# Patient Record
Sex: Female | Born: 1985
Health system: Southern US, Community
[De-identification: ages and names within clinical notes are randomized; demographics above are authoritative.]

## PROBLEM LIST (undated history)

## (undated) ENCOUNTER — Inpatient Hospital Stay (HOSPITAL_COMMUNITY): Payer: Self-pay

## (undated) DIAGNOSIS — R87629 Unspecified abnormal cytological findings in specimens from vagina: Secondary | ICD-10-CM

## (undated) DIAGNOSIS — K5900 Constipation, unspecified: Secondary | ICD-10-CM

## (undated) DIAGNOSIS — E119 Type 2 diabetes mellitus without complications: Secondary | ICD-10-CM

## (undated) DIAGNOSIS — F419 Anxiety disorder, unspecified: Secondary | ICD-10-CM

## (undated) DIAGNOSIS — G8929 Other chronic pain: Secondary | ICD-10-CM

## (undated) DIAGNOSIS — F32A Depression, unspecified: Secondary | ICD-10-CM

## (undated) DIAGNOSIS — E282 Polycystic ovarian syndrome: Secondary | ICD-10-CM

## (undated) DIAGNOSIS — O24419 Gestational diabetes mellitus in pregnancy, unspecified control: Secondary | ICD-10-CM

## (undated) DIAGNOSIS — M2669 Other specified disorders of temporomandibular joint: Secondary | ICD-10-CM

## (undated) DIAGNOSIS — K8020 Calculus of gallbladder without cholecystitis without obstruction: Secondary | ICD-10-CM

## (undated) DIAGNOSIS — N812 Incomplete uterovaginal prolapse: Secondary | ICD-10-CM

## (undated) DIAGNOSIS — R519 Headache, unspecified: Secondary | ICD-10-CM

## (undated) DIAGNOSIS — F329 Major depressive disorder, single episode, unspecified: Secondary | ICD-10-CM

## (undated) DIAGNOSIS — N83209 Unspecified ovarian cyst, unspecified side: Secondary | ICD-10-CM

## (undated) DIAGNOSIS — R51 Headache: Secondary | ICD-10-CM

## (undated) DIAGNOSIS — L0591 Pilonidal cyst without abscess: Secondary | ICD-10-CM

## (undated) DIAGNOSIS — T8859XA Other complications of anesthesia, initial encounter: Secondary | ICD-10-CM

## (undated) DIAGNOSIS — B977 Papillomavirus as the cause of diseases classified elsewhere: Secondary | ICD-10-CM

## (undated) DIAGNOSIS — T4145XA Adverse effect of unspecified anesthetic, initial encounter: Secondary | ICD-10-CM

## (undated) DIAGNOSIS — K219 Gastro-esophageal reflux disease without esophagitis: Secondary | ICD-10-CM

## (undated) DIAGNOSIS — J302 Other seasonal allergic rhinitis: Secondary | ICD-10-CM

## (undated) HISTORY — DX: Depression, unspecified: F32.A

## (undated) HISTORY — DX: Headache: R51

## (undated) HISTORY — PX: WISDOM TOOTH EXTRACTION: SHX21

## (undated) HISTORY — DX: Papillomavirus as the cause of diseases classified elsewhere: B97.7

## (undated) HISTORY — DX: Major depressive disorder, single episode, unspecified: F32.9

## (undated) HISTORY — DX: Headache, unspecified: R51.9

## (undated) HISTORY — DX: Other chronic pain: G89.29

## (undated) HISTORY — DX: Gestational diabetes mellitus in pregnancy, unspecified control: O24.419

## (undated) HISTORY — DX: Other complications of anesthesia, initial encounter: T88.59XA

## (undated) HISTORY — DX: Unspecified abnormal cytological findings in specimens from vagina: R87.629

## (undated) HISTORY — DX: Adverse effect of unspecified anesthetic, initial encounter: T41.45XA

---
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
## 2007-04-19 ENCOUNTER — Emergency Department (HOSPITAL_COMMUNITY): Admission: EM | Admit: 2007-04-19 | Discharge: 2007-04-19 | Payer: Self-pay | Admitting: Emergency Medicine

## 2007-04-22 ENCOUNTER — Emergency Department (HOSPITAL_COMMUNITY): Admission: EM | Admit: 2007-04-22 | Discharge: 2007-04-22 | Payer: Self-pay | Admitting: Emergency Medicine

## 2007-09-20 ENCOUNTER — Emergency Department (HOSPITAL_COMMUNITY): Admission: EM | Admit: 2007-09-20 | Discharge: 2007-09-20 | Payer: Self-pay | Admitting: Emergency Medicine

## 2008-09-14 ENCOUNTER — Other Ambulatory Visit: Admission: RE | Admit: 2008-09-14 | Discharge: 2008-09-14 | Payer: Self-pay | Admitting: Family Medicine

## 2009-09-19 ENCOUNTER — Other Ambulatory Visit: Admission: RE | Admit: 2009-09-19 | Discharge: 2009-09-19 | Payer: Self-pay | Admitting: Family Medicine

## 2010-11-12 ENCOUNTER — Emergency Department (HOSPITAL_COMMUNITY): Payer: Worker's Compensation

## 2010-11-12 ENCOUNTER — Emergency Department (HOSPITAL_COMMUNITY)
Admission: EM | Admit: 2010-11-12 | Discharge: 2010-11-12 | Disposition: A | Discharge status: Home / Self Care | Payer: Worker's Compensation | Attending: Emergency Medicine | Admitting: Emergency Medicine

## 2010-11-12 DIAGNOSIS — M25519 Pain in unspecified shoulder: Secondary | ICD-10-CM | POA: Insufficient documentation

## 2010-11-12 DIAGNOSIS — Y9210 Unspecified residential institution as the place of occurrence of the external cause: Secondary | ICD-10-CM | POA: Insufficient documentation

## 2010-11-12 DIAGNOSIS — X500XXA Overexertion from strenuous movement or load, initial encounter: Secondary | ICD-10-CM | POA: Insufficient documentation

## 2010-11-12 DIAGNOSIS — Y990 Civilian activity done for income or pay: Secondary | ICD-10-CM | POA: Insufficient documentation

## 2010-11-12 DIAGNOSIS — IMO0002 Reserved for concepts with insufficient information to code with codable children: Secondary | ICD-10-CM | POA: Insufficient documentation

## 2011-11-18 ENCOUNTER — Encounter (HOSPITAL_COMMUNITY): Payer: Self-pay | Admitting: Emergency Medicine

## 2011-11-18 ENCOUNTER — Emergency Department (HOSPITAL_COMMUNITY)
Admission: EM | Admit: 2011-11-18 | Discharge: 2011-11-18 | Disposition: A | Discharge status: Home / Self Care | Payer: Managed Care, Other (non HMO) | Attending: Emergency Medicine | Admitting: Emergency Medicine

## 2011-11-18 DIAGNOSIS — H6980 Other specified disorders of Eustachian tube, unspecified ear: Secondary | ICD-10-CM

## 2011-11-18 DIAGNOSIS — H6990 Unspecified Eustachian tube disorder, unspecified ear: Secondary | ICD-10-CM | POA: Insufficient documentation

## 2011-11-18 DIAGNOSIS — H9190 Unspecified hearing loss, unspecified ear: Secondary | ICD-10-CM | POA: Insufficient documentation

## 2011-11-18 DIAGNOSIS — F17200 Nicotine dependence, unspecified, uncomplicated: Secondary | ICD-10-CM | POA: Insufficient documentation

## 2011-11-18 MED ORDER — PREDNISONE 50 MG PO TABS: 50.0000 mg | ORAL_TABLET | Freq: Every day | ORAL | Status: AC

## 2011-11-18 MED ORDER — BUDESONIDE 32 MCG/ACT NA SUSP: 2.0000 | Freq: Every day | NASAL | Status: DC

## 2011-11-18 MED ORDER — FEXOFENADINE-PSEUDOEPHED ER 60-120 MG PO TB12: 1.0000 | ORAL_TABLET | Freq: Two times a day (BID) | ORAL | Status: DC

## 2011-11-18 NOTE — Discharge Instructions (Signed)
Increase her fluid intake. followup with the ENT doctor as needed.

## 2011-11-18 NOTE — ED Notes (Signed)
Loss of hearing right ear x 2 wks, and left ear is now painful and able to only hear an echo, onset couple days ago.

## 2011-11-18 NOTE — ED Provider Notes (Signed)
History     CSN: [id number]  Arrival date & time 11/18/11  1111   First MD Initiated Contact with Patient 11/18/11 1144      Chief Complaint  Patient presents with  . Hearing Loss    (Consider location/radiation/quality/duration/timing/severity/associated sxs/prior treatment) HPI The patient has emergency department with decreased hearing in her right ear x2 weeks.  Patient, states that now her left ear starting to bother her somewhat she feels, like that the hearing is decreased in that ear as well.  Patient, states, that she has not had a cough, runny nose, sore throat, fever, nausea, vomiting, dizziness, or facial weakness.  Patient, states, that she has not tried any medications for this.  Patient does take Zyrtec for allergies.  History reviewed. No pertinent past medical history.  History reviewed. No pertinent past surgical history.  No family history on file.  History  Substance Use Topics  . Smoking status: Current Some Day Smoker  . Smokeless tobacco: Never Used  . Alcohol Use: No    OB History    Grav Para Term Preterm Abortions TAB SAB Ect Mult Living                  Review of Systems All other systems negative except as documented in the HPI. All pertinent positives and negatives as reviewed in the HPI.  Allergies  Review of patient's allergies indicates no known allergies.  Home Medications   Current Outpatient Rx  Name Route Sig Dispense Refill  . CETIRIZINE HCL 10 MG PO TABS Oral Take 10 mg by mouth daily as needed. For allergies.      BP 121/70  Pulse 98  Temp 98.5 F (36.9 C) (Oral)  Resp 18  SpO2 100%  Physical Exam  Nursing note and vitals reviewed. Constitutional: She is oriented to person, place, and time. She appears well-developed and well-nourished. No distress.  HENT:  Right Ear: External ear normal. Tympanic membrane is not injected, not scarred, not perforated, not erythematous, not retracted and not bulging. A middle ear  effusion is present.  Left Ear: External ear normal. Tympanic membrane is not injected, not scarred, not perforated, not erythematous, not retracted and not bulging. A middle ear effusion is present.  Mouth/Throat: Oropharynx is clear and moist.  Eyes: Pupils are equal, round, and reactive to light.  Neck: Normal range of motion. Neck supple.  Cardiovascular: Normal rate and regular rhythm.   Pulmonary/Chest: Effort normal and breath sounds normal. No respiratory distress.  Lymphadenopathy:    She has no cervical adenopathy.  Neurological: She is alert and oriented to person, place, and time.  Skin: Skin is warm and dry. No rash noted.    ED Course  Procedures (including critical care time)  Feel that the patient has eustachian tube dysfunction, based on her history of present illness and physical exam.  Gross hearing is intact.  Patient will be referred to ENT as needed.  She is told to return to the emergency department as needed.  We will give her treatment for this.  MDM          Carlyle Dolly, PA-C 11/18/11 1213

## 2011-11-18 NOTE — ED Provider Notes (Signed)
Medical screening examination/treatment/procedure(s) were performed by non-physician practitioner and as supervising physician I was immediately available for consultation/collaboration.   Dublin Cantero, MD 11/18/11 1455 

## 2011-12-25 ENCOUNTER — Emergency Department (HOSPITAL_COMMUNITY)
Admission: EM | Admit: 2011-12-25 | Discharge: 2011-12-25 | Disposition: A | Discharge status: Home / Self Care | Payer: Managed Care, Other (non HMO) | Attending: Emergency Medicine | Admitting: Emergency Medicine

## 2011-12-25 ENCOUNTER — Emergency Department (HOSPITAL_COMMUNITY): Payer: Managed Care, Other (non HMO)

## 2011-12-25 DIAGNOSIS — L0291 Cutaneous abscess, unspecified: Secondary | ICD-10-CM | POA: Insufficient documentation

## 2011-12-25 DIAGNOSIS — L0390 Cellulitis, unspecified: Secondary | ICD-10-CM

## 2011-12-25 DIAGNOSIS — R109 Unspecified abdominal pain: Secondary | ICD-10-CM | POA: Insufficient documentation

## 2011-12-25 LAB — POCT I-STAT, CHEM 8
BUN: 7 mg/dL (ref 6–23)
Calcium, Ion: 1.21 mmol/L (ref 1.12–1.23)
Chloride: 107 mEq/L (ref 96–112)
Glucose, Bld: 83 mg/dL (ref 70–99)

## 2011-12-25 MED ORDER — IOHEXOL 300 MG/ML  SOLN
100.0000 mL | Freq: Once | INTRAMUSCULAR | Status: AC | PRN
  Administered 2011-12-25: 100 mL via INTRAVENOUS

## 2011-12-25 MED ORDER — OXYCODONE-ACETAMINOPHEN 5-325 MG PO TABS
2.0000 | ORAL_TABLET | Freq: Once | ORAL | Status: AC
  Administered 2011-12-25: 2 via ORAL
  Filled 2011-12-25: qty 2

## 2011-12-25 MED ORDER — CEFAZOLIN SODIUM 1-5 GM-% IV SOLN
1.0000 g | Freq: Once | INTRAVENOUS | Status: AC
  Administered 2011-12-25: 1 g via INTRAVENOUS
  Filled 2011-12-25: qty 50

## 2011-12-25 MED ORDER — CEPHALEXIN 500 MG PO CAPS: 500.0000 mg | ORAL_CAPSULE | Freq: Four times a day (QID) | ORAL | Status: AC

## 2011-12-25 MED ORDER — OXYCODONE-ACETAMINOPHEN 5-325 MG PO TABS: ORAL_TABLET | ORAL | Status: DC

## 2011-12-25 NOTE — ED Provider Notes (Signed)
History     CSN: [id number]  Arrival date & time 12/25/11  0941   First MD Initiated Contact with Patient 12/25/11 1016      Chief Complaint  Patient presents with  . Abdominal Pain    (Consider location/radiation/quality/duration/timing/severity/associated sxs/prior treatment) HPI  26 year old female in no acute distress complaining of pain is to gluteal cleft over the course of the month pain receded first week of the month and worsened over the course of the last several days.  She states the skin is hardened over the gluteal cleft she pressed on the area earlier in the month and clear fluid was produced. Patient denies prior episodes or fever.  No past medical history on file.  No past surgical history on file.  No family history on file.  History  Substance Use Topics  . Smoking status: Current Some Day Smoker  . Smokeless tobacco: Never Used  . Alcohol Use: No    OB History    Grav Para Term Preterm Abortions TAB SAB Ect Mult Living                  Review of Systems  Constitutional: Negative for fever.  All other systems reviewed and are negative.    Allergies  Orange syrup  Home Medications   Current Outpatient Rx  Name Route Sig Dispense Refill  . CETIRIZINE HCL 10 MG PO TABS Oral Take 10 mg by mouth daily as needed. For allergies.    Marland Kitchen FEXOFENADINE-PSEUDOEPHED ER 60-120 MG PO TB12 Oral Take 1 tablet by mouth every 12 (twelve) hours. 30 tablet 0  . BUDESONIDE 32 MCG/ACT NA SUSP Nasal Place 2 sprays into the nose daily. 1 Bottle 0    BP 111/75  Pulse 102  Temp 98.7 F (37.1 C) (Oral)  Resp 18  SpO2 99%  Physical Exam  Nursing note and vitals reviewed. Constitutional: She is oriented to person, place, and time. She appears well-developed and well-nourished. No distress.  HENT:  Head: Normocephalic.  Eyes: Conjunctivae and EOM are normal.  Cardiovascular: Normal rate.   Pulmonary/Chest: Effort normal.  Abdominal: Soft. Bowel sounds are  normal. She exhibits no distension and no mass. There is no tenderness. There is no rebound and no guarding.  Musculoskeletal: Normal range of motion.  Neurological: She is alert and oriented to person, place, and time.  Skin:       Patient is very tender to palpation of the upper gluteal cleft no fluctuance appreciated, mild induration to the left side of cleft.  Psychiatric: She has a normal mood and affect.    ED Course  Procedures (including critical care time)   Labs Reviewed  POCT I-STAT, CHEM 8  POCT PREGNANCY, URINE   Ct Pelvis W Contrast  12/25/2011  *RADIOLOGY REPORT*  Clinical Data:  Gluteal cleft pain for 1 month.  CT PELVIS WITH CONTRAST  Technique:  Multidetector CT imaging of the pelvis was performed using the standard protocol following the bolus administration of intravenous contrast.  Contrast: OMNIPAQUE IOHEXOL 300 MG/ML  SOLN  Comparison:   None.  Findings:  Soft tissue stranding in the subcutaneous fat at the level of the superior aspect of the gluteal cleft.  This area measures 7.6 x 4.9 cm in maximum dimensions on image number 16 and approximately 9 cm in length on sagittal image number 55.  This is slightly more confluent just inferior to the top of the cleft without a well-defined fluid collection.  The uterus, ovaries, urinary  bladder and bowel loops are unremarkable.  No free peritoneal fluid.  Normal appearing bones.  IMPRESSION: Findings compatible with cellulitis in the subcutaneous fat at the superior aspect of the gluteal cleft without a well-defined abscess.  Original Report Authenticated By: Darrol Angel, M.D.     1. Cellulitis       MDM  Note the patient has not had any abdominal pain and contrast chief complaint. Patient presents for pain to the gluteal cleft. No abscess or induration appreciated on physical exam however patient is very tender to palpation of the area. I'll send for CT scan with IV contrast to evaluate for deeper abscess.  CT  scan shows findings consistent with cellulitis in the subcutaneous fat of the affected area. I will d/c her with keflex and percocet and strict return precautions   Shared visit with attending Dr. Tarri Abernethy.     Wynetta Emery, PA-C 12/25/11 1130  Hanz Winterhalter, PA-C 12/25/11 1553

## 2011-12-25 NOTE — ED Provider Notes (Signed)
Medical screening examination/treatment/procedure(s) were conducted as a shared visit with non-physician practitioner(s) and myself.  I personally evaluated the patient during the encounter  Doug Sou, MD 12/25/11 [phone]

## 2011-12-25 NOTE — ED Notes (Signed)
Patient transported to CT 

## 2011-12-25 NOTE — ED Notes (Signed)
The initial assesment is placed on the wrong person this pt is here for abd pain and cyst pain.

## 2011-12-25 NOTE — Progress Notes (Signed)
Pt confirms she had been with [facility] since 2010 but recently informed she was dismissed from practice for missed appointments She was referred to [facility] and has an appointment coming up soon but can not recall the name of the provider.  CM and pt reviewed how to make sure provider is in network with her insurance carrier Counselling psychologist)

## 2011-12-25 NOTE — ED Notes (Signed)
Pt returned from CT °

## 2011-12-25 NOTE — ED Notes (Signed)
Pt verifies she will not drive after percocet is given. Pt states father lives down the road and will come get her. Father's name, Donni Minor.

## 2011-12-25 NOTE — ED Provider Notes (Signed)
Complains of pain at gluteal cleft pilonidal area for approximately 4 months worse over the past 4 days pain is worse with bowel movements denies abdominal pain denies fever On exam alert nontoxic patient tender over pilonidal area no fluctuance no perirectal abscess noted abdomen obese nontender.  Doug Sou, MD 12/25/11 [phone]

## 2011-12-27 ENCOUNTER — Encounter (HOSPITAL_COMMUNITY): Payer: Self-pay | Admitting: Emergency Medicine

## 2011-12-27 ENCOUNTER — Emergency Department (HOSPITAL_COMMUNITY)
Admission: EM | Admit: 2011-12-27 | Discharge: 2011-12-27 | Disposition: A | Discharge status: Home / Self Care | Payer: Managed Care, Other (non HMO) | Attending: Emergency Medicine | Admitting: Emergency Medicine

## 2011-12-27 DIAGNOSIS — L0501 Pilonidal cyst with abscess: Secondary | ICD-10-CM

## 2011-12-27 MED ORDER — OXYCODONE-ACETAMINOPHEN 5-325 MG PO TABS: 1.0000 | ORAL_TABLET | ORAL | Status: AC | PRN

## 2011-12-27 MED ORDER — OXYCODONE-ACETAMINOPHEN 5-325 MG PO TABS
2.0000 | ORAL_TABLET | Freq: Once | ORAL | Status: AC
  Administered 2011-12-27: 2 via ORAL
  Filled 2011-12-27: qty 2

## 2011-12-27 MED ORDER — SULFAMETHOXAZOLE-TRIMETHOPRIM 800-160 MG PO TABS: 2.0000 | ORAL_TABLET | Freq: Two times a day (BID) | ORAL | Status: AC

## 2011-12-27 NOTE — ED Notes (Signed)
Pt reports a diagnosis of cellulitis in coccyx area. Increase swelling and pain today

## 2011-12-27 NOTE — ED Provider Notes (Signed)
History     CSN: [id number]  Arrival date & time 12/27/11  0940   First MD Initiated Contact with Patient 12/27/11 1007      Chief Complaint  Patient presents with  . Back Pain    Pain in coccyx, possible cellulitis    (Consider location/radiation/quality/duration/timing/severity/associated sxs/prior treatment) The history is provided by the patient.   26 year old healthy obese female presents to the emergency department with a chief complaint of worsening pain and swelling to the gluteal cleft. She notes that this pain is chronic her last several months but has worsened in the last 6 days. Associated increased swelling. She denies fever, chills, drainage from the area. Positive nausea, related to severe pain. No emesis or abdominal pain. Was seen in the emergency department for similar complaint 2 days ago and had a thorough evaluation to include a CT scan of the pelvis that demonstrated no discernible fluid collection. She was sent home on Keflex for cellulitis.  History reviewed. No pertinent past medical history.  Past Surgical History  Procedure Date  . Dental surgery     Family History  Problem Relation Age of Onset  . Hypertension Mother   . Cancer Mother   . Hyperlipidemia Mother   . Diabetes Mother   . Thyroid disease Mother   . Cancer Father   . Diabetes Father   . Hypertension Father     History  Substance Use Topics  . Smoking status: Current Some Day Smoker    Types: Cigarettes  . Smokeless tobacco: Never Used  . Alcohol Use: No     Review of Systems Pertinent positives and negatives are reviewed in the history of present illness. Allergies  Orange syrup  Home Medications   Current Outpatient Rx  Name Route Sig Dispense Refill  . CEPHALEXIN 500 MG PO CAPS Oral Take 1 capsule (500 mg total) by mouth 4 (four) times daily. 20 capsule 0  . FEXOFENADINE-PSEUDOEPHED ER 60-120 MG PO TB12 Oral Take 1 tablet by mouth every 12 (twelve) hours.    .  OXYCODONE-ACETAMINOPHEN 5-325 MG PO TABS Oral Take 1 tablet by mouth every 6 (six) hours as needed. pain      BP 118/71  Pulse 99  Temp 98.2 F (36.8 C) (Oral)  Resp 18  SpO2 100%  Physical Exam  Nursing note reviewed. Constitutional: She appears well-developed and well-nourished.       Signs reviewed and are normal. Specifically, the patient is afebrile. She is uncomfortable appearing.  HENT:  Head: Normocephalic and atraumatic.       Oromucosa moist  Eyes: Conjunctivae are normal.  Neck: Neck supple.  Cardiovascular: Normal rate and regular rhythm.   Pulmonary/Chest: Effort normal. No respiratory distress.  Abdominal: Soft. Bowel sounds are normal. She exhibits no distension. There is no tenderness. There is no rebound and no guarding.  Musculoskeletal: She exhibits no edema.       No pain to palpation of the entire midline spine.  Neurological: She is alert.  Skin: Skin is warm and dry.     Psychiatric: She has a normal mood and affect.    ED Course  INCISION AND DRAINAGE Performed by: Lorenz Coaster JUSTICE Authorized by: Shaaron Adler Consent: Verbal consent obtained. Risks and benefits: risks, benefits and alternatives were discussed Consent given by: patient Patient understanding: patient states understanding of the procedure being performed Patient identity confirmed: verbally with patient Time out: Immediately prior to procedure a "time out" was called to verify the correct  patient, procedure, equipment, support staff and site/side marked as required. Type: pilonidal cyst Location: gluteal cleft. Anesthesia: local infiltration Local anesthetic: lidocaine 2% with epinephrine Anesthetic total: 1 ml Patient sedated: no Incision type: single straight Complexity: complex Drainage: purulent Drainage amount: copious Wound treatment: wound left open Packing material: 1/4 in iodoform gauze Patient tolerance: Patient tolerated the procedure well with  no immediate complications.   (including critical care time)  Labs Reviewed - No data to display Ct Pelvis W Contrast  12/25/2011  *RADIOLOGY REPORT*  Clinical Data:  Gluteal cleft pain for 1 month.  CT PELVIS WITH CONTRAST  Technique:  Multidetector CT imaging of the pelvis was performed using the standard protocol following the bolus administration of intravenous contrast.  Contrast: OMNIPAQUE IOHEXOL 300 MG/ML  SOLN  Comparison:   None.  Findings:  Soft tissue stranding in the subcutaneous fat at the level of the superior aspect of the gluteal cleft.  This area measures 7.6 x 4.9 cm in maximum dimensions on image number 16 and approximately 9 cm in length on sagittal image number 55.  This is slightly more confluent just inferior to the top of the cleft without a well-defined fluid collection.  The uterus, ovaries, urinary bladder and bowel loops are unremarkable.  No free peritoneal fluid.  Normal appearing bones.  IMPRESSION: Findings compatible with cellulitis in the subcutaneous fat at the superior aspect of the gluteal cleft without a well-defined abscess.  Original Report Authenticated By: Darrol Angel, M.D.     1. Pilonidal cyst with abscess       MDM  Cellulitis dx 2 days ago, worsened pain and swelling with PE evidence of abscess today. Suspect pilonidal cyst for several months with recent infection. Drained, packing in place. Will add MRSA coverage. 2 day recheck, return precautions discussed.        Shaaron Adler, PA-C 12/27/11 1139
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
## 2011-12-29 ENCOUNTER — Encounter (HOSPITAL_COMMUNITY): Payer: Self-pay | Admitting: *Deleted

## 2011-12-29 ENCOUNTER — Emergency Department (HOSPITAL_COMMUNITY)
Admission: EM | Admit: 2011-12-29 | Discharge: 2011-12-29 | Disposition: A | Discharge status: Home / Self Care | Payer: Managed Care, Other (non HMO) | Attending: Emergency Medicine | Admitting: Emergency Medicine

## 2011-12-29 DIAGNOSIS — Z4800 Encounter for change or removal of nonsurgical wound dressing: Secondary | ICD-10-CM | POA: Insufficient documentation

## 2011-12-29 NOTE — ED Provider Notes (Signed)
History     CSN: [id number]  Arrival date & time 12/29/11  0914   First MD Initiated Contact with Patient 12/29/11 [phone]      Chief Complaint  Patient presents with  . Wound Check     HPI Pt presents to the ED for a wound check.  She had buttock abscess I&D 2 days ago.  Pt is feeling much better.  She is here to have the packing removed.  NO fevers. History reviewed. No pertinent past medical history.  Past Surgical History  Procedure Date  . Dental surgery     Family History  Problem Relation Age of Onset  . Hypertension Mother   . Cancer Mother   . Hyperlipidemia Mother   . Diabetes Mother   . Thyroid disease Mother   . Cancer Father   . Diabetes Father   . Hypertension Father     History  Substance Use Topics  . Smoking status: Current Some Day Smoker    Types: Cigarettes  . Smokeless tobacco: Never Used  . Alcohol Use: No    OB History    Grav Para Term Preterm Abortions TAB SAB Ect Mult Living                  Review of Systems  Constitutional: Negative for fever.    Allergies  Orange syrup  Home Medications   Current Outpatient Rx  Name Route Sig Dispense Refill  . CEPHALEXIN 500 MG PO CAPS Oral Take 1 capsule (500 mg total) by mouth 4 (four) times daily. 20 capsule 0  . FEXOFENADINE-PSEUDOEPHED ER 60-120 MG PO TB12 Oral Take 1 tablet by mouth 2 (two) times daily as needed. For allergies.    . OXYCODONE-ACETAMINOPHEN 5-325 MG PO TABS Oral Take 1 tablet by mouth every 4 (four) hours as needed. pain 12 tablet 0  . SULFAMETHOXAZOLE-TRIMETHOPRIM 800-160 MG PO TABS Oral Take 2 tablets by mouth every 12 (twelve) hours. 28 tablet 0    BP 130/71  Pulse 95  Temp 98.3 F (36.8 C) (Oral)  Resp 18  SpO2 100%  Physical Exam  Nursing note and vitals reviewed. Constitutional: She appears well-developed and well-nourished. No distress.  HENT:  Head: Normocephalic and atraumatic.  Right Ear: External ear normal.  Left Ear: External ear normal.  Eyes:  Conjunctivae are normal. Right eye exhibits no discharge. Left eye exhibits no discharge. No scleral icterus.  Neck: Neck supple. No tracheal deviation present.  Cardiovascular: Normal rate.   Pulmonary/Chest: Effort normal. No stridor. No respiratory distress.  Genitourinary:       Packing in place superior gluteal cleft, no erythema or drainage  Musculoskeletal: She exhibits no edema.  Neurological: She is alert. Cranial nerve deficit: no gross deficits.  Skin: Skin is warm and dry. No rash noted.  Psychiatric: She has a normal mood and affect.    ED Course  Procedures (including critical care time)  Labs Reviewed - No data to display No results found.   1. Abscess packing removal       MDM  Packing removed.  Discussed precautions.  Appears to be improving.        Celene Kras, MD 12/29/11 [phone]

## 2011-12-29 NOTE — ED Notes (Signed)
Pt states she has a abscess on her bottom and came for recheck. Drainage without odor or discoloration. No other c/o

## 2011-12-31 ENCOUNTER — Ambulatory Visit (INDEPENDENT_AMBULATORY_CARE_PROVIDER_SITE_OTHER): Payer: Managed Care, Other (non HMO) | Admitting: Internal Medicine

## 2011-12-31 ENCOUNTER — Other Ambulatory Visit (INDEPENDENT_AMBULATORY_CARE_PROVIDER_SITE_OTHER): Payer: Managed Care, Other (non HMO)

## 2011-12-31 ENCOUNTER — Encounter: Payer: Self-pay | Admitting: Internal Medicine

## 2011-12-31 VITALS — BP 108/60 | HR 87 | Temp 98.2°F | Resp 16 | Ht 62.0 in | Wt 236.0 lb

## 2011-12-31 DIAGNOSIS — Z0000 Encounter for general adult medical examination without abnormal findings: Secondary | ICD-10-CM

## 2011-12-31 DIAGNOSIS — Z23 Encounter for immunization: Secondary | ICD-10-CM

## 2011-12-31 LAB — LIPID PANEL
Cholesterol: 164 mg/dL (ref 0–200)
HDL: 40.6 mg/dL (ref >39.00)
LDL Cholesterol: 98 mg/dL (ref 0–99)
Total CHOL/HDL Ratio: 4
Triglycerides: 129 mg/dL (ref 0.0–149.0)

## 2011-12-31 NOTE — Assessment & Plan Note (Signed)
Exam done, vaccines were updated, labs ordered, pt ed material was given 

## 2011-12-31 NOTE — Progress Notes (Signed)
  Subjective:    Patient ID: Traci Peterson, female    DOB: Feb 06, 1986, 26 y.o.   MRN: [id number]  HPI New to me for a physical - she feels well and offers no complaints.   Review of Systems  Constitutional: Negative.   HENT: Negative.   Eyes: Negative.   Respiratory: Negative.   Cardiovascular: Negative.   Gastrointestinal: Negative.   Genitourinary: Negative.   Musculoskeletal: Negative.   Skin: Negative.   Neurological: Negative.   Hematological: Negative.   Psychiatric/Behavioral: Negative.        Objective:   Physical Exam  Vitals reviewed. Constitutional: She is oriented to person, place, and time. She appears well-developed and well-nourished. No distress.  HENT:  Head: Normocephalic and atraumatic.  Mouth/Throat: Oropharynx is clear and moist. No oropharyngeal exudate.  Eyes: Right eye exhibits no discharge. Left eye exhibits no discharge. No scleral icterus.  Neck: Normal range of motion. Neck supple. No JVD present. No tracheal deviation present. No thyromegaly present.  Cardiovascular: Normal rate, regular rhythm, normal heart sounds and intact distal pulses.  Exam reveals no gallop and no friction rub.   No murmur heard. Pulmonary/Chest: Effort normal and breath sounds normal. No stridor. No respiratory distress. She has no wheezes. She has no rales. She exhibits no tenderness.  Abdominal: Soft. Bowel sounds are normal. She exhibits no distension. There is no tenderness. There is no rebound and no guarding.  Musculoskeletal: Normal range of motion. She exhibits no edema and no tenderness.  Lymphadenopathy:    She has no cervical adenopathy.  Neurological: She is oriented to person, place, and time.  Skin: Skin is warm and dry. No rash noted. She is not diaphoretic. No erythema. No pallor.  Psychiatric: She has a normal mood and affect. Her behavior is normal. Judgment and thought content normal.      Lab Results  Component Value Date   HGB 13.9 12/25/2011     HCT 41.0 12/25/2011   GLUCOSE 83 12/25/2011   NA 141 12/25/2011   K 4.0 12/25/2011   CL 107 12/25/2011   CREATININE 0.80 12/25/2011   BUN 7 12/25/2011      Assessment & Plan:

## 2011-12-31 NOTE — Patient Instructions (Signed)
Preventive Care for Adults, Female A healthy lifestyle and preventive care can promote health and wellness. Preventive health guidelines for women include the following key practices.  A routine yearly physical is a good way to check with your caregiver about your health and preventive screening. It is a chance to share any concerns and updates on your health, and to receive a thorough exam.   Visit your dentist for a routine exam and preventive care every 6 months. Brush your teeth twice a day and floss once a day. Good oral hygiene prevents tooth decay and gum disease.   The frequency of eye exams is based on your age, health, family medical history, use of contact lenses, and other factors. Follow your caregiver's recommendations for frequency of eye exams.   Eat a healthy diet. Foods like vegetables, fruits, whole grains, low-fat dairy products, and lean protein foods contain the nutrients you need without too many calories. Decrease your intake of foods high in solid fats, added sugars, and salt. Eat the right amount of calories for you.Get information about a proper diet from your caregiver, if necessary.   Regular physical exercise is one of the most important things you can do for your health. Most adults should get at least 150 minutes of moderate-intensity exercise (any activity that increases your heart rate and causes you to sweat) each week. In addition, most adults need muscle-strengthening exercises on 2 or more days a week.   Maintain a healthy weight. The body mass index (BMI) is a screening tool to identify possible weight problems. It provides an estimate of body fat based on height and weight. Your caregiver can help determine your BMI, and can help you achieve or maintain a healthy weight.For adults 20 years and older:   A BMI below 18.5 is considered underweight.   A BMI of 18.5 to 24.9 is normal.   A BMI of 25 to 29.9 is considered overweight.   A BMI of 30 and above is  considered obese.   Maintain normal blood lipids and cholesterol levels by exercising and minimizing your intake of saturated fat. Eat a balanced diet with plenty of fruit and vegetables. Blood tests for lipids and cholesterol should begin at age 20 and be repeated every 5 years. If your lipid or cholesterol levels are high, you are over 50, or you are at high risk for heart disease, you may need your cholesterol levels checked more frequently.Ongoing high lipid and cholesterol levels should be treated with medicines if diet and exercise are not effective.   If you smoke, find out from your caregiver how to quit. If you do not use tobacco, do not start.   If you are pregnant, do not drink alcohol. If you are breastfeeding, be very cautious about drinking alcohol. If you are not pregnant and choose to drink alcohol, do not exceed 1 drink per day. One drink is considered to be 12 ounces (355 mL) of beer, 5 ounces (148 mL) of wine, or 1.5 ounces (44 mL) of liquor.   Avoid use of street drugs. Do not share needles with anyone. Ask for help if you need support or instructions about stopping the use of drugs.   High blood pressure causes heart disease and increases the risk of stroke. Your blood pressure should be checked at least every 1 to 2 years. Ongoing high blood pressure should be treated with medicines if weight loss and exercise are not effective.   If you are 55 to 24   years old, ask your caregiver if you should take aspirin to prevent strokes.   Diabetes screening involves taking a blood sample to check your fasting blood sugar level. This should be done once every 3 years, after age 45, if you are within normal weight and without risk factors for diabetes. Testing should be considered at a younger age or be carried out more frequently if you are overweight and have at least 1 risk factor for diabetes.   Breast cancer screening is essential preventive care for women. You should practice "breast  self-awareness." This means understanding the normal appearance and feel of your breasts and may include breast self-examination. Any changes detected, no matter how small, should be reported to a caregiver. Women in their 20s and 30s should have a clinical breast exam (CBE) by a caregiver as part of a regular health exam every 1 to 3 years. After age 40, women should have a CBE every year. Starting at age 40, women should consider having a mammography (breast X-ray test) every year. Women who have a family history of breast cancer should talk to their caregiver about genetic screening. Women at a high risk of breast cancer should talk to their caregivers about having magnetic resonance imaging (MRI) and a mammography every year.   The Pap test is a screening test for cervical cancer. A Pap test can show cell changes on the cervix that might become cervical cancer if left untreated. A Pap test is a procedure in which cells are obtained and examined from the lower end of the uterus (cervix).   Women should have a Pap test starting at age 21.   Between ages 21 and 29, Pap tests should be repeated every 2 years.   Beginning at age 30, you should have a Pap test every 3 years as long as the past 3 Pap tests have been normal.   Some women have medical problems that increase the chance of getting cervical cancer. Talk to your caregiver about these problems. It is especially important to talk to your caregiver if a new problem develops soon after your last Pap test. In these cases, your caregiver may recommend more frequent screening and Pap tests.   The above recommendations are the same for women who have or have not gotten the vaccine for human papillomavirus (HPV).   If you had a hysterectomy for a problem that was not cancer or a condition that could lead to cancer, then you no longer need Pap tests. Even if you no longer need a Pap test, a regular exam is a good idea to make sure no other problems are  starting.   If you are between ages 65 and 70, and you have had normal Pap tests going back 10 years, you no longer need Pap tests. Even if you no longer need a Pap test, a regular exam is a good idea to make sure no other problems are starting.   If you have had past treatment for cervical cancer or a condition that could lead to cancer, you need Pap tests and screening for cancer for at least 20 years after your treatment.   If Pap tests have been discontinued, risk factors (such as a new sexual partner) need to be reassessed to determine if screening should be resumed.   The HPV test is an additional test that may be used for cervical cancer screening. The HPV test looks for the virus that can cause the cell changes on the cervix.   The cells collected during the Pap test can be tested for HPV. The HPV test could be used to screen women aged 30 years and older, and should be used in women of any age who have unclear Pap test results. After the age of 30, women should have HPV testing at the same frequency as a Pap test.   Colorectal cancer can be detected and often prevented. Most routine colorectal cancer screening begins at the age of 50 and continues through age 75. However, your caregiver may recommend screening at an earlier age if you have risk factors for colon cancer. On a yearly basis, your caregiver may provide home test kits to check for hidden blood in the stool. Use of a small camera at the end of a tube, to directly examine the colon (sigmoidoscopy or colonoscopy), can detect the earliest forms of colorectal cancer. Talk to your caregiver about this at age 50, when routine screening begins. Direct examination of the colon should be repeated every 5 to 10 years through age 75, unless early forms of pre-cancerous polyps or small growths are found.   Hepatitis C blood testing is recommended for all people born from 1945 through 1965 and any individual with known risks for hepatitis C.    Practice safe sex. Use condoms and avoid high-risk sexual practices to reduce the spread of sexually transmitted infections (STIs). STIs include gonorrhea, chlamydia, syphilis, trichomonas, herpes, HPV, and human immunodeficiency virus (HIV). Herpes, HIV, and HPV are viral illnesses that have no cure. They can result in disability, cancer, and death. Sexually active women aged 25 and younger should be checked for chlamydia. Older women with new or multiple partners should also be tested for chlamydia. Testing for other STIs is recommended if you are sexually active and at increased risk.   Osteoporosis is a disease in which the bones lose minerals and strength with aging. This can result in serious bone fractures. The risk of osteoporosis can be identified using a bone density scan. Women ages 65 and over and women at risk for fractures or osteoporosis should discuss screening with their caregivers. Ask your caregiver whether you should take a calcium supplement or vitamin D to reduce the rate of osteoporosis.   Menopause can be associated with physical symptoms and risks. Hormone replacement therapy is available to decrease symptoms and risks. You should talk to your caregiver about whether hormone replacement therapy is right for you.   Use sunscreen with sun protection factor (SPF) of 30 or more. Apply sunscreen liberally and repeatedly throughout the day. You should seek shade when your shadow is shorter than you. Protect yourself by wearing long sleeves, pants, a wide-brimmed hat, and sunglasses year round, whenever you are outdoors.   Once a month, do a whole body skin exam, using a mirror to look at the skin on your back. Notify your caregiver of new moles, moles that have irregular borders, moles that are larger than a pencil eraser, or moles that have changed in shape or color.   Stay current with required immunizations.   Influenza. You need a dose every fall (or winter). The composition of  the flu vaccine changes each year, so being vaccinated once is not enough.   Pneumococcal polysaccharide. You need 1 to 2 doses if you smoke cigarettes or if you have certain chronic medical conditions. You need 1 dose at age 65 (or older) if you have never been vaccinated.   Tetanus, diphtheria, pertussis (Tdap, Td). Get 1 dose of   Tdap vaccine if you are younger than age 65, are over 65 and have contact with an infant, are a healthcare worker, are pregnant, or simply want to be protected from whooping cough. After that, you need a Td booster dose every 10 years. Consult your caregiver if you have not had at least 3 tetanus and diphtheria-containing shots sometime in your life or have a deep or dirty wound.   HPV. You need this vaccine if you are a woman age 26 or younger. The vaccine is given in 3 doses over 6 months.   Measles, mumps, rubella (MMR). You need at least 1 dose of MMR if you were born in 1957 or later. You may also need a second dose.   Meningococcal. If you are age 19 to 21 and a first-year college student living in a residence hall, or have one of several medical conditions, you need to get vaccinated against meningococcal disease. You may also need additional booster doses.   Zoster (shingles). If you are age 60 or older, you should get this vaccine.   Varicella (chickenpox). If you have never had chickenpox or you were vaccinated but received only 1 dose, talk to your caregiver to find out if you need this vaccine.   Hepatitis A. You need this vaccine if you have a specific risk factor for hepatitis A virus infection or you simply wish to be protected from this disease. The vaccine is usually given as 2 doses, 6 to 18 months apart.   Hepatitis B. You need this vaccine if you have a specific risk factor for hepatitis B virus infection or you simply wish to be protected from this disease. The vaccine is given in 3 doses, usually over 6 months.  Preventive Services /  Frequency Ages 19 to 39  Blood pressure check.** / Every 1 to 2 years.   Lipid and cholesterol check.** / Every 5 years beginning at age 20.   Clinical breast exam.** / Every 3 years for women in their 20s and 30s.   Pap test.** / Every 2 years from ages 21 through 29. Every 3 years starting at age 30 through age 65 or 70 with a history of 3 consecutive normal Pap tests.   HPV screening.** / Every 3 years from ages 30 through ages 65 to 70 with a history of 3 consecutive normal Pap tests.   Hepatitis C blood test.** / For any individual with known risks for hepatitis C.   Skin self-exam. / Monthly.   Influenza immunization.** / Every year.   Pneumococcal polysaccharide immunization.** / 1 to 2 doses if you smoke cigarettes or if you have certain chronic medical conditions.   Tetanus, diphtheria, pertussis (Tdap, Td) immunization. / A one-time dose of Tdap vaccine. After that, you need a Td booster dose every 10 years.   HPV immunization. / 3 doses over 6 months, if you are 26 and younger.   Measles, mumps, rubella (MMR) immunization. / You need at least 1 dose of MMR if you were born in 1957 or later. You may also need a second dose.   Meningococcal immunization. / 1 dose if you are age 19 to 21 and a first-year college student living in a residence hall, or have one of several medical conditions, you need to get vaccinated against meningococcal disease. You may also need additional booster doses.   Varicella immunization.** / Consult your caregiver.   Hepatitis A immunization.** / Consult your caregiver. 2 doses, 6 to 18 months   apart.   Hepatitis B immunization.** / Consult your caregiver. 3 doses usually over 6 months.  Ages 40 to 64  Blood pressure check.** / Every 1 to 2 years.   Lipid and cholesterol check.** / Every 5 years beginning at age 20.   Clinical breast exam.** / Every year after age 40.   Mammogram.** / Every year beginning at age 40 and continuing for as  long as you are in good health. Consult with your caregiver.   Pap test.** / Every 3 years starting at age 30 through age 65 or 70 with a history of 3 consecutive normal Pap tests.   HPV screening.** / Every 3 years from ages 30 through ages 65 to 70 with a history of 3 consecutive normal Pap tests.   Fecal occult blood test (FOBT) of stool. / Every year beginning at age 50 and continuing until age 75. You may not need to do this test if you get a colonoscopy every 10 years.   Flexible sigmoidoscopy or colonoscopy.** / Every 5 years for a flexible sigmoidoscopy or every 10 years for a colonoscopy beginning at age 50 and continuing until age 75.   Hepatitis C blood test.** / For all people born from 1945 through 1965 and any individual with known risks for hepatitis C.   Skin self-exam. / Monthly.   Influenza immunization.** / Every year.   Pneumococcal polysaccharide immunization.** / 1 to 2 doses if you smoke cigarettes or if you have certain chronic medical conditions.   Tetanus, diphtheria, pertussis (Tdap, Td) immunization.** / A one-time dose of Tdap vaccine. After that, you need a Td booster dose every 10 years.   Measles, mumps, rubella (MMR) immunization. / You need at least 1 dose of MMR if you were born in 1957 or later. You may also need a second dose.   Varicella immunization.** / Consult your caregiver.   Meningococcal immunization.** / Consult your caregiver.   Hepatitis A immunization.** / Consult your caregiver. 2 doses, 6 to 18 months apart.   Hepatitis B immunization.** / Consult your caregiver. 3 doses, usually over 6 months.  Ages 65 and over  Blood pressure check.** / Every 1 to 2 years.   Lipid and cholesterol check.** / Every 5 years beginning at age 20.   Clinical breast exam.** / Every year after age 40.   Mammogram.** / Every year beginning at age 40 and continuing for as long as you are in good health. Consult with your caregiver.   Pap test.** /  Every 3 years starting at age 30 through age 65 or 70 with a 3 consecutive normal Pap tests. Testing can be stopped between 65 and 70 with 3 consecutive normal Pap tests and no abnormal Pap or HPV tests in the past 10 years.   HPV screening.** / Every 3 years from ages 30 through ages 65 or 70 with a history of 3 consecutive normal Pap tests. Testing can be stopped between 65 and 70 with 3 consecutive normal Pap tests and no abnormal Pap or HPV tests in the past 10 years.   Fecal occult blood test (FOBT) of stool. / Every year beginning at age 50 and continuing until age 75. You may not need to do this test if you get a colonoscopy every 10 years.   Flexible sigmoidoscopy or colonoscopy.** / Every 5 years for a flexible sigmoidoscopy or every 10 years for a colonoscopy beginning at age 50 and continuing until age 75.   Hepatitis   C blood test.** / For all people born from 1945 through 1965 and any individual with known risks for hepatitis C.   Osteoporosis screening.** / A one-time screening for women ages 65 and over and women at risk for fractures or osteoporosis.   Skin self-exam. / Monthly.   Influenza immunization.** / Every year.   Pneumococcal polysaccharide immunization.** / 1 dose at age 65 (or older) if you have never been vaccinated.   Tetanus, diphtheria, pertussis (Tdap, Td) immunization. / A one-time dose of Tdap vaccine if you are over 65 and have contact with an infant, are a healthcare worker, or simply want to be protected from whooping cough. After that, you need a Td booster dose every 10 years.   Varicella immunization.** / Consult your caregiver.   Meningococcal immunization.** / Consult your caregiver.   Hepatitis A immunization.** / Consult your caregiver. 2 doses, 6 to 18 months apart.   Hepatitis B immunization.** / Check with your caregiver. 3 doses, usually over 6 months.  ** Family history and personal history of risk and conditions may change your caregiver's  recommendations. Document Released: 07/10/2001 Document Revised: 05/03/2011 Document Reviewed: 10/09/2010 [employer] Patient Information 2012 [employer]. 

## 2012-02-23 ENCOUNTER — Encounter (HOSPITAL_COMMUNITY): Payer: Self-pay | Admitting: Emergency Medicine

## 2012-02-23 ENCOUNTER — Emergency Department (HOSPITAL_COMMUNITY)
Admission: EM | Admit: 2012-02-23 | Discharge: 2012-02-23 | Disposition: A | Discharge status: Home / Self Care | Payer: Managed Care, Other (non HMO) | Attending: Emergency Medicine | Admitting: Emergency Medicine

## 2012-02-23 DIAGNOSIS — F3289 Other specified depressive episodes: Secondary | ICD-10-CM | POA: Insufficient documentation

## 2012-02-23 DIAGNOSIS — J111 Influenza due to unidentified influenza virus with other respiratory manifestations: Secondary | ICD-10-CM | POA: Insufficient documentation

## 2012-02-23 DIAGNOSIS — F17200 Nicotine dependence, unspecified, uncomplicated: Secondary | ICD-10-CM | POA: Insufficient documentation

## 2012-02-23 DIAGNOSIS — F329 Major depressive disorder, single episode, unspecified: Secondary | ICD-10-CM | POA: Insufficient documentation

## 2012-02-23 LAB — POCT I-STAT, CHEM 8
Calcium, Ion: 1.13 mmol/L (ref 1.12–1.23)
Chloride: 106 mEq/L (ref 96–112)
Creatinine, Ser: 0.8 mg/dL (ref 0.50–1.10)
Glucose, Bld: 79 mg/dL (ref 70–99)
HCT: 44 % (ref 36.0–46.0)

## 2012-02-23 LAB — URINALYSIS, ROUTINE W REFLEX MICROSCOPIC
Bilirubin Urine: NEGATIVE
Hgb urine dipstick: NEGATIVE
Ketones, ur: NEGATIVE mg/dL
Nitrite: NEGATIVE
pH: 5.5 (ref 5.0–8.0)

## 2012-02-23 MED ORDER — ONDANSETRON HCL 4 MG PO TABS: 4.0000 mg | ORAL_TABLET | Freq: Four times a day (QID) | ORAL | Status: DC

## 2012-02-23 MED ORDER — SODIUM CHLORIDE 0.9 % IV BOLUS (SEPSIS)
1000.0000 mL | Freq: Once | INTRAVENOUS | Status: AC
  Administered 2012-02-23: 1000 mL via INTRAVENOUS

## 2012-02-23 MED ORDER — ONDANSETRON HCL 4 MG/2ML IJ SOLN
4.0000 mg | Freq: Once | INTRAMUSCULAR | Status: AC
Start: 2012-02-23 — End: 2012-02-23
  Administered 2012-02-23: 4 mg via INTRAVENOUS
  Filled 2012-02-23: qty 2

## 2012-02-23 MED ORDER — OSELTAMIVIR PHOSPHATE 75 MG PO CAPS: 75.0000 mg | ORAL_CAPSULE | Freq: Two times a day (BID) | ORAL | Status: DC

## 2012-02-23 MED ORDER — IBUPROFEN 200 MG PO TABS
600.0000 mg | ORAL_TABLET | Freq: Once | ORAL | Status: AC
  Administered 2012-02-23: 600 mg via ORAL
  Filled 2012-02-23: qty 3

## 2012-02-23 NOTE — ED Notes (Signed)
Patient is alert and oriented x3.  She was given DC instructions and follow up visit instructions.  Patient gave verbal understanding. She was DC ambulatory under his own power to home.  V/S stable.  He was not showing any signs of distress on DC 

## 2012-02-23 NOTE — ED Notes (Signed)
Onset of body aches, headache, nasal congestion is yellow, cough is non-productive. Diarrhea last p.m. Emesis today

## 2012-02-24 NOTE — ED Provider Notes (Signed)
History     CSN: [id number]  Arrival date & time 02/23/12  1456   First MD Initiated Contact with Patient 02/23/12 1742      Chief Complaint  Patient presents with  . Influenza    (Consider location/radiation/quality/duration/timing/severity/associated sxs/prior treatment) Patient is a 26 y.o. female presenting with flu symptoms. The history is provided by the patient.  Influenza This is a new problem. The current episode started yesterday. The problem has been gradually worsening. Associated symptoms include shortness of breath. Pertinent negatives include no chest pain and no abdominal pain. Nothing aggravates the symptoms. The symptoms are relieved by acetaminophen. She has tried acetaminophen and rest for the symptoms. The treatment provided no relief.    Past Medical History  Diagnosis Date  . Depression     Past Surgical History  Procedure Date  . Dental surgery     Family History  Problem Relation Age of Onset  . Hypertension Mother   . Hyperlipidemia Mother   . Diabetes Mother   . Thyroid disease Mother   . Diabetes Father   . Hypertension Father   . Alcohol abuse Father   . Cancer Neg Hx   . Early death Neg Hx   . Heart disease Neg Hx   . Kidney disease Neg Hx   . Stroke Neg Hx     History  Substance Use Topics  . Smoking status: Current Some Day Smoker -- 0.2 packs/day    Types: Cigarettes  . Smokeless tobacco: Never Used  . Alcohol Use: 0.6 oz/week    1 Glasses of wine per week    OB History    Grav Para Term Preterm Abortions TAB SAB Ect Mult Living                  Review of Systems  Respiratory: Positive for shortness of breath.   Cardiovascular: Negative for chest pain.  Gastrointestinal: Negative for abdominal pain.  All other systems reviewed and are negative.    Allergies  Orange syrup  Home Medications   Current Outpatient Rx  Name Route Sig Dispense Refill  . ETONOGESTREL 68 MG [place] IMPL Subcutaneous Inject 1 each into the  skin once.    . ETONOGESTREL 68 MG [place] IMPL Subcutaneous Inject 1 each (68 mg total) into the skin once. 1 each 0  . ONDANSETRON HCL 4 MG PO TABS Oral Take 1 tablet (4 mg total) by mouth every 6 (six) hours. 12 tablet 0  . OSELTAMIVIR PHOSPHATE 75 MG PO CAPS Oral Take 1 capsule (75 mg total) by mouth every 12 (twelve) hours. 10 capsule 0    BP 117/81  Pulse 118  Temp 99.4 F (37.4 C) (Oral)  Resp 20  SpO2 98%  LMP 01/09/2012  Physical Exam  Nursing note and vitals reviewed. Constitutional: She is oriented to person, place, and time. She appears well-developed. No distress.  HENT:  Head: Normocephalic and atraumatic.  Eyes: Pupils are equal, round, and reactive to light.  Neck: Normal range of motion.  Cardiovascular: Normal rate and intact distal pulses.   Pulmonary/Chest: No respiratory distress.  Abdominal: Normal appearance. She exhibits no distension.  Musculoskeletal: Normal range of motion.  Neurological: She is alert and oriented to person, place, and time. No cranial nerve deficit.  Skin: Skin is warm and dry. No rash noted.  Psychiatric: She has a normal mood and affect. Her behavior is normal.    ED Course  Procedures (including critical care time)  Medications  etonogestrel (  IMPLANON) 68 MG IMPL implant (not administered)  oseltamivir (TAMIFLU) 75 MG capsule (not administered)  ondansetron (ZOFRAN) 4 MG tablet (not administered)  sodium chloride 0.9 % bolus 1,000 mL (1000 mL Intravenous Given 02/23/12 1820)  ondansetron (ZOFRAN) injection 4 mg (4 mg Intravenous Given 02/23/12 1820)  ibuprofen (ADVIL,MOTRIN) tablet 600 mg (600 mg Oral Given 02/23/12 2120)    Labs Reviewed  URINALYSIS, ROUTINE W REFLEX MICROSCOPIC - Abnormal; Notable for the following:    APPearance CLOUDY (*)     Specific Gravity, Urine 1.036 (*)     All other components within normal limits  POCT I-STAT, CHEM 8 - Abnormal; Notable for the following:    BUN 5 (*)     All other components within  normal limits  LAB REPORT - SCANNED   No results found.   1. Influenza       MDM  After treatment in the ED the patient feels back to baseline and wants to go home.         Nelia Shi, MD 02/24/12 [phone]

## 2012-08-08 ENCOUNTER — Encounter: Payer: Self-pay | Admitting: Internal Medicine

## 2012-08-08 ENCOUNTER — Other Ambulatory Visit (INDEPENDENT_AMBULATORY_CARE_PROVIDER_SITE_OTHER): Payer: Managed Care, Other (non HMO)

## 2012-08-08 ENCOUNTER — Ambulatory Visit (INDEPENDENT_AMBULATORY_CARE_PROVIDER_SITE_OTHER): Payer: Managed Care, Other (non HMO) | Admitting: Internal Medicine

## 2012-08-08 ENCOUNTER — Telehealth: Payer: Self-pay | Admitting: Internal Medicine

## 2012-08-08 ENCOUNTER — Ambulatory Visit (INDEPENDENT_AMBULATORY_CARE_PROVIDER_SITE_OTHER)
Admission: RE | Admit: 2012-08-08 | Discharge: 2012-08-08 | Disposition: A | Discharge status: Home / Self Care | Payer: Managed Care, Other (non HMO) | Source: Ambulatory Visit | Attending: Internal Medicine | Admitting: Internal Medicine

## 2012-08-08 VITALS — BP 92/64 | HR 84 | Temp 98.1°F | Ht 62.0 in | Wt 239.0 lb

## 2012-08-08 DIAGNOSIS — R059 Cough, unspecified: Secondary | ICD-10-CM

## 2012-08-08 DIAGNOSIS — R05 Cough: Secondary | ICD-10-CM

## 2012-08-08 DIAGNOSIS — R112 Nausea with vomiting, unspecified: Secondary | ICD-10-CM

## 2012-08-08 LAB — CBC WITH DIFFERENTIAL/PLATELET
Basophils Relative: 0.3 % (ref 0.0–3.0)
Eosinophils Absolute: 0.1 10*3/uL (ref 0.0–0.7)
Eosinophils Relative: 2.2 % (ref 0.0–5.0)
Hemoglobin: 13.9 g/dL (ref 12.0–15.0)
Lymphocytes Relative: 37 % (ref 12.0–46.0)
Monocytes Relative: 12.2 % — ABNORMAL HIGH (ref 3.0–12.0)
Neutro Abs: 2.7 10*3/uL (ref 1.4–7.7)
Neutrophils Relative %: 48.3 % (ref 43.0–77.0)
RBC: 4.63 Mil/uL (ref 3.87–5.11)
WBC: 5.6 10*3/uL (ref 4.5–10.5)

## 2012-08-08 LAB — HEPATIC FUNCTION PANEL
ALT: 30 U/L (ref 0–35)
Albumin: 3.7 g/dL (ref 3.5–5.2)
Bilirubin, Direct: 0.1 mg/dL (ref 0.0–0.3)
Total Protein: 7.2 g/dL (ref 6.0–8.3)

## 2012-08-08 LAB — BASIC METABOLIC PANEL
BUN: 6 mg/dL (ref 6–23)
CO2: 24 mEq/L (ref 19–32)
Calcium: 8.4 mg/dL (ref 8.4–10.5)
Chloride: 107 mEq/L (ref 96–112)
Creatinine, Ser: 0.8 mg/dL (ref 0.4–1.2)
Glucose, Bld: 90 mg/dL (ref 70–99)

## 2012-08-08 MED ORDER — PROMETHAZINE HCL 25 MG PO TABS: 25.0000 mg | ORAL_TABLET | Freq: Four times a day (QID) | ORAL | Status: DC | PRN

## 2012-08-08 MED ORDER — AZITHROMYCIN 250 MG PO TABS: ORAL_TABLET | ORAL | Status: DC

## 2012-08-08 NOTE — Telephone Encounter (Signed)
[facility] Triage Call Report Triage Record Num: [id number] Operator: Cornell Barman Patient Name: Traci Peterson Call Date & Time: 08/08/2012 8:36:21AM Patient Phone: [phone] PCP: Sanda Linger Patient Gender: Female PCP Fax : Patient DOB: 1986/05/15 Practice Name: [facility] Reason for Call: Caller: Paitlyn/Patient; PCP: Sanda Linger (Adults only); CB#: [phone]; Call regarding Having Vomiting and Loose Stools. States She Has Black Stools Started This Morning.; She states she works in [employer] that is currently having GI virus. she had onset of symptoms Tuesday, 08/05/12. She reports the vomiting has stopped but has continued with loose stool. In the last 24 hours she has had 3-4X . Described loose-liquid. Color-Black this morning. She has been treating herself with Pepto Bismol. Last UOP-7:00 a.m.Marland Kitchen She is staying hydrated and drinking. Decreased appetite. Diet -gingerale, soup , greens ,water. Fluids- 1/2 glass. Abdominal discomfort- upper epigastric under bra line. Feels discomfort with deep breath. Described as constant, heavy /sharp pain. Afebrile. Emergent s/sx ruled out per diarrhea or other changes in bowel habits with the exception to "Diarrhea associated with new abdominal bloating , distention and swelling. see provider in 4 hours. Appt scheduled today 08/08/12 at 11;15 with Dr. Jonny Ruiz. Care advise and call back reviewed with patient. Understanding expressed. Protocol(s) Used: Diarrhea or Other Change in Bowel Habits Recommended Outcome per Protocol: See Provider within 4 hours Reason for Outcome: Diarrhea associated with new abdominal bloating, distension or swelling Change in character of bowel movements Care Advice: Do not take aspirin, ibuprofen, ketoprofen, naproxen, etc., or other pain relieving medications until consulting with provider. ~ IMMEDIATE ACTION Diarrheal Care: - Drink 2-3 quarts (2-3 liters) per day of low sugar content fluids, including  over the counter oral hydration solution, unless directed otherwise by provider. - If accompanied by vomiting, take the fluids in frequent small sips or suck on ice chips. - Eat easily digested foods (such as bananas, rice, applesauce, toast, cooked cereals, soup, crackers, baked or boiled potato, or baked chicken or [place] without skin). - Do not eat high fiber, high fat, high sugar content foods, or highly seasoned foods. - Do not drink caffeinated or alcoholic beverages. - Avoid milk and milk products while having symptoms. As symptoms improve, gradually add back to diet. - Application of A&D ointment or witch hazel medicated pads may help anal irritation. - Antidiarrheal medications are usually unnecessary. If symptoms are severe, consider nonprescription antidiarrheal and anti-motility drugs as directed by label or a provider. Do not take if have high fever or bloody diarrhea. If pregnant, do not take any medications not approved by your provider. - Consult your provider for advice regarding continuing prescription medication.

## 2012-08-08 NOTE — Assessment & Plan Note (Signed)
C/w uri/sinusitis, cant r/o pna,  Mild to mod, for antibx course,  to f/u any worsening symptoms or concerns, for cxr

## 2012-08-08 NOTE — Patient Instructions (Addendum)
Please take all new medication as prescribed - the antibiotic Please continue all other medications as before You can also use immodium OTC for loose stools if needed Please go to the XRAY Department in the Basement (go straight as you get off the elevator) for the x-ray testing Please go to the LAB in the Basement (turn left off the elevator) for the tests to be done today Thank you for enrolling in MyChart. Please follow the instructions below to securely access your online medical record. MyChart allows you to send messages to your doctor, view your test results, renew your prescriptions, schedule appointments, and more. To Log into My Chart online, please go by [employer] or [employer] to [email or web address], or download the MyChart App from the [employer] .  Your Username is: Alyce Pagan (pass [id number]) You are given the work note for today

## 2012-08-08 NOTE — Assessment & Plan Note (Signed)
Mild to mod, for cbc/bmet, phenergan prn,  to f/u any worsening symptoms or concerns, does not appear dry at this time

## 2012-08-08 NOTE — Progress Notes (Signed)
  Subjective:    Patient ID: Traci Peterson, female    DOB: 1985/07/09, 27 y.o.   MRN: [id number]  HPI  Here with 2-3 days acute onset fever, severe ST pain, pressure, headache, general weakness and malaise, and greenish d/c, with mild nonprod cough, also mild loose stool yesterday with episode of n/v. but pt denies chest pain, wheezing, increased sob or doe, orthopnea, PND, increased LE swelling, palpitations, dizziness or syncope.   Pt denies polydipsia, polyuria. Past Medical History  Diagnosis Date  . Depression    Past Surgical History  Procedure Laterality Date  . Dental surgery      reports that she has been smoking Cigarettes.  She has been smoking about 0.25 packs per day. She has never used smokeless tobacco. She reports that she drinks about 0.6 ounces of alcohol per week. She reports that she does not use illicit drugs. family history includes Alcohol abuse in her father; Diabetes in her father and mother; Hyperlipidemia in her mother; Hypertension in her father and mother; and Thyroid disease in her mother.  There is no history of Cancer, and Early death, and Heart disease, and Kidney disease, and Stroke, . Allergies  Allergen Reactions  . Orange Syrup Hives and Shortness Of Breath   Current Outpatient Prescriptions on File Prior to Visit  Medication Sig Dispense Refill  . etonogestrel (IMPLANON) 68 MG IMPL implant Inject 1 each into the skin once.      . etonogestrel (IMPLANON) 68 MG IMPL implant Inject 1 each (68 mg total) into the skin once.  1 each  0  . ondansetron (ZOFRAN) 4 MG tablet Take 1 tablet (4 mg total) by mouth every 6 (six) hours.  12 tablet  0  . oseltamivir (TAMIFLU) 75 MG capsule Take 1 capsule (75 mg total) by mouth every 12 (twelve) hours.  10 capsule  0   No current facility-administered medications on file prior to visit.   Review of Systems  Constitutional: Negative for unexpected weight change, or unusual diaphoresis  HENT: Negative for tinnitus.    Eyes: Negative for photophobia and visual disturbance.  Respiratory: Negative for choking and stridor.      Objective:   Physical Exam BP 92/64  Pulse 84  Temp(Src) 98.1 F (36.7 C) (Oral)  Ht 5\' 2"  (1.575 m)  Wt 239 lb (108.41 kg)  BMI 43.7 kg/m2  SpO2 97%  LMP 07/07/2012 VS noted, ,mild ill Constitutional: Pt appears well-developed and well-nourished.  HENT: Head: NCAT.  Right Ear: External ear normal.  Left Ear: External ear normal.  Eyes: Conjunctivae and EOM are normal. Pupils are equal, round, and reactive to light. Bilat tm's with mild erythema.  Max sinus areas mild tender.  Pharynx with mild erythema, no exudate but small pus area to right tonsil noted  Neck: Normal range of motion. Neck supple.  Cardiovascular: Normal rate and regular rhythm.   Pulmonary/Chest: Effort normal and breath sounds normal.  except few basilar crackles o/w indeterminate Neurological: Pt is alert. Not confused  Skin: Skin is warm. No erythema.  Psychiatric: Pt behavior is normal. Thought content normal.     Assessment & Plan:

## 2012-08-15 ENCOUNTER — Telehealth: Payer: Self-pay | Admitting: Internal Medicine

## 2012-08-15 NOTE — Telephone Encounter (Signed)
Ok for note to return to work, without restriction

## 2012-08-15 NOTE — Telephone Encounter (Signed)
Needs letter to return to work, please fax note to [facility] [phone]

## 2012-08-15 NOTE — Telephone Encounter (Signed)
I did not see her for this. Dr. Jonny Ruiz will have to write the note.

## 2012-08-18 NOTE — Telephone Encounter (Signed)
Completed letter and faxed 

## 2012-09-29 ENCOUNTER — Encounter: Payer: Self-pay | Admitting: Family Medicine

## 2012-09-29 ENCOUNTER — Ambulatory Visit (INDEPENDENT_AMBULATORY_CARE_PROVIDER_SITE_OTHER): Payer: Managed Care, Other (non HMO) | Admitting: Family Medicine

## 2012-09-29 VITALS — BP 120/80 | HR 102 | Temp 97.5°F | Wt 237.8 lb

## 2012-09-29 DIAGNOSIS — R05 Cough: Secondary | ICD-10-CM

## 2012-09-29 DIAGNOSIS — R059 Cough, unspecified: Secondary | ICD-10-CM

## 2012-09-29 DIAGNOSIS — J019 Acute sinusitis, unspecified: Secondary | ICD-10-CM

## 2012-09-29 MED ORDER — HYDROCODONE-HOMATROPINE 5-1.5 MG PO TABS: ORAL_TABLET | ORAL | Status: DC

## 2012-09-29 MED ORDER — GUAIFENESIN-CODEINE 100-10 MG/5ML PO SYRP: ORAL_SOLUTION | ORAL | Status: DC

## 2012-09-29 MED ORDER — AMOXICILLIN-POT CLAVULANATE 875-125 MG PO TABS: 1.0000 | ORAL_TABLET | Freq: Two times a day (BID) | ORAL | Status: DC

## 2012-09-29 NOTE — Patient Instructions (Signed)

## 2012-09-29 NOTE — Progress Notes (Signed)
  Subjective:     Traci Peterson is a 27 y.o. female who presents for evaluation of sinus pain. Symptoms include: congestion, facial pain, headaches, nasal congestion, purulent rhinorrhea and sinus pressure. Onset of symptoms was 4 days ago. Symptoms have been gradually worsening since that time. Past history is significant for no history of pneumonia or bronchitis. Patient is a non-smoker.  The following portions of the patient's history were reviewed and updated as appropriate: allergies, current medications, past family history, past medical history, past social history, past surgical history and problem list.  Review of Systems Pertinent items are noted in HPI.   Objective:    BP 120/80  Pulse 102  Temp(Src) 97.5 F (36.4 C) (Oral)  Wt 237 lb 12.8 oz (107.865 kg)  BMI 43.48 kg/m2  SpO2 98% General appearance: alert, cooperative, appears stated age and no distress Ears: normal TM's and external ear canals both ears Nose: green discharge, moderate congestion, turbinates red, swollen, sinus tenderness bilateral Throat: abnormal findings: mild oropharyngeal erythema Neck: mild anterior cervical adenopathy, supple, symmetrical, trachea midline and thyroid not enlarged, symmetric, no tenderness/mass/nodules Lungs: clear to auscultation bilaterally Heart: S1, S2 normal    Assessment:    Acute bacterial sinusitis.    Plan:    Nasal steroids per medication orders. Antihistamines per medication orders. Augmentin per medication orders. cough syrup per orders and f/u prn

## 2012-10-07 ENCOUNTER — Encounter: Payer: Self-pay | Admitting: Internal Medicine

## 2012-10-09 ENCOUNTER — Encounter: Payer: Self-pay | Admitting: Internal Medicine

## 2012-10-09 ENCOUNTER — Ambulatory Visit (INDEPENDENT_AMBULATORY_CARE_PROVIDER_SITE_OTHER): Payer: Managed Care, Other (non HMO) | Admitting: Internal Medicine

## 2012-10-09 VITALS — BP 102/58 | HR 87 | Temp 98.6°F | Resp 16 | Wt 235.2 lb

## 2012-10-09 DIAGNOSIS — B353 Tinea pedis: Secondary | ICD-10-CM

## 2012-10-09 DIAGNOSIS — L602 Onychogryphosis: Secondary | ICD-10-CM

## 2012-10-09 DIAGNOSIS — L608 Other nail disorders: Secondary | ICD-10-CM

## 2012-10-09 MED ORDER — KETOCONAZOLE 2 % EX CREA: TOPICAL_CREAM | Freq: Two times a day (BID) | CUTANEOUS | Status: DC

## 2012-10-09 MED ORDER — TERBINAFINE HCL 250 MG PO TABS: 250.0000 mg | ORAL_TABLET | Freq: Every day | ORAL | Status: DC

## 2012-10-09 NOTE — Assessment & Plan Note (Signed)
will treat with terbinafine tabs

## 2012-10-09 NOTE — Patient Instructions (Signed)
Ringworm, Nail  A fungal infection of the nail (tinea unguium/onychomycosis) is common. It is common as the visible part of the nail is composed of dead cells which have no blood supply to help prevent infection. It occurs because fungi are everywhere and will pick any opportunity to grow on any dead material.  Because nails are very slow growing they require up to 2 years of treatment with anti-fungal medications. The entire nail back to the base is infected. This includes approximately  of the nail which you cannot see.  If your caregiver has prescribed a medication by mouth, take it every day and as directed. No progress will be seen for at least 6 to 9 months. Do not be disappointed! Because fungi live on dead cells with little or no exposure to blood supply, medication delivery to the infection is slow; thus the cure is slow. It is also why you can observe no progress in the first 6 months. The nail becoming cured is the base of the nail, as it has the blood supply. Topical medication such as creams and ointments are usually not effective. Important in successful treatment of nail fungus is closely following the medication regimen that your doctor prescribes.  Sometimes you and your caregiver may elect to speed up this process by surgical removal of all the nails. Even this may still require 6 to 9 months of additional oral medications.  See your caregiver as directed. Remember there will be no visible improvement for at least 6 months. See your caregiver sooner if other signs of infection (redness and swelling) develop.  Document Released: 05/11/2000 Document Revised: 08/06/2011 Document Reviewed: 07/20/2008  ExitCare Patient Information 2013 [employer].

## 2012-10-09 NOTE — Assessment & Plan Note (Signed)
Will treat with terbinafine tabs and ketoconazole cream

## 2012-10-09 NOTE — Progress Notes (Signed)
Subjective:    Patient ID: Traci Peterson, female    DOB: 10/02/1985, 27 y.o.   MRN: [id number]  Rash This is a recurrent problem. The current episode started more than 1 year ago. The problem has been gradually worsening since onset. The affected locations include the left hand, left foot, right hand and right foot. The rash is characterized by itchiness, redness, scaling and blistering. She was exposed to nothing. Associated symptoms include nail changes. Pertinent negatives include no anorexia, congestion, cough, diarrhea, eye pain, facial edema, fatigue, fever, joint pain, rhinorrhea, shortness of breath, sore throat or vomiting.      Review of Systems  Constitutional: Negative.  Negative for fever and fatigue.  HENT: Negative.  Negative for congestion, sore throat and rhinorrhea.   Eyes: Negative.  Negative for pain.  Respiratory: Negative.  Negative for cough and shortness of breath.   Cardiovascular: Negative.   Gastrointestinal: Negative.  Negative for nausea, vomiting, abdominal pain, diarrhea and anorexia.  Endocrine: Negative.   Genitourinary: Negative.   Musculoskeletal: Negative.  Negative for joint pain.  Skin: Positive for nail changes and rash. Negative for color change, pallor and wound.  Neurological: Negative.   Hematological: Negative.   Psychiatric/Behavioral: Negative.        Objective:   Physical Exam  Vitals reviewed. Constitutional: She is oriented to person, place, and time. She appears well-developed and well-nourished. No distress.  HENT:  Head: Normocephalic and atraumatic.  Mouth/Throat: Oropharynx is clear and moist. No oropharyngeal exudate.  Eyes: Conjunctivae are normal. Right eye exhibits no discharge. Left eye exhibits no discharge. No scleral icterus.  Neck: Normal range of motion. Neck supple. No JVD present. No tracheal deviation present. No thyromegaly present.  Cardiovascular: Normal rate, regular rhythm, normal heart sounds and intact  distal pulses.  Exam reveals no gallop and no friction rub.   No murmur heard. Pulmonary/Chest: Effort normal and breath sounds normal. No stridor. No respiratory distress. She has no wheezes. She has no rales. She exhibits no tenderness.  Abdominal: Soft. Bowel sounds are normal. She exhibits no distension and no mass. There is no tenderness. There is no rebound and no guarding.  Musculoskeletal: Normal range of motion. She exhibits no edema and no tenderness.  Lymphadenopathy:    She has no cervical adenopathy.  Neurological: She is oriented to person, place, and time.  Skin: Skin is warm and dry. Rash noted. No purpura noted. Rash is papular. Rash is not macular, not maculopapular, not nodular, not pustular, not vesicular and not urticarial. She is not diaphoretic. No erythema. No pallor.  On her hands there are patches of papules with scale, there are scattered and no in the web spaces. On the feet there are hyperpigmented papules with scale in the web spaces and on the soles, there is 50% toenail involvement with lysis, thickening, subungual debris.  Psychiatric: She has a normal mood and affect. Her behavior is normal. Judgment and thought content normal.     Lab Results  Component Value Date   WBC 5.6 08/08/2012   HGB 13.9 08/08/2012   HCT 41.2 08/08/2012   PLT 238.0 08/08/2012   GLUCOSE 90 08/08/2012   CHOL 164 12/31/2011   TRIG 129.0 12/31/2011   HDL 40.60 12/31/2011   LDLCALC 98 12/31/2011   ALT 30 08/08/2012   AST 34 08/08/2012   NA 138 08/08/2012   K 4.1 08/08/2012   CL 107 08/08/2012   CREATININE 0.8 08/08/2012   BUN 6 08/08/2012   CO2 24  08/08/2012       Assessment & Plan:

## 2012-11-22 ENCOUNTER — Emergency Department (HOSPITAL_COMMUNITY)
Admission: EM | Admit: 2012-11-22 | Discharge: 2012-11-22 | Disposition: A | Discharge status: Home / Self Care | Payer: Worker's Compensation | Attending: Emergency Medicine | Admitting: Emergency Medicine

## 2012-11-22 ENCOUNTER — Emergency Department (HOSPITAL_COMMUNITY): Payer: Worker's Compensation

## 2012-11-22 ENCOUNTER — Encounter (HOSPITAL_COMMUNITY): Payer: Self-pay

## 2012-11-22 DIAGNOSIS — F3289 Other specified depressive episodes: Secondary | ICD-10-CM | POA: Insufficient documentation

## 2012-11-22 DIAGNOSIS — M549 Dorsalgia, unspecified: Secondary | ICD-10-CM

## 2012-11-22 DIAGNOSIS — M545 Low back pain, unspecified: Secondary | ICD-10-CM | POA: Insufficient documentation

## 2012-11-22 DIAGNOSIS — R51 Headache: Secondary | ICD-10-CM | POA: Insufficient documentation

## 2012-11-22 DIAGNOSIS — F17200 Nicotine dependence, unspecified, uncomplicated: Secondary | ICD-10-CM | POA: Insufficient documentation

## 2012-11-22 DIAGNOSIS — F329 Major depressive disorder, single episode, unspecified: Secondary | ICD-10-CM | POA: Insufficient documentation

## 2012-11-22 DIAGNOSIS — Z79899 Other long term (current) drug therapy: Secondary | ICD-10-CM | POA: Insufficient documentation

## 2012-11-22 MED ORDER — HYDROCODONE-ACETAMINOPHEN 5-325 MG PO TABS: 2.0000 | ORAL_TABLET | ORAL | Status: DC | PRN

## 2012-11-22 MED ORDER — HYDROMORPHONE HCL PF 1 MG/ML IJ SOLN
1.0000 mg | Freq: Once | INTRAMUSCULAR | Status: AC
  Administered 2012-11-22: 1 mg via INTRAMUSCULAR
  Filled 2012-11-22: qty 1

## 2012-11-22 NOTE — ED Notes (Signed)
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 She states she is having some anxiety; as she attended a funeral today.  She is tearful and in no distress.

## 2012-11-22 NOTE — ED Provider Notes (Signed)
Medical screening examination/treatment/procedure(s) were performed by non-physician practitioner and as supervising physician I was immediately available for consultation/collaboration.   Kyanna Mahrt Y. Saylor Sheckler, MD 11/22/12 1957 

## 2012-11-22 NOTE — ED Provider Notes (Signed)
History  This chart was scribed for non-physician practitioner working with Traci Peterson. Traci Lamas, MD by Greggory Stallion, ED scribe. This patient was seen in room WTR8/WTR8 and the patient's care was started at 5:39 PM.  CSN: [id number] Arrival date & time 11/22/12  1711   Chief Complaint  Patient presents with  . Back Pain   Patient is a 27 y.o. female presenting with back pain. The history is provided by the patient. No language interpreter was used.  Back Pain Location:  Lumbar spine Pain severity:  Moderate Onset quality:  Gradual Duration:  2 days Timing:  Constant Chronicity:  New Associated symptoms: headaches     HPI Comments: Traci Peterson is a 27 y.o. female who presents to the Emergency Department complaining of gradual onset, constant sharp mid to lower back pain that started two days ago while she was helping a client at work. She states the pain was shooting to her left side. Pt states it was hard for her to breathe or sit down. She states sitting still relieves the pain some. She states when she ambulates she feels pain from her head down to her legs. 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 Pt states she is having some anxiety because she attended a funeral today. Pt states she was hyperventilating earlier today and LOC. She was found on the bathroom floor.  Unsure why she syncopized.  Denies confusion afterward. She states her whole back was burning earlier. Pt states when she just got up she felt the burning in her arms bilaterally. She states she has HA and trouble breathing. Pt's mother states she is allergic to anything with orange dye in it. She states was given mediation for a back strain Thursday that was orange. Denies taking the medication this morning.  Pt denies CP, fever, and chills as associated symptoms. LMP last week  Past Medical History  Diagnosis Date  . Depression    Past Surgical History  Procedure Laterality Date  . Dental surgery      Family History  Problem Relation Age of Onset  . Hypertension Mother   . Hyperlipidemia Mother   . Diabetes Mother   . Thyroid disease Mother   . Diabetes Father   . Hypertension Father   . Alcohol abuse Father   . Cancer Neg Hx   . Early death Neg Hx   . Heart disease Neg Hx   . Kidney disease Neg Hx   . Stroke Neg Hx    History  Substance Use Topics  . Smoking status: Current Some Day Smoker -- 0.25 packs/day    Types: Cigarettes  . Smokeless tobacco: Never Used  . Alcohol Use: 0.6 oz/week    1 Glasses of wine per week   OB History   Grav Para Term Preterm Abortions TAB SAB Ect Mult Living                 Review of Systems  Musculoskeletal: Positive for back pain.  Neurological: Positive for headaches.  All other systems reviewed and are negative.    Allergies  Orange syrup  Home Medications   Current Outpatient Rx  Name  Route  Sig  Dispense  Refill  . cyclobenzaprine (AMRIX) 15 MG 24 hr capsule   Oral   Take 15 mg by mouth daily as needed for muscle spasms.         Marland Kitchen ketoconazole (NIZORAL) 2 % cream   Topical   Apply topically 2 (  two) times daily.   60 g   2   . naproxen (NAPROSYN) 500 MG tablet   Oral   Take 500 mg by mouth 2 (two) times daily with a meal.         . terbinafine (LAMISIL) 250 MG tablet   Oral   Take 1 tablet (250 mg total) by mouth daily.   30 tablet   3   . etonogestrel (IMPLANON) 68 MG IMPL implant   Subcutaneous   Inject 1 each into the skin once.          BP 145/87  Pulse 77  Temp(Src) 98.5 F (36.9 C) (Oral)  Resp 18  SpO2 100%  LMP 11/20/2012  Physical Exam  Nursing note and vitals reviewed. Constitutional: She is oriented to person, place, and time. She appears well-developed and well-nourished. No distress.  Morbidly obese. Non toxic in appearance.   HENT:  Head: Normocephalic and atraumatic.  Eyes: EOM are normal.  Neck: Neck supple. No tracheal deviation present.  Cardiovascular: Normal rate.    Pulmonary/Chest: Effort normal. No respiratory distress.  Musculoskeletal: Normal range of motion.  Diffuse tenderness throughout full body upon touch. No focal point tenderness. Normal gait.  5/5 strength to all 4 extremities  Neurological: She is alert and oriented to person, place, and time.  Skin: Skin is warm and dry. No rash noted.  Psychiatric: She has a normal mood and affect. Her behavior is normal.    ED Course  Procedures (including critical care time)  DIAGNOSTIC STUDIES: Oxygen Saturation is 100% on RA, normal by my interpretation.    COORDINATION OF CARE: 6:26 PM-Discussed treatment plan which includes xray and pain medication with pt at bedside and pt agreed to plan.   6:31 PM Pt is tearful, just return from funeral home.  C/o pain all over body and pain to low back since Thursday.  Pt has tenderness throughout body even with slight touch.  Able to ambulate without difficulty.  Normal skin appearance, afebrile, VSS, no resp distress.  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  Xray ordered due to difficulty examining pt due to large body habitus.  Xray is necessary considering pt was seen for the same complaint on Thursday, was given muscle relaxant and naprosyn, which has not helped.     7:22 PM Pt felt much better after receiving pain medication.  Able to ambulate without difficulty.  No focal neuro deficits.  Orthostatic VS normal. Sts she is back to her baseline.  Xray of back unremarkable.  Is A&O x3.  No cardiac hx.  i do not suspect aortic dissection, stroke, infection, or seizure.  I believe pt will benefit from further evaluation by her PCP.    Labs Reviewed - No data to display Dg Lumbar Spine Complete  11/22/2012   *RADIOLOGY REPORT*  Clinical Data: Low back pain extending to the left.  LUMBAR SPINE - COMPLETE 4+ VIEW  Comparison: None.  Findings: Five lumbar-type vertebral bodies show normal alignment. Disc  heights are normal.  No osseous or articular pathology. Sacroiliac joints appear normal.  No other focal lesion.  IMPRESSION: Normal radiographs   Original Report Authenticated By: Paulina Fusi, M.D.   1. Back pain     MDM  BP 114/75  Pulse 80  Temp(Src) 98.5 F (36.9 C) (Oral)  Resp 18  SpO2 100%  LMP 11/20/2012  I have reviewed nursing notes and vital signs. I personally  reviewed the imaging tests through PACS system  I reviewed available ER/hospitalization records thought the EMR    I personally performed the services described in this documentation, which was scribed in my presence. The recorded information has been reviewed and is accurate.    Fayrene Helper, PA-C 11/22/12 1956

## 2012-12-21 ENCOUNTER — Encounter: Payer: Self-pay | Admitting: Internal Medicine

## 2013-01-06 ENCOUNTER — Telehealth: Payer: Self-pay | Admitting: Gynecology

## 2013-01-06 NOTE — Telephone Encounter (Signed)
01/05/13-Unable to reach pt as telephone number has been changed. Her [employer] ins covers the removal of existing Implanon and insertion of new  Nexplanon at 100%, no copay. Will advise pt at NG appt on 01/07/13.WL

## 2013-01-07 ENCOUNTER — Other Ambulatory Visit (HOSPITAL_COMMUNITY)
Admission: RE | Admit: 2013-01-07 | Discharge: 2013-01-07 | Disposition: A | Discharge status: Home / Self Care | Payer: Managed Care, Other (non HMO) | Source: Ambulatory Visit | Attending: Gynecology | Admitting: Gynecology

## 2013-01-07 ENCOUNTER — Encounter: Payer: Self-pay | Admitting: Gynecology

## 2013-01-07 ENCOUNTER — Ambulatory Visit (INDEPENDENT_AMBULATORY_CARE_PROVIDER_SITE_OTHER): Payer: Managed Care, Other (non HMO) | Admitting: Gynecology

## 2013-01-07 VITALS — BP 118/70 | Ht 62.5 in | Wt 234.0 lb

## 2013-01-07 DIAGNOSIS — Z113 Encounter for screening for infections with a predominantly sexual mode of transmission: Secondary | ICD-10-CM

## 2013-01-07 DIAGNOSIS — Z01419 Encounter for gynecological examination (general) (routine) without abnormal findings: Secondary | ICD-10-CM | POA: Insufficient documentation

## 2013-01-07 DIAGNOSIS — Z309 Encounter for contraceptive management, unspecified: Secondary | ICD-10-CM

## 2013-01-07 DIAGNOSIS — R87810 Cervical high risk human papillomavirus (HPV) DNA test positive: Secondary | ICD-10-CM | POA: Insufficient documentation

## 2013-01-07 DIAGNOSIS — N643 Galactorrhea not associated with childbirth: Secondary | ICD-10-CM

## 2013-01-07 DIAGNOSIS — Z1151 Encounter for screening for human papillomavirus (HPV): Secondary | ICD-10-CM | POA: Insufficient documentation

## 2013-01-07 LAB — WET PREP FOR TRICH, YEAST, CLUE
Trich, Wet Prep: NONE SEEN
Yeast Wet Prep HPF POC: NONE SEEN

## 2013-01-07 MED ORDER — METRONIDAZOLE 500 MG PO TABS: 500.0000 mg | ORAL_TABLET | Freq: Two times a day (BID) | ORAL | Status: DC

## 2013-01-07 NOTE — Progress Notes (Signed)
Traci Peterson 1985/07/21 [id number]   History:    27 y.o.  for annual gyn exam new patient to the practice who has informed me that 3 years ago she had a Nexplanon  Put in her medial upper left arm by another provider and is due to come out. She wanted to discuss alternative forms of contraception and is interested in the IUD. Her cycles are irregular. She does state that she has completed her Gardasil series in the past. She denies any past history of abnormal Pap smears. Her Tdap vaccine is up to date. She states that time in the past she did have some slight clear fluid come out from her nipples but not recently. She denies any unusual headaches or double vision. Patient denies any prior history of STDs.  Past medical history,surgical history, family history and social history were all reviewed and documented in the EPIC chart.  Gynecologic History Patient's last menstrual period was 12/22/2012. Contraception: Nexplanon Last Pap: greater than 3 years ago. Results were: normal Last mammogram: none indicated. Results were: none indicated  Obstetric History OB History  Gravida Para Term Preterm AB SAB TAB Ectopic Multiple Living  0                  ROS: A ROS was performed and pertinent positives and negatives are included in the history.  GENERAL: No fevers or chills. HEENT: No change in vision, no earache, sore throat or sinus congestion. NECK: No pain or stiffness. CARDIOVASCULAR: No chest pain or pressure. No palpitations. PULMONARY: No shortness of breath, cough or wheeze. GASTROINTESTINAL: No abdominal pain, nausea, vomiting or diarrhea, melena or bright red blood per rectum. GENITOURINARY: No urinary frequency, urgency, hesitancy or dysuria. MUSCULOSKELETAL: No joint or muscle pain, no back pain, no recent trauma. DERMATOLOGIC: No rash, no itching, no lesions. ENDOCRINE: No polyuria, polydipsia, no heat or cold intolerance. No recent change in weight. HEMATOLOGICAL: No anemia or easy  bruising or bleeding. NEUROLOGIC: No headache, seizures, numbness, tingling or weakness. PSYCHIATRIC: No depression, no loss of interest in normal activity or change in sleep pattern.     Exam: chaperone present  BP 118/70  Ht 5' 2.5" (1.588 m)  Wt 234 lb (106.142 kg)  BMI 42.09 kg/m2  LMP 12/22/2012  Body mass index is 42.09 kg/(m^2).  General appearance : Well developed well nourished female. No acute distress HEENT: Neck supple, trachea midline, no carotid bruits, no thyroidmegaly Lungs: Clear to auscultation, no rhonchi or wheezes, or rib retractions  Heart: Regular rate and rhythm, no murmurs or gallops Breast:Examined in sitting and supine position were symmetrical in appearance, no palpable masses or tenderness,  no skin retraction, no nipple inversion, no nipple discharge, no skin discoloration, no axillary or supraclavicular lymphadenopathy Abdomen: no palpable masses or tenderness, no rebound or guarding Extremities: no edema or skin discoloration or tenderness,left medial arm subdermal implant palpated  Pelvic:  Bartholin, Urethra, Skene Glands: Within normal limits             Vagina: No gross lesions or discharge, fish odor discharge and noted  Cervix: No gross lesions or discharge  Uterus anteverted, normal size, shape and consistency, non-tender and mobile  Adnexa  Without masses or tenderness  Anus and perineum  normal   Rectovaginal  normal sphincter tone without palpated masses or tenderness             Hemoccult none indicated   Wet prep: Too numerous to count bacteria, few clue cells,pos amine  GC and chlamydia culture pending at time of this dictation  Assessment/Plan:  27 y.o. female for annual exam who stated that several years ago she had galactorrhea. She is asymptomatic today. We'll check her prolactin level today. GC and chlamydia culture pending at time of this dictation. Her primary physician did her lab work in March of this year. Pap smear was done  today. The new guidelines discussed. Patient was counseled on the IUD and literature and information was provided. Her bacterial vaginosis she'll be placed on Flagyl 500 mg one by mouth twice a day for 5 days. Patient will return back in a few weeks to remove the subdermal implant in place the Mirena IUD or the Burns IUD.    Ok Edwards MD, 4:10 PM 01/07/2013

## 2013-01-07 NOTE — Patient Instructions (Addendum)
Intrauterine Device Information An intrauterine device (IUD) is inserted into your uterus and prevents pregnancy. There are 2 types of IUDs available:  Copper IUD. This type of IUD is wrapped in copper wire and is placed inside the uterus. Copper makes the uterus and fallopian tubes produce a fluid that kills sperm. The copper IUD can stay in place for 10 years.  Hormone IUD. This type of IUD contains the hormone progestin (synthetic progesterone). The hormone thickens the cervical mucus and prevents sperm from entering the uterus, and it also thins the uterine lining to prevent implantation of a fertilized egg. The hormone can weaken or kill the sperm that get into the uterus. The hormone IUD can stay in place for 5 years. Your caregiver will make sure you are a good candidate for a contraceptive IUD. Discuss with your caregiver the possible side effects. ADVANTAGES  It is highly effective, reversible, long-acting, and low maintenance.  There are no estrogen-related side effects.  An IUD can be used when breastfeeding.  It is not associated with weight gain.  It works immediately after insertion.  The copper IUD does not interfere with your female hormones.  The progesterone IUD can make heavy menstrual periods lighter.  The progesterone IUD can be used for 5 years.  The copper IUD can be used for 10 years. DISADVANTAGES  The progesterone IUD can be associated with irregular bleeding patterns.  The copper IUD can make your menstrual flow heavier and more painful.  You may experience cramping and vaginal bleeding after insertion. Document Released: 04/17/2004 Document Revised: 08/06/2011 Document Reviewed: 09/16/2010 [facility] Patient Information 2014 [place], [place]. Bacterial Vaginosis Bacterial vaginosis (BV) is a vaginal infection where the normal balance of bacteria in the vagina is disrupted. The normal balance is then replaced by an overgrowth of certain bacteria. There are  several different kinds of bacteria that can cause BV. BV is the most common vaginal infection in women of childbearing age. CAUSES   The cause of BV is not fully understood. BV develops when there is an increase or imbalance of harmful bacteria.  Some activities or behaviors can upset the normal balance of bacteria in the vagina and put women at increased risk including:  Having a new sex partner or multiple sex partners.  Douching.  Using an intrauterine device (IUD) for contraception.  It is not clear what role sexual activity plays in the development of BV. However, women that have never had sexual intercourse are rarely infected with BV. Women do not get BV from toilet seats, bedding, swimming pools or from touching objects around them.  SYMPTOMS   Grey vaginal discharge.  A fish-like odor with discharge, especially after sexual intercourse.  Itching or burning of the vagina and vulva.  Burning or pain with urination.  Some women have no signs or symptoms at all. DIAGNOSIS  Your caregiver must examine the vagina for signs of BV. Your caregiver will perform lab tests and look at the sample of vaginal fluid through a microscope. They will look for bacteria and abnormal cells (clue cells), a pH test higher than 4.5, and a positive amine test all associated with BV.  RISKS AND COMPLICATIONS   Pelvic inflammatory disease (PID).  Infections following gynecology surgery.  Developing HIV.  Developing herpes virus. TREATMENT  Sometimes BV will clear up without treatment. However, all women with symptoms of BV should be treated to avoid complications, especially if gynecology surgery is planned. Female partners generally do not need to be treated.  However, BV may spread between female sex partners so treatment is helpful in preventing a recurrence of BV.   BV may be treated with antibiotics. The antibiotics come in either pill or vaginal cream forms. Either can be used with  nonpregnant or pregnant women, but the recommended dosages differ. These antibiotics are not harmful to the baby.  BV can recur after treatment. If this happens, a second round of antibiotics will often be prescribed.  Treatment is important for pregnant women. If not treated, BV can cause a premature delivery, especially for a pregnant woman who had a premature birth in the past. All pregnant women who have symptoms of BV should be checked and treated.  For chronic reoccurrence of BV, treatment with a type of prescribed gel vaginally twice a week is helpful. HOME CARE INSTRUCTIONS   Finish all medication as directed by your caregiver.  Do not have sex until treatment is completed.  Tell your sexual partner that you have a vaginal infection. They should see their caregiver and be treated if they have problems, such as a mild rash or itching.  Practice safe sex. Use condoms. Only have 1 sex partner. PREVENTION  Basic prevention steps can help reduce the risk of upsetting the natural balance of bacteria in the vagina and developing BV:  Do not have sexual intercourse (be abstinent).  Do not douche.  Use all of the medicine prescribed for treatment of BV, even if the signs and symptoms go away.  Tell your sex partner if you have BV. That way, they can be treated, if needed, to prevent reoccurrence. SEEK MEDICAL CARE IF:   Your symptoms are not improving after 3 days of treatment.  You have increased discharge, pain, or fever. MAKE SURE YOU:   Understand these instructions.  Will watch your condition.  Will get help right away if you are not doing well or get worse. FOR MORE INFORMATION  Division of STD Prevention (DSTDP), Centers for Disease Control and Prevention: [employer] American Social Health Association (ASHA): www.ashastd.org  Document Released: 05/14/2005 Document Revised: 08/06/2011 Document Reviewed: 11/04/2008 [facility] Patient Information 2014 [place],  [place].

## 2013-01-08 ENCOUNTER — Telehealth: Payer: Self-pay | Admitting: Gynecology

## 2013-01-08 ENCOUNTER — Other Ambulatory Visit: Payer: Self-pay | Admitting: Gynecology

## 2013-01-08 DIAGNOSIS — Z3049 Encounter for surveillance of other contraceptives: Secondary | ICD-10-CM

## 2013-01-08 LAB — PROLACTIN: Prolactin: 12.6 ng/mL

## 2013-01-08 LAB — STD PANEL: Hepatitis B Surface Ag: NEGATIVE

## 2013-01-08 MED ORDER — LEVONORGESTREL 20 MCG/24HR IU IUD: INTRAUTERINE_SYSTEM | Freq: Once | INTRAUTERINE | Status: DC

## 2013-01-08 NOTE — Telephone Encounter (Signed)
01/08/13-LM VM that the benefits for Mirena & insertion are the same as the Nexplanon, no copay, 100% covered. She will call start of cycle/wl

## 2013-01-12 ENCOUNTER — Other Ambulatory Visit: Payer: Self-pay | Admitting: Gynecology

## 2013-01-15 ENCOUNTER — Ambulatory Visit (INDEPENDENT_AMBULATORY_CARE_PROVIDER_SITE_OTHER): Payer: Managed Care, Other (non HMO) | Admitting: Gynecology

## 2013-01-15 ENCOUNTER — Encounter: Payer: Self-pay | Admitting: Gynecology

## 2013-01-15 VITALS — BP 120/78

## 2013-01-15 DIAGNOSIS — Z3046 Encounter for surveillance of implantable subdermal contraceptive: Secondary | ICD-10-CM

## 2013-01-15 DIAGNOSIS — Z30017 Encounter for initial prescription of implantable subdermal contraceptive: Secondary | ICD-10-CM

## 2013-01-15 NOTE — Progress Notes (Addendum)
27 year old who was seen in the office recently for annual exam was due to have her Nexplanon removed since it had been 3 years when it was placed at the [facility]. Her recent Pap smear in the office demonstrated ascus with high-risk HPV for which she will return back to the office in a couple of weeks for colposcopy. Patient understands the risks benefits and pros and cons of this form of contraception. She doesn't is good for 3 years and is 99% effective.                               Nexplanon procedure note (removal)  The patient presented to the office today requesting for removal of her Nexplanon that was placed in the year 2011 on her left arm.   On examination the nexplanon implant was palpated and the distal end  (end  closest to the elbow) was marked. The area was sterilized with Betadine solution. 1% lidocaine was used for local anesthesia and approximately 1 cc  was injected into the site that was marked where  the incision was to be made. The local anesthetic was injected under the implant in an effort to keep it  close to the skin surface. Slight pressure pushing downward was made at the proximal end  of the implant in an effort to stabilize it. A bulge appeared indicating the distal end of the implant. Starting at the distal tip of the implant, a small longitudinal incision of 2 mm was made towards the elbow. By gently pushing the implant toward the incision the tip became visible. Grasping the implant with a curved forcep facilitated in gently removing the implant. Full confirmation of the entire implant which is 4 cm long was inspected and was intact and was shown to the patient and discarded. After removing the implant, the incision was closed with 2 interrupted sutures of 4-0 Vicryl .                                                                     Nexplanon Procedure Note (insertion)     The patient was laying on her back with her nondominant arm flexed at the elbow and  externally rotated. The insertion site was identified as the underside of the nondominant upper arm approximately 8 cm from the medial epicondyle of the humerus. 2 marks were made with a sterile marker: The first marked the spot where the Nexplanon  implant was to be inserted, and a second, marked a spot a few centimeters proximal to the first marke to guide the direction of the insertion. The area was cleansed with Betadine solution. The area was anesthetized with 1% lidocaine  (1 cc)  at the area the injection site and underneath the skin along the planned insertion tunnel. The preloaded disposable Nexplanon was removed from its sterile casing.  The applicator was held above the needle at the textured surface area. The transparent protector was removed. With a freehand, the skin was stretched around the insertion site with a thumb and index finger. The skin was then punctured with the tip of the needle angled at 30. The Nexplanon applicator was lowered to a horizontal position. While lifting  the skin with the tip of the needle the needle was then slid to its full length. The applicator was kept in sitting position with a needle inserted to its full length. The purple slider was unlocked by pushing it slightly downward. The slider was fully moved back until it stopped. This allowed the implant to be in the final subdermal position and the needle was locked inside the body of the applicator. The applicator was then removed. A Steri-Strip was made over the incision and a Kerlix wrap was placed which patient is to remove tomorrow. No complications patient tolerated procedure well and was released home with instructions.  Lot #[id number]  Patient will return back in 2 weeks for colposcopy for followup of her abnormal Pap smear.   [facility] HMD4:43 PMTD@

## 2013-01-15 NOTE — Patient Instructions (Addendum)
HPV Test The HPV (human papillomavirus) test is used to screen for high-risk types with HPV infection. HPV is a group of about 100 related viruses, of which 40 types are genital viruses. Most HPV viruses cause infections that usually resolve without treatment within 2 years. Some HPV infections can cause skin and genital warts (condylomata). HPV types 16, 18, 31 and 45 are considered high-risk types of HPV. High-risk types of HPV do not usually cause visible warts, but if untreated, may lead to cancers of the outlet of the womb (cervix) or anus. An HPV test identifies the DNA (genetic) strands of the HPV infection. Because the test identifies the DNA strands, the test is also referred to as the HPV DNA test. Although HPV is found in both males and females, the HPV test is only used to screen for cervical cancer in females. This test is recommended for females:  With an abnormal Pap test.  After treatment of an abnormal Pap test.  Aged 23 and older.  After treatment of a high-risk HPV infection. The HPV test may be done at the same time as a Pap test in females over the age of 77. Both the HPV and Pap test require a sample of cells from the cervix. PREPARATION FOR TEST  You may be asked to avoid douching, tampons, or vaginal medicines for 48 hours before the HPV test. You will be asked to urinate before the test. For the HPV test, you will need to lie on an exam table with your feet in stirrups. A spatula will be inserted into the vagina. The spatula will be used to swab the cervix for a cell and mucus sample. The sample will be further evaluated in a lab under a microscope. NORMAL FINDINGS  Normal: High-risk HPV is not found.  Ranges for normal findings may vary among different laboratories and hospitals. You should always check with your doctor after having lab work or other tests done to discuss the meaning of your test results and whether your values are considered within normal limits. MEANING  OF TEST An abnormal HPV test means that high-risk HPV is found. Your caregiver may recommend further testing. Your caregiver will go over the test results with you. He or she will and discuss the importance and meaning of your results, as well as treatment options and the need for additional tests, if necessary. OBTAINING THE RESULTS  It is your responsibility to obtain your test results. Ask the lab or department performing the test when and how you will get your results. Document Released: 06/08/2004 Document Revised: 08/06/2011 Document Reviewed: 02/21/2005 [facility] Patient Information 2014 [place], [place].  Colposcopy Colposcopy is a procedure that uses a special lighted microscope (colposcope). It examines your cervix and vagina, or the area around the outside of the vagina, for signs of disease or abnormalities in the cells. You may be sent to a specialist (gynecologist) to do the colposcopy. A biopsy (tissue sample) may be collected during a colposcopy, if the caregiver finds any unusual cells. The biopsy is sent to the lab for further testing, and the results are reported back to your caregiver. A WOMAN MAY NEED THIS PROCEDURE IF:  She has had an abnormal pap smear (taking cells from the cervix for testing).  She has a sore on her cervix, and a Pap test was normal.  The Pap test suggests human papilloma virus (HPV). This virus can cause genital warts and is linked to the development of cervical cancer.  She has  genital warts on the cervix, or in or around the outside of the vagina.  Her mother took the drug DES while pregnant.  She has painful intercourse.  She has vaginal bleeding, especially after sexual intercourse.  There is a need to evaluate the results of previous treatment. BEFORE THE PROCEDURE   Colposcopy is done when you are not having a menstrual period.  For 24 hours before the colposcopy, do not:  Douche.  Use tampons.  Use medicines, creams, or  suppositories in the vagina.  Have sexual intercourse. PROCEDURE   A colposcopy is done while a woman is lying on her back with her feet in foot rests (stirrups).  A speculum is placed inside the vagina to keep it open and to allow the caregiver to see the cervix. This is the same instrument used to do a pap smear.  The colposcope is placed outside the vagina. It is used to magnify and examine the cervix, vagina, and the area around the outside of the vagina.  A small amount of liquid solution is placed on the area that is to be viewed. This solution is placed on with a cotton applicator. This solution makes it easier to see the abnormal cells.  Your caregiver will suck out mucus and cells from the canal of the cervix.  Small pieces of tissue for biopsy may be taken at the same time. You may feel mild pain or discomfort when this is done.  Your caregiver will record the location of the abnormal areas and send the tissue samples to a lab for analysis.  If your caregiver biopsies the vagina or outside of the vagina, a local anesthetic (novocaine) is usually given. AFTER THE PROCEDURE   You may have some cramping that often goes away in a few minutes. You may have some soreness for a couple of days.  You may take over-the-counter pain medicine as advised by your caregiver. Do not take aspirin because it can cause bleeding.  Lie down for a few minutes if you feel lightheaded.  You may have some bleeding or dark discharge that should stop in a few days.  You may need to wear a sanitary pad for a few days. HOME CARE INSTRUCTIONS   Avoid sex, douching, and using tampons for a week or as directed.  Only take medicine as directed by your caregiver.  Continue to take birth control pills, if you are on them.  Not all test results are available during your visit. If your test results are not back during the visit, make an appointment with your caregiver to find out the results. Do not  assume everything is normal if you have not heard from your caregiver or the medical facility. It is important for you to follow up on all of your test results.  Follow your caregiver's advice regarding medicines, activity, follow-up visits, and follow-up Pap tests. SEEK MEDICAL CARE IF:   You develop a rash.  You have problems with your medicine. SEEK IMMEDIATE MEDICAL CARE IF:  You are bleeding heavily or are passing blood clots.  You develop a fever over 102 F (38.9 C), with or without chills.  You have abnormal vaginal discharge.  You are having cramps that do not go away after taking your pain medicine.  You feel lightheaded, dizzy, or faint.  You develop stomach pain. Document Released: 08/04/2002 Document Revised: 08/06/2011 Document Reviewed: 03/17/2009 [facility] Patient Information 2014 [place], [place].

## 2013-01-18 ENCOUNTER — Encounter: Payer: Self-pay | Admitting: Gynecology

## 2013-01-19 ENCOUNTER — Encounter: Payer: Self-pay | Admitting: Gynecology

## 2013-02-04 ENCOUNTER — Ambulatory Visit (INDEPENDENT_AMBULATORY_CARE_PROVIDER_SITE_OTHER): Payer: Managed Care, Other (non HMO) | Admitting: Gynecology

## 2013-02-04 ENCOUNTER — Encounter: Payer: Self-pay | Admitting: Gynecology

## 2013-02-04 ENCOUNTER — Ambulatory Visit: Payer: Managed Care, Other (non HMO) | Admitting: Gynecology

## 2013-02-04 VITALS — BP 120/78

## 2013-02-04 DIAGNOSIS — Z4802 Encounter for removal of sutures: Secondary | ICD-10-CM

## 2013-02-04 NOTE — Progress Notes (Signed)
The patient presented to the office today to remove suture from her medial upper left arm. The patient is status post removal of Nexplanon which was outdated and replaced with a new one.  Suture was removed medial upper left arm subdermal implant site intact. Patient currently menstruating   The patient was here for her annual exam on 01/07/2013 her Pap smear had demonstrated the following:  ATYPICAL SQUAMOUS CELLS OF UNDETERMINED SIGNIFICANCE (ASC-US).  HPV 16,18/45 Genotyping "POSITIVE" HPV 18/45 Completed by [id number] on 2013-01-15 HPV High Risk **DETECTED**  Patient scheduled to return back in 2 weeks when her menses is over so that we can proceed with colposcopy and possible biopsy of the cervix. Patient stated that in the past in another medical facility she did complete the Gardasil vaccine series

## 2013-02-04 NOTE — Patient Instructions (Signed)
Abnormal Pap Test Information During a Pap test, the cells on the surface of your cervix are checked to see if they look normal, abnormal, or if they show signs of having been altered by a certain type of virus called human papillomavirus, or HPV. Cervical cells that have been affected by HPV are called dysplasia. Dysplasia is not cancer, but describes abnormal cells found on the surface of the cervix. Depending on the degree of dysplasia, some of the cells may be considered pre-cancerous and may turn into cancer over time if follow up with a caregiver is delayed.  WHAT DOES AN ABNORMAL PAP TEST MEAN? Having an abnormal pap test does not mean that you have cancer. However, certain types of abnormal pap tests can be a sign that a person is at a higher risk of developing cancer. Your caregiver will want to do other tests to find out more about the abnormal cells. Your abnormal Pap test results could show:   Small and uncertain changes that should be carefully watched.   Cervical dysplasia that has caused mild changes and can be followed over time.  Cervical dysplasia that is more severe and needs to be followed and treated to ensure the problem goes away.  Cancer.  When severe cervical dysplasia is found and treated early, it rarely will grow into cancer.  WHAT WILL BE DONE ABOUT MY ABNORMAL PAP TEST?  A colposcopy may be needed. This is a procedure where your cervix is examined using light and magnification.  A small tissue sample of your cervix (biopsy) may need to be removed and then examined. This is often performed if there are areas that appear infected.  A sample of cells from the cervical canal may be removed with either a small brush or scraping instrument (curette). Based on the results of the procedures above, some caregivers may recommend either cryotherapy of the cervix or a surgical LEEP where a portion of the cervix is removed. LEEP is short for "loop electrical excisional  procedure." Rarely, a caregiver may recommend a cone biopsy.This is a procedure where a small, cone-shaped sample of your cervix is taken out. The part that is taken out is the area where the abnormal cells are.  WHAT IF I HAVE A DYSPLASIA OR A CANCER? You may be referred to a specialist. Radiation may also be a treatment for more advanced cancer. Having a hysterectomy is the last treatment option for dysplasia, but it is a more common treatment for someone with cancer. All treatment options will be discussed with you by your caregiver. WHAT SHOULD YOU DO AFTER BEING TREATED? If you have had an abnormal pap test, you should continue to have regular pap tests and check-ups as directed by your caregiver. Your cervical problem will be carefully watched so it does not get worse. Also, your caregiver can watch for, and treat, any new problems that may come up. Document Released: 08/29/2010 Document Revised: 08/06/2011 Document Reviewed: 05/10/2011 [facility] Patient Information 2014 [place], [place]. Colposcopy Colposcopy is a procedure that uses a special lighted microscope (colposcope). It examines your cervix and vagina, or the area around the outside of the vagina, for signs of disease or abnormalities in the cells. You may be sent to a specialist (gynecologist) to do the colposcopy. A biopsy (tissue sample) may be collected during a colposcopy, if the caregiver finds any unusual cells. The biopsy is sent to the lab for further testing, and the results are reported back to your caregiver. A WOMAN MAY  NEED THIS PROCEDURE IF:  She has had an abnormal pap smear (taking cells from the cervix for testing).  She has a sore on her cervix, and a Pap test was normal.  The Pap test suggests human papilloma virus (HPV). This virus can cause genital warts and is linked to the development of cervical cancer.  She has genital warts on the cervix, or in or around the outside of the vagina.  Her mother took the  drug DES while pregnant.  She has painful intercourse.  She has vaginal bleeding, especially after sexual intercourse.  There is a need to evaluate the results of previous treatment. BEFORE THE PROCEDURE   Colposcopy is done when you are not having a menstrual period.  For 24 hours before the colposcopy, do not:  Douche.  Use tampons.  Use medicines, creams, or suppositories in the vagina.  Have sexual intercourse. PROCEDURE   A colposcopy is done while a woman is lying on her back with her feet in foot rests (stirrups).  A speculum is placed inside the vagina to keep it open and to allow the caregiver to see the cervix. This is the same instrument used to do a pap smear.  The colposcope is placed outside the vagina. It is used to magnify and examine the cervix, vagina, and the area around the outside of the vagina.  A small amount of liquid solution is placed on the area that is to be viewed. This solution is placed on with a cotton applicator. This solution makes it easier to see the abnormal cells.  Your caregiver will suck out mucus and cells from the canal of the cervix.  Small pieces of tissue for biopsy may be taken at the same time. You may feel mild pain or discomfort when this is done.  Your caregiver will record the location of the abnormal areas and send the tissue samples to a lab for analysis.  If your caregiver biopsies the vagina or outside of the vagina, a local anesthetic (novocaine) is usually given. AFTER THE PROCEDURE   You may have some cramping that often goes away in a few minutes. You may have some soreness for a couple of days.  You may take over-the-counter pain medicine as advised by your caregiver. Do not take aspirin because it can cause bleeding.  Lie down for a few minutes if you feel lightheaded.  You may have some bleeding or dark discharge that should stop in a few days.  You may need to wear a sanitary pad for a few days. HOME CARE  INSTRUCTIONS   Avoid sex, douching, and using tampons for a week or as directed.  Only take medicine as directed by your caregiver.  Continue to take birth control pills, if you are on them.  Not all test results are available during your visit. If your test results are not back during the visit, make an appointment with your caregiver to find out the results. Do not assume everything is normal if you have not heard from your caregiver or the medical facility. It is important for you to follow up on all of your test results.  Follow your caregiver's advice regarding medicines, activity, follow-up visits, and follow-up Pap tests. SEEK MEDICAL CARE IF:   You develop a rash.  You have problems with your medicine. SEEK IMMEDIATE MEDICAL CARE IF:  You are bleeding heavily or are passing blood clots.  You develop a fever over 102 F (38.9 C), with or without  chills.  You have abnormal vaginal discharge.  You are having cramps that do not go away after taking your pain medicine.  You feel lightheaded, dizzy, or faint.  You develop stomach pain. Document Released: 08/04/2002 Document Revised: 08/06/2011 Document Reviewed: 03/17/2009 [facility] Patient Information 2014 [place], [place].

## 2013-02-16 ENCOUNTER — Ambulatory Visit: Payer: Managed Care, Other (non HMO) | Admitting: Gynecology

## 2013-02-25 ENCOUNTER — Encounter: Payer: Self-pay | Admitting: Internal Medicine

## 2013-02-25 DIAGNOSIS — L0591 Pilonidal cyst without abscess: Secondary | ICD-10-CM

## 2013-02-25 HISTORY — DX: Pilonidal cyst without abscess: L05.91

## 2013-03-02 ENCOUNTER — Encounter: Payer: Self-pay | Admitting: Internal Medicine

## 2013-03-02 ENCOUNTER — Ambulatory Visit (INDEPENDENT_AMBULATORY_CARE_PROVIDER_SITE_OTHER): Payer: Managed Care, Other (non HMO) | Admitting: Internal Medicine

## 2013-03-02 VITALS — BP 110/78 | HR 95 | Temp 97.0°F | Resp 16 | Ht 62.0 in | Wt 233.0 lb

## 2013-03-02 DIAGNOSIS — L0591 Pilonidal cyst without abscess: Secondary | ICD-10-CM | POA: Insufficient documentation

## 2013-03-02 DIAGNOSIS — Z23 Encounter for immunization: Secondary | ICD-10-CM

## 2013-03-02 MED ORDER — AMOXICILLIN 875 MG PO TABS: 875.0000 mg | ORAL_TABLET | Freq: Two times a day (BID) | ORAL | Status: DC

## 2013-03-02 NOTE — Patient Instructions (Signed)
Pilonidal Cyst A pilonidal cyst occurs when hairs get trapped (ingrown) beneath the skin in the crease between the buttocks over your sacrum (the bone under that crease). Pilonidal cysts are most common in young men with a lot of body hair. When the cyst is ruptured (breaks) or leaking, fluid from the cyst may cause burning and itching. If the cyst becomes infected, it causes a painful swelling filled with pus (abscess). The pus and trapped hairs need to be removed (often by lancing) so that the infection can heal. However, recurrence is common and an operation may be needed to remove the cyst. HOME CARE INSTRUCTIONS   If the cyst was NOT INFECTED:  Keep the area clean and dry. Bathe or shower daily. Wash the area well with a germ-killing soap. Warm tub baths may help prevent infection and help with drainage. Dry the area well with a towel.  Avoid tight clothing to keep area as moisture free as possible.  Keep area between buttocks as free of hair as possible. A depilatory may be used.  If the cyst WAS INFECTED and needed to be drained:  Your caregiver packed the wound with gauze to keep the wound open. This allows the wound to heal from the inside outwards and continue draining.  Return for a wound check in 1 day or as suggested.  If you take tub baths or showers, repack the wound with gauze following them. Sponge baths (at the sink) are a good alternative.  If an antibiotic was ordered to fight the infection, take as directed.  Only take over-the-counter or prescription medicines for pain, discomfort, or fever as directed by your caregiver.  After the drain is removed, use sitz baths for 20 minutes 4 times per day. Clean the wound gently with mild unscented soap, pat dry, and then apply a dry dressing. SEEK MEDICAL CARE IF:   You have increased pain, swelling, redness, drainage, or bleeding from the area.  You have a fever.  You have muscles aches, dizziness, or a general ill  feeling. Document Released: 05/11/2000 Document Revised: 08/06/2011 Document Reviewed: 07/09/2008 [employer] Patient Information 2014 [employer].  

## 2013-03-02 NOTE — Progress Notes (Signed)
  Subjective:    Patient ID: Traci Peterson, female    DOB: 1985-10-15, 27 y.o.   MRN: [id number]  HPI  She returns with the complaint that a pilonidal cyst may be returning. She tells me that one year ago she was seen in an [facility] and that pus was drained out of the cyst. She was doing well until a few weeks ago when she felt like the area was becoming sore again so she comes in today to have the area checked.  Review of Systems  Constitutional: Negative.  Negative for fever, chills, diaphoresis, appetite change and fatigue.  HENT: Negative.   Eyes: Negative.   Respiratory: Negative.   Cardiovascular: Negative.  Negative for leg swelling.  Gastrointestinal: Negative.  Negative for abdominal pain.  Endocrine: Negative.   Genitourinary: Negative.   Musculoskeletal: Negative.   Skin: Negative.   Allergic/Immunologic: Negative.   Neurological: Negative.   Hematological: Negative.  Negative for adenopathy. Does not bruise/bleed easily.  Psychiatric/Behavioral: Negative.        Objective:   Physical Exam  Vitals reviewed. Constitutional: She is oriented to person, place, and time. She appears well-developed and well-nourished. No distress.  HENT:  Head: Normocephalic and atraumatic.  Mouth/Throat: Oropharynx is clear and moist. No oropharyngeal exudate.  Eyes: Conjunctivae are normal. Right eye exhibits no discharge. Left eye exhibits no discharge. No scleral icterus.  Neck: Normal range of motion. Neck supple. No JVD present. No tracheal deviation present. No thyromegaly present.  Cardiovascular: Normal rate, regular rhythm, normal heart sounds and intact distal pulses.  Exam reveals no gallop and no friction rub.   No murmur heard. Pulmonary/Chest: Effort normal and breath sounds normal. No stridor. No respiratory distress. She has no wheezes. She has no rales. She exhibits no tenderness.  Abdominal: Soft. Bowel sounds are normal. She exhibits no distension and no mass. There is no  tenderness. There is no rebound and no guarding.  Musculoskeletal: Normal range of motion. She exhibits no edema and no tenderness.       Lumbar back: She exhibits normal range of motion, no tenderness, no bony tenderness, no swelling, no edema, no deformity, no laceration, no pain, no spasm and normal pulse.       Back:  Lymphadenopathy:    She has no cervical adenopathy.  Neurological: She is oriented to person, place, and time.  Skin: Skin is warm and dry. No rash noted. She is not diaphoretic. No erythema. No pallor.  Psychiatric: She has a normal mood and affect. Her behavior is normal. Judgment and thought content normal.     Lab Results  Component Value Date   WBC 5.6 08/08/2012   HGB 13.9 08/08/2012   HCT 41.2 08/08/2012   PLT 238.0 08/08/2012   GLUCOSE 90 08/08/2012   CHOL 164 12/31/2011   TRIG 129.0 12/31/2011   HDL 40.60 12/31/2011   LDLCALC 98 12/31/2011   ALT 30 08/08/2012   AST 34 08/08/2012   NA 138 08/08/2012   K 4.1 08/08/2012   CL 107 08/08/2012   CREATININE 0.8 08/08/2012   BUN 6 08/08/2012   CO2 24 08/08/2012       Assessment & Plan:

## 2013-03-02 NOTE — Assessment & Plan Note (Signed)
She may have a mild smoldering infection but I do not see anything to drain today She will start amoxil for infection She wants to see a surgeon to see if the pilonidal cyst can be removed

## 2013-03-04 ENCOUNTER — Telehealth: Payer: Self-pay

## 2013-03-04 NOTE — Telephone Encounter (Signed)
Patient called. C/o spotting and bleeding since 12/22/12 with the exception of a few days. She said she recently had her Nexplanon removed and is questioning should she have another Nexplanon inserted.  I called her back and received her voice mail and I left a message that she should schedule appointment to come and see Dr. Glenetta Hew to have the bleeding assessed and make a treatment plan.

## 2013-03-05 ENCOUNTER — Ambulatory Visit (INDEPENDENT_AMBULATORY_CARE_PROVIDER_SITE_OTHER): Payer: Managed Care, Other (non HMO) | Admitting: Surgery

## 2013-03-05 ENCOUNTER — Encounter (INDEPENDENT_AMBULATORY_CARE_PROVIDER_SITE_OTHER): Payer: Self-pay | Admitting: Surgery

## 2013-03-05 VITALS — BP 144/82 | HR 88 | Resp 16 | Ht 62.5 in | Wt 233.8 lb

## 2013-03-05 DIAGNOSIS — L0591 Pilonidal cyst without abscess: Secondary | ICD-10-CM

## 2013-03-05 NOTE — Progress Notes (Signed)
Patient ID: Traci Peterson, female   DOB: Oct 09, 1985, 27 y.o.   MRN: [id number]  Chief Complaint  Patient presents with  . New Evaluation    eval pilo cyst without abscess    HPI Traci Peterson is a 27 y.o. female.   HPI This is a very pleasant female referred by Dr. Sanda Linger for evaluation of her recurrent pilonidal abscess. It recurred the first time approximately a year ago and she had to have an incision and drainage. She started to have discomfort last week and saw Dr. Yetta Barre on 6. She was having no drainage but just pain. He prescribed antibiotics. She actually did not even start antibiotics. She is now pain-free today. She is otherwise without complaints Past Medical History  Diagnosis Date  . Depression     Past Surgical History  Procedure Laterality Date  . Dental surgery    . Implanon insertion  12/2009    Family History  Problem Relation Age of Onset  . Hypertension Mother   . Hyperlipidemia Mother   . Diabetes Mother   . Thyroid disease Mother   . Diabetes Father   . Hypertension Father   . Alcohol abuse Father   . Early death Neg Hx   . Heart disease Neg Hx   . Kidney disease Neg Hx   . Stroke Neg Hx   . Breast cancer Maternal Aunt 48  . Cancer Maternal Aunt     breast  . Breast cancer Maternal Aunt 50  . Cancer Maternal Aunt     breast  . Cancer Paternal Grandfather     lung    Social History History  Substance Use Topics  . Smoking status: Current Some Day Smoker -- 0.25 packs/day    Types: Cigarettes  . Smokeless tobacco: Never Used  . Alcohol Use: 0.6 oz/week    1 Glasses of wine per week     Comment: socially    Allergies  Allergen Reactions  . Orange Syrup Hives and Shortness Of Breath    Current Outpatient Prescriptions  Medication Sig Dispense Refill  . etonogestrel (IMPLANON) 68 MG IMPL implant Inject 1 each into the skin once.      Marland Kitchen ketoconazole (NIZORAL) 2 % cream Apply topically 2 (two) times daily.  60 g  2  .  amoxicillin (AMOXIL) 875 MG tablet Take 1 tablet (875 mg total) by mouth 2 (two) times daily.  20 tablet  0   Current Facility-Administered Medications  Medication Dose Route Frequency Provider Last Rate Last Dose  . levonorgestrel (MIRENA) 20 MCG/24HR IUD   Intrauterine Once Ok Edwards, MD        Review of Systems Review of Systems  Constitutional: Negative for fever, chills and unexpected weight change.  HENT: Negative for congestion, hearing loss, sore throat, trouble swallowing and voice change.   Eyes: Negative for visual disturbance.  Respiratory: Negative for cough and wheezing.   Cardiovascular: Negative for chest pain, palpitations and leg swelling.  Gastrointestinal: Negative for nausea, vomiting, abdominal pain, diarrhea, constipation, blood in stool, abdominal distention and anal bleeding.  Genitourinary: Negative for hematuria, vaginal bleeding and difficulty urinating.  Musculoskeletal: Negative for arthralgias.  Skin: Negative for rash and wound.  Neurological: Negative for seizures, syncope and headaches.  Hematological: Negative for adenopathy. Does not bruise/bleed easily.  Psychiatric/Behavioral: Negative for confusion.    Blood pressure 144/82, pulse 88, resp. rate 16, height 5' 2.5" (1.588 m), weight 233 lb 12.8 oz (106.051 kg).  Physical Exam Physical Exam  Constitutional: She is oriented to person, place, and time. She appears well-developed and well-nourished. No distress.  HENT:  Head: Normocephalic and atraumatic.  Left Ear: External ear normal.  Nose: Nose normal.  Mouth/Throat: Oropharynx is clear and moist. No oropharyngeal exudate.  Eyes: Conjunctivae are normal. Pupils are equal, round, and reactive to light. Right eye exhibits no discharge. Left eye exhibits no discharge. No scleral icterus.  Neck: Normal range of motion. Neck supple. No tracheal deviation present. No thyromegaly present.  Cardiovascular: Normal rate, regular rhythm, normal  heart sounds and intact distal pulses.   No murmur heard. Pulmonary/Chest: Effort normal and breath sounds normal. No respiratory distress. She has no wheezes.  Abdominal: Soft. Bowel sounds are normal. She exhibits no distension. There is no tenderness.  Musculoskeletal: Normal range of motion. She exhibits no edema and no tenderness.  Lymphadenopathy:    She has no cervical adenopathy.  Neurological: She is alert and oriented to person, place, and time.  Skin: Skin is warm and dry. She is not diaphoretic. No erythema.  Psychiatric: Her behavior is normal. Judgment normal.  There is a small opening at her gluteal cleft consistent with an ingrown hair. There is no erythema, tenderness, or drainage  Data Reviewed   Assessment    Chronic pilonidal cyst     Plan    I discussed the diagnosis with her. I discussed continued expectant management versus excision of this area in the operating room. I discussed the surgical procedure in detail. She wished to go ahead and proceed with surgery. I discussed the risk which includes but is not limited to bleeding, infection, having a chronic open wound, recurrence, etc. She understands and wishes to proceed        [name] A 03/05/2013, 3:35 PM

## 2013-03-06 ENCOUNTER — Encounter: Payer: Self-pay | Admitting: Gynecology

## 2013-03-06 ENCOUNTER — Encounter (HOSPITAL_BASED_OUTPATIENT_CLINIC_OR_DEPARTMENT_OTHER): Payer: Self-pay | Admitting: *Deleted

## 2013-03-06 ENCOUNTER — Ambulatory Visit (INDEPENDENT_AMBULATORY_CARE_PROVIDER_SITE_OTHER): Payer: Managed Care, Other (non HMO) | Admitting: Gynecology

## 2013-03-06 DIAGNOSIS — Z3046 Encounter for surveillance of implantable subdermal contraceptive: Secondary | ICD-10-CM

## 2013-03-06 DIAGNOSIS — N938 Other specified abnormal uterine and vaginal bleeding: Secondary | ICD-10-CM

## 2013-03-06 DIAGNOSIS — N949 Unspecified condition associated with female genital organs and menstrual cycle: Secondary | ICD-10-CM

## 2013-03-06 MED ORDER — MEGESTROL ACETATE 40 MG PO TABS: ORAL_TABLET | ORAL | Status: DC

## 2013-03-06 MED ORDER — CEPHALEXIN 500 MG PO CAPS: ORAL_CAPSULE | ORAL | Status: DC

## 2013-03-06 MED ORDER — FLUCONAZOLE 150 MG PO TABS: 150.0000 mg | ORAL_TABLET | Freq: Once | ORAL | Status: DC

## 2013-03-06 NOTE — Patient Instructions (Addendum)
Intrauterine Device Information  An intrauterine device (IUD) is inserted into your uterus and prevents pregnancy. There are 2 types of IUDs available:  · Copper IUD. This type of IUD is wrapped in copper wire and is placed inside the uterus. Copper makes the uterus and fallopian tubes produce a fluid that kills sperm. The copper IUD can stay in place for 10 years.  · Hormone IUD. This type of IUD contains the hormone progestin (synthetic progesterone). The hormone thickens the cervical mucus and prevents sperm from entering the uterus, and it also thins the uterine lining to prevent implantation of a fertilized egg. The hormone can weaken or kill the sperm that get into the uterus. The hormone IUD can stay in place for 5 years.  Your caregiver will make sure you are a good candidate for a contraceptive IUD. Discuss with your caregiver the possible side effects.  ADVANTAGES  · It is highly effective, reversible, long-acting, and low maintenance.  · There are no estrogen-related side effects.  · An IUD can be used when breastfeeding.  · It is not associated with weight gain.  · It works immediately after insertion.  · The copper IUD does not interfere with your female hormones.  · The progesterone IUD can make heavy menstrual periods lighter.  · The progesterone IUD can be used for 5 years.  · The copper IUD can be used for 10 years.  DISADVANTAGES  · The progesterone IUD can be associated with irregular bleeding patterns.  · The copper IUD can make your menstrual flow heavier and more painful.  · You may experience cramping and vaginal bleeding after insertion.  Document Released: 04/17/2004 Document Revised: 08/06/2011 Document Reviewed: 09/16/2010  [employer]® Patient Information ©2014 [employer].

## 2013-03-06 NOTE — Progress Notes (Signed)
Patient presented to the office today requesting to have her Nexplanon removed from her left arm which was placed in September of this year. She states that she has been bleeding since then. She would like to go on Friendly IUD instead at a later date.  Review of her records indicated on the note on September 10 the following: At time of her annual exam 01/07/2013 her Pap smear had demonstrated atypical squamous cells of undetermined significance With the following: HPV 16,18/45 Genotyping  "POSITIVE" HPV 18/45  Completed by [id number] on 2013-01-15  HPV High Risk  **DETECTED  Patient was supposed to return in 2 weeks after that for colposcopy and has not done so is requesting now to have the subdermal implant removed.                         Nexplanon procedure note (removal)  The patient presented to the office today requesting for removal of her Nexplanon that was placed in the year 2014 on her left arm.   On examination the nexplanon implant was palpated and the distal end  (end  closest to the elbow) was marked. The area was sterilized with Betadine solution. 1% lidocaine was used for local anesthesia and approximately 1 cc  was injected into the site that was marked where  the incision was to be made. The local anesthetic was injected under the implant in an effort to keep it  close to the skin surface. Slight pressure pushing downward was made at the proximal end  of the implant in an effort to stabilize it. A bulge appeared indicating the distal end of the implant. Starting at the distal tip of the implant, a small longitudinal incision of 2 mm was made towards the elbow. By gently pushing the implant toward the incision the tip became visible. Grasping the implant with a curved forcep facilitated in gently removing the implant. Full confirmation of the entire implant which is 4 cm long was inspected and was intact and was shown to the patient and discarded. After removing the implant, the incision  was was reapproximated with 2 interrupted sutures of 3-0 Vicryl suture. Dressing and Kerlix wrap was placed. Patient will remove the Kerlix wrap in 24 hours. She'll return back to the office next week to have her suture removed.  Literature and information was provided nonreactive D. She was scheduled at a later date. And she will be reminded once again that she needs for colposcopic evaluation of her cervix. Note: This dictation was prepared with  Dragon/digital dictation along withSmart phrase technology. Any transcriptional errors that result from this process are unintentional.

## 2013-03-12 ENCOUNTER — Encounter (HOSPITAL_BASED_OUTPATIENT_CLINIC_OR_DEPARTMENT_OTHER)
Admission: RE | Disposition: A | Discharge status: Home / Self Care | Payer: Self-pay | Source: Ambulatory Visit | Attending: Surgery

## 2013-03-12 ENCOUNTER — Encounter (HOSPITAL_BASED_OUTPATIENT_CLINIC_OR_DEPARTMENT_OTHER): Payer: Managed Care, Other (non HMO) | Admitting: Anesthesiology

## 2013-03-12 ENCOUNTER — Ambulatory Visit (HOSPITAL_BASED_OUTPATIENT_CLINIC_OR_DEPARTMENT_OTHER)
Admission: RE | Admit: 2013-03-12 | Discharge: 2013-03-12 | Disposition: A | Discharge status: Home / Self Care | Payer: Managed Care, Other (non HMO) | Source: Ambulatory Visit | Attending: Surgery | Admitting: Surgery

## 2013-03-12 ENCOUNTER — Ambulatory Visit (HOSPITAL_BASED_OUTPATIENT_CLINIC_OR_DEPARTMENT_OTHER): Payer: Managed Care, Other (non HMO) | Admitting: Anesthesiology

## 2013-03-12 ENCOUNTER — Encounter (HOSPITAL_BASED_OUTPATIENT_CLINIC_OR_DEPARTMENT_OTHER): Payer: Self-pay | Admitting: Anesthesiology

## 2013-03-12 DIAGNOSIS — L0501 Pilonidal cyst with abscess: Secondary | ICD-10-CM | POA: Insufficient documentation

## 2013-03-12 DIAGNOSIS — F17200 Nicotine dependence, unspecified, uncomplicated: Secondary | ICD-10-CM | POA: Insufficient documentation

## 2013-03-12 DIAGNOSIS — Z79899 Other long term (current) drug therapy: Secondary | ICD-10-CM | POA: Insufficient documentation

## 2013-03-12 HISTORY — DX: Other specified disorders of temporomandibular joint: M26.69

## 2013-03-12 HISTORY — DX: Pilonidal cyst without abscess: L05.91

## 2013-03-12 HISTORY — DX: Anxiety disorder, unspecified: F41.9

## 2013-03-12 HISTORY — PX: PILONIDAL CYST EXCISION: SHX744

## 2013-03-12 LAB — POCT HEMOGLOBIN-HEMACUE: Hemoglobin: 13.8 g/dL (ref 12.0–15.0)

## 2013-03-12 SURGERY — EXCISION, PILONIDAL CYST, EXTENSIVE
Anesthesia: General | Site: Coccyx | Wound class: Clean

## 2013-03-12 MED ORDER — MIDAZOLAM HCL 5 MG/5ML IJ SOLN
INTRAMUSCULAR | Status: DC | PRN
  Administered 2013-03-12: 2 mg via INTRAVENOUS

## 2013-03-12 MED ORDER — CEFAZOLIN SODIUM-DEXTROSE 2-3 GM-% IV SOLR
INTRAVENOUS | Status: AC
  Filled 2013-03-12: qty 50

## 2013-03-12 MED ORDER — FLUCONAZOLE 150 MG PO TABS: 150.0000 mg | ORAL_TABLET | Freq: Once | ORAL | Status: DC

## 2013-03-12 MED ORDER — CEPHALEXIN 500 MG PO CAPS: 500.0000 mg | ORAL_CAPSULE | Freq: Three times a day (TID) | ORAL | Status: DC

## 2013-03-12 MED ORDER — BUPIVACAINE-EPINEPHRINE 0.5% -1:200000 IJ SOLN
INTRAMUSCULAR | Status: DC | PRN
  Administered 2013-03-12: 20 mL

## 2013-03-12 MED ORDER — LIDOCAINE HCL (CARDIAC) 20 MG/ML IV SOLN
INTRAVENOUS | Status: DC | PRN
  Administered 2013-03-12: 100 mg via INTRAVENOUS

## 2013-03-12 MED ORDER — SUCCINYLCHOLINE CHLORIDE 20 MG/ML IJ SOLN
INTRAMUSCULAR | Status: DC | PRN
  Administered 2013-03-12: 100 mg via INTRAVENOUS

## 2013-03-12 MED ORDER — LACTATED RINGERS IV SOLN
INTRAVENOUS | Status: DC
  Administered 2013-03-12 (×2): via INTRAVENOUS

## 2013-03-12 MED ORDER — HYDROMORPHONE HCL PF 1 MG/ML IJ SOLN
0.2500 mg | INTRAMUSCULAR | Status: DC | PRN
  Administered 2013-03-12: 0.5 mg via INTRAVENOUS

## 2013-03-12 MED ORDER — MIDAZOLAM HCL 2 MG/2ML IJ SOLN
INTRAMUSCULAR | Status: AC
  Filled 2013-03-12: qty 2

## 2013-03-12 MED ORDER — BACITRACIN-NEOMYCIN-POLYMYXIN 400-5-5000 EX OINT
TOPICAL_OINTMENT | CUTANEOUS | Status: DC | PRN
  Administered 2013-03-12: 1 via TOPICAL

## 2013-03-12 MED ORDER — KETOROLAC TROMETHAMINE 30 MG/ML IJ SOLN
INTRAMUSCULAR | Status: DC | PRN
  Administered 2013-03-12: 30 mg via INTRAVENOUS

## 2013-03-12 MED ORDER — FENTANYL CITRATE 0.05 MG/ML IJ SOLN
INTRAMUSCULAR | Status: AC
  Filled 2013-03-12: qty 4

## 2013-03-12 MED ORDER — ETOMIDATE 2 MG/ML IV SOLN
INTRAVENOUS | Status: AC
  Filled 2013-03-12: qty 10

## 2013-03-12 MED ORDER — FENTANYL CITRATE 0.05 MG/ML IJ SOLN: 50.0000 ug | INTRAMUSCULAR | Status: DC | PRN

## 2013-03-12 MED ORDER — DEXAMETHASONE SODIUM PHOSPHATE 4 MG/ML IJ SOLN
INTRAMUSCULAR | Status: DC | PRN
  Administered 2013-03-12: 10 mg via INTRAVENOUS

## 2013-03-12 MED ORDER — METOCLOPRAMIDE HCL 5 MG/ML IJ SOLN: 10.0000 mg | Freq: Once | INTRAMUSCULAR | Status: DC | PRN

## 2013-03-12 MED ORDER — MIDAZOLAM HCL 2 MG/ML PO SYRP: 12.0000 mg | ORAL_SOLUTION | Freq: Once | ORAL | Status: DC | PRN

## 2013-03-12 MED ORDER — ONDANSETRON HCL 4 MG/2ML IJ SOLN
INTRAMUSCULAR | Status: DC | PRN
  Administered 2013-03-12: 4 mg via INTRAMUSCULAR

## 2013-03-12 MED ORDER — SUCCINYLCHOLINE CHLORIDE 20 MG/ML IJ SOLN
INTRAMUSCULAR | Status: AC
  Filled 2013-03-12: qty 1

## 2013-03-12 MED ORDER — ETOMIDATE 2 MG/ML IV SOLN
INTRAVENOUS | Status: DC | PRN
  Administered 2013-03-12: 16 mg via INTRAVENOUS
  Administered 2013-03-12: 4 mg via INTRAVENOUS

## 2013-03-12 MED ORDER — CEFAZOLIN SODIUM-DEXTROSE 2-3 GM-% IV SOLR
2.0000 g | INTRAVENOUS | Status: AC
  Administered 2013-03-12: 2 g via INTRAVENOUS

## 2013-03-12 MED ORDER — HYDROMORPHONE HCL PF 1 MG/ML IJ SOLN
INTRAMUSCULAR | Status: AC
  Filled 2013-03-12: qty 1

## 2013-03-12 MED ORDER — OXYCODONE-ACETAMINOPHEN 5-325 MG PO TABS: 2.0000 | ORAL_TABLET | ORAL | Status: DC | PRN

## 2013-03-12 MED ORDER — FENTANYL CITRATE 0.05 MG/ML IJ SOLN
INTRAMUSCULAR | Status: DC | PRN
  Administered 2013-03-12 (×2): 100 ug via INTRAVENOUS

## 2013-03-12 MED ORDER — BUPIVACAINE-EPINEPHRINE PF 0.5-1:200000 % IJ SOLN
INTRAMUSCULAR | Status: AC
  Filled 2013-03-12: qty 30

## 2013-03-12 MED ORDER — MIDAZOLAM HCL 2 MG/2ML IJ SOLN: 1.0000 mg | INTRAMUSCULAR | Status: DC | PRN

## 2013-03-12 MED ORDER — BACITRACIN-NEOMYCIN-POLYMYXIN 400-5-5000 EX OINT
TOPICAL_OINTMENT | CUTANEOUS | Status: AC
  Filled 2013-03-12: qty 1

## 2013-03-12 SURGICAL SUPPLY — 53 items
BENZOIN TINCTURE PRP APPL 2/3 (GAUZE/BANDAGES/DRESSINGS) IMPLANT
BLADE SURG 15 STRL LF DISP TIS (BLADE) ×1 IMPLANT
BLADE SURG 15 STRL SS (BLADE) ×1
BLADE SURG ROTATE 9660 (MISCELLANEOUS) ×2 IMPLANT
CANISTER SUCT 1200ML W/VALVE (MISCELLANEOUS) IMPLANT
CHLORAPREP W/TINT 26ML (MISCELLANEOUS) ×2 IMPLANT
CLEANER CAUTERY TIP 5X5 PAD (MISCELLANEOUS) IMPLANT
CLOTH BEACON ORANGE TIMEOUT ST (SAFETY) IMPLANT
COVER MAYO STAND STRL (DRAPES) ×2 IMPLANT
COVER TABLE BACK 60X90 (DRAPES) ×2 IMPLANT
DECANTER SPIKE VIAL GLASS SM (MISCELLANEOUS) IMPLANT
DRAIN CHANNEL 10F 3/8 F FF (DRAIN) IMPLANT
DRAIN PENROSE 1/4X12 LTX STRL (WOUND CARE) IMPLANT
DRAPE LAPAROTOMY T 102X78X121 (DRAPES) ×2 IMPLANT
DRAPE UTILITY XL STRL (DRAPES) ×2 IMPLANT
DRSG PAD ABDOMINAL 8X10 ST (GAUZE/BANDAGES/DRESSINGS) IMPLANT
ELECT REM PT RETURN 9FT ADLT (ELECTROSURGICAL) ×2
ELECTRODE REM PT RTRN 9FT ADLT (ELECTROSURGICAL) ×1 IMPLANT
EVACUATOR SILICONE 100CC (DRAIN) IMPLANT
GAUZE SPONGE 4X4 12PLY STRL LF (GAUZE/BANDAGES/DRESSINGS) ×4 IMPLANT
GAUZE SPONGE 4X4 16PLY XRAY LF (GAUZE/BANDAGES/DRESSINGS) IMPLANT
GLOVE BIO SURGEON STRL SZ 6.5 (GLOVE) ×2 IMPLANT
GLOVE BIOGEL PI IND STRL 7.0 (GLOVE) ×1 IMPLANT
GLOVE BIOGEL PI INDICATOR 7.0 (GLOVE) ×1
GLOVE SURG SIGNA 7.5 PF LTX (GLOVE) ×2 IMPLANT
GOWN PREVENTION PLUS XLARGE (GOWN DISPOSABLE) ×2 IMPLANT
NEEDLE HYPO 22GX1.5 SAFETY (NEEDLE) ×2 IMPLANT
NS IRRIG 1000ML POUR BTL (IV SOLUTION) ×2 IMPLANT
PACK BASIN DAY SURGERY FS (CUSTOM PROCEDURE TRAY) ×2 IMPLANT
PAD CLEANER CAUTERY TIP 5X5 (MISCELLANEOUS)
PENCIL BUTTON HOLSTER BLD 10FT (ELECTRODE) ×2 IMPLANT
SUCTION FRAZIER TIP 10 FR DISP (SUCTIONS) IMPLANT
SUT CHROMIC 3 0 SH 27 (SUTURE) ×2 IMPLANT
SUT ETHILON 2 0 FS 18 (SUTURE) ×4 IMPLANT
SUT ETHILON 3 0 FSL (SUTURE) IMPLANT
SUT ETHILON 4 0 PS 2 18 (SUTURE) IMPLANT
SUT MNCRL AB 3-0 PS2 18 (SUTURE) IMPLANT
SUT MON AB 2-0 CT1 36 (SUTURE) IMPLANT
SUT VIC AB 2-0 CT1 27 (SUTURE)
SUT VIC AB 2-0 CT1 TAPERPNT 27 (SUTURE) IMPLANT
SUT VIC AB 2-0 SH 27 (SUTURE) ×1
SUT VIC AB 2-0 SH 27XBRD (SUTURE) ×1 IMPLANT
SUT VIC AB 3-0 CT1 27 (SUTURE)
SUT VIC AB 3-0 CT1 27XBRD (SUTURE) IMPLANT
SUT VIC AB 4-0 SH 18 (SUTURE) IMPLANT
SUT VICRYL 4-0 PS2 18IN ABS (SUTURE) ×2 IMPLANT
SWAB COLLECTION DEVICE MRSA (MISCELLANEOUS) IMPLANT
SYR CONTROL 10ML LL (SYRINGE) ×2 IMPLANT
TAPE CLOTH 3X10 TAN LF (GAUZE/BANDAGES/DRESSINGS) ×2 IMPLANT
TOWEL OR 17X24 6PK STRL BLUE (TOWEL DISPOSABLE) ×2 IMPLANT
TOWEL OR NON WOVEN STRL DISP B (DISPOSABLE) IMPLANT
TUBE CONNECTING 20X1/4 (TUBING) ×2 IMPLANT
YANKAUER SUCT BULB TIP NO VENT (SUCTIONS) ×2 IMPLANT

## 2013-03-12 NOTE — H&P (Signed)
Patient ID: Traci Peterson, female DOB: Oct 21, 1985, 27 y.o. MRN: [id number]  Chief Complaint   Patient presents with   .  New Evaluation     eval pilo cyst without abscess   HPI  Traci Peterson is a 27 y.o. female.  HPI  This is a very pleasant female referred by Dr. Sanda Linger for evaluation of her recurrent pilonidal abscess. It recurred the first time approximately a year ago and she had to have an incision and drainage. She started to have discomfort last week and saw Dr. Yetta Barre on 6. She was having no drainage but just pain. He prescribed antibiotics. She actually did not even start antibiotics. She is now pain-free today. She is otherwise without complaints  Past Medical History   Diagnosis  Date   .  Depression     Past Surgical History   Procedure  Laterality  Date   .  Dental surgery     .  Implanon insertion   12/2009    Family History   Problem  Relation  Age of Onset   .  Hypertension  Mother    .  Hyperlipidemia  Mother    .  Diabetes  Mother    .  Thyroid disease  Mother    .  Diabetes  Father    .  Hypertension  Father    .  Alcohol abuse  Father    .  Early death  Neg Hx    .  Heart disease  Neg Hx    .  Kidney disease  Neg Hx    .  Stroke  Neg Hx    .  Breast cancer  Maternal Aunt  48   .  Cancer  Maternal Aunt      breast   .  Breast cancer  Maternal Aunt  50   .  Cancer  Maternal Aunt      breast   .  Cancer  Paternal Grandfather      lung   Social History  History   Substance Use Topics   .  Smoking status:  Current Some Day Smoker -- 0.25 packs/day     Types:  Cigarettes   .  Smokeless tobacco:  Never Used   .  Alcohol Use:  0.6 oz/week     1 Glasses of wine per week      Comment: socially    Allergies   Allergen  Reactions   .  Orange Syrup  Hives and Shortness Of Breath    Current Outpatient Prescriptions   Medication  Sig  Dispense  Refill   .  etonogestrel (IMPLANON) 68 MG IMPL implant  Inject 1 each into the skin once.     Marland Kitchen   ketoconazole (NIZORAL) 2 % cream  Apply topically 2 (two) times daily.  60 g  2   .  amoxicillin (AMOXIL) 875 MG tablet  Take 1 tablet (875 mg total) by mouth 2 (two) times daily.  20 tablet  0    Current Facility-Administered Medications   Medication  Dose  Route  Frequency  Provider  Last Rate  Last Dose   .  levonorgestrel (MIRENA) 20 MCG/24HR IUD   Intrauterine  Once  Ok Edwards, MD     Review of Systems  Review of Systems  Constitutional: Negative for fever, chills and unexpected weight change.  HENT: Negative for congestion, hearing loss, sore throat, trouble swallowing and voice change.  Eyes: Negative for visual disturbance.  Respiratory: Negative for cough and wheezing.  Cardiovascular: Negative for chest pain, palpitations and leg swelling.  Gastrointestinal: Negative for nausea, vomiting, abdominal pain, diarrhea, constipation, blood in stool, abdominal distention and anal bleeding.  Genitourinary: Negative for hematuria, vaginal bleeding and difficulty urinating.  Musculoskeletal: Negative for arthralgias.  Skin: Negative for rash and wound.  Neurological: Negative for seizures, syncope and headaches.  Hematological: Negative for adenopathy. Does not bruise/bleed easily.  Psychiatric/Behavioral: Negative for confusion.  Blood pressure 144/82, pulse 88, resp. rate 16, height 5' 2.5" (1.588 m), weight 233 lb 12.8 oz (106.051 kg).  Physical Exam  Physical Exam  Constitutional: She is oriented to person, place, and time. She appears well-developed and well-nourished. No distress.  HENT:  Head: Normocephalic and atraumatic.  Left Ear: External ear normal.  Nose: Nose normal.  Mouth/Throat: Oropharynx is clear and moist. No oropharyngeal exudate.  Eyes: Conjunctivae are normal. Pupils are equal, round, and reactive to light. Right eye exhibits no discharge. Left eye exhibits no discharge. No scleral icterus.  Neck: Normal range of motion. Neck supple. No tracheal deviation  present. No thyromegaly present.  Cardiovascular: Normal rate, regular rhythm, normal heart sounds and intact distal pulses.  No murmur heard.  Pulmonary/Chest: Effort normal and breath sounds normal. No respiratory distress. She has no wheezes.  Abdominal: Soft. Bowel sounds are normal. She exhibits no distension. There is no tenderness.  Musculoskeletal: Normal range of motion. She exhibits no edema and no tenderness.  Lymphadenopathy:  She has no cervical adenopathy.  Neurological: She is alert and oriented to person, place, and time.  Skin: Skin is warm and dry. She is not diaphoretic. No erythema.  Psychiatric: Her behavior is normal. Judgment normal.  There is a small opening at her gluteal cleft consistent with an ingrown hair. There is no erythema, tenderness, or drainage  Data Reviewed  Assessment  Chronic pilonidal cyst  Plan  I discussed the diagnosis with her. I discussed continued expectant management versus excision of this area in the operating room. I discussed the surgical procedure in detail. She wished to go ahead and proceed with surgery. I discussed the risk which includes but is not limited to bleeding, infection, having a chronic open wound, recurrence, etc. She understands and wishes to proceed

## 2013-03-12 NOTE — Transfer of Care (Signed)
Immediate Anesthesia Transfer of Care Note  Patient: Traci Peterson  Procedure(s) Performed: Procedure(s): CYST EXCISION PILONIDAL EXTENSIVE (N/A)  Patient Location: PACU  Anesthesia Type:General  Level of Consciousness: awake, alert  and oriented  Airway & Oxygen Therapy: Patient Spontanous Breathing and Patient connected to face mask oxygen  Post-op Assessment: Report given to PACU RN and Post -op Vital signs reviewed and stable  Post vital signs: Reviewed and stable  Complications: No apparent anesthesia complications

## 2013-03-12 NOTE — Anesthesia Postprocedure Evaluation (Signed)
Anesthesia Post Note  Patient: Traci Peterson  Procedure(s) Performed: Procedure(s) (LRB): CYST EXCISION PILONIDAL EXTENSIVE (N/A)  Anesthesia type: General  Patient location: PACU  Post pain: Pain level controlled  Post assessment: Patient's Cardiovascular Status Stable  Last Vitals:  Filed Vitals:   03/12/13 1630  BP: 115/62  Pulse: 85  Temp:   Resp: 20    Post vital signs: Reviewed and stable  Level of consciousness: alert  Complications: No apparent anesthesia complications

## 2013-03-12 NOTE — Op Note (Signed)
CYST EXCISION PILONIDAL EXTENSIVE  Procedure Note  Traci Peterson 03/12/2013   Pre-op Diagnosis: pilonidal cyst     Post-op Diagnosis: same  Procedure(s): CYST EXCISION PILONIDAL EXTENSIVE  Surgeon(s): Shelly Rubenstein, MD  Anesthesia: General  Staff:  Circulator: Aleen Sells, RN Relief Circulator: Amy Adela Lank, RN Scrub Person: Herminio Commons, RN  Estimated Blood Loss: Minimal               Specimens: sent to path          [facility]   Date: 03/12/2013  Time: 3:34 PM

## 2013-03-12 NOTE — Anesthesia Preprocedure Evaluation (Signed)
Anesthesia Evaluation  Patient identified by MRN, date of birth, ID band Patient awake    Reviewed: Allergy & Precautions, H&P , NPO status , Patient's Chart, lab work & pertinent test results, reviewed documented beta blocker date and time   Airway Mallampati: II TM Distance: >3 FB Neck ROM: full    Dental   Pulmonary Current Smoker,  breath sounds clear to auscultation        Cardiovascular negative cardio ROS  Rhythm:regular     Neuro/Psych negative neurological ROS  negative psych ROS   GI/Hepatic negative GI ROS, Neg liver ROS,   Endo/Other  Morbid obesity  Renal/GU negative Renal ROS  negative genitourinary   Musculoskeletal   Abdominal   Peds  Hematology negative hematology ROS (+)   Anesthesia Other Findings See surgeon's H&P   Reproductive/Obstetrics negative OB ROS                           Anesthesia Physical Anesthesia Plan  ASA: III  Anesthesia Plan: General   Post-op Pain Management:    Induction: Intravenous  Airway Management Planned: Oral ETT  Additional Equipment:   Intra-op Plan:   Post-operative Plan: Extubation in OR  Informed Consent: I have reviewed the patients History and Physical, chart, labs and discussed the procedure including the risks, benefits and alternatives for the proposed anesthesia with the patient or authorized representative who has indicated his/her understanding and acceptance.   Dental Advisory Given  Plan Discussed with: CRNA and Surgeon  Anesthesia Plan Comments: (Amidate for induction. CF)        Anesthesia Quick Evaluation

## 2013-03-13 ENCOUNTER — Other Ambulatory Visit: Payer: Self-pay | Admitting: Gynecology

## 2013-03-13 ENCOUNTER — Telehealth: Payer: Self-pay | Admitting: Gynecology

## 2013-03-13 DIAGNOSIS — Z3049 Encounter for surveillance of other contraceptives: Secondary | ICD-10-CM

## 2013-03-13 MED ORDER — LEVONORGESTREL 20 MCG/24HR IU IUD: INTRAUTERINE_SYSTEM | Freq: Once | INTRAUTERINE | Status: DC

## 2013-03-13 NOTE — Op Note (Signed)
NAME:  Traci Peterson, VANDERSLICE NO.:  [id number]  MEDICAL RECORD NO.:  [id number]  LOCATION:                               FACILITY:  [facility]  PHYSICIAN:  Abigail Miyamoto, M.D. DATE OF BIRTH:  1986/02/07  DATE OF PROCEDURE:  03/12/2013 DATE OF DISCHARGE:  03/12/2013                              OPERATIVE REPORT   PREOPERATIVE DIAGNOSIS:  Pilonidal cyst.  POSTOPERATIVE DIAGNOSIS:  Pilonidal cyst.  PROCEDURE:  Excision of pilonidal cyst.  SURGEON:  Abigail Miyamoto, M.D.  ANESTHESIA:  General and 0.5% Marcaine with epinephrine.  ESTIMATED BLOOD LOSS:  Minimal.  PROCEDURE IN DETAIL:  The patient was brought to the operating room, identified as [employer].  She was placed supine on the operating room table and general anesthesia was induced.  The patient was then placed in the prone position.  Her gluteal cleft and buttocks were then prepped and draped in usual sterile fashion.  I performed an elliptical incision in the skin at the gluteal cleft incorporating 2 ingrown hair and sinus tracts.  I took this down in to the subcutaneous tissue with electrocautery.  I then removed all the tissue at the gluteal cleft down to the underlying bone.  The specimen was then sent to pathology for evaluation.  I irrigated the wound with saline extensively.  I then anesthetized the wound circumferentially with Marcaine.  Hemostasis was achieved with the cautery.  I then closed subcutaneous tissue with interrupted 2-0 Vicryl sutures and closed the skin with interrupted 2-0 nylon sutures.  Neosporin gauze and tape were then applied.  The patient tolerated the procedure well.  All counts were correct at the end of procedure.  The patient was then extubated in operating room and taken in stable condition to recovery room.     Abigail Miyamoto, M.D.   ______________________________ Abigail Miyamoto, M.D.    DB/MEDQ  D:  03/12/2013  T:  03/13/2013  Job:  [id number]

## 2013-03-13 NOTE — Telephone Encounter (Signed)
03/13/13-Called pt after checking benefits for Mirena that it is covered at 100%. Did explain since she just had Nexplanon put in Sept this year that the ins company did say it should be covered but same disclaimer as usual. She will sign release.Will call start next cycle/WL

## 2013-03-16 ENCOUNTER — Encounter (HOSPITAL_BASED_OUTPATIENT_CLINIC_OR_DEPARTMENT_OTHER): Payer: Self-pay | Admitting: Surgery

## 2013-03-20 ENCOUNTER — Ambulatory Visit (INDEPENDENT_AMBULATORY_CARE_PROVIDER_SITE_OTHER): Payer: Managed Care, Other (non HMO) | Admitting: Gynecology

## 2013-03-20 ENCOUNTER — Encounter: Payer: Self-pay | Admitting: Gynecology

## 2013-03-20 VITALS — BP 122/80

## 2013-03-20 DIAGNOSIS — Z30430 Encounter for insertion of intrauterine contraceptive device: Secondary | ICD-10-CM

## 2013-03-20 DIAGNOSIS — Z975 Presence of (intrauterine) contraceptive device: Secondary | ICD-10-CM

## 2013-03-20 NOTE — Patient Instructions (Signed)

## 2013-03-20 NOTE — Progress Notes (Signed)
IUD procedure note       Patient presented to the office today for placement of Mirena IUD. The patient had previously been provided with literature information on this method of contraception. The risks benefits and pros and cons were discussed and all her questions were answered. She is fully aware that this form of contraception is 99% effective and is good for 5 years.  Pelvic exam: Bartholin urethra Skene glands: Within normal limits Vagina: No lesions or discharge,menstrual blood Cervix: No lesions or discharge Uterus: anteverted position Adnexa: No masses or tenderness Rectal exam: Not done  The cervix was cleansed with Betadine solution. A single-tooth tenaculum was placed on the anterior cervical lip. The uterus sounded to 7-1/2 centimeter. The IUD was shown to the patient and inserted in a sterile fashion. The IUD string was trimmed. The single-tooth tenaculum was removed. Patient was instructed to return back to the office in one month for follow up.     Patient scheduled for followup in the next few weeks for colposcopy as a result of recent Pap smear demonstrated atypical squamous cells of undetermined significance with high risk HPV having been detected.  Lot number: [id number]

## 2013-03-23 ENCOUNTER — Encounter (INDEPENDENT_AMBULATORY_CARE_PROVIDER_SITE_OTHER): Payer: Self-pay | Admitting: Surgery

## 2013-03-23 ENCOUNTER — Other Ambulatory Visit (INDEPENDENT_AMBULATORY_CARE_PROVIDER_SITE_OTHER): Payer: Self-pay | Admitting: Surgery

## 2013-03-23 ENCOUNTER — Encounter: Payer: Self-pay | Admitting: Gynecology

## 2013-03-23 ENCOUNTER — Ambulatory Visit (INDEPENDENT_AMBULATORY_CARE_PROVIDER_SITE_OTHER): Payer: Managed Care, Other (non HMO) | Admitting: Surgery

## 2013-03-23 ENCOUNTER — Telehealth (INDEPENDENT_AMBULATORY_CARE_PROVIDER_SITE_OTHER): Payer: Self-pay | Admitting: General Surgery

## 2013-03-23 VITALS — BP 136/68 | HR 80 | Resp 16 | Ht 62.5 in | Wt 233.8 lb

## 2013-03-23 DIAGNOSIS — Z09 Encounter for follow-up examination after completed treatment for conditions other than malignant neoplasm: Secondary | ICD-10-CM

## 2013-03-23 NOTE — Telephone Encounter (Signed)
Called patient and spoke to her mother who is on [facility] to let her know that her daughter has a written Rx for Percocet 5/325 1-2 po q 6 hours prn for pain #30. I told the mother that the Rx is at the check in desk and who ever comes to get the Rx they will need to show a picture ID. Rx is in the med list in Epic

## 2013-03-23 NOTE — Progress Notes (Signed)
Subjective:     Patient ID: Traci Peterson, female   DOB: [birth year], 27 y.o.   MRN: [id number]  HPI She is here for her first postop visit status post excision of a chronic Pilonidal cyst on October 16. She reports moderate drainage  Review of Systems     Objective:   Physical Exam On exam, the sutures are intact. There is clear drainage coming from the wound without evidence of infection    Assessment:     Patient stable postop     Plan:     I will leave the sutures in one more week. I suspect this will make totally fall apart and the wet-to-dry dressings but we will give it a chance. I renewed her pain medication.

## 2013-03-24 MED ORDER — OXYCODONE-ACETAMINOPHEN 5-325 MG PO TABS: 2.0000 | ORAL_TABLET | ORAL | Status: DC | PRN

## 2013-03-26 ENCOUNTER — Encounter (INDEPENDENT_AMBULATORY_CARE_PROVIDER_SITE_OTHER): Payer: Self-pay | Admitting: General Surgery

## 2013-03-30 ENCOUNTER — Ambulatory Visit (INDEPENDENT_AMBULATORY_CARE_PROVIDER_SITE_OTHER): Payer: Managed Care, Other (non HMO) | Admitting: General Surgery

## 2013-03-30 DIAGNOSIS — Z4802 Encounter for removal of sutures: Secondary | ICD-10-CM

## 2013-03-30 NOTE — Patient Instructions (Addendum)
Keep your follow up with Dr Magnus Ivan on 04/07/2013 at 11:20 am. Per Dr Dwain Sarna - He will evaluate for removal of these sutures at that time. In the meantime- keep this area as clean and dry as possible. Call us prior to appt if you notice redness, increase in drainage, odor in drainage, increased pain at incision or any other symptoms that you feel are concerning.

## 2013-03-30 NOTE — Progress Notes (Signed)
Patient comes in status post pilonidal cystectomy by Dr Magnus Ivan on 03/12/2013 for suture removal. Patient has been feeling well. Her only complain is stabbing from sutures and drainage from incision. I looked at wound and it has a very large amount of drainage - all serosanguinous. She has no redness on skin. Due to drainage - I had Dr Dwain Sarna evaluate wound. He felt the drainage was too much and incision not closed enough to remove sutures at this time. Sutures were left in place. Patient instructed to shower twice a day if she could and make sure and dry this area really well. Patient to keep area covered and change dressing frequently, as needed. Patient instructed to keep follow up appt with Dr Magnus Ivan, for him to evaluate for suture removal next week. Patient is to call sooner if she notices redness around incision, increased drainage or odor to drainage, fevers or increased pain. She will keep appt and call if needed.

## 2013-04-01 ENCOUNTER — Ambulatory Visit: Payer: Managed Care, Other (non HMO) | Admitting: Gynecology

## 2013-04-06 ENCOUNTER — Encounter (INDEPENDENT_AMBULATORY_CARE_PROVIDER_SITE_OTHER): Payer: Managed Care, Other (non HMO) | Admitting: Surgery

## 2013-04-07 ENCOUNTER — Ambulatory Visit (INDEPENDENT_AMBULATORY_CARE_PROVIDER_SITE_OTHER): Payer: Managed Care, Other (non HMO) | Admitting: Surgery

## 2013-04-07 ENCOUNTER — Encounter (INDEPENDENT_AMBULATORY_CARE_PROVIDER_SITE_OTHER): Payer: Self-pay | Admitting: Surgery

## 2013-04-07 VITALS — BP 124/92 | HR 100 | Temp 98.8°F | Resp 16 | Ht 62.5 in | Wt 232.8 lb

## 2013-04-07 DIAGNOSIS — Z09 Encounter for follow-up examination after completed treatment for conditions other than malignant neoplasm: Secondary | ICD-10-CM

## 2013-04-07 NOTE — Progress Notes (Signed)
Subjective:     Patient ID: Traci Peterson, female   DOB: 1985/07/26, 27 y.o.   MRN: [id number]  HPI She is here for another postop check and suture removal. She reports minimal drainage.  Review of Systems     Objective:   Physical Exam On exam, the incision is healing better. I removed the sutures and mostly stayed closed    Assessment:     Patient stable postop     Plan:     She may resume normal activity. Should her drainage Increase, she will come back and see me

## 2013-04-20 ENCOUNTER — Ambulatory Visit (INDEPENDENT_AMBULATORY_CARE_PROVIDER_SITE_OTHER): Payer: Managed Care, Other (non HMO)

## 2013-04-20 ENCOUNTER — Other Ambulatory Visit: Payer: Self-pay | Admitting: Gynecology

## 2013-04-20 ENCOUNTER — Encounter: Payer: Self-pay | Admitting: Gynecology

## 2013-04-20 ENCOUNTER — Ambulatory Visit (INDEPENDENT_AMBULATORY_CARE_PROVIDER_SITE_OTHER): Payer: Managed Care, Other (non HMO) | Admitting: Gynecology

## 2013-04-20 VITALS — BP 130/88

## 2013-04-20 DIAGNOSIS — N831 Corpus luteum cyst of ovary, unspecified side: Secondary | ICD-10-CM

## 2013-04-20 DIAGNOSIS — E282 Polycystic ovarian syndrome: Secondary | ICD-10-CM

## 2013-04-20 DIAGNOSIS — T8332XA Displacement of intrauterine contraceptive device, initial encounter: Secondary | ICD-10-CM

## 2013-04-20 DIAGNOSIS — IMO0002 Reserved for concepts with insufficient information to code with codable children: Secondary | ICD-10-CM

## 2013-04-20 DIAGNOSIS — N83209 Unspecified ovarian cyst, unspecified side: Secondary | ICD-10-CM

## 2013-04-20 DIAGNOSIS — N83201 Unspecified ovarian cyst, right side: Secondary | ICD-10-CM

## 2013-04-20 NOTE — Patient Instructions (Signed)
Ovarian Cyst The ovaries are small organs that are on each side of the uterus. The ovaries are the organs that produce the female hormones, estrogen and progesterone. An ovarian cyst is a sac filled with fluid that can vary in its size. It is normal for a small cyst to form in women who are in the childbearing age and who have menstrual periods. This type of cyst is called a follicle cyst that becomes an ovulation cyst (corpus luteum cyst) after it produces the women's egg. It later goes away on its own if the woman does not become pregnant. There are other kinds of ovarian cysts that may cause problems and may need to be treated. The most serious problem is a cyst with cancer. It should be noted that menopausal women who have an ovarian cyst are at a higher risk of it being a cancer cyst. They should be evaluated very quickly, thoroughly and followed closely. This is especially true in menopausal women because of the high rate of ovarian cancer in women in menopause. CAUSES AND TYPES OF OVARIAN CYSTS:  FUNCTIONAL CYST: The follicle/corpus luteum cyst is a functional cyst that occurs every month during ovulation with the menstrual cycle. They go away with the next menstrual cycle if the woman does not get pregnant. Usually, there are no symptoms with a functional cyst.  ENDOMETRIOMA CYST: This cyst develops from the lining of the uterus tissue. This cyst gets in or on the ovary. It grows every month from the bleeding during the menstrual period. It is also called a "chocolate cyst" because it becomes filled with blood that turns brown. This cyst can cause pain in the lower abdomen during intercourse and with your menstrual period.  CYSTADENOMA CYST: This cyst develops from the cells on the outside of the ovary. They usually are not cancerous. They can get very big and cause lower abdomen pain and pain with intercourse. This type of cyst can twist on itself, cut off its blood supply and cause severe pain. It  also can easily rupture and cause a lot of pain.  DERMOID CYST: This type of cyst is sometimes found in both ovaries. They are found to have different kinds of body tissue in the cyst. The tissue includes skin, teeth, hair, and/or cartilage. They usually do not have symptoms unless they get very big. Dermoid cysts are rarely cancerous.  POLYCYSTIC OVARY: This is a rare condition with hormone problems that produces many small cysts on both ovaries. The cysts are follicle-like cysts that never produce an egg and become a corpus luteum. It can cause an increase in body weight, infertility, acne, increase in body and facial hair and lack of menstrual periods or rare menstrual periods. Many women with this problem develop type 2 diabetes. The exact cause of this problem is unknown. A polycystic ovary is rarely cancerous.  THECA LUTEIN CYST: Occurs when too much hormone (human chorionic gonadotropin) is produced and over-stimulates the ovaries to produce an egg. They are frequently seen when doctors stimulate the ovaries for invitro-fertilization (test tube babies).  LUTEOMA CYST: This cyst is seen during pregnancy. Rarely it can cause an obstruction to the birth canal during labor and delivery. They usually go away after delivery. SYMPTOMS   Pelvic pain or pressure.  Pain during sexual intercourse.  Increasing girth (swelling) of the abdomen.  Abnormal menstrual periods.  Increasing pain with menstrual periods.  You stop having menstrual periods and you are not pregnant. DIAGNOSIS  The diagnosis can   be made during:  Routine or annual pelvic examination (common).  Ultrasound.  X-ray of the pelvis.  CT Scan.  MRI.  Blood tests. TREATMENT   Treatment may only be to follow the cyst monthly for 2 to 3 months with your caregiver. Many go away on their own, especially functional cysts.  May be aspirated (drained) with a long needle with ultrasound, or by laparoscopy (inserting a tube into  the pelvis through a small incision).  The whole cyst can be removed by laparoscopy.  Sometimes the cyst may need to be removed through an incision in the lower abdomen.  Hormone treatment is sometimes used to help dissolve certain cysts.  Birth control pills are sometimes used to help dissolve certain cysts. HOME CARE INSTRUCTIONS  Follow your caregiver's advice regarding:  Medicine.  Follow up visits to evaluate and treat the cyst.  You may need to come back or make an appointment with another caregiver, to find the exact cause of your cyst, if your caregiver is not a gynecologist.  Get your yearly and recommended pelvic examinations and Pap tests.  Let your caregiver know if you have had an ovarian cyst in the past. SEEK MEDICAL CARE IF:   Your periods are late, irregular, they stop, or are painful.  Your stomach (abdomen) or pelvic pain does not go away.  Your stomach becomes larger or swollen.  You have pressure on your bladder or trouble emptying your bladder completely.  You have painful sexual intercourse.  You have feelings of fullness, pressure, or discomfort in your stomach.  You lose weight for no apparent reason.  You feel generally ill.  You become constipated.  You lose your appetite.  You develop acne.  You have an increase in body and facial hair.  You are gaining weight, without changing your exercise and eating habits.  You think you are pregnant. SEEK IMMEDIATE MEDICAL CARE IF:   You have increasing abdominal pain.  You feel sick to your stomach (nausea) and/or vomit.  You develop a fever that comes on suddenly.  You develop abdominal pain during a bowel movement.  Your menstrual periods become heavier than usual. Document Released: 05/14/2005 Document Revised: 08/06/2011 Document Reviewed: 03/17/2009 [facility] Patient Information 2014 [place], [place]. Polycystic Ovarian Syndrome Polycystic ovarian syndrome is a condition with a  number of problems. One problem is with the ovaries. The ovaries are organs located in the female pelvis, on each side of the uterus. Usually, during the menstrual cycle, an egg is released from 1 ovary every month. This is called ovulation. When the egg is fertilized, it goes into the womb (uterus), which allows for the growth of a baby. The egg travels from the ovary through the fallopian tube to the uterus. The ovaries also make the hormones estrogen and progesterone. These hormones help the development of a woman's breasts, body shape, and body hair. They also regulate the menstrual cycle and pregnancy. Sometimes, cysts form in the ovaries. A cyst is a fluid-filled sac. On the ovary, different types of cysts can form. The most common type of ovarian cyst is called a functional or ovulation cyst. It is normal, and often forms during the normal menstrual cycle. Each month, a woman's ovaries grow tiny cysts that hold the eggs. When an egg is fully grown, the sac breaks open. This releases the egg. Then, the sac which released the egg from the ovary dissolves. In one type of functional cyst, called a follicle cyst, the sac does not break  open to release the egg. It may actually continue to grow. This type of cyst usually disappears within 1 to 3 months.  One type of cyst problem with the ovaries is called Polycystic Ovarian Syndrome (PCOS). In this condition, many follicle cysts form, but do not rupture and produce an egg. This health problem can affect the following:  Menstrual cycle.  Heart.  Obesity.  Cancer of the uterus.  Fertility.  Blood vessels.  Hair growth (face and body) or baldness.  Hormones.  Appearance.  High blood pressure.  Stroke.  Insulin production.  Inflammation of the liver.  Elevated blood cholesterol and triglycerides. CAUSES   No one knows the exact cause of PCOS.  Women with PCOS often have a mother or sister with PCOS. There is not yet enough proof to say  this is inherited.  Many women with PCOS have a weight problem.  Researchers are looking at the relationship between PCOS and the body's ability to make insulin. Insulin is a hormone that regulates the change of sugar, starches, and other food into energy for the body's use, or for storage. Some women with PCOS make too much insulin. It is possible that the ovaries react by making too many female hormones, called androgens. This can lead to acne, excessive hair growth, weight gain, and ovulation problems.  Too much production of luteinizing hormone (LH) from the pituitary gland in the brain stimulates the ovary to produce too much female hormone (androgen). SYMPTOMS   Infrequent or no menstrual periods, and/or irregular bleeding.  Inability to get pregnant (infertility), because of not ovulating.  Increased growth of hair on the face, chest, stomach, back, thumbs, thighs, or toes.  Acne, oily skin, or dandruff.  Pelvic pain.  Weight gain or obesity, usually carrying extra weight around the waist.  Type 2 diabetes (this is the diabetes that usually does not need insulin).  High cholesterol.  High blood pressure.  Female-pattern baldness or thinning hair.  Patches of thickened and dark brown or black skin on the neck, arms, breasts, or thighs.  Skin tags, or tiny excess flaps of skin, in the armpits or neck area.  Sleep apnea (excessive snoring and breathing stops at times while asleep).  Deepening of the voice.  Gestational diabetes when pregnant.  Increased risk of miscarriage with pregnancy. DIAGNOSIS  There is no single test to diagnose PCOS.   Your caregiver will:  Take a medical history.  Perform a pelvic exam.  Perform an ultrasound.  Check your female and female hormone levels.  Measure glucose or sugar levels in the blood.  Do other blood tests.  If you are producing too many female hormones, your caregiver will make sure it is from PCOS. At the physical exam,  your caregiver will want to evaluate the areas of increased hair growth. Try to allow natural hair growth for a few days before the visit.  During a pelvic exam, the ovaries may be enlarged or swollen by the increased number of small cysts. This can be seen more easily by vaginal ultrasound or screening, to examine the ovaries and lining of the uterus (endometrium) for cysts. The uterine lining may become thicker, if there has not been a regular period. TREATMENT  Because there is no cure for PCOS, it needs to be managed to prevent problems. Treatments are based on your symptoms. Treatment is also based on whether you want to have a baby or whether you need contraception.  Treatment may include:  Progesterone hormone, to  start a menstrual period.  Birth control pills, to make you have regular menstrual periods.  Medicines to make you ovulate, if you want to get pregnant.  Medicines to control your insulin.  Medicine to control your blood pressure.  Medicine and diet, to control your high cholesterol and triglycerides in your blood.  Surgery, making small holes in the ovary, to decrease the amount of female hormone production. This is done through a long, lighted tube (laparoscope), placed into the pelvis through a tiny incision in the lower abdomen. Your caregiver will go over some of the choices with you. WOMEN WITH PCOS HAVE THESE CHARACTERISTICS:  High levels of female hormones called androgens.  An irregular or no menstrual cycle.  May have many small cysts in their ovaries. PCOS is the most common hormonal reproductive problem in women of childbearing age. WHY DO WOMEN WITH PCOS HAVE TROUBLE WITH THEIR MENSTRUAL CYCLE? Each month, about 20 eggs start to mature in the ovaries. As one egg grows and matures, the follicle breaks open to release the egg, so it can travel through the fallopian tube for fertilization. When the single egg leaves the follicle, ovulation takes place. In women  with PCOS, the ovary does not make all of the hormones it needs for any of the eggs to fully mature. They may start to grow and accumulate fluid, but no one egg becomes large enough. Instead, some may remain as cysts. Since no egg matures or is released, ovulation does not occur and the hormone progesterone is not made. Without progesterone, a woman's menstrual cycle is irregular or absent. Also, the cysts produce female hormones, which continue to prevent ovulation.  Document Released: 09/07/2004 Document Revised: 08/06/2011 Document Reviewed: 10/30/2012 [facility] Patient Information 2014 [place], [place].

## 2013-04-20 NOTE — Progress Notes (Signed)
Patient presented to the office today for one month follow up and having placed a Mirena IUD in October 2014. At that same office visit patient had her Nexplanon removed. Patient was doing well had some cramping but no problems with intercourse or unusual bleeding. 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 Patient scheduled for colposcopy.  Exam: Bartholin's urethra Skene's within normal limits Vagina: No lesions or discharge Cervix: IUD string seen. Endocervical speculum was used IUD string not visualized.  Ultrasound today for confirmation and position of IUD as follows:  Uterus measured 8.7 x 7.8 x 5.7 cm with endometrial stripe of 6 mm. IUD was seen in the proper position. Right ovary thickwalled cyst with reticular echo pattern measured 4.3 x 3.9 x 2.6 cm average size 3.6 cm with positive color flow and the perimeter. Left ovary normal. Increase in volume on both ovaries with several follicles consistent with polycystic ovarian syndrome.  Assessment/plan: #1 patient to follow up next month for colposcopy due to atypical squamous cells of undetermined significance with high-risk HPV 18/45  #2 incidental finding of a right ovarian cyst probable functional hemorrhagic cyst will follow up with ultrasound in March.

## 2013-05-08 ENCOUNTER — Ambulatory Visit (INDEPENDENT_AMBULATORY_CARE_PROVIDER_SITE_OTHER): Payer: Managed Care, Other (non HMO) | Admitting: Gynecology

## 2013-05-08 ENCOUNTER — Encounter: Payer: Self-pay | Admitting: Gynecology

## 2013-05-08 VITALS — BP 130/86

## 2013-05-08 DIAGNOSIS — R6889 Other general symptoms and signs: Secondary | ICD-10-CM

## 2013-05-08 DIAGNOSIS — B977 Papillomavirus as the cause of diseases classified elsewhere: Secondary | ICD-10-CM | POA: Insufficient documentation

## 2013-05-08 DIAGNOSIS — IMO0002 Reserved for concepts with insufficient information to code with codable children: Secondary | ICD-10-CM

## 2013-05-08 NOTE — Patient Instructions (Signed)
Colposcopy Colposcopy is a procedure to examine your cervix and vagina, or the area around the outside of your vagina, for abnormalities or signs of disease. The procedure is done using a lighted microscope called a colposcope. Tissue samples may be collected during the colposcopy if your health care provider finds any unusual cells. A colposcopy may be done if a woman has:  An abnormal Pap test. A Pap test is a medical test done to evaluate cells that are on the surface of the cervix.  A Pap test result that is suggestive of human papillomavirus (HPV). This virus can cause genital warts and is linked to the development of cervical cancer.  A sore on her cervix and the results of a Pap test were normal.  Genital warts on the cervix or in or around the outside of the vagina.  A mother who took the drug diethylstilbestrol (DES) while pregnant. Painful intercourse.   HPV Test The HPV (human papillomavirus) test is used to screen for high-risk types with HPV infection. HPV is a group of about 100 related viruses, of which 40 types are genital viruses. Most HPV viruses cause infections that usually resolve without treatment within 2 years. Some HPV infections can cause skin and genital warts (condylomata). HPV types 16, 18, 31 and 45 are considered high-risk types of HPV. High-risk types of HPV do not usually cause visible warts, but if untreated, may lead to cancers of the outlet of the womb (cervix) or anus. An HPV test identifies the DNA (genetic) strands of the HPV infection. Because the test identifies the DNA strands, the test is also referred to as the HPV DNA test. Although HPV is found in both males and females, the HPV test is only used to screen for cervical cancer in females. This test is recommended for females:  With an abnormal Pap test.  After treatment of an abnormal Pap test.  Aged 58 and older.  After treatment of a high-risk HPV infection. The HPV test may be done at the same  time as a Pap test in females over the age of 33. Both the HPV and Pap test require a sample of cells from the cervix. PREPARATION FOR TEST  You may be asked to avoid douching, tampons, or vaginal medicines for 48 hours before the HPV test. You will be asked to urinate before the test. For the HPV test, you will need to lie on an exam table with your feet in stirrups. A spatula will be inserted into the vagina. The spatula will be used to swab the cervix for a cell and mucus sample. The sample will be further evaluated in a lab under a microscope. NORMAL FINDINGS  Normal: High-risk HPV is not found.  Ranges for normal findings may vary among different laboratories and hospitals. You should always check with your doctor after having lab work or other tests done to discuss the meaning of your test results and whether your values are considered within normal limits. MEANING OF TEST An abnormal HPV test means that high-risk HPV is found. Your caregiver may recommend further testing. Your caregiver will go over the test results with you. He or she will and discuss the importance and meaning of your results, as well as treatment options and the need for additional tests, if necessary. OBTAINING THE RESULTS  It is your responsibility to obtain your test results. Ask the lab or department performing the test when and how you will get your results. Document Released: 06/08/2004 Document  Revised: 08/06/2011 Document Reviewed: 02/21/2005 [facility] Patient Information 2014 [place], [place].    Vaginal bleeding, especially after sexual intercourse. LET [facility] CARE PROVIDER KNOW ABOUT:  Any allergies you have.  All medicines you are taking, including vitamins, herbs, eye drops, creams, and over-the-counter medicines.  Previous problems you or members of your family have had with the use of anesthetics.  Any blood disorders you have.  Previous surgeries you have had.  Medical conditions you  have. RISKS AND COMPLICATIONS Generally, a colposcopy is a safe procedure. However, as with any procedure, complications can occur. Possible complications include:  Bleeding.  Infection.  Missed lesions. BEFORE THE PROCEDURE   Tell your health care provider if you have your menstrual period. A colposcopy typically is not done during menstruation.  For 24 hours before the colposcopy, do not:  Douche.  Use tampons.  Use medicines, creams, or suppositories in the vagina.  Have sexual intercourse. PROCEDURE  During the procedure, you will be lying on your back with your feet in foot rests (stirrups). A warm metal or plastic instrument (speculum) will be placed in your vagina to keep it open and to allow the health care provider to see the cervix. The colposcope will be placed outside the vagina. It will be used to magnify and examine the cervix, vagina, and the area around the outside of the vagina. A small amount of liquid solution will be placed on the area that is to be viewed. This solution will make it easier to see the abnormal cells. Your health care provider will use tools to suck out mucus and cells from the canal of the cervix. Then he or she will record the location of the abnormal areas. If a biopsy is done during the procedure, a medicine will usually be given to numb the area (local anesthetic). You may feel mild pain or cramping while the biopsy is done. After the procedure, tissue samples collected during the biopsy will be sent to a lab for analysis. AFTER THE PROCEDURE  You will be given instructions on when to follow up with your health care provider for your test results. It is important to keep your appointment. Document Released: 08/04/2002 Document Revised: 01/14/2013 Document Reviewed: 12/11/2012 [facility] Patient Information 2014 [place], [place].

## 2013-05-08 NOTE — Progress Notes (Signed)
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 On last office visit November 24 she had an ultrasound to confirm position and the IUD at which time it was noted that she had a thick right ovarian cyst with a reticular echo pattern which measured 4.3 x 3.9 x 2.6 cm average size 3.6 cm positive color flow the perimeter. Left ovary normal increase in volume in both ovaries with several follicles consistent with polycystic ovarian syndrome.  Colposcopic evaluation: Physical Exam  Genitourinary:     Cervical biopsy was obtained from the 12:00 position along with an ECC and submitted for histological evaluation. The rest of her external genitalia, perineum, perirectal, and vaginal fornices with no evidence of any lesions. Monsel solution was used for hemostasis.  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 Cervical biopsy from the 12:00 position as well as ECC obtained and was submitted for histological evaluation result pending at time of this dictation. Patient scheduled to return back in February for followup on her ovarian cyst. Literature and information was provided as well as detail explanation on abnormal Pap smear as well as on the HPV. Of note patient did complete HPV vaccine series in the past.

## 2013-05-22 ENCOUNTER — Ambulatory Visit: Payer: Managed Care, Other (non HMO) | Admitting: Internal Medicine

## 2013-05-25 ENCOUNTER — Encounter: Payer: Self-pay | Admitting: Internal Medicine

## 2013-05-25 ENCOUNTER — Ambulatory Visit (INDEPENDENT_AMBULATORY_CARE_PROVIDER_SITE_OTHER): Payer: Managed Care, Other (non HMO) | Admitting: Internal Medicine

## 2013-05-25 VITALS — BP 115/60 | HR 80 | Temp 98.0°F | Resp 16 | Wt 243.0 lb

## 2013-05-25 DIAGNOSIS — F17200 Nicotine dependence, unspecified, uncomplicated: Secondary | ICD-10-CM

## 2013-05-25 DIAGNOSIS — H02729 Madarosis of unspecified eye, unspecified eyelid and periocular area: Secondary | ICD-10-CM

## 2013-05-25 DIAGNOSIS — Z23 Encounter for immunization: Secondary | ICD-10-CM

## 2013-05-25 DIAGNOSIS — Z720 Tobacco use: Secondary | ICD-10-CM | POA: Insufficient documentation

## 2013-05-25 MED ORDER — BIMATOPROST 0.03 % EX SOLN: 1.0000 [drp] | Freq: Every day | CUTANEOUS | Status: DC

## 2013-05-25 MED ORDER — VARENICLINE TARTRATE 0.5 MG X 11 & 1 MG X 42 PO MISC: ORAL | Status: DC

## 2013-05-25 MED ORDER — VARENICLINE TARTRATE 1 MG PO TABS: 1.0000 mg | ORAL_TABLET | Freq: Two times a day (BID) | ORAL | Status: DC

## 2013-05-25 NOTE — Patient Instructions (Signed)
Smoking Cessation Quitting smoking is important to your health and has many advantages. However, it is not always easy to quit since nicotine is a very addictive drug. Often times, people try 3 times or more before being able to quit. This document explains the best ways for you to prepare to quit smoking. Quitting takes hard work and a lot of effort, but you can do it. ADVANTAGES OF QUITTING SMOKING  You will live longer, feel better, and live better.  Your body will feel the impact of quitting smoking almost immediately.  Within 20 minutes, blood pressure decreases. Your pulse returns to its normal level.  After 8 hours, carbon monoxide levels in the blood return to normal. Your oxygen level increases.  After 24 hours, the chance of having a heart attack starts to decrease. Your breath, hair, and body stop smelling like smoke.  After 48 hours, damaged nerve endings begin to recover. Your sense of taste and smell improve.  After 72 hours, the body is virtually free of nicotine. Your bronchial tubes relax and breathing becomes easier.  After 2 to 12 weeks, lungs can hold more air. Exercise becomes easier and circulation improves.  The risk of having a heart attack, stroke, cancer, or lung disease is greatly reduced.  After 1 year, the risk of coronary heart disease is cut in half.  After 5 years, the risk of stroke falls to the same as a nonsmoker.  After 10 years, the risk of lung cancer is cut in half and the risk of other cancers decreases significantly.  After 15 years, the risk of coronary heart disease drops, usually to the level of a nonsmoker.  If you are pregnant, quitting smoking will improve your chances of having a healthy baby.  The people you live with, especially any children, will be healthier.  You will have extra money to spend on things other than cigarettes. QUESTIONS TO THINK ABOUT BEFORE ATTEMPTING TO QUIT You may want to talk about your answers with your  caregiver.  Why do you want to quit?  If you tried to quit in the past, what helped and what did not?  What will be the most difficult situations for you after you quit? How will you plan to handle them?  Who can help you through the tough times? Your family? Friends? A caregiver?  What pleasures do you get from smoking? What ways can you still get pleasure if you quit? Here are some questions to ask your caregiver:  How can you help me to be successful at quitting?  What medicine do you think would be best for me and how should I take it?  What should I do if I need more help?  What is smoking withdrawal like? How can I get information on withdrawal? GET READY  Set a quit date.  Change your environment by getting rid of all cigarettes, ashtrays, matches, and lighters in your home, car, or work. Do not let people smoke in your home.  Review your past attempts to quit. Think about what worked and what did not. GET SUPPORT AND ENCOURAGEMENT You have a better chance of being successful if you have help. You can get support in many ways.  Tell your family, friends, and co-workers that you are going to quit and need their support. Ask them not to smoke around you.  Get individual, group, or telephone counseling and support. Programs are available at local hospitals and health centers. Call your local health department for   information about programs in your area.  Spiritual beliefs and practices may help some smokers quit.  Download a "quit meter" on your computer to keep track of quit statistics, such as how long you have gone without smoking, cigarettes not smoked, and money saved.  Get a self-help book about quitting smoking and staying off of tobacco. LEARN NEW SKILLS AND BEHAVIORS  Distract yourself from urges to smoke. Talk to someone, go for a walk, or occupy your time with a task.  Change your normal routine. Take a different route to work. Drink tea instead of coffee.  Eat breakfast in a different place.  Reduce your stress. Take a hot bath, exercise, or read a book.  Plan something enjoyable to do every day. Reward yourself for not smoking.  Explore interactive web-based programs that specialize in helping you quit. GET MEDICINE AND USE IT CORRECTLY Medicines can help you stop smoking and decrease the urge to smoke. Combining medicine with the above behavioral methods and support can greatly increase your chances of successfully quitting smoking.  Nicotine replacement therapy helps deliver nicotine to your body without the negative effects and risks of smoking. Nicotine replacement therapy includes nicotine gum, lozenges, inhalers, nasal sprays, and skin patches. Some may be available over-the-counter and others require a prescription.  Antidepressant medicine helps people abstain from smoking, but how this works is unknown. This medicine is available by prescription.  Nicotinic receptor partial agonist medicine simulates the effect of nicotine in your brain. This medicine is available by prescription. Ask your caregiver for advice about which medicines to use and how to use them based on your health history. Your caregiver will tell you what side effects to look out for if you choose to be on a medicine or therapy. Carefully read the information on the package. Do not use any other product containing nicotine while using a nicotine replacement product.  RELAPSE OR DIFFICULT SITUATIONS Most relapses occur within the first 3 months after quitting. Do not be discouraged if you start smoking again. Remember, most people try several times before finally quitting. You may have symptoms of withdrawal because your body is used to nicotine. You may crave cigarettes, be irritable, feel very hungry, cough often, get headaches, or have difficulty concentrating. The withdrawal symptoms are only temporary. They are strongest when you first quit, but they will go away within  10 14 days. To reduce the chances of relapse, try to:  Avoid drinking alcohol. Drinking lowers your chances of successfully quitting.  Reduce the amount of caffeine you consume. Once you quit smoking, the amount of caffeine in your body increases and can give you symptoms, such as a rapid heartbeat, sweating, and anxiety.  Avoid smokers because they can make you want to smoke.  Do not let weight gain distract you. Many smokers will gain weight when they quit, usually less than 10 pounds. Eat a healthy diet and stay active. You can always lose the weight gained after you quit.  Find ways to improve your mood other than smoking. FOR MORE INFORMATION  www.smokefree.gov  Document Released: 05/08/2001 Document Revised: 11/13/2011 Document Reviewed: 08/23/2011 [employer] Patient Information 2014 [employer].  

## 2013-05-25 NOTE — Progress Notes (Signed)
   Subjective:    Patient ID: Traci Peterson, female    DOB: [birth year], 27 y.o.   MRN: [id number]  HPI  She comes in today to discuss smoking cessation options, she is very motivated.  Review of Systems  Constitutional: Negative.  Negative for fever, chills, diaphoresis, appetite change and fatigue.  HENT: Negative.   Eyes: Negative.  Negative for photophobia, pain, discharge, redness, itching and visual disturbance.       She has lost her upper eyelashes on both sides over the years and she wants to treat this.  Respiratory: Negative.  Negative for cough, choking, chest tightness, shortness of breath, wheezing and stridor.   Cardiovascular: Negative.  Negative for chest pain, palpitations and leg swelling.  Gastrointestinal: Negative.  Negative for nausea, vomiting, abdominal pain, diarrhea and constipation.  Endocrine: Negative.   Genitourinary: Negative.   Musculoskeletal: Negative.   Skin: Negative.   Allergic/Immunologic: Negative.   Neurological: Negative.   Hematological: Negative.  Negative for adenopathy. Does not bruise/bleed easily.  Psychiatric/Behavioral: Negative.   All other systems reviewed and are negative.       Objective:   Physical Exam  Vitals reviewed. Constitutional: She is oriented to person, place, and time. She appears well-developed and well-nourished. No distress.  HENT:  Head: Normocephalic and atraumatic.  Mouth/Throat: Oropharynx is clear and moist. No oropharyngeal exudate.  Eyes: Conjunctivae are normal. Right eye exhibits no discharge. Left eye exhibits no discharge. Right conjunctiva is not injected. Right conjunctiva has no hemorrhage. Left conjunctiva is not injected. Left conjunctiva has no hemorrhage. No scleral icterus.    Neck: Normal range of motion. Neck supple. No JVD present. No tracheal deviation present. No thyromegaly present.  Cardiovascular: Normal rate, regular rhythm, normal heart sounds and intact distal pulses.  Exam  reveals no gallop and no friction rub.   No murmur heard. Pulmonary/Chest: Effort normal and breath sounds normal. No stridor. No respiratory distress. She has no wheezes. She has no rales. She exhibits no tenderness.  Abdominal: Soft. Bowel sounds are normal. She exhibits no distension and no mass. There is no tenderness. There is no rebound and no guarding.  Musculoskeletal: Normal range of motion. She exhibits no edema and no tenderness.  Lymphadenopathy:    She has no cervical adenopathy.  Neurological: She is oriented to person, place, and time.  Skin: Skin is warm and dry. No rash noted. She is not diaphoretic. No erythema. No pallor.  Psychiatric: She has a normal mood and affect. Her behavior is normal. Judgment and thought content normal.     Lab Results  Component Value Date   WBC 5.6 08/08/2012   HGB 13.8 03/12/2013   HCT 41.2 08/08/2012   PLT 238.0 08/08/2012   GLUCOSE 90 08/08/2012   CHOL 164 12/31/2011   TRIG 129.0 12/31/2011   HDL 40.60 12/31/2011   LDLCALC 98 12/31/2011   ALT 30 08/08/2012   AST 34 08/08/2012   NA 138 08/08/2012   K 4.1 08/08/2012   CL 107 08/08/2012   CREATININE 0.8 08/08/2012   BUN 6 08/08/2012   CO2 24 08/08/2012       Assessment & Plan:

## 2013-05-25 NOTE — Progress Notes (Signed)
Pre visit review using our clinic review tool, if applicable. No additional management support is needed unless otherwise documented below in the visit note. 

## 2013-05-26 ENCOUNTER — Encounter: Payer: Self-pay | Admitting: Internal Medicine

## 2013-05-27 NOTE — Assessment & Plan Note (Signed)
I d/w her the treatment options - wellbutrin, chantix, nicotine replacements, acupuncture and she has decided to try chantix

## 2013-05-27 NOTE — Assessment & Plan Note (Signed)
She will start treating this with latisse

## 2013-06-15 ENCOUNTER — Encounter: Payer: Self-pay | Admitting: Gynecology

## 2013-06-22 ENCOUNTER — Telehealth: Payer: Self-pay | Admitting: Internal Medicine

## 2013-06-22 NOTE — Telephone Encounter (Signed)
Relevant patient education assigned to patient using Emmi. ° °

## 2013-06-29 ENCOUNTER — Ambulatory Visit (INDEPENDENT_AMBULATORY_CARE_PROVIDER_SITE_OTHER): Payer: BC Managed Care – PPO

## 2013-06-29 ENCOUNTER — Ambulatory Visit (INDEPENDENT_AMBULATORY_CARE_PROVIDER_SITE_OTHER): Payer: BC Managed Care – PPO | Admitting: Gynecology

## 2013-06-29 ENCOUNTER — Other Ambulatory Visit: Payer: Self-pay | Admitting: Gynecology

## 2013-06-29 DIAGNOSIS — T8389XA Other specified complication of genitourinary prosthetic devices, implants and grafts, initial encounter: Secondary | ICD-10-CM

## 2013-06-29 DIAGNOSIS — T8332XA Displacement of intrauterine contraceptive device, initial encounter: Secondary | ICD-10-CM

## 2013-06-29 DIAGNOSIS — N83209 Unspecified ovarian cyst, unspecified side: Secondary | ICD-10-CM

## 2013-06-29 DIAGNOSIS — N83201 Unspecified ovarian cyst, right side: Secondary | ICD-10-CM

## 2013-06-29 DIAGNOSIS — Z30431 Encounter for routine checking of intrauterine contraceptive device: Secondary | ICD-10-CM

## 2013-06-29 NOTE — Progress Notes (Signed)
   Patient presented to the office for an ultrasound. She was seen in the office on December 12 for followup on her abnormal Pap smear see previous note for details. Patient with prior history of a large left ovarian cyst and was here for followup ultrasound. She was asymptomatic today. She's contemplated getting pregnant next year. She does have a Mirena IUD.  Ultrasound today: Uterus measured 8.0 x 5.9 x 5.2 cm with endometrial stripe of 4.2 mm. The IUD was noted to be in the proper place in the cavity right ovary was normal previous ovarian cyst not seen left ovary thinwall echo-free follicle measuring 20 x 32 x 20 mm and was avascular. There was no fluid in the cul-de-sac.  Patient scheduled to return back next year for her annual exam and followup Pap smear. 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

## 2013-07-08 ENCOUNTER — Other Ambulatory Visit: Payer: Managed Care, Other (non HMO)

## 2013-07-08 ENCOUNTER — Ambulatory Visit: Payer: Managed Care, Other (non HMO) | Admitting: Gynecology

## 2013-07-31 ENCOUNTER — Encounter: Payer: Self-pay | Admitting: Internal Medicine

## 2013-07-31 ENCOUNTER — Ambulatory Visit (INDEPENDENT_AMBULATORY_CARE_PROVIDER_SITE_OTHER): Payer: BC Managed Care – PPO | Admitting: Internal Medicine

## 2013-07-31 VITALS — BP 90/60 | HR 84 | Temp 98.3°F | Wt 249.6 lb

## 2013-07-31 DIAGNOSIS — F411 Generalized anxiety disorder: Secondary | ICD-10-CM

## 2013-07-31 DIAGNOSIS — G43909 Migraine, unspecified, not intractable, without status migrainosus: Secondary | ICD-10-CM

## 2013-07-31 MED ORDER — CITALOPRAM HYDROBROMIDE 20 MG PO TABS: 20.0000 mg | ORAL_TABLET | Freq: Every day | ORAL | Status: DC

## 2013-07-31 MED ORDER — SUMATRIPTAN SUCCINATE 100 MG PO TABS: ORAL_TABLET | ORAL | Status: DC

## 2013-07-31 NOTE — Progress Notes (Signed)
   Subjective:    Patient ID: Traci Peterson, female    DOB: [birth year], 28 y.o.   MRN: [id number]  HPI Patient is here for follow-up after migraine HA on 3/4, when she was [facility].  Reports headache began while working 3rd shift and by 7:00 am she was unable to drive and walked across the street for care.  At the ER, she was treated with IV headache cocktail and was prescribed Fioricet.  [facility]. States results were normal. Reports she was written out of work for 1-2 days, although she did go to work on Wednesday night.  Reports she took 1/2 Fioricet twice throughout shift; HA persisted. Last night she had occipital HA, which she feels was worse than the migraine.  Associated symptoms include: light sensitivity, blurred R vision; pain and tingling in face, sweating, nausea. She vomited on 3/4 with migraine.  PMH significant for anxiety, treated in the past; reports current stressors of work and recent postponed marriage and believes this to be the trigger for headaches.  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  FH is significant for mother having benign frontal brain tumor, surgically removed. Patient desires restarting her anxiety medication (Celexa) and feels this will help reduce the migraines.     Review of Systems  She states that she craves chocolate especially after her menses. She cannot quantitate the intake. She will have 10 ounces of cola a day and on average one cup of coffee a day. She's also been taking up to 6 Aleve a day in addition to the Fioricet. Denies fever, chills, nausea, palpitations, chest heaviness, numbness or weakness in extremities.      Objective:   Physical Exam General appearance:Adequately  nourished; but weight excess. In no acute distress or increased work of breathing is present. No lymphadenopathy about the head, neck, or axilla noted.  Patient has hair in tight head wrap.  Eyes:  No conjunctival inflammation or lid edema is present. There is no scleral icterus. EOMI Ears: External ear exam shows no significant lesions or deformities. Otoscopic examination reveals clear canals, tympanic membranes are intact bilaterally without bulging, retraction, inflammation or discharge.  Nose: External nasal examination shows no deformity or inflammation. Nasal mucosa are pink and moist without lesions or exudates. No septal dislocation or deviation.No obstruction to airflow.  Oral exam: Dental hygiene is good; lips and gums are healthy appearing.There is no oropharyngeal erythema or exudate noted.  Neck: No deformities, thyromegaly, masses, or tenderness noted. Supple with full range of motion without pain.  Heart: Normal rate and regular rhythm. S1 and S2 normal without gallop, murmur, click, rub or other extra sounds.  Lungs:Chest clear to auscultation; no wheezes, rhonchi,rales ,or rubs present.No increased work of breathing.  Extremities: No cyanosis, edema, or clubbing noted  Neuro: DTRs equal . Strength and tone normal Skin: Warm & dry w/o suspicious lesions or tenting.        Assessment & Plan:  #1 migraines, worse last night by history. This is in the context of increased caffeine and nonsteroidal intake.  #2 anxiety Plan: tryptan trial Headache diary Pathophysiology of migraines and chronic daily headache discussed. Restart citalopram. She is to decrease the dose by one half daily any day she does take a generic Imitrex

## 2013-07-31 NOTE — Patient Instructions (Addendum)
Please keep a diary of your headaches . Document  each occurrence on the calendar with notation of : #1 any prodrome ( any non headache symptom such as marked fatigue,visual changes, ,etc ) which precedes actual headache ; #2) severity on 1-10 scale; #3) any triggers ( food/ drink,enviromenntal or weather changes ,physical or emotional stress) in 8-12 hour period prior to the headache; & #4) response to any medications or other intervention. Please review "Headache" @ [facility] for additional information.   The serotonin reuptake inhibitor (citalopram) should be decreased by half dose if the tryptan (Imitrex) is taken for migraines . The tryptan  could increase the relative dose of the serotonin reuptake inhibitor as they would both be metabolized through the liver.

## 2013-07-31 NOTE — Progress Notes (Signed)
   Subjective:    Patient ID: Traci Peterson, female    DOB: [birth year], 28 y.o.   MRN: [id number]  HPI Patient is here for follow-up after migraine HA on 3/4, when she was seen at [facility].  Reports headache began while working 3rd shift and by 7:00 am she was unable to drive and walked across the street for care.  At the ER, she was treated with IV headache cocktail and was prescribed Fioricet. Reports she was written out of work for 1-2 days, although she did go to work on Wednesday night.  Reports she took 1/2 Fioricet twice throughout shift; HA persisted. Last night she had occipital HA, which she feels was worse than the migraine.  Prodromal symptoms include: light sensitivity, blurred R vision; pain and tingling in face, sweating, nausea. She vomited on 3/4 with migraine.  PMH significant for anxiety, treated in the past; reports current stressors of work and recent postponed marriage and believes this to be the trigger.  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    FH is significant for mother having benign frontal brain tumor, surgically removed. CT [facility]. States results were normal.   Patient desires restarting her anxiety medication and feels this will help reduce the migraines.    Review of Systems Denies fever, chills, nausea, palpitations, chest heaviness, numbness or weakness in extremities.    Objective:   Physical Exam General appearance:good health ;well nourished; no acute distress or increased work of breathing is present.  No  lymphadenopathy about the head, neck, or axilla noted.  Patient has hair in tight head wrap.  Eyes: No conjunctival inflammation or lid edema is present. There is no scleral icterus. Ears:  External ear exam shows no significant lesions or deformities.  Otoscopic examination reveals clear canals, tympanic membranes are intact bilaterally without bulging, retraction, inflammation or  discharge. Nose:  External nasal examination shows no deformity or inflammation. Nasal mucosa are pink and moist without lesions or exudates. No septal dislocation or deviation.No obstruction to airflow.  Oral exam: Dental hygiene is good; lips and gums are healthy appearing.There is no oropharyngeal erythema or exudate noted.  Neck:  No deformities, thyromegaly, masses, or tenderness noted.   Supple with full range of motion without pain.  Heart:  Normal rate and regular rhythm. S1 and S2 normal without gallop, murmur, click, rub or other extra sounds.  Lungs:Chest clear to auscultation; no wheezes, rhonchi,rales ,or rubs present.No increased work of breathing.   Extremities:  No cyanosis, edema, or clubbing  noted  Neuro: DTRs equal Skin: Warm & dry w/o jaundice or tenting.        Assessment & Plan:

## 2013-07-31 NOTE — Progress Notes (Signed)
Pre visit review using our clinic review tool, if applicable. No additional management support is needed unless otherwise documented below in the visit note. 

## 2013-08-11 ENCOUNTER — Telehealth: Payer: Self-pay | Admitting: Internal Medicine

## 2013-08-11 DIAGNOSIS — R0683 Snoring: Secondary | ICD-10-CM

## 2013-08-11 NOTE — Telephone Encounter (Signed)
Pt request referral to pulmonary due to her snoring is getting worse.

## 2013-08-14 ENCOUNTER — Ambulatory Visit (INDEPENDENT_AMBULATORY_CARE_PROVIDER_SITE_OTHER): Payer: BC Managed Care – PPO | Admitting: Pulmonary Disease

## 2013-08-14 ENCOUNTER — Encounter: Payer: Self-pay | Admitting: Pulmonary Disease

## 2013-08-14 VITALS — BP 110/72 | HR 100 | Temp 98.5°F | Ht 62.0 in | Wt 248.0 lb

## 2013-08-14 DIAGNOSIS — R0609 Other forms of dyspnea: Secondary | ICD-10-CM

## 2013-08-14 DIAGNOSIS — R0989 Other specified symptoms and signs involving the circulatory and respiratory systems: Secondary | ICD-10-CM

## 2013-08-14 DIAGNOSIS — R0683 Snoring: Secondary | ICD-10-CM

## 2013-08-14 NOTE — Progress Notes (Signed)
Subjective:    Patient ID: Traci Peterson, female    DOB: 08/02/1985, 28 y.o.   MRN: [id number]  HPI  28 year old smoker presents for evaluation of obstructive sleep apnea. She reports extremely loud snoring, and sore throat and loss of voice on waking up in the morning. No apneas have been noted by family members. 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 She is engaged to be married soon and is worried about how her sleep issues may impact her marriage.  Epworth sleepiness score is 15 and she reports sleepiness while sitting and reading, watching TV or as a passenger in a car. Bedtime is around 9 AM, the TV stays on daytime, sleep latency about one hour, she sleeps in the prone position with 3-4 pillows, she likes to build a nest with pillows, reports 2-3 awakenings and is out of bed by 9:30 PM on work days feeling tired. She's gained an unquantified amount of weight over the last 5 years. She smokes about half pack per day and denies excessive use of caffeinated beverages. There is no history suggestive of cataplexy, sleep paralysis or parasomnias She has been diagnosed with polycystic ovarian syndrome   Past Medical History  Diagnosis Date  . Anxiety     no current med.  . Pilonidal cyst 02/2013  . Jaw snapping     states jaw pops if opens mouth too wide  . Menstrual bleeding problem     menses from 12/22/2012 - current (03/06/2013)  . Chronic headaches     Past Surgical History  Procedure Laterality Date  . Wisdom tooth extraction    . Pilonidal cyst excision N/A 03/12/2013    Procedure: CYST EXCISION PILONIDAL EXTENSIVE;  Surgeon: Shelly Rubensteinouglas A Blackman, MD;  Location: [facility];  Service: General;  Laterality: N/A;    Allergies  Allergen Reactions  . Orange Syrup Hives and Shortness Of Breath    ORANGE DYE    History   Social History  . Marital Status: Single    Spouse Name: N/A     Number of Children: N/A  . Years of Education: N/A   Occupational History  .  Other    CNA   Social History Main Topics  . Smoking status: Former Smoker -- 0.25 packs/day for 6 years    Types: Cigarettes    Quit date: 06/27/2013  . Smokeless tobacco: Never Used     Comment: 3-5 cig./day  . Alcohol Use: 0.0 oz/week     Comment: socially  . Drug Use: No  . Sexual Activity: Yes    Birth Control/ Protection: Implant   Other Topics Concern  . Not on file   Social History Narrative  . No narrative on file    Family History  Problem Relation Age of Onset  . Hypertension Mother   . Hyperlipidemia Mother   . Diabetes Mother   . Thyroid disease Mother   . Diabetes Father   . Hypertension Father   . Alcohol abuse Father   . Breast cancer Maternal Aunt   . Lung cancer Paternal Grandfather      Review of Systems Constitutional: negative for anorexia, fevers and sweats  Eyes: negative for irritation, redness and visual disturbance  Ears, nose, mouth, throat, and face: negative for earaches, epistaxis, nasal congestion and sore throat  Respiratory: negative for cough, dyspnea on exertion, sputum and wheezing  Cardiovascular: negative for  chest pain, dyspnea, lower extremity edema, orthopnea, palpitations and syncope  Gastrointestinal: negative for abdominal pain, constipation, diarrhea, melena, nausea and vomiting  Genitourinary:negative for dysuria, frequency and hematuria  Hematologic/lymphatic: negative for bleeding, easy bruising and lymphadenopathy  Musculoskeletal:negative for arthralgias, muscle weakness and stiff joints  Neurological: negative for coordination problems, gait problems, headaches and weakness  Endocrine: negative for diabetic symptoms including polydipsia, polyuria and weight loss     Objective:   Physical Exam Gen. Pleasant, obese, in no distress, normal affect ENT - no lesions, no post nasal drip, class 2 airway Neck: No JVD, no thyromegaly, no  carotid bruits, acanthosis Lungs: no use of accessory muscles, no dullness to percussion, decreased without rales or rhonchi  Cardiovascular: Rhythm regular, heart sounds  normal, no murmurs or gallops, no peripheral edema Abdomen: soft and non-tender, no hepatosplenomegaly, BS normal. Musculoskeletal: No deformities, no cyanosis or clubbing Neuro:  alert, non focal, no tremors       Assessment & Plan:

## 2013-08-14 NOTE — Patient Instructions (Signed)
Schedule sleep study

## 2013-08-14 NOTE — Assessment & Plan Note (Signed)
Given excessive daytime somnolence, narrow pharyngeal exam, witnessed apneas & loud snoring, obstructive sleep apnea is very likely & an overnight polysomnogram will be scheduled as a home study. The pathophysiology of obstructive sleep apnea , it's cardiovascular consequences & modes of treatment including CPAP were discused with the patient in detail & they evidenced understanding.  

## 2013-09-09 ENCOUNTER — Telehealth: Payer: Self-pay | Admitting: Pulmonary Disease

## 2013-09-09 NOTE — Telephone Encounter (Signed)
Order was placed for home sleep study 08/14/13. Please advise PCC;s and we can inform pt. thanks

## 2013-09-11 NOTE — Telephone Encounter (Signed)
SPOKE TO PT SHE IS AWARE SHE WILL BE CALLED AS SOON AS AN ALICE IS AVAILABLE FOR HER TO USE Tobe SosSally E Ottinger

## 2013-09-28 ENCOUNTER — Telehealth: Payer: Self-pay | Admitting: Pulmonary Disease

## 2013-09-28 NOTE — Telephone Encounter (Signed)
Pt returned my call and r/s HST to 10/02/13. Nothing else needed at this time. Rhonda J Cobb

## 2013-09-28 NOTE — Telephone Encounter (Signed)
Please advise PCC;s thanks 

## 2013-09-28 NOTE — Telephone Encounter (Signed)
LMOAM for pt to return my call to [facility]. Rhonda J Cobb

## 2013-10-02 DIAGNOSIS — R0609 Other forms of dyspnea: Secondary | ICD-10-CM

## 2013-10-02 DIAGNOSIS — R0989 Other specified symptoms and signs involving the circulatory and respiratory systems: Secondary | ICD-10-CM

## 2013-10-07 ENCOUNTER — Telehealth: Payer: Self-pay | Admitting: Pulmonary Disease

## 2013-10-07 NOTE — Telephone Encounter (Signed)
HST did not show sig OSA Since she is very symptomatic, would suggest PSG at sleep center, more extensive test - OK to order if pt willing

## 2013-10-08 DIAGNOSIS — R0989 Other specified symptoms and signs involving the circulatory and respiratory systems: Secondary | ICD-10-CM

## 2013-10-08 DIAGNOSIS — R0609 Other forms of dyspnea: Secondary | ICD-10-CM

## 2013-10-08 NOTE — Telephone Encounter (Signed)
lmomtcb x1 for pt 

## 2013-10-09 ENCOUNTER — Encounter: Payer: Self-pay | Admitting: Pulmonary Disease

## 2013-10-13 NOTE — Telephone Encounter (Signed)
lmomtcb x 2  

## 2013-10-15 NOTE — Telephone Encounter (Signed)
lmtcb x3 

## 2013-10-20 ENCOUNTER — Telehealth: Payer: Self-pay | Admitting: Pulmonary Disease

## 2013-10-20 NOTE — Telephone Encounter (Signed)
LMOM x 1  Oretha Milch, MD at 10/07/2013 5:11 PM     HST did not show sig OSA  Since she is very symptomatic, would suggest PSG at sleep center, more extensive test - OK to order if pt willing

## 2013-10-21 NOTE — Telephone Encounter (Signed)
LMTCB

## 2013-10-22 NOTE — Telephone Encounter (Signed)
lmomtcb x1 

## 2013-10-23 NOTE — Telephone Encounter (Signed)
LMOMx 3  Letter mailed to patient to contact office.

## 2013-10-26 ENCOUNTER — Telehealth: Payer: Self-pay | Admitting: Pulmonary Disease

## 2013-10-26 DIAGNOSIS — R0683 Snoring: Secondary | ICD-10-CM

## 2013-10-26 NOTE — Telephone Encounter (Signed)
Pt advised and is ok to have PSG. Order placed. Carron Curie, CMA

## 2013-10-26 NOTE — Telephone Encounter (Signed)
Oretha Milch, MD at 10/07/2013 5:11 PM      HST did not show sig OSA  Since she is very symptomatic, would suggest PSG at sleep center, more extensive test - OK to order if pt willing     lmtcb x1

## 2013-11-10 ENCOUNTER — Encounter (HOSPITAL_BASED_OUTPATIENT_CLINIC_OR_DEPARTMENT_OTHER): Payer: BC Managed Care – PPO

## 2013-11-16 ENCOUNTER — Encounter (HOSPITAL_COMMUNITY): Payer: Self-pay | Admitting: Emergency Medicine

## 2013-11-16 ENCOUNTER — Emergency Department (HOSPITAL_COMMUNITY)
Admission: EM | Admit: 2013-11-16 | Discharge: 2013-11-17 | Disposition: A | Discharge status: Home / Self Care | Payer: BC Managed Care – PPO | Attending: Emergency Medicine | Admitting: Emergency Medicine

## 2013-11-16 DIAGNOSIS — R110 Nausea: Secondary | ICD-10-CM | POA: Insufficient documentation

## 2013-11-16 DIAGNOSIS — Z79899 Other long term (current) drug therapy: Secondary | ICD-10-CM | POA: Insufficient documentation

## 2013-11-16 DIAGNOSIS — R05 Cough: Secondary | ICD-10-CM

## 2013-11-16 DIAGNOSIS — F411 Generalized anxiety disorder: Secondary | ICD-10-CM | POA: Insufficient documentation

## 2013-11-16 DIAGNOSIS — Z872 Personal history of diseases of the skin and subcutaneous tissue: Secondary | ICD-10-CM | POA: Insufficient documentation

## 2013-11-16 DIAGNOSIS — E669 Obesity, unspecified: Secondary | ICD-10-CM | POA: Insufficient documentation

## 2013-11-16 DIAGNOSIS — Z8719 Personal history of other diseases of the digestive system: Secondary | ICD-10-CM | POA: Insufficient documentation

## 2013-11-16 DIAGNOSIS — J039 Acute tonsillitis, unspecified: Secondary | ICD-10-CM

## 2013-11-16 DIAGNOSIS — Z8742 Personal history of other diseases of the female genital tract: Secondary | ICD-10-CM | POA: Insufficient documentation

## 2013-11-16 DIAGNOSIS — R059 Cough, unspecified: Secondary | ICD-10-CM

## 2013-11-16 DIAGNOSIS — Z87891 Personal history of nicotine dependence: Secondary | ICD-10-CM | POA: Insufficient documentation

## 2013-11-16 DIAGNOSIS — G8929 Other chronic pain: Secondary | ICD-10-CM | POA: Insufficient documentation

## 2013-11-16 DIAGNOSIS — Z7982 Long term (current) use of aspirin: Secondary | ICD-10-CM | POA: Insufficient documentation

## 2013-11-16 DIAGNOSIS — Z3202 Encounter for pregnancy test, result negative: Secondary | ICD-10-CM | POA: Insufficient documentation

## 2013-11-16 LAB — RAPID STREP SCREEN (MED CTR MEBANE ONLY): Streptococcus, Group A Screen (Direct): NEGATIVE

## 2013-11-16 NOTE — ED Provider Notes (Signed)
CSN: [id number]     Arrival date & time 11/16/13  2021 History   First MD Initiated Contact with Patient 11/16/13 2304     Chief Complaint  Patient presents with  . Sore Throat  . Nausea  . Headache     (Consider location/radiation/quality/duration/timing/severity/associated sxs/prior Treatment) HPI Pt is a 28yo female with hx of anxiety, chronic headaches, pilonidal cysts, jaw snapping, and menstrual problem presenting to ED c/o sore throat, associated with headache that started 3 days ago as well as nausea that started 1 week ago. Pain is worse with swallowing, 9/10, burning sensation.  Pt also reports cough with "mucous" production.  Pt is a CNA, works at a nursing home. Denies recent travel.  Pt has tried OTC ibuprofen w/o relief.  Denies difficulty breathing or swallowing liquids. Denies fever.  LMP 6/19.  Pt states she does have mirana in place, however, was scheduled to have pregnancy test with OBGYN on Wednesday, 6/24.    Past Medical History  Diagnosis Date  . Anxiety     no current med.  . Pilonidal cyst 02/2013  . Jaw snapping     states jaw pops if opens mouth too wide  . Menstrual bleeding problem     menses from 12/22/2012 - current (03/06/2013)  . Chronic headaches    Past Surgical History  Procedure Laterality Date  . Wisdom tooth extraction    . Pilonidal cyst excision N/A 03/12/2013    Procedure: CYST EXCISION PILONIDAL EXTENSIVE;  Surgeon: Shelly Rubensteinouglas A Blackman, MD;  Location: [facility];  Service: General;  Laterality: N/A;   Family History  Problem Relation Age of Onset  . Hypertension Mother   . Hyperlipidemia Mother   . Diabetes Mother   . Thyroid disease Mother   . Diabetes Father   . Hypertension Father   . Alcohol abuse Father   . Breast cancer Maternal Aunt   . Lung cancer Paternal Grandfather    History  Substance Use Topics  . Smoking status: Former Smoker -- 0.25 packs/day for 6 years    Types: Cigarettes    Quit date:  06/27/2013  . Smokeless tobacco: Never Used     Comment: 3-5 cig./day  . Alcohol Use: 0.0 oz/week     Comment: socially   OB History   Grav Para Term Preterm Abortions TAB SAB Ect Mult Living   0              Review of Systems  Constitutional: Negative for fever and chills.  HENT: Positive for congestion and sore throat. Negative for ear pain, rhinorrhea, trouble swallowing and voice change.   Respiratory: Positive for cough. Negative for shortness of breath, wheezing and stridor.   Cardiovascular: Negative for chest pain and palpitations.  Gastrointestinal: Positive for nausea. Negative for vomiting and abdominal pain.  Neurological: Positive for headaches. Negative for dizziness and light-headedness.  All other systems reviewed and are negative.     Allergies  Orange syrup  Home Medications   Prior to Admission medications   Medication Sig Start Date End Date Taking? Authorizing Provider  aspirin 325 MG tablet Take 1,300 mg by mouth daily.   Yes Historical Provider, MD  citalopram (CELEXA) 20 MG tablet Take 1 tablet (20 mg total) by mouth daily. 07/31/13  Yes Pecola LawlessWilliam F Hopper, MD  ibuprofen (ADVIL,MOTRIN) 200 MG tablet Take 200 mg by mouth every 6 (six) hours as needed (pain).   Yes Historical Provider, MD   BP 121/49  Pulse 93  Temp(Src) 99.7 F (37.6 C) (Oral)  Resp 22  SpO2 98%  LMP 11/13/2013 Physical Exam  Nursing note and vitals reviewed. Constitutional: She appears well-developed and well-nourished. No distress.  Obese female lying in exam bed, NAD.  HENT:  Head: Normocephalic and atraumatic.  Right Ear: Hearing, tympanic membrane, external ear and ear canal normal.  Left Ear: Hearing, tympanic membrane, external ear and ear canal normal.  Nose: Mucosal edema present.  Mouth/Throat: Uvula is midline and mucous membranes are normal. No trismus in the jaw. No uvula swelling. Oropharyngeal exudate, posterior oropharyngeal edema and posterior oropharyngeal  erythema present. No tonsillar abscesses.  Eyes: Conjunctivae are normal. No scleral icterus.  Neck: Normal range of motion.  Cardiovascular: Normal rate, regular rhythm and normal heart sounds.   Pulmonary/Chest: Effort normal and breath sounds normal. No respiratory distress. She has no wheezes. She has no rales. She exhibits no tenderness.  Abdominal: Soft. Bowel sounds are normal. She exhibits no distension and no mass. There is no tenderness. There is no rebound and no guarding.  Musculoskeletal: Normal range of motion.  Neurological: She is alert.  Skin: Skin is warm and dry. She is not diaphoretic.    ED Course  Procedures (including critical care time) Labs Review Labs Reviewed  RAPID STREP SCREEN  CULTURE, GROUP A STREP  POC URINE PREG, ED    Imaging Review No results found.   EKG Interpretation None      MDM   Final diagnoses:  Tonsillitis with exudate  Cough    Pt is a 27yo female c/o sore throat with cough, headache, and nausea that started 3 days ago. Vitals: unremarkable.  Pt appears well, non-toxic. No respiratory distress. Oropharyngeal exam-bilateral tonsillar erythema, edema, and exudates.  No uvula swelling. No tonsillar abscess.  Lungs: CTAB.   Rapid strep: negative.  Culture sent.   Urine Preg performed as pt c/o nausea x1 week and was planning to f/u with GYN for pregnancy test. Urine preg: negative.  Will tx pt symptomatically for tonsillitis.  Symptoms likely viral in nature. Advised pt to use acetaminophen and ibuprofen as needed for fever and pain. Encouraged rest and fluids. Return precautions provided. Advised to f/u with PCP for recheck of symptoms as needed. Pt verbalized understanding and agreement with tx plan.      Junius FinnerErin O'Malley, PA-C 11/17/13 0025

## 2013-11-16 NOTE — ED Notes (Signed)
Pt presents with c/o nausea, sore throat, and headache. Pt says that the nausea has been going on for about one week and she has an appointment this Wednesday with OBGYN to see if she is pregnant. Pt also c/o sore throat and headache that started around Friday of last week.

## 2013-11-17 LAB — POC URINE PREG, ED: Preg Test, Ur: NEGATIVE

## 2013-11-17 NOTE — ED Provider Notes (Signed)
Medical screening examination/treatment/procedure(s) were performed by non-physician practitioner and as supervising physician I was immediately available for consultation/collaboration.   EKG Interpretation None       April K Palumbo-Rasch, MD 11/17/13 [id number]

## 2013-11-17 NOTE — Discharge Instructions (Signed)
Cough, Adult ° A cough is a reflex. It helps you clear your throat and airways. A cough can help heal your body. A cough can last 2 or 3 weeks (acute) or may last more than 8 weeks (chronic). Some common causes of a cough can include an infection, allergy, or a cold. °HOME CARE °· Only take medicine as told by your doctor. °· If given, take your medicines (antibiotics) as told. Finish them even if you start to feel better. °· Use a cold steam vaporizer or humidier in your home. This can help loosen thick spit (secretions). °· Sleep so you are almost sitting up (semi-upright). Use pillows to do this. This helps reduce coughing. °· Rest as needed. °· Stop smoking if you smoke. °GET HELP RIGHT AWAY IF: °· You have yellowish-white fluid (pus) in your thick spit. °· Your cough gets worse. °· Your medicine does not reduce coughing, and you are losing sleep. °· You cough up blood. °· You have trouble breathing. °· Your pain gets worse and medicine does not help. °· You have a fever. °MAKE SURE YOU:  °· Understand these instructions. °· Will watch your condition. °· Will get help right away if you are not doing well or get worse. °Document Released: 01/25/2011 Document Revised: 08/06/2011 Document Reviewed: 01/25/2011 °[employer]® Patient Information ©2015 [employer]. This information is not intended to replace advice given to you by your health care provider. Make sure you discuss any questions you have with your health care provider. ° °

## 2013-11-18 ENCOUNTER — Ambulatory Visit: Payer: BC Managed Care – PPO | Admitting: Gynecology

## 2013-11-18 LAB — CULTURE, GROUP A STREP

## 2013-11-23 ENCOUNTER — Ambulatory Visit (INDEPENDENT_AMBULATORY_CARE_PROVIDER_SITE_OTHER): Payer: BC Managed Care – PPO | Admitting: Internal Medicine

## 2013-11-23 ENCOUNTER — Encounter: Payer: Self-pay | Admitting: Internal Medicine

## 2013-11-23 VITALS — BP 122/64 | HR 90 | Temp 98.6°F | Resp 16 | Ht 62.5 in | Wt 252.0 lb

## 2013-11-23 DIAGNOSIS — J029 Acute pharyngitis, unspecified: Secondary | ICD-10-CM

## 2013-11-23 NOTE — Patient Instructions (Signed)

## 2013-11-23 NOTE — Progress Notes (Signed)
   Subjective:    Patient ID: Traci Peterson, female    DOB: [birth year], 28 y.o.   MRN: [id number]  HPI Comments: She developed a ST, fever, and fatigue about one week ago. She was seen in the ER and RSS was negative. She is feeling much better today and returns for f/up.     Review of Systems  Constitutional: Positive for fatigue. Negative for fever, chills, diaphoresis, appetite change and unexpected weight change.  HENT: Negative.  Negative for congestion, facial swelling, rhinorrhea, sinus pressure, sore throat, tinnitus, trouble swallowing and voice change.   Eyes: Negative.   Respiratory: Negative.  Negative for apnea, cough, choking, chest tightness, shortness of breath, wheezing and stridor.   Cardiovascular: Negative.  Negative for chest pain, palpitations and leg swelling.  Gastrointestinal: Negative.  Negative for nausea, vomiting, abdominal pain, diarrhea, constipation and blood in stool.  Endocrine: Negative.   Genitourinary: Negative.   Musculoskeletal: Negative.   Skin: Negative.  Negative for rash.  Allergic/Immunologic: Negative.   Neurological: Negative.   Hematological: Negative.  Negative for adenopathy. Does not bruise/bleed easily.  Psychiatric/Behavioral: Negative.        Objective:   Physical Exam  Vitals reviewed. Constitutional: She is oriented to person, place, and time. She appears well-developed and well-nourished.  Non-toxic appearance. She does not have a sickly appearance. She does not appear ill. No distress.  HENT:  Head: Normocephalic and atraumatic.  Right Ear: Hearing, tympanic membrane, external ear and ear canal normal.  Left Ear: Hearing, tympanic membrane, external ear and ear canal normal.  Mouth/Throat: Oropharynx is clear and moist and mucous membranes are normal. Mucous membranes are not pale, not dry and not cyanotic. No oral lesions. No trismus in the jaw. No uvula swelling. No oropharyngeal exudate, posterior oropharyngeal edema,  posterior oropharyngeal erythema or tonsillar abscesses.  Eyes: Conjunctivae are normal. Right eye exhibits no discharge. Left eye exhibits no discharge. No scleral icterus.  Neck: Normal range of motion. Neck supple. No JVD present. No tracheal deviation present. No thyromegaly present.  Cardiovascular: Normal rate, regular rhythm, normal heart sounds and intact distal pulses.  Exam reveals no gallop and no friction rub.   No murmur heard. Pulmonary/Chest: Effort normal and breath sounds normal. No stridor. No respiratory distress. She has no wheezes. She has no rales. She exhibits no tenderness.  Abdominal: Soft. Bowel sounds are normal. She exhibits no distension and no mass. There is no tenderness. There is no rebound and no guarding.  Musculoskeletal: Normal range of motion. She exhibits no edema and no tenderness.  Lymphadenopathy:    She has no cervical adenopathy.  Neurological: She is oriented to person, place, and time.  Skin: Skin is warm and dry. No rash noted. She is not diaphoretic. No erythema. No pallor.     Lab Results  Component Value Date   WBC 5.6 08/08/2012   HGB 13.8 03/12/2013   HCT 41.2 08/08/2012   PLT 238.0 08/08/2012   GLUCOSE 90 08/08/2012   CHOL 164 12/31/2011   TRIG 129.0 12/31/2011   HDL 40.60 12/31/2011   LDLCALC 98 12/31/2011   ALT 30 08/08/2012   AST 34 08/08/2012   NA 138 08/08/2012   K 4.1 08/08/2012   CL 107 08/08/2012   CREATININE 0.8 08/08/2012   BUN 6 08/08/2012   CO2 24 08/08/2012       Assessment & Plan:

## 2013-11-24 ENCOUNTER — Encounter: Payer: Self-pay | Admitting: Internal Medicine

## 2013-11-24 NOTE — Assessment & Plan Note (Signed)
This appears to have been viral and is resolving She did not want to do any lab test to screen for mono She will let me know if she develops any new symptoms

## 2013-11-30 ENCOUNTER — Ambulatory Visit (INDEPENDENT_AMBULATORY_CARE_PROVIDER_SITE_OTHER): Payer: BC Managed Care – PPO | Admitting: Internal Medicine

## 2013-11-30 ENCOUNTER — Encounter: Payer: Self-pay | Admitting: Internal Medicine

## 2013-11-30 VITALS — BP 120/80 | HR 94 | Temp 98.5°F | Resp 16 | Ht 62.5 in | Wt 248.0 lb

## 2013-11-30 DIAGNOSIS — F32A Depression, unspecified: Secondary | ICD-10-CM

## 2013-11-30 DIAGNOSIS — F419 Anxiety disorder, unspecified: Secondary | ICD-10-CM

## 2013-11-30 DIAGNOSIS — Z23 Encounter for immunization: Secondary | ICD-10-CM

## 2013-11-30 DIAGNOSIS — F329 Major depressive disorder, single episode, unspecified: Secondary | ICD-10-CM

## 2013-11-30 DIAGNOSIS — Z0000 Encounter for general adult medical examination without abnormal findings: Secondary | ICD-10-CM

## 2013-11-30 MED ORDER — CITALOPRAM HYDROBROMIDE 20 MG PO TABS: 20.0000 mg | ORAL_TABLET | Freq: Every day | ORAL | Status: DC

## 2013-11-30 NOTE — Patient Instructions (Signed)
Preventive Care for Adults A healthy lifestyle and preventive care can promote health and wellness. Preventive health guidelines for women include the following key practices.  A routine yearly physical is a good way to check with your health care provider about your health and preventive screening. It is a chance to share any concerns and updates on your health and to receive a thorough exam.  Visit your dentist for a routine exam and preventive care every 6 months. Brush your teeth twice a day and floss once a day. Good oral hygiene prevents tooth decay and gum disease.  The frequency of eye exams is based on your age, health, family medical history, use of contact lenses, and other factors. Follow your health care provider's recommendations for frequency of eye exams.  Eat a healthy diet. Foods like vegetables, fruits, whole grains, low-fat dairy products, and lean protein foods contain the nutrients you need without too many calories. Decrease your intake of foods high in solid fats, added sugars, and salt. Eat the right amount of calories for you.Get information about a proper diet from your health care provider, if necessary.  Regular physical exercise is one of the most important things you can do for your health. Most adults should get at least 150 minutes of moderate-intensity exercise (any activity that increases your heart rate and causes you to sweat) each week. In addition, most adults need muscle-strengthening exercises on 2 or more days a week.  Maintain a healthy weight. The body mass index (BMI) is a screening tool to identify possible weight problems. It provides an estimate of body fat based on height and weight. Your health care provider can find your BMI, and can help you achieve or maintain a healthy weight.For adults 20 years and older:  A BMI below 18.5 is considered underweight.  A BMI of 18.5 to 24.9 is normal.  A BMI of 25 to 29.9 is considered overweight.  A BMI of  30 and above is considered obese.  Maintain normal blood lipids and cholesterol levels by exercising and minimizing your intake of saturated fat. Eat a balanced diet with plenty of fruit and vegetables. Blood tests for lipids and cholesterol should begin at age 52 and be repeated every 5 years. If your lipid or cholesterol levels are high, you are over 50, or you are at high risk for heart disease, you may need your cholesterol levels checked more frequently.Ongoing high lipid and cholesterol levels should be treated with medicines if diet and exercise are not working.  If you smoke, find out from your health care provider how to quit. If you do not use tobacco, do not start.  Lung cancer screening is recommended for adults aged 37-80 years who are at high risk for developing lung cancer because of a history of smoking. A yearly low-dose CT scan of the lungs is recommended for people who have at least a 30-pack-year history of smoking and are a current smoker or have quit within the past 15 years. A pack year of smoking is smoking an average of 1 pack of cigarettes a day for 1 year (for example: 1 pack a day for 30 years or 2 packs a day for 15 years). Yearly screening should continue until the smoker has stopped smoking for at least 15 years. Yearly screening should be stopped for people who develop a health problem that would prevent them from having lung cancer treatment.  If you are pregnant, do not drink alcohol. If you are breastfeeding,  be very cautious about drinking alcohol. If you are not pregnant and choose to drink alcohol, do not have more than 1 drink per day. One drink is considered to be 12 ounces (355 mL) of beer, 5 ounces (148 mL) of wine, or 1.5 ounces (44 mL) of liquor.  Avoid use of street drugs. Do not share needles with anyone. Ask for help if you need support or instructions about stopping the use of drugs.  High blood pressure causes heart disease and increases the risk of  stroke. Your blood pressure should be checked at least every 1 to 2 years. Ongoing high blood pressure should be treated with medicines if weight loss and exercise do not work.  If you are 3-86 years old, ask your health care provider if you should take aspirin to prevent strokes.  Diabetes screening involves taking a blood sample to check your fasting blood sugar level. This should be done once every 3 years, after age 67, if you are within normal weight and without risk factors for diabetes. Testing should be considered at a younger age or be carried out more frequently if you are overweight and have at least 1 risk factor for diabetes.  Breast cancer screening is essential preventive care for women. You should practice "breast self-awareness." This means understanding the normal appearance and feel of your breasts and may include breast self-examination. Any changes detected, no matter how small, should be reported to a health care provider. Women in their 8s and 30s should have a clinical breast exam (CBE) by a health care provider as part of a regular health exam every 1 to 3 years. After age 70, women should have a CBE every year. Starting at age 25, women should consider having a mammogram (breast X-ray test) every year. Women who have a family history of breast cancer should talk to their health care provider about genetic screening. Women at a high risk of breast cancer should talk to their health care providers about having an MRI and a mammogram every year.  Breast cancer gene (BRCA)-related cancer risk assessment is recommended for women who have family members with BRCA-related cancers. BRCA-related cancers include breast, ovarian, tubal, and peritoneal cancers. Having family members with these cancers may be associated with an increased risk for harmful changes (mutations) in the breast cancer genes BRCA1 and BRCA2. Results of the assessment will determine the need for genetic counseling and  BRCA1 and BRCA2 testing.  Routine pelvic exams to screen for cancer are no longer recommended for nonpregnant women who are considered low risk for cancer of the pelvic organs (ovaries, uterus, and vagina) and who do not have symptoms. Ask your health care provider if a screening pelvic exam is right for you.  If you have had past treatment for cervical cancer or a condition that could lead to cancer, you need Pap tests and screening for cancer for at least 20 years after your treatment. If Pap tests have been discontinued, your risk factors (such as having a new sexual partner) need to be reassessed to determine if screening should be resumed. Some women have medical problems that increase the chance of getting cervical cancer. In these cases, your health care provider may recommend more frequent screening and Pap tests.  The HPV test is an additional test that may be used for cervical cancer screening. The HPV test looks for the virus that can cause the cell changes on the cervix. The cells collected during the Pap test can be  tested for HPV. The HPV test could be used to screen women aged 47 years and older, and should be used in women of any age who have unclear Pap test results. After the age of 36, women should have HPV testing at the same frequency as a Pap test.  Colorectal cancer can be detected and often prevented. Most routine colorectal cancer screening begins at the age of 38 years and continues through age 58 years. However, your health care provider may recommend screening at an earlier age if you have risk factors for colon cancer. On a yearly basis, your health care provider may provide home test kits to check for hidden blood in the stool. Use of a small camera at the end of a tube, to directly examine the colon (sigmoidoscopy or colonoscopy), can detect the earliest forms of colorectal cancer. Talk to your health care provider about this at age 64, when routine screening begins. Direct  exam of the colon should be repeated every 5-10 years through age 21 years, unless early forms of pre-cancerous polyps or small growths are found.  People who are at an increased risk for hepatitis B should be screened for this virus. You are considered at high risk for hepatitis B if:  You were born in a country where hepatitis B occurs often. Talk with your health care provider about which countries are considered high risk.  Your parents were born in a high-risk country and you have not received a shot to protect against hepatitis B (hepatitis B vaccine).  You have HIV or AIDS.  You use needles to inject street drugs.  You live with, or have sex with, someone who has Hepatitis B.  You get hemodialysis treatment.  You take certain medicines for conditions like cancer, organ transplantation, and autoimmune conditions.  Hepatitis C blood testing is recommended for all people born from [birth year] through 1965 and any individual with known risks for hepatitis C.  Practice safe sex. Use condoms and avoid high-risk sexual practices to reduce the spread of sexually transmitted infections (STIs). STIs include gonorrhea, chlamydia, syphilis, trichomonas, herpes, HPV, and human immunodeficiency virus (HIV). Herpes, HIV, and HPV are viral illnesses that have no cure. They can result in disability, cancer, and death.  You should be screened for sexually transmitted illnesses (STIs) including gonorrhea and chlamydia if:  You are sexually active and are younger than 24 years.  You are older than 24 years and your health care provider tells you that you are at risk for this type of infection.  Your sexual activity has changed since you were last screened and you are at an increased risk for chlamydia or gonorrhea. Ask your health care provider if you are at risk.  If you are at risk of being infected with HIV, it is recommended that you take a prescription medicine daily to prevent HIV infection. This is  called preexposure prophylaxis (PrEP). You are considered at risk if:  You are a heterosexual woman, are sexually active, and are at increased risk for HIV infection.  You take drugs by injection.  You are sexually active with a partner who has HIV.  Talk with your health care provider about whether you are at high risk of being infected with HIV. If you choose to begin PrEP, you should first be tested for HIV. You should then be tested every 3 months for as long as you are taking PrEP.  Osteoporosis is a disease in which the bones lose minerals and strength  with aging. This can result in serious bone fractures or breaks. The risk of osteoporosis can be identified using a bone density scan. Women ages 65 years and over and women at risk for fractures or osteoporosis should discuss screening with their health care providers. Ask your health care provider whether you should take a calcium supplement or vitamin D to reduce the rate of osteoporosis.  Menopause can be associated with physical symptoms and risks. Hormone replacement therapy is available to decrease symptoms and risks. You should talk to your health care provider about whether hormone replacement therapy is right for you.  Use sunscreen. Apply sunscreen liberally and repeatedly throughout the day. You should seek shade when your shadow is shorter than you. Protect yourself by wearing long sleeves, pants, a wide-brimmed hat, and sunglasses year round, whenever you are outdoors.  Once a month, do a whole body skin exam, using a mirror to look at the skin on your back. Tell your health care provider of new moles, moles that have irregular borders, moles that are larger than a pencil eraser, or moles that have changed in shape or color.  Stay current with required vaccines (immunizations).  Influenza vaccine. All adults should be immunized every year.  Tetanus, diphtheria, and acellular pertussis (Td, Tdap) vaccine. Pregnant women should  receive 1 dose of Tdap vaccine during each pregnancy. The dose should be obtained regardless of the length of time since the last dose. Immunization is preferred during the 27th-36th week of gestation. An adult who has not previously received Tdap or who does not know her vaccine status should receive 1 dose of Tdap. This initial dose should be followed by tetanus and diphtheria toxoids (Td) booster doses every 10 years. Adults with an unknown or incomplete history of completing a 3-dose immunization series with Td-containing vaccines should begin or complete a primary immunization series including a Tdap dose. Adults should receive a Td booster every 10 years.  Varicella vaccine. An adult without evidence of immunity to varicella should receive 2 doses or a second dose if she has previously received 1 dose. Pregnant females who do not have evidence of immunity should receive the first dose after pregnancy. This first dose should be obtained before leaving the health care facility. The second dose should be obtained 4-8 weeks after the first dose.  Human papillomavirus (HPV) vaccine. Females aged 13-26 years who have not received the vaccine previously should obtain the 3-dose series. The vaccine is not recommended for use in pregnant females. However, pregnancy testing is not needed before receiving a dose. If a female is found to be pregnant after receiving a dose, no treatment is needed. In that case, the remaining doses should be delayed until after the pregnancy. Immunization is recommended for any person with an immunocompromised condition through the age of 26 years if she did not get any or all doses earlier. During the 3-dose series, the second dose should be obtained 4-8 weeks after the first dose. The third dose should be obtained 24 weeks after the first dose and 16 weeks after the second dose.  Zoster vaccine. One dose is recommended for adults aged 60 years or older unless certain conditions are  present.  Measles, mumps, and rubella (MMR) vaccine. Adults born before 1957 generally are considered immune to measles and mumps. Adults born in 1957 or later should have 1 or more doses of MMR vaccine unless there is a contraindication to the vaccine or there is laboratory evidence of immunity to   each of the three diseases. A routine second dose of MMR vaccine should be obtained at least 28 days after the first dose for students attending postsecondary schools, health care workers, or international travelers. People who received inactivated measles vaccine or an unknown type of measles vaccine during 1963-1967 should receive 2 doses of MMR vaccine. People who received inactivated mumps vaccine or an unknown type of mumps vaccine before 1979 and are at high risk for mumps infection should consider immunization with 2 doses of MMR vaccine. For females of childbearing age, rubella immunity should be determined. If there is no evidence of immunity, females who are not pregnant should be vaccinated. If there is no evidence of immunity, females who are pregnant should delay immunization until after pregnancy. Unvaccinated health care workers born before 1957 who lack laboratory evidence of measles, mumps, or rubella immunity or laboratory confirmation of disease should consider measles and mumps immunization with 2 doses of MMR vaccine or rubella immunization with 1 dose of MMR vaccine.  Pneumococcal 13-valent conjugate (PCV13) vaccine. When indicated, a person who is uncertain of her immunization history and has no record of immunization should receive the PCV13 vaccine. An adult aged 19 years or older who has certain medical conditions and has not been previously immunized should receive 1 dose of PCV13 vaccine. This PCV13 should be followed with a dose of pneumococcal polysaccharide (PPSV23) vaccine. The PPSV23 vaccine dose should be obtained at least 8 weeks after the dose of PCV13 vaccine. An adult aged 19  years or older who has certain medical conditions and previously received 1 or more doses of PPSV23 vaccine should receive 1 dose of PCV13. The PCV13 vaccine dose should be obtained 1 or more years after the last PPSV23 vaccine dose.  Pneumococcal polysaccharide (PPSV23) vaccine. When PCV13 is also indicated, PCV13 should be obtained first. All adults aged 65 years and older should be immunized. An adult younger than age 65 years who has certain medical conditions should be immunized. Any person who resides in a nursing home or long-term care facility should be immunized. An adult smoker should be immunized. People with an immunocompromised condition and certain other conditions should receive both PCV13 and PPSV23 vaccines. People with human immunodeficiency virus (HIV) infection should be immunized as soon as possible after diagnosis. Immunization during chemotherapy or radiation therapy should be avoided. Routine use of PPSV23 vaccine is not recommended for American Indians, [place] Natives, or people younger than 65 years unless there are medical conditions that require PPSV23 vaccine. When indicated, people who have unknown immunization and have no record of immunization should receive PPSV23 vaccine. One-time revaccination 5 years after the first dose of PPSV23 is recommended for people aged 19-64 years who have chronic kidney failure, nephrotic syndrome, asplenia, or immunocompromised conditions. People who received 1-2 doses of PPSV23 before age 65 years should receive another dose of PPSV23 vaccine at age 65 years or later if at least 5 years have passed since the previous dose. Doses of PPSV23 are not needed for people immunized with PPSV23 at or after age 65 years.  Meningococcal vaccine. Adults with asplenia or persistent complement component deficiencies should receive 2 doses of quadrivalent meningococcal conjugate (MenACWY-D) vaccine. The doses should be obtained at least 2 months apart.  Microbiologists working with certain meningococcal bacteria, military recruits, people at risk during an outbreak, and people who travel to or live in countries with a high rate of meningitis should be immunized. A first-year college student up through age   21 years who is living in a residence hall should receive a dose if she did not receive a dose on or after her 16th birthday. Adults who have certain high-risk conditions should receive one or more doses of vaccine.  Hepatitis A vaccine. Adults who wish to be protected from this disease, have certain high-risk conditions, work with hepatitis A-infected animals, work in hepatitis A research labs, or travel to or work in countries with a high rate of hepatitis A should be immunized. Adults who were previously unvaccinated and who anticipate close contact with an international adoptee during the first 60 days after arrival in the [place] from a country with a high rate of hepatitis A should be immunized.  Hepatitis B vaccine. Adults who wish to be protected from this disease, have certain high-risk conditions, may be exposed to blood or other infectious body fluids, are household contacts or sex partners of hepatitis B positive people, are clients or workers in certain care facilities, or travel to or work in countries with a high rate of hepatitis B should be immunized.  Haemophilus influenzae type b (Hib) vaccine. A previously unvaccinated person with asplenia or sickle cell disease or having a scheduled splenectomy should receive 1 dose of Hib vaccine. Regardless of previous immunization, a recipient of a hematopoietic stem cell transplant should receive a 3-dose series 6-12 months after her successful transplant. Hib vaccine is not recommended for adults with HIV infection. Preventive Services / Frequency Ages 43 to 39years  Blood pressure check.** / Every 1 to 2 years.  Lipid and cholesterol check.** / Every 5 years beginning at age  75.  Clinical breast exam.** / Every 3 years for women in their 32s and 74s.  BRCA-related cancer risk assessment.** / For women who have family members with a BRCA-related cancer (breast, ovarian, tubal, or peritoneal cancers).  Pap test.** / Every 2 years from ages 65 through 91. Every 3 years starting at age 34 through age 93 or 72 with a history of 3 consecutive normal Pap tests.  HPV screening.** / Every 3 years from ages 46 through ages 53 to 26 with a history of 3 consecutive normal Pap tests.  Hepatitis C blood test.** / For any individual with known risks for hepatitis C.  Skin self-exam. / Monthly.  Influenza vaccine. / Every year.  Tetanus, diphtheria, and acellular pertussis (Tdap, Td) vaccine.** / Consult your health care provider. Pregnant women should receive 1 dose of Tdap vaccine during each pregnancy. 1 dose of Td every 10 years.  Varicella vaccine.** / Consult your health care provider. Pregnant females who do not have evidence of immunity should receive the first dose after pregnancy.  HPV vaccine. / 3 doses over 6 months, if 70 and younger. The vaccine is not recommended for use in pregnant females. However, pregnancy testing is not needed before receiving a dose.  Measles, mumps, rubella (MMR) vaccine.** / You need at least 1 dose of MMR if you were born in 1957 or later. You may also need a 2nd dose. For females of childbearing age, rubella immunity should be determined. If there is no evidence of immunity, females who are not pregnant should be vaccinated. If there is no evidence of immunity, females who are pregnant should delay immunization until after pregnancy.  Pneumococcal 13-valent conjugate (PCV13) vaccine.** / Consult your health care provider.  Pneumococcal polysaccharide (PPSV23) vaccine.** / 1 to 2 doses if you smoke cigarettes or if you have certain conditions.  Meningococcal vaccine.** /  1 dose if you are age 70 to 51 years and a [place] living in a residence hall, or have one of several medical conditions, you need to get vaccinated against meningococcal disease. You may also need additional booster doses.  Hepatitis A vaccine.** / Consult your health care provider.  Hepatitis B vaccine.** / Consult your health care provider.  Haemophilus influenzae type b (Hib) vaccine.** / Consult your health care provider. Ages 40 to 64years  Blood pressure check.** / Every 1 to 2 years.  Lipid and cholesterol check.** / Every 5 years beginning at age 58 years.  Lung cancer screening. / Every year if you are aged 56-80 years and have a 30-pack-year history of smoking and currently smoke or have quit within the past 15 years. Yearly screening is stopped once you have quit smoking for at least 15 years or develop a health problem that would prevent you from having lung cancer treatment.  Clinical breast exam.** / Every year after age 35 years.  BRCA-related cancer risk assessment.** / For women who have family members with a BRCA-related cancer (breast, ovarian, tubal, or peritoneal cancers).  Mammogram.** / Every year beginning at age 109 years and continuing for as long as you are in good health. Consult with your health care provider.  Pap test.** / Every 3 years starting at age 44 years through age 94 or 70 years with a history of 3 consecutive normal Pap tests.  HPV screening.** / Every 3 years from ages 109 years through ages 50 to 30 years with a history of 3 consecutive normal Pap tests.  Fecal occult blood test (FOBT) of stool. / Every year beginning at age 73 years and continuing until age 59 years. You may not need to do this test if you get a colonoscopy every 10 years.  Flexible sigmoidoscopy or colonoscopy.** / Every 5 years for a flexible sigmoidoscopy or every 10 years for a colonoscopy beginning at age 68 years and continuing until age 12 years.  Hepatitis C blood test.** / For all people born from [birth year] through  1965 and any individual with known risks for hepatitis C.  Skin self-exam. / Monthly.  Influenza vaccine. / Every year.  Tetanus, diphtheria, and acellular pertussis (Tdap/Td) vaccine.** / Consult your health care provider. Pregnant women should receive 1 dose of Tdap vaccine during each pregnancy. 1 dose of Td every 10 years.  Varicella vaccine.** / Consult your health care provider. Pregnant females who do not have evidence of immunity should receive the first dose after pregnancy.  Zoster vaccine.** / 1 dose for adults aged 2 years or older.  Measles, mumps, rubella (MMR) vaccine.** / You need at least 1 dose of MMR if you were born in 1957 or later. You may also need a 2nd dose. For females of childbearing age, rubella immunity should be determined. If there is no evidence of immunity, females who are not pregnant should be vaccinated. If there is no evidence of immunity, females who are pregnant should delay immunization until after pregnancy.  Pneumococcal 13-valent conjugate (PCV13) vaccine.** / Consult your health care provider.  Pneumococcal polysaccharide (PPSV23) vaccine.** / 1 to 2 doses if you smoke cigarettes or if you have certain conditions.  Meningococcal vaccine.** / Consult your health care provider.  Hepatitis A vaccine.** / Consult your health care provider.  Hepatitis B vaccine.** / Consult your health care provider.  Haemophilus influenzae type b (Hib) vaccine.** / Consult your health care provider. Ages 48 years  and over  Blood pressure check.** / Every 1 to 2 years.  Lipid and cholesterol check.** / Every 5 years beginning at age 67 years.  Lung cancer screening. / Every year if you are aged 50-80 years and have a 30-pack-year history of smoking and currently smoke or have quit within the past 15 years. Yearly screening is stopped once you have quit smoking for at least 15 years or develop a health problem that would prevent you from having lung cancer  treatment.  Clinical breast exam.** / Every year after age 22 years.  BRCA-related cancer risk assessment.** / For women who have family members with a BRCA-related cancer (breast, ovarian, tubal, or peritoneal cancers).  Mammogram.** / Every year beginning at age 35 years and continuing for as long as you are in good health. Consult with your health care provider.  Pap test.** / Every 3 years starting at age 28 years through age 23 or 69 years with 3 consecutive normal Pap tests. Testing can be stopped between 65 and 70 years with 3 consecutive normal Pap tests and no abnormal Pap or HPV tests in the past 10 years.  HPV screening.** / Every 3 years from ages 10 years through ages 13 or 57 years with a history of 3 consecutive normal Pap tests. Testing can be stopped between 65 and 70 years with 3 consecutive normal Pap tests and no abnormal Pap or HPV tests in the past 10 years.  Fecal occult blood test (FOBT) of stool. / Every year beginning at age 61 years and continuing until age 56 years. You may not need to do this test if you get a colonoscopy every 10 years.  Flexible sigmoidoscopy or colonoscopy.** / Every 5 years for a flexible sigmoidoscopy or every 10 years for a colonoscopy beginning at age 41 years and continuing until age 69 years.  Hepatitis C blood test.** / For all people born from [birth year] through 1963 and any individual with known risks for hepatitis C.  Osteoporosis screening.** / A one-time screening for women ages 5 years and over and women at risk for fractures or osteoporosis.  Skin self-exam. / Monthly.  Influenza vaccine. / Every year.  Tetanus, diphtheria, and acellular pertussis (Tdap/Td) vaccine.** / 1 dose of Td every 10 years.  Varicella vaccine.** / Consult your health care provider.  Zoster vaccine.** / 1 dose for adults aged 8 years or older.  Pneumococcal 13-valent conjugate (PCV13) vaccine.** / Consult your health care provider.  Pneumococcal  polysaccharide (PPSV23) vaccine.** / 1 dose for all adults aged 73 years and older.  Meningococcal vaccine.** / Consult your health care provider.  Hepatitis A vaccine.** / Consult your health care provider.  Hepatitis B vaccine.** / Consult your health care provider.  Haemophilus influenzae type b (Hib) vaccine.** / Consult your health care provider. ** Family history and personal history of risk and conditions may change your health care provider's recommendations. Document Released: 07/10/2001 Document Revised: 05/19/2013 Document Reviewed: 10/09/2010 [facility] Patient Information 2015 [place], [place]. This information is not intended to replace advice given to you by your health care provider. Make sure you discuss any questions you have with your health care provider.

## 2013-11-30 NOTE — Assessment & Plan Note (Addendum)
PPD placed Forms were completed Exam done Labs ordered Pt ed material was given

## 2013-11-30 NOTE — Progress Notes (Signed)
   Subjective:    Patient ID: Traci Peterson, female    DOB: June 01, 1985, 28 y.o.   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 She feels well and offers no complaints.     Review of Systems  All other systems reviewed and are negative.      Objective:   Physical Exam  Vitals reviewed. Constitutional: She is oriented to person, place, and time. She appears well-developed and well-nourished. No distress.  HENT:  Head: Normocephalic and atraumatic.  Mouth/Throat: Oropharynx is clear and moist. No oropharyngeal exudate.  Eyes: Conjunctivae are normal. Right eye exhibits no discharge. Left eye exhibits no discharge. No scleral icterus.  Neck: Normal range of motion. Neck supple. No JVD present. No tracheal deviation present. No thyromegaly present.  Cardiovascular: Normal rate, regular rhythm, normal heart sounds and intact distal pulses.  Exam reveals no gallop and no friction rub.   No murmur heard. Pulmonary/Chest: Effort normal and breath sounds normal. No stridor. No respiratory distress. She has no wheezes. She has no rales. She exhibits no tenderness.  Abdominal: Soft. Bowel sounds are normal. She exhibits no distension and no mass. There is no tenderness. There is no rebound and no guarding.  Musculoskeletal: Normal range of motion. She exhibits no edema and no tenderness.  Lymphadenopathy:    She has no cervical adenopathy.  Neurological: She is oriented to person, place, and time.  Skin: Skin is warm and dry. No rash noted. She is not diaphoretic. No erythema. No pallor.  Psychiatric: She has a normal mood and affect. Her behavior is normal. Judgment and thought content normal.          Assessment & Plan:

## 2013-12-02 LAB — TB SKIN TEST
Induration: 0 mm
TB Skin Test: NEGATIVE

## 2013-12-15 ENCOUNTER — Ambulatory Visit (HOSPITAL_BASED_OUTPATIENT_CLINIC_OR_DEPARTMENT_OTHER): Payer: BC Managed Care – PPO | Attending: Pulmonary Disease | Admitting: Radiology

## 2013-12-15 VITALS — Ht 62.0 in | Wt 243.0 lb

## 2013-12-15 DIAGNOSIS — R0683 Snoring: Secondary | ICD-10-CM

## 2013-12-15 DIAGNOSIS — R0609 Other forms of dyspnea: Secondary | ICD-10-CM | POA: Insufficient documentation

## 2013-12-15 DIAGNOSIS — R0989 Other specified symptoms and signs involving the circulatory and respiratory systems: Secondary | ICD-10-CM | POA: Insufficient documentation

## 2013-12-23 ENCOUNTER — Telehealth: Payer: Self-pay | Admitting: Pulmonary Disease

## 2013-12-23 DIAGNOSIS — R0989 Other specified symptoms and signs involving the circulatory and respiratory systems: Secondary | ICD-10-CM

## 2013-12-23 DIAGNOSIS — R0609 Other forms of dyspnea: Secondary | ICD-10-CM

## 2013-12-23 NOTE — Sleep Study (Signed)
[facility]   NAME: Traci Peterson  DATE OF BIRTH: Jul 23, 1985  MEDICAL RECORD [id number]  LOCATION: [facility]   PHYSICIAN: Anushree Dorsi V.   DATE OF STUDY: 12/15/13   SLEEP STUDY TYPE: Nocturnal Polysomnogram   REFERRING PHYSICIAN: Oretha MilchAlva, Carry Ortez V, MD   INDICATION FOR STUDY:  28 year old with loud snoring and excessive daytime somnolence At the time of this study ,they weighed 243 pounds with a height of 5 ft 2 inches and the BMI of 44, neck size of 16 inches. Epworth sleepiness score was 15   This nocturnal polysomnogram was performed with a sleep technologist in attendance. EEG, EOG,EMG and respiratory parameters recorded. Sleep stages, arousals, limb movements and respiratory data was scored according to criteria laid out by the American Academy of sleep medicine.   SLEEP ARCHITECTURE: Lights out was at 2156 PM and lights on was at 519 AM. Total sleep time was 289 minutes with a sleep period time of 373 minutes and a sleep efficiency of 65 %. Sleep latency was 69 minutes with latency to REM sleep of 64 minutes and wake after sleep onset of 85 minutes. . Sleep stages as a percentage of total sleep time was N1 -12.6 %,N2- 60.6 % and REM sleep 26 % ( 77 minutes) . The longest period of REM sleep was around 4:30 AM.   AROUSAL DATA : There were 48  arousals with an arousal index of 10 events per hour. Most of these were spontaneous & 1 were associated with respiratory events  RESPIRATORY DATA: There were 0 obstructive apneas, 0 central apneas, 0 mixed apneas and 6 hypopneas with apnea -hypopnea index of 1.2 events per hour. There were 4 RERAs with an RDI of 2.1 events per hour. There was no relation to sleep stage or body position. Supine sleep was  noted  MOVEMENT/PARASOMNIA: There were 0 PLMS with a PLM index of 0 events per hour. The PLM arousal index was 0 per hour.  OXYGEN DATA: The lowest desaturation was 91 % during nREM sleep and the  desaturation index was 2.5 per hour.   CARDIAC DATA: The low heart rate was 37 beats per minute. The high heart rate recorded was an artifact. No arrhythmias were noted   DISCUSSION -Loud snoring was noted . She did not meet criteria for CPAP intervention.   IMPRESSION :  1. no evidence of obstructive sleep apnea  2. No evidence of cardiac arrhythmias,periodic limb movements or behavioral disturbance during sleep.  3. Sleep efficiency was decreased. Snoring was loud  RECOMMENDATION:  1. Treatment options for snoring include weight loss, nasal steroids ,CPAP therapy and/ or oral appliance.  2. Patient should be cautioned against driving when sleepy  3. They should be asked to avoid medications with sedative side effects    Oretha MilchALVA,Jonet Mathies V. MD Diplomate, American Board of Sleep Medicine    ELECTRONICALLY SIGNED ON: 12/23/2013  [facility]  PH: [phone] FX: [phone]  ACCREDITED BY THE AMERICAN ACADEMY OF SLEEP MEDICINE

## 2013-12-23 NOTE — Telephone Encounter (Signed)
lmomtcb x1 

## 2013-12-23 NOTE — Telephone Encounter (Signed)
She does not have significant sleep apnea. Snoring was loud - we can discuss options with an office visit

## 2013-12-24 NOTE — Telephone Encounter (Signed)
lmomtcb x2 for pt 

## 2013-12-24 NOTE — Telephone Encounter (Signed)
Pt returned call & can be reached at [phone].  Traci Peterson

## 2013-12-24 NOTE — Telephone Encounter (Signed)
Returning pt returned call [phone]

## 2013-12-24 NOTE — Telephone Encounter (Signed)
I spoke with patient about results and he verbalized understanding and had no questions appt scheduled to see RA tomorrow

## 2013-12-25 ENCOUNTER — Ambulatory Visit: Payer: BC Managed Care – PPO | Admitting: Pulmonary Disease

## 2014-01-08 ENCOUNTER — Encounter: Payer: BC Managed Care – PPO | Admitting: Gynecology

## 2014-03-05 ENCOUNTER — Encounter: Payer: BC Managed Care – PPO | Admitting: Gynecology

## 2014-05-16 ENCOUNTER — Emergency Department (HOSPITAL_COMMUNITY)
Admission: EM | Admit: 2014-05-16 | Discharge: 2014-05-16 | Disposition: A | Discharge status: Home / Self Care | Payer: BC Managed Care – PPO | Attending: Emergency Medicine | Admitting: Emergency Medicine

## 2014-05-16 ENCOUNTER — Encounter (HOSPITAL_COMMUNITY): Payer: Self-pay | Admitting: *Deleted

## 2014-05-16 DIAGNOSIS — Z8742 Personal history of other diseases of the female genital tract: Secondary | ICD-10-CM | POA: Insufficient documentation

## 2014-05-16 DIAGNOSIS — R000 Tachycardia, unspecified: Secondary | ICD-10-CM | POA: Insufficient documentation

## 2014-05-16 DIAGNOSIS — M545 Low back pain, unspecified: Secondary | ICD-10-CM

## 2014-05-16 DIAGNOSIS — Z87891 Personal history of nicotine dependence: Secondary | ICD-10-CM | POA: Insufficient documentation

## 2014-05-16 DIAGNOSIS — Z79899 Other long term (current) drug therapy: Secondary | ICD-10-CM | POA: Insufficient documentation

## 2014-05-16 DIAGNOSIS — G43809 Other migraine, not intractable, without status migrainosus: Secondary | ICD-10-CM | POA: Insufficient documentation

## 2014-05-16 DIAGNOSIS — Z7982 Long term (current) use of aspirin: Secondary | ICD-10-CM | POA: Insufficient documentation

## 2014-05-16 DIAGNOSIS — Y998 Other external cause status: Secondary | ICD-10-CM | POA: Insufficient documentation

## 2014-05-16 DIAGNOSIS — Z8739 Personal history of other diseases of the musculoskeletal system and connective tissue: Secondary | ICD-10-CM | POA: Insufficient documentation

## 2014-05-16 DIAGNOSIS — Y9389 Activity, other specified: Secondary | ICD-10-CM | POA: Insufficient documentation

## 2014-05-16 DIAGNOSIS — Z872 Personal history of diseases of the skin and subcutaneous tissue: Secondary | ICD-10-CM | POA: Insufficient documentation

## 2014-05-16 DIAGNOSIS — Y9289 Other specified places as the place of occurrence of the external cause: Secondary | ICD-10-CM | POA: Insufficient documentation

## 2014-05-16 DIAGNOSIS — X58XXXA Exposure to other specified factors, initial encounter: Secondary | ICD-10-CM | POA: Insufficient documentation

## 2014-05-16 DIAGNOSIS — G8929 Other chronic pain: Secondary | ICD-10-CM | POA: Insufficient documentation

## 2014-05-16 DIAGNOSIS — F419 Anxiety disorder, unspecified: Secondary | ICD-10-CM | POA: Insufficient documentation

## 2014-05-16 DIAGNOSIS — S39012A Strain of muscle, fascia and tendon of lower back, initial encounter: Secondary | ICD-10-CM | POA: Insufficient documentation

## 2014-05-16 LAB — I-STAT BETA HCG BLOOD, ED (MC, WL, AP ONLY): I-stat hCG, quantitative: <5 m[IU]/mL (ref <5)

## 2014-05-16 LAB — I-STAT CREATININE, ED: Creatinine, Ser: 0.8 mg/dL (ref 0.50–1.10)

## 2014-05-16 MED ORDER — METHOCARBAMOL 500 MG PO TABS: 500.0000 mg | ORAL_TABLET | Freq: Two times a day (BID) | ORAL | Status: DC

## 2014-05-16 MED ORDER — KETOROLAC TROMETHAMINE 30 MG/ML IJ SOLN
30.0000 mg | Freq: Once | INTRAMUSCULAR | Status: AC
  Administered 2014-05-16: 30 mg via INTRAVENOUS
  Filled 2014-05-16: qty 1

## 2014-05-16 MED ORDER — DIAZEPAM 5 MG/ML IJ SOLN
5.0000 mg | Freq: Once | INTRAMUSCULAR | Status: AC
  Administered 2014-05-16: 5 mg via INTRAVENOUS
  Filled 2014-05-16: qty 2

## 2014-05-16 MED ORDER — SODIUM CHLORIDE 0.9 % IV BOLUS (SEPSIS)
1000.0000 mL | INTRAVENOUS | Status: AC
  Administered 2014-05-16: 1000 mL via INTRAVENOUS

## 2014-05-16 NOTE — ED Provider Notes (Signed)
CSN: [id number]     Arrival date & time 05/16/14  2000 History   First MD Initiated Contact with Patient 05/16/14 2102     Chief Complaint  Patient presents with  . Headache     (Consider location/radiation/quality/duration/timing/severity/associated sxs/prior Treatment) Patient is a 28 y.o. female presenting with headaches. The history is provided by the patient and medical records. No language interpreter was used.  Headache Associated symptoms: back pain   Associated symptoms: no abdominal pain, no cough, no diarrhea, no fatigue, no fever, no nausea, no neck stiffness and no vomiting     Rubyann Lingle is a 28 y.o. female  with a hx of anxiety, irregular menses, chronic migraine headaches presents to the Emergency Department complaining of low back pain onset approximately 2 hours prior to arrival. Patient reports that she has a history of night terrors and she was having a nightmare tonight when she sat up in bed very quickly straining the muscles of her low back. 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 She's never had any back surgery. No numbness, tingling, weakness, loss of bowel or bladder control or saddle anesthesia. Patient reports that the pain in her back triggered a migraine headache and she now has a headache located at the bilateral temples. She reports this headache is severe however it is very similar to her previous migraine headaches. She denies sudden onset headache or thunderclap headache. She denies vision changes, nausea, abdominal pain, dizziness, syncope. She does endorse phonophobia and photophobia.  Patient has had no treatments prior to arrival.  She denies fevers, chills, neck pain, neck stiffness, chest pain, shortness of breath, abdominal pain, nausea, vomiting, diarrhea, weakness, dizziness, syncope, dysuria. Patient reports that her menstrual  cycle is 2 weeks late. She reports home pregnancy test was negative today.  Past Medical History  Diagnosis Date  . Anxiety     no current med.  . Pilonidal cyst 02/2013  . Jaw snapping     states jaw pops if opens mouth too wide  . Menstrual bleeding problem     menses from 12/22/2012 - current (03/06/2013)  . Chronic headaches    Past Surgical History  Procedure Laterality Date  . Wisdom tooth extraction    . Pilonidal cyst excision N/A 03/12/2013    Procedure: CYST EXCISION PILONIDAL EXTENSIVE;  Surgeon: Shelly Rubenstein, MD;  Location:  SURGERY CENTER;  Service: General;  Laterality: N/A;   Family History  Problem Relation Age of Onset  . Hypertension Mother   . Hyperlipidemia Mother   . Diabetes Mother   . Thyroid disease Mother   . Diabetes Father   . Hypertension Father   . Alcohol abuse Father   . Breast cancer Maternal Aunt   . Lung cancer Paternal Grandfather    History  Substance Use Topics  . Smoking status: Former Smoker -- 0.25 packs/day for 6 years    Types: Cigarettes    Quit date: 06/27/2013  . Smokeless tobacco: Never Used     Comment: 3-5 cig./day  . Alcohol Use: No     Comment: socially   OB History    Gravida Para Term Preterm AB TAB SAB Ectopic Multiple Living   0              Review of Systems  Constitutional: Negative for fever, diaphoresis, appetite change, fatigue and unexpected weight change.  HENT: Negative for mouth sores.   Eyes: Negative for visual disturbance.  Respiratory: Negative for cough, chest tightness, shortness of breath and wheezing.   Cardiovascular: Negative for chest pain.  Gastrointestinal: Negative for nausea, vomiting, abdominal pain, diarrhea and constipation.  Endocrine: Negative for polydipsia, polyphagia and polyuria.  Genitourinary: Negative for dysuria, urgency, frequency and hematuria.  Musculoskeletal: Positive for back pain. Negative for neck stiffness.  Skin: Negative for rash.   Allergic/Immunologic: Negative for immunocompromised state.  Neurological: Positive for headaches. Negative for syncope and light-headedness.  Hematological: Does not bruise/bleed easily.  Psychiatric/Behavioral: Negative for sleep disturbance. The patient is not nervous/anxious.       Allergies  Orange syrup  Home Medications   Prior to Admission medications   Medication Sig Start Date End Date Taking? Authorizing Provider  aspirin 325 MG tablet Take 1,300 mg by mouth daily.    Historical Provider, MD  citalopram (CELEXA) 20 MG tablet Take 1 tablet (20 mg total) by mouth daily. Patient not taking: Reported on 05/16/2014 11/30/13   Etta Grandchildhomas L Jones, MD  ibuprofen (ADVIL,MOTRIN) 200 MG tablet Take 200 mg by mouth every 6 (six) hours as needed (pain).    Historical Provider, MD  methocarbamol (ROBAXIN) 500 MG tablet Take 1 tablet (500 mg total) by mouth 2 (two) times daily. 05/16/14   Jacarius Handel, PA-C   BP 113/55 mmHg  Pulse 110  Temp(Src) 98 F (36.7 C)  Resp 20  Ht 2" (0.051 m)  Wt 263 lb (119.296 kg)  BMI 45865.44 kg/m2  SpO2 97%  LMP 04/25/2014 Physical Exam  Constitutional: She is oriented to person, place, and time. She appears well-developed and well-nourished. No distress.  HENT:  Head: Normocephalic and atraumatic.  Mouth/Throat: Oropharynx is clear and moist. No oropharyngeal exudate.  Eyes: Conjunctivae and EOM are normal. Pupils are equal, round, and reactive to light. No scleral icterus.  No horizontal, vertical or rotational nystagmus  Neck: Normal range of motion. Neck supple.  Full active and passive ROM without pain No midline or paraspinal tenderness No nuchal rigidity or meningeal signs  Cardiovascular: Regular rhythm, normal heart sounds and intact distal pulses.   No murmur heard. Tachycardia  Pulmonary/Chest: Effort normal and breath sounds normal. No respiratory distress. She has no wheezes. She has no rales.  Abdominal: Soft. Bowel sounds are  normal. She exhibits no distension. There is no tenderness. There is no rebound and no guarding.  Musculoskeletal: Normal range of motion.  Full range of motion of the T-spine and L-spine No tenderness to palpation of the spinous processes of the T-spine or L-spine Tenderness to palpation of the bilateral paraspinous muscles of the L-spine  Lymphadenopathy:    She has no cervical adenopathy.  Neurological: She is alert and oriented to person, place, and time. She has normal reflexes. No cranial nerve deficit. She exhibits normal muscle tone. Coordination normal.  Reflex Scores:      Bicep reflexes are 2+ on the right side and 2+ on the left side.      Brachioradialis reflexes are 2+ on the right side and 2+ on the left side.      Patellar reflexes are 2+ on the right side and 2+ on the left side.      Achilles reflexes are 2+ on the right side and 2+ on the left side. Mental Status:  Alert, oriented, thought content appropriate. Speech fluent without evidence of aphasia. Able to follow 2 step commands without difficulty.  Cranial Nerves:  II:  Peripheral visual fields grossly normal, pupils equal, round, reactive to light III,IV, VI: ptosis not present, extra-ocular motions intact bilaterally  V,VII: smile symmetric, facial light touch sensation equal VIII: hearing grossly normal bilaterally  IX,X: gag reflex present  XI: bilateral shoulder shrug equal and strong XII: midline tongue extension  Motor:  5/5 in upper and lower extremities bilaterally including strong and equal grip strength and dorsiflexion/plantar flexion Sensory: Pinprick and light touch normal in all extremities.  Deep Tendon Reflexes: 2+ and symmetric  Cerebellar: normal finger-to-nose with bilateral upper extremities Gait: normal gait and balance CV: distal pulses palpable throughout   Skin: Skin is warm and dry. No rash noted. She is not diaphoretic. No erythema.  Psychiatric: She has a normal mood and affect. Her  behavior is normal. Judgment and thought content normal.  Nursing note and vitals reviewed.   ED Course  Procedures (including critical care time) Labs Review Labs Reviewed  I-STAT BETA HCG BLOOD, ED ([facility], [facility], [facility] ONLY)  I-STAT CREATININE, ED    Imaging Review No results found.   EKG Interpretation None      MDM   Final diagnoses:  Bilateral low back pain without sciatica  Other migraine without status migrainosus, not intractable  Lumbar strain, initial encounter   Isla Pencentiqua Maberson presents with lumbar strain and migraine headache.  No red flags for headache or lumbar strain. Will give fluids, Toradol and Robaxin if pregnancy test is negative.  HCG - < 5 (lab not crossing over)  11:49 PM  Pt HA treated and improved while in ED.  Presentation is like pts typical HA and non concerning for [facility], ICH, Meningitis, or temporal arteritis. Pt is afebrile with no focal neuro deficits, nuchal rigidity, or change in vision.   Patient also with low back pain.  No neurological deficits and normal neuro exam.  Patient can walk but states is painful.  No loss of bowel or bladder control.  No concern for cauda equina.  No fever, night sweats, weight loss, h/o cancer, IVDU.  Patient back pain completely resolved after medication. RICE protocol and pain medicine indicated and discussed with patient.    I have personally reviewed patient's vitals, nursing note and any pertinent labs or imaging.  I performed an undressed physical exam.    It has been determined that no acute conditions requiring further emergency intervention are present at this time. The patient/guardian have been advised of the diagnosis and plan. I reviewed all labs and imaging including any potential incidental findings. We have discussed signs and symptoms that warrant return to the ED and they are listed in the discharge instructions.    Vital signs are stable at discharge.   BP 113/55 mmHg  Pulse 110  Temp(Src) 98 F  (36.7 C)  Resp 20  Ht 2" (0.051 m)  Wt 263 lb (119.296 kg)  BMI 45865.44 kg/m2  SpO2 97%  LMP 04/25/2014     Leroy Trim, PA-C 05/17/14 [id number]  Arby BarretteMarcy Pfeiffer, MD 05/17/14 2308

## 2014-05-16 NOTE — ED Notes (Signed)
The pt ius c/o a headache for 3 days and she is also c/o back pain where she has been lying in bed.  lmp nov 29

## 2014-05-16 NOTE — Discharge Instructions (Signed)
1. Medications: robaxin, Ibuprofen as needed, usual home medications 2. Treatment: rest, drink plenty of fluids, gentle stretching as discussed, alternate ice and heat 3. Follow Up: Please followup with your primary doctor in 3 days for discussion of your diagnoses and further evaluation after today's visit; if you do not have a primary care doctor use the resource guide provided to find one;  Return to the ER for worsening back pain, difficulty walking, loss of bowel or bladder control or other concerning symptoms    Back Exercises Back exercises help treat and prevent back injuries. The goal is to increase your strength in your belly (abdominal) and back muscles. These exercises can also help with flexibility. Start these exercises when told by your doctor. HOME CARE Back exercises include: Pelvic Tilt.  Lie on your back with your knees bent. Tilt your pelvis until the lower part of your back is against the floor. Hold this position 5 to 10 sec. Repeat this exercise 5 to 10 times. Knee to Chest.  Pull 1 knee up against your chest and hold for 20 to 30 seconds. Repeat this with the other knee. This may be done with the other leg straight or bent, whichever feels better. Then, pull both knees up against your chest. Sit-Ups or Curl-Ups.  Bend your knees 90 degrees. Start with tilting your pelvis, and do a partial, slow sit-up. Only lift your upper half 30 to 45 degrees off the floor. Take at least 2 to 3 seonds for each sit-up. Do not do sit-ups with your knees out straight. If partial sit-ups are difficult, simply do the above but with only tightening your belly (abdominal) muscles and holding it as told. Hip-Lift.  Lie on your back with your knees flexed 90 degrees. Push down with your feet and shoulders as you raise your hips 2 inches off the floor. Hold for 10 seconds, repeat 5 to 10 times. Back Arches.  Lie on your stomach. Prop yourself up on bent elbows. Slowly press on your hands,  causing an arch in your low back. Repeat 3 to 5 times. Shoulder-Lifts.  Lie face down with arms beside your body. Keep hips and belly pressed to floor as you slowly lift your head and shoulders off the floor. Do not overdo your exercises. Be careful in the beginning. Exercises may cause you some mild back discomfort. If the pain lasts for more than 15 minutes, stop the exercises until you see your doctor. Improvement with exercise for back problems is slow.  Document Released: 06/16/2010 Document Revised: 08/06/2011 Document Reviewed: 03/15/2011 [facility] Patient Information 2015 [place]. This information is not intended to replace advice given to you by your health care provider. Make sure you discuss any questions you have with your health care provider.

## 2014-05-28 NOTE — L&D Delivery Note (Signed)
Delivery Note At 3:40 AM a non-viable female was delivered via Vaginal, Spontaneous Delivery at home. EMS contacted and brought pt to [facility] for further management.  APGAR: 0, 0; weight 13.2 oz (374 g).   Placenta status: Intact, Spontaneous at 07:24 AM s/p 600 mcg Cytotec pr.  Cord: 3 vessels with with the following complications: None.  Cord pH: NA  Anesthesia: None  Episiotomy: None Lacerations: None Suture Repair: NA Est. Blood Loss (mL):    Mom to [facility].  Baby to [place].  Sherre Scarlet 11/12/2014, 07:45 AM

## 2014-07-19 ENCOUNTER — Emergency Department (HOSPITAL_COMMUNITY)
Admission: EM | Admit: 2014-07-19 | Discharge: 2014-07-19 | Disposition: A | Discharge status: Home / Self Care | Payer: Medicaid - Out of State | Attending: Emergency Medicine | Admitting: Emergency Medicine

## 2014-07-19 ENCOUNTER — Emergency Department (HOSPITAL_COMMUNITY): Payer: Medicaid - Out of State

## 2014-07-19 ENCOUNTER — Encounter (HOSPITAL_COMMUNITY): Payer: Self-pay | Admitting: Emergency Medicine

## 2014-07-19 DIAGNOSIS — Z7982 Long term (current) use of aspirin: Secondary | ICD-10-CM | POA: Insufficient documentation

## 2014-07-19 DIAGNOSIS — Z87891 Personal history of nicotine dependence: Secondary | ICD-10-CM | POA: Insufficient documentation

## 2014-07-19 DIAGNOSIS — Z79899 Other long term (current) drug therapy: Secondary | ICD-10-CM | POA: Diagnosis not present

## 2014-07-19 DIAGNOSIS — F419 Anxiety disorder, unspecified: Secondary | ICD-10-CM | POA: Diagnosis not present

## 2014-07-19 DIAGNOSIS — O209 Hemorrhage in early pregnancy, unspecified: Secondary | ICD-10-CM | POA: Diagnosis not present

## 2014-07-19 DIAGNOSIS — O99341 Other mental disorders complicating pregnancy, first trimester: Secondary | ICD-10-CM | POA: Insufficient documentation

## 2014-07-19 DIAGNOSIS — Z3A00 Weeks of gestation of pregnancy not specified: Secondary | ICD-10-CM | POA: Insufficient documentation

## 2014-07-19 DIAGNOSIS — O9989 Other specified diseases and conditions complicating pregnancy, childbirth and the puerperium: Secondary | ICD-10-CM | POA: Insufficient documentation

## 2014-07-19 DIAGNOSIS — R102 Pelvic and perineal pain: Secondary | ICD-10-CM | POA: Diagnosis not present

## 2014-07-19 DIAGNOSIS — Z872 Personal history of diseases of the skin and subcutaneous tissue: Secondary | ICD-10-CM | POA: Diagnosis not present

## 2014-07-19 DIAGNOSIS — Z8742 Personal history of other diseases of the female genital tract: Secondary | ICD-10-CM | POA: Insufficient documentation

## 2014-07-19 HISTORY — DX: Unspecified ovarian cyst, unspecified side: N83.209

## 2014-07-19 LAB — URINALYSIS, ROUTINE W REFLEX MICROSCOPIC
Bilirubin Urine: NEGATIVE
Glucose, UA: NEGATIVE mg/dL
Ketones, ur: NEGATIVE mg/dL
Leukocytes, UA: NEGATIVE
NITRITE: NEGATIVE
Protein, ur: NEGATIVE mg/dL
Specific Gravity, Urine: 1.025 (ref 1.005–1.030)
Urobilinogen, UA: 0.2 mg/dL (ref 0.0–1.0)
pH: 6 (ref 5.0–8.0)

## 2014-07-19 LAB — COMPREHENSIVE METABOLIC PANEL
ALT: 55 U/L — ABNORMAL HIGH (ref 0–35)
ANION GAP: 6 (ref 5–15)
AST: 36 U/L (ref 0–37)
Albumin: 3.6 g/dL (ref 3.5–5.2)
Alkaline Phosphatase: 61 U/L (ref 39–117)
BUN: 8 mg/dL (ref 6–23)
CO2: 21 mmol/L (ref 19–32)
Calcium: 8.8 mg/dL (ref 8.4–10.5)
Chloride: 107 mmol/L (ref 96–112)
Creatinine, Ser: 0.74 mg/dL (ref 0.50–1.10)
GFR calc Af Amer: >90 mL/min (ref >90)
Glucose, Bld: 136 mg/dL — ABNORMAL HIGH (ref 70–99)
Potassium: 3.9 mmol/L (ref 3.5–5.1)
SODIUM: 134 mmol/L — AB (ref 135–145)
Total Bilirubin: 0.6 mg/dL (ref 0.3–1.2)
Total Protein: 6.8 g/dL (ref 6.0–8.3)

## 2014-07-19 LAB — CBC WITH DIFFERENTIAL/PLATELET
Basophils Absolute: 0 10*3/uL (ref 0.0–0.1)
Basophils Relative: 0 % (ref 0–1)
EOS ABS: 0.2 10*3/uL (ref 0.0–0.7)
Eosinophils Relative: 2 % (ref 0–5)
HEMATOCRIT: 38.9 % (ref 36.0–46.0)
Hemoglobin: 13.2 g/dL (ref 12.0–15.0)
Lymphocytes Relative: 33 % (ref 12–46)
Lymphs Abs: 3.1 10*3/uL (ref 0.7–4.0)
MCH: 29.5 pg (ref 26.0–34.0)
MCHC: 33.9 g/dL (ref 30.0–36.0)
MCV: 87 fL (ref 78.0–100.0)
MONOS PCT: 9 % (ref 3–12)
Monocytes Absolute: 0.9 10*3/uL (ref 0.1–1.0)
Neutro Abs: 5.2 10*3/uL (ref 1.7–7.7)
Neutrophils Relative %: 56 % (ref 43–77)
PLATELETS: 234 10*3/uL (ref 150–400)
RBC: 4.47 MIL/uL (ref 3.87–5.11)
RDW: 14.2 % (ref 11.5–15.5)
WBC: 9.4 10*3/uL (ref 4.0–10.5)

## 2014-07-19 LAB — URINE MICROSCOPIC-ADD ON

## 2014-07-19 LAB — HCG, QUANTITATIVE, PREGNANCY: HCG, BETA CHAIN, QUANT, S: 379 m[IU]/mL — AB (ref <5)

## 2014-07-19 LAB — WET PREP, GENITAL
Clue Cells Wet Prep HPF POC: NONE SEEN
Trich, Wet Prep: NONE SEEN
WBC WET PREP: NONE SEEN
Yeast Wet Prep HPF POC: NONE SEEN

## 2014-07-19 LAB — POC URINE PREG, ED: Preg Test, Ur: POSITIVE — AB

## 2014-07-19 LAB — LIPASE, BLOOD: Lipase: 32 U/L (ref 11–59)

## 2014-07-19 NOTE — ED Notes (Addendum)
Pelvic cart set-up at bedside.  Pt unable to urinate at this time.  States she urinated in US.

## 2014-07-19 NOTE — ED Provider Notes (Signed)
5:07 AM Pelvic exam completed by me. On exam, patient has moderate amount of thick, brown discharge in vagina. No CMT. Cervical os closed. Right adnexal tenderness to palpation on bimanual exam. No other tenderness or masses palpated on bimanual exam.   Emilia BeckKaitlyn Kielyn Kardell, PA-C 07/19/14 [id number]  Loren Raceravid Yelverton, MD 07/19/14 [id number]

## 2014-07-19 NOTE — ED Notes (Signed)
Pt transported to US

## 2014-07-19 NOTE — ED Notes (Signed)
EDP at bedside  

## 2014-07-19 NOTE — ED Notes (Signed)
C/o constant sharp RLQ pain since 07/05/14.  Denies nausea, vomiting, and diarrhea.  Reports increased pain after urinating.  [place] and diagnosed with ovarian cyst.  LMP on 1/21 lasted 8 days.  Reports bright red "spotting" on 2/19 that only lasted 1 day.

## 2014-07-19 NOTE — ED Notes (Signed)
This RN at bedside with female PA during pelvic exam.  Patient provided complete privacy during exam.

## 2014-07-19 NOTE — Discharge Instructions (Signed)
Follow-up with an OB/GYN in 2 days to have your hCG level rechecked. You need to go to the emergency department immediately for worsening pain, vaginal bleeding, fever or for any concerns. You may take Tylenol for the pain.  Abdominal Pain, Women Abdominal (stomach, pelvic, or belly) pain can be caused by many things. It is important to tell your doctor:  The location of the pain.  Does it come and go or is it present all the time?  Are there things that start the pain (eating certain foods, exercise)?  Are there other symptoms associated with the pain (fever, nausea, vomiting, diarrhea)? All of this is helpful to know when trying to find the cause of the pain. CAUSES   Stomach: virus or bacteria infection, or ulcer.  Intestine: appendicitis (inflamed appendix), regional ileitis (Crohn's disease), ulcerative colitis (inflamed colon), irritable bowel syndrome, diverticulitis (inflamed diverticulum of the colon), or cancer of the stomach or intestine.  Gallbladder disease or stones in the gallbladder.  Kidney disease, kidney stones, or infection.  Pancreas infection or cancer.  Fibromyalgia (pain disorder).  Diseases of the female organs:  Uterus: fibroid (non-cancerous) tumors or infection.  Fallopian tubes: infection or tubal pregnancy.  Ovary: cysts or tumors.  Pelvic adhesions (scar tissue).  Endometriosis (uterus lining tissue growing in the pelvis and on the pelvic organs).  Pelvic congestion syndrome (female organs filling up with blood just before the menstrual period).  Pain with the menstrual period.  Pain with ovulation (producing an egg).  Pain with an IUD (intrauterine device, birth control) in the uterus.  Cancer of the female organs.  Functional pain (pain not caused by a disease, may improve without treatment).  Psychological pain.  Depression. DIAGNOSIS  Your doctor will decide the seriousness of your pain by doing an examination.  Blood  tests.  X-rays.  Ultrasound.  CT scan (computed tomography, special type of X-ray).  MRI (magnetic resonance imaging).  Cultures, for infection.  Barium enema (dye inserted in the large intestine, to better view it with X-rays).  Colonoscopy (looking in intestine with a lighted tube).  Laparoscopy (minor surgery, looking in abdomen with a lighted tube).  Major abdominal exploratory surgery (looking in abdomen with a large incision). TREATMENT  The treatment will depend on the cause of the pain.   Many cases can be observed and treated at home.  Over-the-counter medicines recommended by your caregiver.  Prescription medicine.  Antibiotics, for infection.  Birth control pills, for painful periods or for ovulation pain.  Hormone treatment, for endometriosis.  Nerve blocking injections.  Physical therapy.  Antidepressants.  Counseling with a psychologist or psychiatrist.  Minor or major surgery. HOME CARE INSTRUCTIONS   Do not take laxatives, unless directed by your caregiver.  Take over-the-counter pain medicine only if ordered by your caregiver. Do not take aspirin because it can cause an upset stomach or bleeding.  Try a clear liquid diet (broth or water) as ordered by your caregiver. Slowly move to a bland diet, as tolerated, if the pain is related to the stomach or intestine.  Have a thermometer and take your temperature several times a day, and record it.  Bed rest and sleep, if it helps the pain.  Avoid sexual intercourse, if it causes pain.  Avoid stressful situations.  Keep your follow-up appointments and tests, as your caregiver orders.  If the pain does not go away with medicine or surgery, you may try:  Acupuncture.  Relaxation exercises (yoga, meditation).  Group therapy.  Counseling.  SEEK MEDICAL CARE IF:   You notice certain foods cause stomach pain.  Your home care treatment is not helping your pain.  You need stronger pain  medicine.  You want your IUD removed.  You feel faint or lightheaded.  You develop nausea and vomiting.  You develop a rash.  You are having side effects or an allergy to your medicine. SEEK IMMEDIATE MEDICAL CARE IF:   Your pain does not go away or gets worse.  You have a fever.  Your pain is felt only in portions of the abdomen. The right side could possibly be appendicitis. The left lower portion of the abdomen could be colitis or diverticulitis.  You are passing blood in your stools (bright red or black tarry stools, with or without vomiting).  You have blood in your urine.  You develop chills, with or without a fever.  You pass out. MAKE SURE YOU:   Understand these instructions.  Will watch your condition.  Will get help right away if you are not doing well or get worse. Document Released: 03/11/2007 Document Revised: 09/28/2013 Document Reviewed: 03/31/2009 [facility] Patient Information 2015 [place], [place]. This information is not intended to replace advice given to you by your health care provider. Make sure you discuss any questions you have with your health care provider. Abdominal Pain During Pregnancy Abdominal pain is common in pregnancy. Most of the time, it does not cause harm. There are many causes of abdominal pain. Some causes are more serious than others. Some of the causes of abdominal pain in pregnancy are easily diagnosed. Occasionally, the diagnosis takes time to understand. Other times, the cause is not determined. Abdominal pain can be a sign that something is very wrong with the pregnancy, or the pain may have nothing to do with the pregnancy at all. For this reason, always tell your health care provider if you have any abdominal discomfort. HOME CARE INSTRUCTIONS  Monitor your abdominal pain for any changes. The following actions may help to alleviate any discomfort you are experiencing:  Do not have sexual intercourse or put anything in your vagina  until your symptoms go away completely.  Get plenty of rest until your pain improves.  Drink clear fluids if you feel nauseous. Avoid solid food as long as you are uncomfortable or nauseous.  Only take over-the-counter or prescription medicine as directed by your health care provider.  Keep all follow-up appointments with your health care provider. SEEK IMMEDIATE MEDICAL CARE IF:  You are bleeding, leaking fluid, or passing tissue from the vagina.  You have increasing pain or cramping.  You have persistent vomiting.  You have painful or bloody urination.  You have a fever.  You notice a decrease in your baby's movements.  You have extreme weakness or feel faint.  You have shortness of breath, with or without abdominal pain.  You develop a severe headache with abdominal pain.  You have abnormal vaginal discharge with abdominal pain.  You have persistent diarrhea.  You have abdominal pain that continues even after rest, or gets worse. MAKE SURE YOU:   Understand these instructions.  Will watch your condition.  Will get help right away if you are not doing well or get worse. Document Released: 05/14/2005 Document Revised: 03/04/2013 Document Reviewed: 12/11/2012 [facility] Patient Information 2015 [place], [place]. This information is not intended to replace advice given to you by your health care provider. Make sure you discuss any questions you have with your health care provider.

## 2014-07-19 NOTE — ED Provider Notes (Signed)
CSN: [id number]     Arrival date & time 07/19/14  [id number] History  This chart was scribed for Traci Raceravid Christerpher Clos, MD by Luisa DagoPriscilla Tutu, ED Scribe. This patient was seen in room D33C/D33C and the patient's care was started at 3:54 AM.    Chief Complaint  Patient presents with  . Abdominal Pain    The history is provided by the patient and medical records. No language interpreter was used.   HPI Comments: Traci Peterson is a 29 y.o. female who presents to the Emergency Department complaining of gradual onset persisted lower abdominal pain that started 07/06/2014. [place] [facility] [place] and was diagnosed with an ovarian cyst. She states that with this current episode, it woke her up out of her sleep. She states that the pain waxes and wanes.  LMP on 1/21 which lasted 8 days and one day of spotting last week. Pt denies any fever, neck pain, sore throat, visual disturbance, CP, cough, SOB, nausea, emesis, diarrhea, urinary symptoms, back pain, HA, weakness, numbness and rash as associated symptoms.     Past Medical History  Diagnosis Date  . Anxiety     no current med.  . Pilonidal cyst 02/2013  . Jaw snapping     states jaw pops if opens mouth too wide  . Menstrual bleeding problem     menses from 12/22/2012 - current (03/06/2013)  . Chronic headaches   . Ovarian cyst    Past Surgical History  Procedure Laterality Date  . Wisdom tooth extraction    . Pilonidal cyst excision N/A 03/12/2013    Procedure: CYST EXCISION PILONIDAL EXTENSIVE;  Surgeon: Shelly Rubensteinouglas A Blackman, MD;  Location: [facility];  Service: General;  Laterality: N/A;   Family History  Problem Relation Age of Onset  . Hypertension Mother   . Hyperlipidemia Mother   . Diabetes Mother   . Thyroid disease Mother   . Diabetes Father   . Hypertension Father   . Alcohol abuse Father   . Breast cancer Maternal Aunt   . Lung cancer Paternal Grandfather    History  Substance Use Topics  . Smoking  status: Former Smoker -- 0.25 packs/day for 6 years    Types: Cigarettes    Quit date: 06/27/2013  . Smokeless tobacco: Never Used     Comment: 3-5 cig./day  . Alcohol Use: No     Comment: socially   OB History    Gravida Para Term Preterm AB TAB SAB Ectopic Multiple Living   0              Review of Systems  Constitutional: Negative for fever and chills.  Respiratory: Negative for cough and shortness of breath.   Gastrointestinal: Positive for abdominal pain. Negative for nausea, vomiting and diarrhea.  Genitourinary: Positive for vaginal bleeding and pelvic pain. Negative for dysuria, frequency and flank pain.  Musculoskeletal: Negative for myalgias, back pain, neck pain and neck stiffness.  Skin: Negative for rash and wound.  Neurological: Negative for dizziness, seizures, syncope, weakness, light-headedness and numbness.  All other systems reviewed and are negative.     Allergies  Orange syrup  Home Medications   Prior to Admission medications   Medication Sig Start Date End Date Taking? Authorizing Provider  ibuprofen (ADVIL,MOTRIN) 200 MG tablet Take 200 mg by mouth every 6 (six) hours as needed (pain).   Yes Historical Provider, MD  aspirin 325 MG tablet Take 1,300 mg by mouth daily.  Historical Provider, MD  citalopram (CELEXA) 20 MG tablet Take 1 tablet (20 mg total) by mouth daily. Patient not taking: Reported on 05/16/2014 11/30/13   Etta Grandchild, MD  methocarbamol (ROBAXIN) 500 MG tablet Take 1 tablet (500 mg total) by mouth 2 (two) times daily. Patient not taking: Reported on 07/19/2014 05/16/14   Dahlia Client Muthersbaugh, PA-C   BP 126/58 mmHg  Pulse 115  Temp(Src) 98.4 F (36.9 C) (Oral)  Resp 16  Ht  (1.575 m)  Wt 288 lb 12.8 oz (131 kg)  BMI 52.81 kg/m2  SpO2 100%  LMP 06/17/2014  Physical Exam  Constitutional: She is oriented to person, place, and time. She appears well-developed and well-nourished. No distress.  HENT:  Head: Normocephalic and  atraumatic.  Mouth/Throat: Oropharynx is clear and moist.  Eyes: EOM are normal. Pupils are equal, round, and reactive to light.  Neck: Normal range of motion. Neck supple.  Cardiovascular: Normal rate and regular rhythm.   Pulmonary/Chest: Effort normal and breath sounds normal. No respiratory distress. She has no wheezes. She has no rales.  Abdominal: Soft. Bowel sounds are normal. She exhibits no distension and no mass. There is tenderness (mild lower abdominal tenderness to palpation. There is no rebound or guarding.). There is no rebound and no guarding.  Musculoskeletal: Normal range of motion. She exhibits no edema or tenderness.  No CVA tenderness bilaterally.  Neurological: She is alert and oriented to person, place, and time.  Skin: Skin is warm and dry. No rash noted. No erythema.  Psychiatric: She has a normal mood and affect. Her behavior is normal.  Nursing note and vitals reviewed.   ED Course  Procedures (including critical care time)  DIAGNOSTIC STUDIES: Oxygen Saturation is 100% on  RA, normal by my interpretation.    COORDINATION OF CARE: 3:57 AM- Pt advised of plan for treatment and pt agrees.  Labs Review Labs Reviewed  COMPREHENSIVE METABOLIC PANEL - Abnormal; Notable for the following:    Sodium 134 (*)    Glucose, Bld 136 (*)    ALT 55 (*)    All other components within normal limits  URINALYSIS, ROUTINE W REFLEX MICROSCOPIC - Abnormal; Notable for the following:    Hgb urine dipstick TRACE (*)    All other components within normal limits  HCG, QUANTITATIVE, PREGNANCY - Abnormal; Notable for the following:    hCG, Beta Chain, Quant, S 379 (*)    All other components within normal limits  POC URINE PREG, ED - Abnormal; Notable for the following:    Preg Test, Ur POSITIVE (*)    All other components within normal limits  WET PREP, GENITAL  CBC WITH DIFFERENTIAL/PLATELET  LIPASE, BLOOD  URINE MICROSCOPIC-ADD ON  GC/CHLAMYDIA PROBE AMP ([place])     Imaging Review No results found.   EKG Interpretation None      MDM   Final diagnoses:  Pelvic pain in female    I personally performed the services described in this documentation, which was scribed in my presence. The recorded information has been reviewed and is accurate.  Discuss results of pregnancy and ultrasound with the patient. Her symptoms have significantly improved. Abdomen remained soft. She is encouraged to follow-up with OB/GYN for repeat hCG in 2 days. She's been given return precautions and is voiced understanding.  Traci Racer, MD 07/28/14 [phone]

## 2014-07-20 LAB — GC/CHLAMYDIA PROBE AMP (~~LOC~~) NOT AT ARMC
CHLAMYDIA, DNA PROBE: NEGATIVE
NEISSERIA GONORRHEA: NEGATIVE

## 2014-08-03 ENCOUNTER — Inpatient Hospital Stay (HOSPITAL_COMMUNITY)
Admission: AD | Admit: 2014-08-03 | Discharge: 2014-08-03 | Disposition: A | Discharge status: Home / Self Care | Payer: Medicaid - Out of State | Source: Ambulatory Visit | Attending: Family Medicine | Admitting: Family Medicine

## 2014-08-03 ENCOUNTER — Encounter (HOSPITAL_COMMUNITY): Payer: Self-pay | Admitting: *Deleted

## 2014-08-03 DIAGNOSIS — A5402 Gonococcal vulvovaginitis, unspecified: Secondary | ICD-10-CM | POA: Diagnosis not present

## 2014-08-03 DIAGNOSIS — Z3A01 Less than 8 weeks gestation of pregnancy: Secondary | ICD-10-CM | POA: Insufficient documentation

## 2014-08-03 DIAGNOSIS — O98219 Gonorrhea complicating pregnancy, unspecified trimester: Secondary | ICD-10-CM

## 2014-08-03 DIAGNOSIS — O98211 Gonorrhea complicating pregnancy, first trimester: Secondary | ICD-10-CM | POA: Diagnosis present

## 2014-08-03 MED ORDER — CEFTRIAXONE SODIUM 250 MG IJ SOLR
250.0000 mg | Freq: Once | INTRAMUSCULAR | Status: AC
  Administered 2014-08-03: 250 mg via INTRAMUSCULAR
  Filled 2014-08-03: qty 250

## 2014-08-03 MED ORDER — AZITHROMYCIN 250 MG PO TABS
1000.0000 mg | ORAL_TABLET | Freq: Once | ORAL | Status: AC
  Administered 2014-08-03: 1000 mg via ORAL
  Filled 2014-08-03: qty 4

## 2014-08-03 NOTE — MAU Provider Note (Signed)
Traci Peterson 29 y.o. G1P0 @[redacted]w[redacted]d  presents to MAU reporting that she was diagnosed with gonorrhea on 07/29/14. She was given injection at that time.  Later the health department contacted her and informed her that she also needed oral antibiotics.   Discussed with Dr. Adrian BlackwaterStinson.  Will give regular gonorrhea treatment to be conservative.  Will not test as it is too soon.   A: gonorrhea  P:  Rocephin 250  IM, Azith 1 gram po Have test of cure in 3 weeks PNV qd  [facility] to be tested in 3 weeks also as he was given alternative regimen.  Patient may return to MAU as needed or if her condition were to change or worsen

## 2014-08-03 NOTE — MAU Note (Addendum)
Dx'd with threatened miscarriage on 07/27/14.  [place],  [facility], was told level was 8, 553 on 07/29/14.  Tested pos for gonorrhea on 07/29/14 also.  [place] but the Health Dept contacted the pt & she was told she did not receive the correct medication.  Pt denies pain, bleeding or discharge today.

## 2014-08-03 NOTE — Discharge Instructions (Signed)
Pregnancy and Sexually Transmitted Diseases A sexually transmitted disease (STD) is a disease or infection that may be passed (transmitted) from person to person, usually during sexual activity. This may happen by way of saliva, semen, blood, vaginal mucus, or urine. An STD can be caused by bacteria, viruses, or parasites.  During pregnancy, STDs can be dangerous for both you and your unborn baby. It is important to take steps to reduce your chances of getting an STD. Also, you need to be looked at by your health care provider right away if you think you may have an STD or may have been exposed to an STD. Diagnosis and treatment will depend on the type of STD. WHAT ARE SOME COMMON STDs? There are different types of STDs. Some STDs that cause problems in pregnancy include:  Gonorrhea.   Chlamydia.   Syphilis.   HIV and AIDS.   Genital herpes.   Hepatitis.   Genital warts.   Human papillomavirus (HPV). STDs that do not affect the baby include:   Trichomonas.   Pubic lice.  WHAT ARE THE POSSIBLE EFFECTS OF STDs DURING PREGNANCY? STDs can have various effects during pregnancy. STDs can cause:   Stillbirth.   Miscarriage.   Premature labor.   Premature rupture of the membranes.   Serious birth defects or deformities.   Infection of the amniotic sac.   Infections that occur after birth (postpartum) in you and the baby.   Slowed growth of the baby before birth.   Illnesses in newborns.  WHAT ARE COMMON SYMPTOMS OF STDs? Different STDs have different symptoms. Some women may not have any symptoms. If symptoms are present, they may include:  Painful or bloody urination.   Pain in the pelvis, abdomen, vagina, anus, throat, or eyes.  A skin rash, itching, or irritation.   Growths, ulcerations, blisters, or sores in the genital and anal areas.   Fever.   Abnormal vaginal discharge with or without bad odor.   Pain or bleeding during sexual  intercourse.   Yellow skin and eyes (jaundice). This is seen with hepatitis.   Swollen glands in the groin area.  Even if symptoms are not present, an STD can still be passed to another person during sexual contact.  HOW ARE STDs DIAGNOSED? Your health care provider can determine if you have an STD through different tests. These can include blood tests, urine tests, and tests performed during a pelvic exam. You should be screened for sexually transmitted illnesses (STIs), including gonorrhea and chlamydia if:   You are sexually active and are younger than 27 years old.  You are older than 27 years old and your health care provider tells you that you are at risk for this type of infection.  Your sexual activity has changed since you were last screened, and you are at an increased risk for chlamydia or gonorrhea. Ask your health care provider if you are at risk. HOW CAN I REDUCE MY RISK OF GETTING AN STD?  Take these steps to reduce your risk of getting an STD:  Use a latex condom or female condom during sexual intercourse.   Use dental dams and water-soluble lubricants during sexual activity. Do not use petroleum jelly or oils.  Avoid having multiple sex partners.  Do not have sex with someone who has other sex partners.  Do not have sex with anyone you do not know or who is at high risk for an STD.   Avoid risky sex acts that can break the  skin.  Do not have sex if you have open sores on your mouth or skin.  Avoid engaging in oral and anal sex acts.   Get the hepatitis vaccine. It is safe for pregnant women.  WHAT SHOULD I DO IF I THINK I HAVE AN STD?  See your health care provider.  Tell your sexual partner(s). They should be tested and treated for any STDs.  Do not have sex until your health care provider says it is okay. WHEN SHOULD I GET IMMEDIATE MEDICAL CARE? Contact your health care provider right away if:   You have any symptoms of an STD.  You think you  or your sex partner has an STD, even if there are no symptoms.  You think you may have been exposed to an STD. Document Released: 06/21/2004 Document Revised: 09/28/2013 Document Reviewed: 12/11/2012 [facility] Patient Information 2015 [place], [place]. This information is not intended to replace advice given to you by your health care provider. Make sure you discuss any questions you have with your health care provider.  Gonorrhea Gonorrhea is an infection that can cause serious problems. If left untreated, the infection may:   Damage the female or female organs.   Cause women to be unable to have children (sterility).   Harm a fetus if the infected woman is pregnant.  It is important to get treatment for gonorrhea as soon as possible. It is also necessary that all your sexual partners be tested for the infection.  CAUSES  Gonorrhea is caused by bacteria called Neisseria gonorrhoeae. The infection is spread from person to person, usually by sexual contact (such as by anal, vaginal, or oral means). A newborn can contract the infection from his or her mother during birth.  SYMPTOMS  Some people with gonorrhea do not have symptoms. Symptoms may be different in females and males.  Females The most common symptoms are:   Pain in the lower abdomen.   Fever with or without chills.  Other symptoms include:   Abnormal vaginal discharge.   Painful intercourse.   Burning or itching of the vagina or lips of the vagina.   Abnormal vaginal bleeding.   Pain when urinating.   Long-lasting (chronic) pain in the lower abdomen, especially during menstruation or intercourse.   Inability to become pregnant.   Going into premature labor.   Irritation, pain, bleeding, or discharge from the rectum. This may occur if the infection was spread by anal sex.   Sore throat or swollen lymph nodes in the neck. This may occur if the infection was spread by oral sex.  Males The most common  symptoms are:   Discharge from the penis.   Pain or burning during urination.   Pain or swelling in the testicles. Other symptoms may include:   Irritation, pain, bleeding, or discharge from the rectum. This may occur if the infection was spread by anal sex.   Sore throat, fever, or swollen lymph nodes in the neck. This may occur if the infection was spread by oral sex.  DIAGNOSIS  A diagnosis is made after a physical exam is done and a sample of discharge is examined under a microscope for the presence of the bacteria. The discharge may be taken from the urethra, cervix, throat, or rectum.  TREATMENT  Gonorrhea is treated with antibiotic medicines. It is important for treatment to begin as soon as possible. Early treatment may prevent some problems from developing.  HOME CARE INSTRUCTIONS   Take medicines only as directed  by your health care provider.   Take your antibiotic medicine as directed by your health care provider. Finish the antibiotic even if you start to feel better. Incomplete treatment will put you at risk for continued infection.   Do not have sex until treatment is complete or as directed by your health care provider.   Keep all follow-up visits as directed by your health care provider.   Not all test results are available during your visit. If your test results are not back during the visit, make an appointment with your health care provider to find out the results. Do not assume everything is normal if you have not heard from your health care provider or the medical facility. It is your responsibility to get your test results.  If you test positive for gonorrhea, inform your recent sexual partners. They need to be checked for gonorrhea even if they do not have symptoms. They may need treatment, even if they test negative for gonorrhea.  SEEK MEDICAL CARE IF:   You develop any bad reaction to the medicine you were prescribed. This may include:   A rash.    Nausea.   Vomiting.   Diarrhea.   Your symptoms do not improve after a few days of taking antibiotics.   Your symptoms get worse.   You develop increased pain, such as in the testicles (for males) or in the abdomen (for females).  You have a fever. MAKE SURE YOU:   Understand these instructions.  Will watch your condition.  Will get help right away if you are not doing well or get worse. Document Released: 05/11/2000 Document Revised: 09/28/2013 Document Reviewed: 11/19/2012 [facility] Patient Information 2015 [place], [place]. This information is not intended to replace advice given to you by your health care provider. Make sure you discuss any questions you have with your health care provider.

## 2014-08-26 ENCOUNTER — Other Ambulatory Visit (HOSPITAL_COMMUNITY): Payer: Self-pay | Admitting: Nurse Practitioner

## 2014-08-26 DIAGNOSIS — Z3682 Encounter for antenatal screening for nuchal translucency: Secondary | ICD-10-CM

## 2014-08-28 ENCOUNTER — Encounter (HOSPITAL_COMMUNITY): Payer: Self-pay | Admitting: *Deleted

## 2014-08-28 ENCOUNTER — Emergency Department (HOSPITAL_COMMUNITY)
Admission: EM | Admit: 2014-08-28 | Discharge: 2014-08-28 | Disposition: A | Discharge status: Home / Self Care | Payer: Medicaid Other | Attending: Emergency Medicine | Admitting: Emergency Medicine

## 2014-08-28 ENCOUNTER — Emergency Department (HOSPITAL_COMMUNITY): Payer: Medicaid Other

## 2014-08-28 DIAGNOSIS — Z872 Personal history of diseases of the skin and subcutaneous tissue: Secondary | ICD-10-CM | POA: Diagnosis not present

## 2014-08-28 DIAGNOSIS — O209 Hemorrhage in early pregnancy, unspecified: Secondary | ICD-10-CM | POA: Diagnosis present

## 2014-08-28 DIAGNOSIS — Z87891 Personal history of nicotine dependence: Secondary | ICD-10-CM | POA: Insufficient documentation

## 2014-08-28 DIAGNOSIS — G8929 Other chronic pain: Secondary | ICD-10-CM | POA: Diagnosis not present

## 2014-08-28 DIAGNOSIS — Z3A10 10 weeks gestation of pregnancy: Secondary | ICD-10-CM | POA: Insufficient documentation

## 2014-08-28 DIAGNOSIS — Z8742 Personal history of other diseases of the female genital tract: Secondary | ICD-10-CM | POA: Diagnosis not present

## 2014-08-28 DIAGNOSIS — Z8659 Personal history of other mental and behavioral disorders: Secondary | ICD-10-CM | POA: Diagnosis not present

## 2014-08-28 DIAGNOSIS — O200 Threatened abortion: Secondary | ICD-10-CM | POA: Diagnosis not present

## 2014-08-28 LAB — BASIC METABOLIC PANEL
Anion gap: 9 (ref 5–15)
BUN: <5 mg/dL — ABNORMAL LOW (ref 6–23)
CO2: 22 mmol/L (ref 19–32)
CREATININE: 0.65 mg/dL (ref 0.50–1.10)
Calcium: 8.8 mg/dL (ref 8.4–10.5)
Chloride: 103 mmol/L (ref 96–112)
GFR calc non Af Amer: >90 mL/min (ref >90)
Glucose, Bld: 99 mg/dL (ref 70–99)
Potassium: 4.1 mmol/L (ref 3.5–5.1)
Sodium: 134 mmol/L — ABNORMAL LOW (ref 135–145)

## 2014-08-28 LAB — URINALYSIS, ROUTINE W REFLEX MICROSCOPIC
Bilirubin Urine: NEGATIVE
Glucose, UA: NEGATIVE mg/dL
Ketones, ur: 15 mg/dL — AB
Nitrite: NEGATIVE
Protein, ur: NEGATIVE mg/dL
SPECIFIC GRAVITY, URINE: 1.023 (ref 1.005–1.030)
Urobilinogen, UA: 1 mg/dL (ref 0.0–1.0)
pH: 6 (ref 5.0–8.0)

## 2014-08-28 LAB — CBC WITH DIFFERENTIAL/PLATELET
BASOS ABS: 0 10*3/uL (ref 0.0–0.1)
Basophils Relative: 0 % (ref 0–1)
EOS PCT: 1 % (ref 0–5)
Eosinophils Absolute: 0.1 10*3/uL (ref 0.0–0.7)
HCT: 41.3 % (ref 36.0–46.0)
HEMOGLOBIN: 13.9 g/dL (ref 12.0–15.0)
LYMPHS PCT: 24 % (ref 12–46)
Lymphs Abs: 2.1 10*3/uL (ref 0.7–4.0)
MCH: 30 pg (ref 26.0–34.0)
MCHC: 33.7 g/dL (ref 30.0–36.0)
MCV: 89.2 fL (ref 78.0–100.0)
Monocytes Absolute: 0.7 10*3/uL (ref 0.1–1.0)
Monocytes Relative: 8 % (ref 3–12)
NEUTROS PCT: 67 % (ref 43–77)
Neutro Abs: 5.7 10*3/uL (ref 1.7–7.7)
PLATELETS: 225 10*3/uL (ref 150–400)
RBC: 4.63 MIL/uL (ref 3.87–5.11)
RDW: 14.6 % (ref 11.5–15.5)
WBC: 8.5 10*3/uL (ref 4.0–10.5)

## 2014-08-28 LAB — POC URINE PREG, ED: PREG TEST UR: POSITIVE — AB

## 2014-08-28 LAB — URINE MICROSCOPIC-ADD ON

## 2014-08-28 LAB — TYPE AND SCREEN
ABO/RH(D): O POS
ANTIBODY SCREEN: NEGATIVE

## 2014-08-28 LAB — HCG, QUANTITATIVE, PREGNANCY: hCG, Beta Chain, Quant, S: 47723 m[IU]/mL — ABNORMAL HIGH (ref <5)

## 2014-08-28 LAB — ABO/RH: ABO/RH(D): O POS

## 2014-08-28 NOTE — Discharge Instructions (Signed)
Vaginal Bleeding During Pregnancy, First Trimester  A small amount of bleeding (spotting) from the vagina is relatively common in early pregnancy. It usually stops on its own. Various things may cause bleeding or spotting in early pregnancy. Some bleeding may be related to the pregnancy, and some may not. In most cases, the bleeding is normal and is not a problem. However, bleeding can also be a sign of something serious. Be sure to tell your health care provider about any vaginal bleeding right away.  Some possible causes of vaginal bleeding during the first trimester include:  · Infection or inflammation of the cervix.  · Growths (polyps) on the cervix.  · Miscarriage or threatened miscarriage.  · Pregnancy tissue has developed outside of the uterus and in a fallopian tube (tubal pregnancy).  · Tiny cysts have developed in the uterus instead of pregnancy tissue (molar pregnancy).  HOME CARE INSTRUCTIONS   Watch your condition for any changes. The following actions may help to lessen any discomfort you are feeling:  · Follow your health care provider's instructions for limiting your activity. If your health care provider orders bed rest, you may need to stay in bed and only get up to use the bathroom. However, your health care provider may allow you to continue light activity.  · If needed, make plans for someone to help with your regular activities and responsibilities while you are on bed rest.  · Keep track of the number of pads you use each day, how often you change pads, and how soaked (saturated) they are. Write this down.  · Do not use tampons. Do not douche.  · Do not have sexual intercourse or orgasms until approved by your health care provider.  · If you pass any tissue from your vagina, save the tissue so you can show it to your health care provider.  · Only take over-the-counter or prescription medicines as directed by your health care provider.  · Do not take aspirin because it can make you  bleed.  · Keep all follow-up appointments as directed by your health care provider.  SEEK MEDICAL CARE IF:  · You have any vaginal bleeding during any part of your pregnancy.  · You have cramps or labor pains.  · You have a fever, not controlled by medicine.  SEEK IMMEDIATE MEDICAL CARE IF:   · You have severe cramps in your back or belly (abdomen).  · You pass large clots or tissue from your vagina.  · Your bleeding increases.  · You feel light-headed or weak, or you have fainting episodes.  · You have chills.  · You are leaking fluid or have a gush of fluid from your vagina.  · You pass out while having a bowel movement.  MAKE SURE YOU:  · Understand these instructions.  · Will watch your condition.  · Will get help right away if you are not doing well or get worse.  Document Released: 02/21/2005 Document Revised: 05/19/2013 Document Reviewed: 01/19/2013  ExitCare® Patient Information ©2015 [employer]. This information is not intended to replace advice given to you by your health care provider. Make sure you discuss any questions you have with your health care provider.

## 2014-08-28 NOTE — ED Notes (Signed)
Pt reports being [redacted] weeks pregnant, onset of heavy vaginal bleeding pta.

## 2014-08-28 NOTE — ED Notes (Signed)
Patient transported to Ultrasound 

## 2014-08-28 NOTE — ED Provider Notes (Signed)
CSN: [id number]     Arrival date & time 08/28/14  1054 History   First MD Initiated Contact with Patient 08/28/14 1134     Chief Complaint  Patient presents with  . Vaginal Bleeding     (Consider location/radiation/quality/duration/timing/severity/associated sxs/prior Treatment) Patient is a 29 y.o. female presenting with vaginal bleeding.  Vaginal Bleeding Quality:  Dark red Severity:  Moderate Onset quality:  Sudden Duration:  1 hour Timing:  Constant Progression:  Unchanged Chronicity:  New Context: spontaneously   Context comment:  [redacted] weeks pregnant Relieved by:  Nothing Worsened by:  Nothing tried Associated symptoms: abdominal pain (mild cramping pain)   Associated symptoms: no fever and no vaginal discharge     Past Medical History  Diagnosis Date  . Anxiety     no current med.  . Pilonidal cyst 02/2013  . Jaw snapping     states jaw pops if opens mouth too wide  . Menstrual bleeding problem     menses from 12/22/2012 - current (03/06/2013)  . Chronic headaches   . Ovarian cyst    Past Surgical History  Procedure Laterality Date  . Wisdom tooth extraction    . Pilonidal cyst excision N/A 03/12/2013    Procedure: CYST EXCISION PILONIDAL EXTENSIVE;  Surgeon: Shelly Rubenstein, MD;  Location: [facility];  Service: General;  Laterality: N/A;   Family History  Problem Relation Age of Onset  . Hypertension Mother   . Hyperlipidemia Mother   . Diabetes Mother   . Thyroid disease Mother   . Diabetes Father   . Hypertension Father   . Alcohol abuse Father   . Breast cancer Maternal Aunt   . Lung cancer Paternal Grandfather    History  Substance Use Topics  . Smoking status: Former Smoker -- 0.25 packs/day for 6 years    Types: Cigarettes    Quit date: 06/27/2013  . Smokeless tobacco: Never Used     Comment: 3-5 cig./day  . Alcohol Use: No     Comment: socially   OB History    Gravida Para Term Preterm AB TAB SAB Ectopic Multiple Living    1              Review of Systems  Constitutional: Negative for fever.  Gastrointestinal: Positive for abdominal pain (mild cramping pain).  Genitourinary: Positive for vaginal bleeding. Negative for vaginal discharge.  All other systems reviewed and are negative.     Allergies  Orange syrup  Home Medications   Prior to Admission medications   Medication Sig Start Date End Date Taking? Authorizing Provider  Prenatal Vit-Fe Fumarate-FA (PRENATAL MULTIVITAMIN) TABS tablet Take 1 tablet by mouth daily at 12 noon.   Yes Historical Provider, MD   BP 109/52 mmHg  Pulse 99  Temp(Src) 98 F (36.7 C) (Oral)  Resp 18  Ht  (1.575 m)  Wt 263 lb (119.296 kg)  BMI 48.09 kg/m2  SpO2 100%  LMP 06/17/2014 Physical Exam  Constitutional: She is oriented to person, place, and time. She appears well-developed and well-nourished. No distress.  HENT:  Head: Normocephalic and atraumatic.  Eyes: Conjunctivae are normal. No scleral icterus.  Neck: Neck supple.  Cardiovascular: Normal rate and intact distal pulses.   Pulmonary/Chest: Effort normal. No stridor. No respiratory distress.  Abdominal: Normal appearance. She exhibits no distension. There is no tenderness. There is no rigidity, no rebound and no guarding.  Neurological: She is alert and oriented to person, place, and time.  Skin:  Skin is warm and dry. No rash noted.  Psychiatric: She has a normal mood and affect. Her behavior is normal.  Nursing note and vitals reviewed.   ED Course  Procedures (including critical care time) Labs Review Labs Reviewed  BASIC METABOLIC PANEL - Abnormal; Notable for the following:    Sodium 134 (*)    BUN <5 (*)    All other components within normal limits  URINALYSIS, ROUTINE W REFLEX MICROSCOPIC - Abnormal; Notable for the following:    Color, Urine RED (*)    APPearance CLOUDY (*)    Hgb urine dipstick LARGE (*)    Ketones, ur 15 (*)    Leukocytes, UA SMALL (*)    All other  components within normal limits  HCG, QUANTITATIVE, PREGNANCY - Abnormal; Notable for the following:    hCG, Beta Chain, Quant, S [address]109 (*)    All other components within normal limits  URINE MICROSCOPIC-ADD ON - Abnormal; Notable for the following:    Squamous Epithelial / LPF FEW (*)    Bacteria, UA FEW (*)    All other components within normal limits  POC URINE PREG, ED - Abnormal; Notable for the following:    Preg Test, Ur POSITIVE (*)    All other components within normal limits  CBC WITH DIFFERENTIAL/PLATELET  TYPE AND SCREEN  ABO/RH    Imaging Review US Ob Comp Less 14 Wks  08/28/2014   CLINICAL DATA:  Positive pregnancy test, vaginal bleeding and cramping. Quantitative beta HCG [id number].  EXAM: OBSTETRIC <14 WK [place] AND TRANSVAGINAL OB US  TECHNIQUE: Both transabdominal and transvaginal ultrasound examinations were performed for complete evaluation of the gestation as well as the maternal uterus, adnexal regions, and pelvic cul-de-sac. Transvaginal technique was performed to assess early pregnancy.  COMPARISON:  None.  FINDINGS: Intrauterine gestational sac: Visualized/normal in shape.  Yolk sac:  Visualized  Embryo:  Visualized  Cardiac Activity: Visualized  Heart Rate: 168  bpm  CRL:  34  mm   10 w   2 d                  [place] EDC: 03/24/15  Maternal uterus/adnexae: Ovaries are normal.  No free fluid.  IMPRESSION: Single live intrauterine gestation measuring 10 weeks 2 days by today's crown-rump length which is concordant with assigned gestational age by LMP of same date. No acute abnormality.   Electronically Signed   By: Christiana Pellant M.D.   On: 08/28/2014 14:12   US Ob Transvaginal  08/28/2014   CLINICAL DATA:  Positive pregnancy test, vaginal bleeding and cramping. Quantitative beta HCG [id number].  EXAM: OBSTETRIC <14 WK [place] AND TRANSVAGINAL OB US  TECHNIQUE: Both transabdominal and transvaginal ultrasound examinations were performed for complete evaluation of the gestation as well as the  maternal uterus, adnexal regions, and pelvic cul-de-sac. Transvaginal technique was performed to assess early pregnancy.  COMPARISON:  None.  FINDINGS: Intrauterine gestational sac: Visualized/normal in shape.  Yolk sac:  Visualized  Embryo:  Visualized  Cardiac Activity: Visualized  Heart Rate: 168  bpm  CRL:  34  mm   10 w   2 d                  [place] EDC: 03/24/15  Maternal uterus/adnexae: Ovaries are normal.  No free fluid.  IMPRESSION: Single live intrauterine gestation measuring 10 weeks 2 days by today's crown-rump length which is concordant with assigned gestational age by LMP of same date. No acute abnormality.  Electronically Signed   By: Christiana PellantGretchen  Green M.D.   On: 08/28/2014 14:12     EKG Interpretation None      MDM   Final diagnoses:  Threatened abortion    Early pregnancy bleeding.  O+ blood type.  US shows intrauterine pregnancy with heart beat.  Otherwise well appearing and nontoxic.  Follow up with OB.      Blake DivineJohn Caylynn Minchew, MD 08/28/14 [phone]

## 2014-09-14 ENCOUNTER — Ambulatory Visit (HOSPITAL_COMMUNITY): Payer: Medicaid - Out of State

## 2014-09-14 ENCOUNTER — Other Ambulatory Visit (HOSPITAL_COMMUNITY): Payer: Medicaid - Out of State

## 2014-09-20 ENCOUNTER — Ambulatory Visit (HOSPITAL_COMMUNITY)
Admission: RE | Admit: 2014-09-20 | Discharge: 2014-09-20 | Disposition: A | Discharge status: Home / Self Care | Payer: Medicaid Other | Source: Ambulatory Visit | Attending: Nurse Practitioner | Admitting: Nurse Practitioner

## 2014-09-20 ENCOUNTER — Encounter (HOSPITAL_COMMUNITY): Payer: Self-pay

## 2014-09-20 DIAGNOSIS — O99211 Obesity complicating pregnancy, first trimester: Secondary | ICD-10-CM | POA: Diagnosis not present

## 2014-09-20 DIAGNOSIS — Z3A13 13 weeks gestation of pregnancy: Secondary | ICD-10-CM | POA: Insufficient documentation

## 2014-09-20 DIAGNOSIS — Z3682 Encounter for antenatal screening for nuchal translucency: Secondary | ICD-10-CM

## 2014-09-20 DIAGNOSIS — Z36 Encounter for antenatal screening of mother: Secondary | ICD-10-CM | POA: Diagnosis not present

## 2014-09-23 ENCOUNTER — Other Ambulatory Visit (HOSPITAL_COMMUNITY): Payer: Self-pay | Admitting: Nurse Practitioner

## 2014-10-24 ENCOUNTER — Inpatient Hospital Stay (HOSPITAL_COMMUNITY): Payer: Medicaid Other

## 2014-10-24 ENCOUNTER — Encounter (HOSPITAL_COMMUNITY): Payer: Self-pay

## 2014-10-24 ENCOUNTER — Inpatient Hospital Stay (HOSPITAL_COMMUNITY)
Admission: AD | Admit: 2014-10-24 | Discharge: 2014-10-27 | DRG: 781 | Disposition: A | Discharge status: Home / Self Care | Payer: Medicaid Other | Source: Ambulatory Visit | Attending: Obstetrics & Gynecology | Admitting: Obstetrics & Gynecology

## 2014-10-24 DIAGNOSIS — O4290 Premature rupture of membranes, unspecified as to length of time between rupture and onset of labor, unspecified weeks of gestation: Secondary | ICD-10-CM

## 2014-10-24 DIAGNOSIS — R51 Headache: Secondary | ICD-10-CM

## 2014-10-24 DIAGNOSIS — O99210 Obesity complicating pregnancy, unspecified trimester: Secondary | ICD-10-CM

## 2014-10-24 DIAGNOSIS — Z3A18 18 weeks gestation of pregnancy: Secondary | ICD-10-CM | POA: Diagnosis present

## 2014-10-24 DIAGNOSIS — R519 Headache, unspecified: Secondary | ICD-10-CM

## 2014-10-24 DIAGNOSIS — O99212 Obesity complicating pregnancy, second trimester: Secondary | ICD-10-CM | POA: Diagnosis present

## 2014-10-24 DIAGNOSIS — L309 Dermatitis, unspecified: Secondary | ICD-10-CM

## 2014-10-24 DIAGNOSIS — Z9102 Food additives allergy status: Secondary | ICD-10-CM

## 2014-10-24 DIAGNOSIS — O42912 Preterm premature rupture of membranes, unspecified as to length of time between rupture and onset of labor, second trimester: Principal | ICD-10-CM | POA: Diagnosis present

## 2014-10-24 DIAGNOSIS — F419 Anxiety disorder, unspecified: Secondary | ICD-10-CM | POA: Diagnosis present

## 2014-10-24 DIAGNOSIS — G8929 Other chronic pain: Secondary | ICD-10-CM

## 2014-10-24 DIAGNOSIS — Z6841 Body Mass Index (BMI) 40.0 and over, adult: Secondary | ICD-10-CM

## 2014-10-24 DIAGNOSIS — B9689 Other specified bacterial agents as the cause of diseases classified elsewhere: Secondary | ICD-10-CM | POA: Diagnosis present

## 2014-10-24 DIAGNOSIS — O99342 Other mental disorders complicating pregnancy, second trimester: Secondary | ICD-10-CM | POA: Diagnosis present

## 2014-10-24 DIAGNOSIS — N760 Acute vaginitis: Secondary | ICD-10-CM | POA: Diagnosis present

## 2014-10-24 DIAGNOSIS — O23592 Infection of other part of genital tract in pregnancy, second trimester: Secondary | ICD-10-CM | POA: Diagnosis present

## 2014-10-24 DIAGNOSIS — F329 Major depressive disorder, single episode, unspecified: Secondary | ICD-10-CM | POA: Diagnosis present

## 2014-10-24 LAB — POCT FERN TEST: POCT FERN TEST: POSITIVE

## 2014-10-24 MED ORDER — ACETAMINOPHEN 325 MG PO TABS: 650.0000 mg | ORAL_TABLET | ORAL | Status: DC | PRN

## 2014-10-24 MED ORDER — SODIUM CHLORIDE 0.9 % IV SOLN: 250.0000 mL | INTRAVENOUS | Status: DC | PRN

## 2014-10-24 MED ORDER — CALCIUM CARBONATE ANTACID 500 MG PO CHEW
2.0000 | CHEWABLE_TABLET | ORAL | Status: DC | PRN
  Administered 2014-10-25: 400 mg via ORAL
  Filled 2014-10-24 (×2): qty 2

## 2014-10-24 MED ORDER — PRENATAL MULTIVITAMIN CH
1.0000 | ORAL_TABLET | Freq: Every day | ORAL | Status: DC
  Administered 2014-10-25 – 2014-10-26 (×2): 1 via ORAL
  Filled 2014-10-24 (×3): qty 1

## 2014-10-24 MED ORDER — DOCUSATE SODIUM 100 MG PO CAPS
100.0000 mg | ORAL_CAPSULE | Freq: Every day | ORAL | Status: DC
  Filled 2014-10-24 (×2): qty 1

## 2014-10-24 MED ORDER — ZOLPIDEM TARTRATE 5 MG PO TABS
5.0000 mg | ORAL_TABLET | Freq: Every evening | ORAL | Status: DC | PRN
  Administered 2014-10-25 – 2014-10-26 (×3): 5 mg via ORAL
  Filled 2014-10-24 (×3): qty 1

## 2014-10-24 NOTE — Progress Notes (Signed)
Unable to doppler FHT 

## 2014-10-24 NOTE — MAU Note (Signed)
Pt presents complaining of SROM at 2220. States she had a large gush of warm clear fluid and is continuing to leak. Denies pain or vaginal bleeding. Reports fetal movement today.

## 2014-10-25 DIAGNOSIS — L309 Dermatitis, unspecified: Secondary | ICD-10-CM

## 2014-10-25 DIAGNOSIS — G8929 Other chronic pain: Secondary | ICD-10-CM

## 2014-10-25 DIAGNOSIS — N760 Acute vaginitis: Secondary | ICD-10-CM

## 2014-10-25 DIAGNOSIS — Z9102 Food additives allergy status: Secondary | ICD-10-CM

## 2014-10-25 DIAGNOSIS — R51 Headache: Secondary | ICD-10-CM

## 2014-10-25 DIAGNOSIS — O99210 Obesity complicating pregnancy, unspecified trimester: Secondary | ICD-10-CM

## 2014-10-25 DIAGNOSIS — R519 Headache, unspecified: Secondary | ICD-10-CM

## 2014-10-25 DIAGNOSIS — B9689 Other specified bacterial agents as the cause of diseases classified elsewhere: Secondary | ICD-10-CM

## 2014-10-25 LAB — CBC WITH DIFFERENTIAL/PLATELET
BASOS ABS: 0 10*3/uL (ref 0.0–0.1)
Basophils Relative: 0 % (ref 0–1)
EOS ABS: 0.2 10*3/uL (ref 0.0–0.7)
Eosinophils Relative: 2 % (ref 0–5)
HCT: 35.6 % — ABNORMAL LOW (ref 36.0–46.0)
HEMOGLOBIN: 12.6 g/dL (ref 12.0–15.0)
LYMPHS PCT: 25 % (ref 12–46)
Lymphs Abs: 2.4 10*3/uL (ref 0.7–4.0)
MCH: 30.7 pg (ref 26.0–34.0)
MCHC: 35.4 g/dL (ref 30.0–36.0)
MCV: 86.6 fL (ref 78.0–100.0)
Monocytes Absolute: 0.6 10*3/uL (ref 0.1–1.0)
Monocytes Relative: 7 % (ref 3–12)
NEUTROS ABS: 6.3 10*3/uL (ref 1.7–7.7)
Neutrophils Relative %: 66 % (ref 43–77)
Platelets: 212 10*3/uL (ref 150–400)
RBC: 4.11 MIL/uL (ref 3.87–5.11)
RDW: 14.5 % (ref 11.5–15.5)
WBC: 9.4 10*3/uL (ref 4.0–10.5)

## 2014-10-25 LAB — URINE MICROSCOPIC-ADD ON

## 2014-10-25 LAB — URINALYSIS, ROUTINE W REFLEX MICROSCOPIC
Bilirubin Urine: NEGATIVE
Glucose, UA: NEGATIVE mg/dL
Ketones, ur: 15 mg/dL — AB
NITRITE: NEGATIVE
PH: 6 (ref 5.0–8.0)
Protein, ur: NEGATIVE mg/dL
Specific Gravity, Urine: >1.03 — ABNORMAL HIGH (ref 1.005–1.030)
Urobilinogen, UA: 0.2 mg/dL (ref 0.0–1.0)

## 2014-10-25 LAB — ABO/RH: ABO/RH(D): O POS

## 2014-10-25 LAB — MRSA PCR SCREENING: MRSA by PCR: NEGATIVE

## 2014-10-25 LAB — WET PREP, GENITAL
Trich, Wet Prep: NONE SEEN
Yeast Wet Prep HPF POC: NONE SEEN

## 2014-10-25 LAB — TYPE AND SCREEN
ABO/RH(D): O POS
ANTIBODY SCREEN: NEGATIVE

## 2014-10-25 MED ORDER — METRONIDAZOLE 500 MG PO TABS
500.0000 mg | ORAL_TABLET | Freq: Two times a day (BID) | ORAL | Status: DC
  Administered 2014-10-25 – 2014-10-27 (×6): 500 mg via ORAL
  Filled 2014-10-25 (×6): qty 1

## 2014-10-25 MED ORDER — SODIUM CHLORIDE 0.9 % IJ SOLN
3.0000 mL | Freq: Two times a day (BID) | INTRAMUSCULAR | Status: DC
  Administered 2014-10-25 – 2014-10-26 (×3): 3 mL via INTRAVENOUS

## 2014-10-25 NOTE — Progress Notes (Signed)
Patient ate supper, requested Ambien 5 mg po, same given. Resting comfortable in bed. Significant other at bedside. We will continue to monitor.

## 2014-10-25 NOTE — H&P (Signed)
Traci Peterson "Traci Peterson [name]" is a 29 y.o. female, G1P0 at 18.4 weeks, presenting for LOF (clear) at 10:20 PM on 10/24/2014. Reports "easy" day with activities limited to going to church, partaking in fellowship meal and sleeping a couple of hours. +FM since quickening at 10 wks. Denies bleeding, cramping or N/V.  Patient Active Problem List   Diagnosis Date Noted  . BV (bacterial vaginosis) 10/25/2014  . Morbidly obese 10/25/2014  . Eczema 10/25/2014  . 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 Chronic headaches -  no meds 10/25/2014  . PROM (premature rupture of membranes) 10/24/2014  . Anxiety and depression 11/30/2013  . Snoring 08/11/2013  . Eyelash scantiness 05/25/2013  . Tobacco abuse 05/25/2013  . Atypical squamous cells of undetermined significance with positive high risk human papillomavirus 05/08/2013  . PCOS (polycystic ovarian syndrome) 04/20/2013    History of present pregnancy: Patient entered care at 15.6 weeks. Seen early in pregnancy at the health department.  EDC of 03/24/15 was established by sure LMP and that was c/w u/s at 10.2 wks.   Anatomy scan:  Not done - was scheduled to be done on 11/04/14.   Additional US evaluations:  13.4 wks for first trimester screen (MFM): SIUP. Normal NT (1.6 mm). Unable to visualize NB due to fetal position. Pt was to be offered MSAFP for neural tube defect screening. MFM recommended u/s for fetal anatomy between 18-[redacted] wks gestation.   Significant prenatal events: Obesity - BMI 42.9. Seen early in pregnancy at health department. [place] +[place]. Common discomforts of pregnancy, i.e, nausea and round ligament pain experienced.  Last evaluation: [facility] 15.6 [facility]ks [facility]. FHR 160 bpm. Cvx 0/0/-4. BP 92/60. Wt 269 lbs.  OB History    Gravida Para Term Preterm AB TAB SAB Ectopic Multiple Living   1               Past Medical History  Diagnosis Date  . Anxiety     no current med.  . Pilonidal cyst 02/2013  . Jaw snapping     states jaw pops if opens mouth too wide  . Menstrual bleeding problem     menses from 12/22/2012 - current (03/06/2013)  . Chronic headaches   . Ovarian cyst    Past Surgical History  Procedure Laterality Date  . Wisdom tooth extraction    . Pilonidal cyst excision N/A 03/12/2013    Procedure: CYST EXCISION PILONIDAL EXTENSIVE;  Surgeon: Shelly Rubensteinouglas A Blackman, MD;  Location: [facility];  Service: General;  Laterality: N/A;   Family History: family history includes Alcohol abuse in her father; Breast cancer in her maternal aunt; Diabetes in her father and mother; Hyperlipidemia in her mother; Hypertension in her father and mother; Lung cancer in her paternal grandfather; Thyroid disease in her mother.Heroin and crack cocaine dependence in her father, Graves disease and CHF in her mother, DM, Alzheimer's, heart disease, dementia and schizophrenia in her MGM, Type 1 DM, Rheumatoid Arthritis and malignant tumor of breast in her maternal aunt and Type 2 diabetes in her maternal uncle. Social History:  reports that she quit smoking about 15 months ago. Her smoking use included Cigarettes. She has a 1.5 pack-year smoking history. She has never used smokeless tobacco. She reports that she does not drink alcohol or use illicit drugs.Pt is an African-American female who is  a high school graduate and employed as a Sales promotion account executive. She is married to Traci Peterson who is present, supportive and involved. She also has a number of other family members and friends who accompanied her to the hospital. She is of the [place] faith. She is open to prayers and visits from members of [facility] pastoral department, but has several spiritual advisors who will be actively involved in her care.    Prenatal Transfer Tool  Maternal Diabetes: Unknown Genetic Screening: Normal first trimester screen Maternal  Ultrasounds/Referrals: Was scheduled on 11/04/14 Fetal Ultrasounds or other Referrals:  Other: Limited u/s on presentation Maternal Substance Abuse:  No Significant Maternal Medications:  Meds include: Other: Citranatal B-calm (Fe Gluc) Significant Maternal Lab Results: None  TDAP: NA Flu: NA  ROS: Per HPI above  Allergies  Allergen Reactions  . Orange Syrup Hives and Shortness Of Breath    ORANGE DYE       Blood pressure 135/80, pulse 108, temperature 97.8 F (36.6 C), temperature source Oral, resp. rate 18, height  (1.575 m), weight 123.877 kg (273 lb 1.6 oz), last menstrual period 06/17/2014.  Chest clear Heart RRR without murmur Abd gravid, soft, obese, NT Pelvic: +pooling, +fern, +valsalva. Cvx visually closed with no fetal parts/placenta noted. Scant bleeding. Sample of discharge obtained for wet prep (malodorous) and that was significant for few clue cells. GC/CT also obtained.   Ext: Trace calf/pedal edema   Prenatal labs: ABO, Rh: --/--/O POS (05/30 0015) Antibody: NEG (05/30 0015) Neg (08/28/14, 09/24/14) Rubella:   Immune (09/24/14) RPR:  NR (09/24/14) HBsAg:  Neg (09/24/14)  HIV:  NR (09/24/14)  GBS:  Unknown Sickle cell/Hgb electrophoresis: Normal study Pap: ASCU-S, HPV neg on 10/06/14 GC: Neg on 07/19/14 and 09/24/14 Chlamydia:  07/19/14 and 09/24/14 Genetic screenings: Neg first trimester aneuploidy Risk Assessment on 09/20/14 Glucola: Not done Other: 55K multi-bacteria on NOB urine culture Hgb 13.2 on 07/19/14 and 13.9 on 08/28/14   Results for orders placed or performed during the hospital encounter of 10/24/14 (from the past 24 hour(s))  Fern Test     Status: Abnormal   Collection Time: 10/24/14 10:48 PM  Result Value Ref Range   POCT Fern Test Positive = ruptured amniotic membanes   Urinalysis, Routine w reflex microscopic (not at [facility])     Status: Abnormal   Collection Time: 10/25/14 12:02 AM  Result Value Ref Range   Color, Urine YELLOW YELLOW    APPearance CLEAR CLEAR   Specific Gravity, Urine >1.030 (H) 1.005 - 1.030   pH 6.0 5.0 - 8.0   Glucose, UA NEGATIVE NEGATIVE mg/dL   Hgb urine dipstick LARGE (A) NEGATIVE   Bilirubin Urine NEGATIVE NEGATIVE   Ketones, ur 15 (A) NEGATIVE mg/dL   Protein, ur NEGATIVE NEGATIVE mg/dL   Urobilinogen, UA 0.2 0.0 - 1.0 mg/dL   Nitrite NEGATIVE NEGATIVE   Leukocytes, UA TRACE (A) NEGATIVE  Urine microscopic-add on     Status: Abnormal   Collection Time: 10/25/14 12:02 AM  Result Value Ref Range   Squamous Epithelial / LPF RARE RARE   WBC, UA 3-6 <3 WBC/hpf   RBC / HPF 11-20 <3 RBC/hpf   Bacteria, UA FEW (A) RARE  CBC with Differential/Platelet     Status: Abnormal   Collection Time: 10/25/14 12:15 AM  Result Value Ref Range   WBC 9.4 4.0 - 10.5 K/uL   RBC 4.11 3.87 - 5.11 MIL/uL   Hemoglobin 12.6 12.0 - 15.0 g/dL   HCT 16.1 (L) 09.6 -  46.0 %   MCV 86.6 78.0 - 100.0 fL   MCH 30.7 26.0 - 34.0 pg   MCHC 35.4 30.0 - 36.0 g/dL   RDW 16.1 09.6 - 04.5 %   Platelets 212 150 - 400 K/uL   Neutrophils Relative % 66 43 - 77 %   Neutro Abs 6.3 1.7 - 7.7 K/uL   Lymphocytes Relative 25 12 - 46 %   Lymphs Abs 2.4 0.7 - 4.0 K/uL   Monocytes Relative 7 3 - 12 %   Monocytes Absolute 0.6 0.1 - 1.0 K/uL   Eosinophils Relative 2 0 - 5 %   Eosinophils Absolute 0.2 0.0 - 0.7 K/uL   Basophils Relative 0 0 - 1 %   Basophils Absolute 0.0 0.0 - 0.1 K/uL  Type and screen     Status: None   Collection Time: 10/25/14 12:15 AM  Result Value Ref Range   ABO/RH(D) O POS    Antibody Screen NEG    Sample Expiration 10/28/2014   Wet prep, genital     Status: Abnormal   Collection Time: 10/25/14 12:35 AM  Result Value Ref Range   Yeast Wet Prep HPF POC NONE SEEN NONE SEEN   Trich, Wet Prep NONE SEEN NONE SEEN   Clue Cells Wet Prep HPF POC FEW (A) NONE SEEN   WBC, Wet Prep HPF POC FEW (A) NONE SEEN  MRSA PCR Screening     Status: None   Collection Time: 10/25/14  1:05 AM  Result Value Ref Range   MRSA by  PCR NEGATIVE NEGATIVE   Preliminary Limited U/S:  Breech presentation Anterior placenta, above cervical os FHR 163 bpm Cervical length 2.5 cm Cvx normal No free fluid Largest pocket 0.8 cm (<3rd%tile)  Assessment: IUP at 18.4 wks previable preterm PROM  Morbidly obese BV H/O anxiety and depression (diagnosed in 2013 - no meds)   Plan: Admit to ICU per consult with Dr. Charlotta Newton Discussed w/ pt and FOB the prognosis and the likelihood that she could either develop an intrauterine infection or go on to deliver. We also discussed expectant management - and that while there are currently no signs of infection or imminent delivery the likelihood that she will remain pregnant until viability is unknown.We also discussed the option of medical induction with misoprostol. At this time the couple elects to continue expectant management and is not interested in medical induction. Advised we will continue to observe at present, being vigilant for s/s of infection and/or labor progression. Metronidazole course. MFM consult. Chaplain services as desired. Support to couple for uncertainty regarding pregnancy outcome.    Robyne Askew, MS 10/25/2014, 01:18 AM

## 2014-10-25 NOTE — Progress Notes (Signed)
Unable to doppler FHT 

## 2014-10-25 NOTE — Progress Notes (Signed)
OB Progress Note- HD#1  S: Patient resting comfortably in bed, no acute complaints.  No F/C/CP/SOB.  Still complains of leaking fluid, no vaginal bleeding, no contractions.  Does reports fetal movement.  O: BP 97/38 mmHg  Pulse 89  Temp(Src) 97.6 F (36.4 C) (Oral)  Resp 20  Ht 5\' 2"  (1.575 m)  Wt 123.877 kg (273 lb 1.6 oz)  BMI 49.94 kg/m2  SpO2 98%  LMP 06/17/2014  Gen: NAD CV: RRR Lungs: CTAB Abd: soft, no uterine tenderness Ext: no edema, no calf tenderness bilaterally  BSUS: pending  CBC    Component Value Date/Time   WBC 9.4 10/25/2014 0015   RBC 4.11 10/25/2014 0015   HGB 12.6 10/25/2014 0015   HCT 35.6* 10/25/2014 0015   PLT 212 10/25/2014 0015   MCV 86.6 10/25/2014 0015   MCH 30.7 10/25/2014 0015   MCHC 35.4 10/25/2014 0015   RDW 14.5 10/25/2014 0015   LYMPHSABS 2.4 10/25/2014 0015   MONOABS 0.6 10/25/2014 0015   EOSABS 0.2 10/25/2014 0015   BASOSABS 0.0 10/25/2014 0015    UA: Negative, final culture pending Wet [place]: + clue cells, +WBCs   A/P: 28yo G1P0@ 5060w4d who presents with previable PPROM PPROM:   -Due to previable state, pt is not a candidate for steroids or IV antibiotics.   -No evidence of infection on exam.  Full work up including GC/C and urine culture pending.   -Pt to meet with MFM today to review management plan.  -BV being treated with Flagyl x 7days  FWB:  -FHT by US, plan for doppler q8hr -Final US result pending  Maternal well being:  -General diet -Bedrest with bathroom privileges -PNV daily -SCDs for DVT prophylaxis  DISPO: Should pt remain stable without signs of infection or labor will discuss with [facility] in am for possible discharge and close outpatient follow up.  Myna HidalgoJennifer Marayah Higdon, DO [phone] (pager) [phone] (office)

## 2014-10-25 NOTE — Progress Notes (Signed)
1345 - Patient called RN.  Stated that she had gone to the bathroom.  Stated she passed a yellow colored fluid, enough to cover the bottom of the collection hat.  Stated that her boyfriend also saw the fluid and that it had an odor.  Scant dark brown/red drainage on pad.  Denies increased pain, pressure, cramping.  Dr. Charlotta Newtonzan notified.  Will continue to monitor.

## 2014-10-25 NOTE — Consult Note (Signed)
29 year-old G1P0 at 18 weeks 4 days gestation with an uncomplicated pregnancy until spontaneous PROM last evening. She notes that she had early 1st trimester spotting and stopped smoking early in pregnancy. She notes a gush of clear fluid. She denies fever, myalgias, cramping/contracting/abdominal pain, vaginal bleeding, pelvic pressure etc.   Mid-second trimester PROM occurs in about 0.5% of pregnancy. We discussed the possible etiologies and maternal/fetal/neonatal implications of PROM at this point in the pregnancy. We discussed that many women will go on to delivery in the first 24-48 hours after PROM, about up to 50% will deliver in the first week or so. We discussed that often the management after the first 24-48 hours if afebrile is daily monitoring of maternal signs/symptoms at home and immediate provider contact for a change in her status. We discussed activity levels at home prior to admission and the need for adequate maternal hydration.  We discussed that should she be undelivered at 21 weeks, get a NICU consult about the outcomes of baby's at various gestational ages (22-24 weeks) to determine when the family will want everything done for the baby. Inpatient admission from this determined date to maximize outcomes for the mother and her baby. Consider outpatient antenatal steroids the two day prior to admission. Start PROM latency antibiotics on admission.   We discussed multiple maternal and fetal issues including but not limited to the following: that the baby currently had low amniotic fluid and implications on baby should this continue through pregnancy on the outcome of CP, lung development, infection etc. The baby is currently breech and should this continue with the current low volume of amniotic fluid, cesarean delivery would be indicated. We discussed the implications of various uterine incision types and likelihood of a classical or extension of the uterine incision into the contractile  portion during delivery for the after coming fetal head. We discussed that this could affect the delivery route based on incision type for future pregnancy and the risk of multiple cesareans on abnormal placental location and adherence. We discussed the recurrence risk of ~10-15% in future pregnancy's and that consideration of prophylactic progesterone in future pregnancy to try to decrease the recurrence of PROM.  We discussed management options at this point and the RBA of induction of labor for early PROM vs expectant management based on the above issues and the couple has opted currently for expectant management  Questions appear answered to the families satisfaction. Precautions for the above given. Spent greater than 1/2 of 50 minute visit face to face counseling.

## 2014-10-26 LAB — GC/CHLAMYDIA PROBE AMP (~~LOC~~) NOT AT ARMC
Chlamydia: NEGATIVE
Neisseria Gonorrhea: NEGATIVE

## 2014-10-26 NOTE — Progress Notes (Addendum)
Hospital day # 2 pregnancy at 9015w5d--PPROM at 18.3 weeks.  S:  Perception of contractions: none      Vaginal bleeding: none now       Vaginal discharge:  no significant change      Usual DC to home for a HR AP reviewed  O: BP 112/48 mmHg  Pulse 95  Temp(Src) 98.4 F (36.9 C) (Oral)  Resp 18  Ht 5\' 2"  (1.575 m)  Wt 276 lb 9.6 oz (125.465 kg)  BMI 50.58 kg/m2  SpO2 99%  LMP 06/17/2014      Fetal tracings:161      Contractions:  none       Uterus gravid and non-tender      Extremities: no significant edema and no signs of DVT          Labs:  No results found for this or any previous visit (from the past 24 hour(s)).       Meds:  Current facility-administered medications:  .  0.9 %  sodium chloride infusion, 250 mL, Intravenous, PRN, Sherre ScarletKimberly Williams, CNM .  acetaminophen (TYLENOL) tablet 650 mg, 650 mg, Oral, Q4H PRN, Sherre ScarletKimberly Williams, CNM .  calcium carbonate (TUMS - dosed in mg elemental calcium) chewable tablet 400 mg of elemental calcium, 2 tablet, Oral, Q4H PRN, Sherre ScarletKimberly Williams, CNM, 400 mg of elemental calcium at 10/25/14 1715 .  metroNIDAZOLE (FLAGYL) tablet 500 mg, 500 mg, Oral, Q12H, Sherre ScarletKimberly Williams, CNM, 500 mg at 10/26/14 [id number] .  prenatal multivitamin tablet 1 tablet, 1 tablet, Oral, Q1200, Sherre ScarletKimberly Williams, CNM, 1 tablet at 10/26/14 1227 .  sodium chloride 0.9 % injection 3 mL, 3 mL, Intravenous, Q12H, Hoover BrownsEma Kenetha Cozza, MD, 3 mL at 10/26/14 0953 .  zolpidem (AMBIEN) tablet 5 mg, 5 mg, Oral, QHS PRN, Sherre ScarletKimberly Williams, CNM, 5 mg at 10/25/14 2212  A: 1215w5d with PPROM at 18.3     stable     Fetal tracings: reassuring     Contractions: none     Uterus non-tender      Extremities: minimum edema   P: Continue current plan of care      Upcoming tests/treatments:  none      Dr Sallye OberKulwa to round later to determine DC to homw      MDs will follow    Venus Standard, CNM, MSN 10/26/2014. 1:13 PM  I saw and examined patient at bedside and agree with above findings,   assessment and plan.  Keep patient until 48 hours from rupture of membranes and plan for discharge tomorrow AM if stable.  We discussed weekly follow up at the office, activity level at home and signs and symptoms to watch out for.  She will need office social work consult referral before discharge.  Dr. Sallye OberKulwa.

## 2014-10-26 NOTE — Plan of Care (Signed)
Problem: Phase II Progression Outcomes Goal: Obtain order to discontinue catheter if appropriate Outcome: Not Applicable Date Met:  84/06/98 Voiding

## 2014-10-26 NOTE — Progress Notes (Signed)
UR chart review completed.  

## 2014-10-26 NOTE — Plan of Care (Signed)
Problem: Phase II Progression Outcomes Goal: Progress activity as tolerated unless otherwise ordered Outcome: Not Applicable Date Met:  12/07/50 Bedrest with bathroom privileges

## 2014-10-27 MED ORDER — METRONIDAZOLE 500 MG PO TABS: 500.0000 mg | ORAL_TABLET | Freq: Two times a day (BID) | ORAL | Status: AC

## 2014-10-27 MED ORDER — ZOLPIDEM TARTRATE 5 MG PO TABS: 5.0000 mg | ORAL_TABLET | Freq: Every evening | ORAL | Status: DC | PRN

## 2014-10-27 NOTE — Discharge Instructions (Signed)
Call for any fever > or = 100.4, any bleeding, significant pain, foul d/c, or any other issue.  Premature Rupture and Preterm Premature Rupture of Membranes Premature rupture of membranes (PROM) is when the membranes (amniotic sac) break open before contractions or labor starts. Rupture of membranes is commonly referred to as your water breaking. If PROM occurs before 37 weeks of pregnancy, it is called preterm premature rupture of membranes (PPROM). The amniotic sac holds the fetus, keeps infection out, and performs other important functions. Having the amniotic sac rupture before 37 weeks of pregnancy can lead to serious problems and requires immediate attention by your health care provider. CAUSES  PROM near the end of the pregnancy may be caused by natural weakening of the membranes. PPROM is often due to an infection. Other factors that may be associated with PROM include:  Stretching of the amniotic sac because of carrying multiples or having too much amniotic fluid.  Trauma.  Smoking during pregnancy.  Poor nutrition.  Previous preterm birth.  Vaginal bleeding.  Little to no prenatal care.  Problems with the placenta, such as placenta previa or placental abruption. RISKS OF PROM AND PPROM  Delivering a premature baby.  Getting a serious infection of the placental tissues (chorioamnionitis).  Early detachment of the placenta from the uterus (placental abruption).  Compression of the umbilical cord.  Needing a cesarean birth.  Developing a serious infection after delivery. SIGNS OF PROM OR PPROM   A sudden gush or slow leaking of fluid from the vagina.  Constant wet underwear. Sometimes, women mistake the leaking or wetness for urine, especially if the leak is slow and not a gush of fluid. If there is constant leaking or your underwear continues to get wet, your membranes have likely ruptured. WHAT TO DO IF YOU THINK YOUR MEMBRANES HAVE RUPTURED Call your health care  provider right away. You will need to go to the hospital to get checked immediately. WHAT HAPPENS IF YOU ARE DIAGNOSED WITH PROM OR PPROM? Once you arrive at the hospital, you will have tests done. A cervical exam will be performed to check if the cervix has softened or started to open (dilate). If you are diagnosed with PROM, you may be induced within 24 hours if you are not having contractions. If you are diagnosed with PPROM and are not having contractions, you may be induced depending on your trimester.  If you have PPROM, you:  And your baby will be monitored closely for signs of infection or other complications.  May be given an antibiotic medicine to lower the chances of an infection developing.  May be given a steroid medicine to help mature the baby's lungs faster.  May be given a medicine to stop preterm labor.  May be ordered to be on bed rest at home or in the hospital.  May be induced if complications arise for you or the baby. Your treatment will depend on many factors, such as how far along you are, the development of the baby, and other complications that may arise. Document Released: 05/14/2005 Document Revised: 03/04/2013 Document Reviewed: 09/02/2012 [facility] Patient Information 2015 [place], [place]. This information is not intended to replace advice given to you by your health care provider. Make sure you discuss any questions you have with your health care provider.

## 2014-10-27 NOTE — Discharge Summary (Signed)
Physician Discharge Summary  Patient ID: Traci Peterson MRN: [id number] DOB/AGE: Dec 04, 1985 29 y.o.  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  SROM was verified by speculum exam.  Cervix appeared closed on exam.  Wet prep + for BV, and Metronidazole course begun.  Cultures were negative.  FHR remained reassuring during her hospitalization.  Patient and husband declined induction of labor and wished to have expectant management of the pregnancy.    MFM consult was obtained, with recommendation for NICU consult if undelivered by 21 weeks to determine when family will want everything done for the baby.  Would plan inpatient admission from that date to maximize outcomes for mother and baby.  Antenatal steroids to be considered over the 2 days prior to admission, with plan for PROM latency ATB on admission.  Recurrence risk for PROM of 10-15% reviewed with patient, with consideration of prophylactic progesterone in future pregnancies to try to decrease the recurrence of PROM.  Possible outcomes of the pregnancy were also discussed--CP, RDS, infection, possible need for C/S, with possible need for classical incision due to prematurity.  Patient remained afebrile and stable over the next 48 hours, and she was d/c'd home on 10/27/14, based on MFM recommendation for outpatient management at present.  Home on modified BR, pelvic rest. Rx Metronidazole continued to complete 14 day course.  Rx Ambien, #30,  no refills, for sleep.  Patient advised to f/u by phone with pulmonologist who did previous sleep study to discuss findings, due to increased snoring during pregnancy.  Office notified of patient status, patient placed on HR list, and Suszanne Finch notified of patient circumstances (she will notify patient's caseworker, Development worker, international aid).  Consults: MFM  Significant Diagnostic Studies: US--oligohydramnios noted (0.8), cervix 2.5 cm, breech, anterior placenta.  Treatments: IV hydration  Discharge Exam: Blood pressure 121/80, pulse 109, temperature 97.9 F (36.6 C), temperature source Oral, resp. rate 18, height  (1.575 m), weight 125.465 kg (276 lb 9.6 oz), last menstrual period 06/17/2014, SpO2 99 %. General appearance: alert Resp: clear to auscultation bilaterally Cardio: regular rate and rhythm, S1, S2 normal, no murmur, click, rub or gallop Pelvic: Small amount leaking of clear fluid, VE deferred.  Uterus fundal height 19 weeks, NT. Extremities: extremities normal, atraumatic, no cyanosis or edema  Disposition: 01-Home or Self Care   Discharge Instructions:  PPROM To call for temp > or = 100.4, any bleeding or significant pain, foul d/c, or any other issues. F/u visit at [facility] 11/04/14, with [place] that day. Plan weekly visits after that.      Medication List    TAKE these medications        metroNIDAZOLE 500 MG tablet  Commonly known as:  FLAGYL  Take 1 tablet (500 mg total) by mouth 2 (two) times daily.     prenatal multivitamin Tabs tablet  Take 1 tablet by mouth daily at 12 noon.           Follow-up Information  [facility].   Specialty:  Obstetrics and Gynecology   Why:  Call for any concerns or questions.  Keep scheduled appt at [facility].   Contact information:   [address] [place] [id number] [phone]      Signed: Nigel BridgemanLATHAM, Eustolia Drennen 10/27/2014, 9:00 AM

## 2014-10-27 NOTE — Progress Notes (Addendum)
Patient's IV removed.  Site WNL.  AVS reviewed with patient.  Verbalized understanding of discharge instructions, when to call the doctor, physician follow-up, and medications.  Instructed to check temperature daily and to call MD if temperature is over 100.4 degrees per physician's orders.  Verbalized understanding. Prescription for Ambien given to patient.  Antibiotic was sent to local pharmacy.  Verbalized understanding.  Patient reports all belongings intact and in possession at time of discharge.  Patient transported by RN to main entrance via wheelchair for discharge.  Patient stable at time of discharge.

## 2014-10-28 LAB — CULTURE, OB URINE

## 2014-11-10 ENCOUNTER — Inpatient Hospital Stay (HOSPITAL_COMMUNITY)
Admission: AD | Admit: 2014-11-10 | Discharge: 2014-11-10 | Disposition: A | Discharge status: Home / Self Care | Payer: Medicaid Other | Source: Ambulatory Visit | Attending: Obstetrics and Gynecology | Admitting: Obstetrics and Gynecology

## 2014-11-10 ENCOUNTER — Encounter (HOSPITAL_COMMUNITY): Payer: Self-pay | Admitting: *Deleted

## 2014-11-10 DIAGNOSIS — O42912 Preterm premature rupture of membranes, unspecified as to length of time between rupture and onset of labor, second trimester: Secondary | ICD-10-CM | POA: Diagnosis not present

## 2014-11-10 DIAGNOSIS — Z3A20 20 weeks gestation of pregnancy: Secondary | ICD-10-CM | POA: Diagnosis not present

## 2014-11-10 DIAGNOSIS — O9989 Other specified diseases and conditions complicating pregnancy, childbirth and the puerperium: Secondary | ICD-10-CM | POA: Diagnosis present

## 2014-11-10 DIAGNOSIS — Z87891 Personal history of nicotine dependence: Secondary | ICD-10-CM | POA: Insufficient documentation

## 2014-11-10 LAB — WET PREP, GENITAL
Clue Cells Wet Prep HPF POC: NONE SEEN
Trich, Wet Prep: NONE SEEN
YEAST WET PREP: NONE SEEN

## 2014-11-10 NOTE — MAU Provider Note (Signed)
History  29 yo G1P0 @ 20.6 wks w/ known history of ROM since 18.1 wks presents unannounced to MAU due to increased leaking of fluid. +FM and occ cramping. Denies bleeding, elevated temp, foul odor or abdominal tenderness.   Patient Active Problem List   Diagnosis Date Noted  . BV (bacterial vaginosis) 10/25/2014  . Morbidly obese 10/25/2014  . Eczema 10/25/2014  . 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 Chronic headaches -  no meds 10/25/2014  . PROM (premature rupture of membranes) 10/24/2014  . Anxiety and depression 11/30/2013  . Snoring 08/11/2013  . Eyelash scantiness 05/25/2013  . Tobacco abuse 05/25/2013  . Atypical squamous cells of undetermined significance with positive high risk human papillomavirus 05/08/2013  . PCOS (polycystic ovarian syndrome) 04/20/2013    Chief Complaint  Patient presents with  . Rupture of Membranes    leaking more fluid   HPI As above OB History    Gravida Para Term Preterm AB TAB SAB Ectopic Multiple Living   1               Past Medical History  Diagnosis Date  . Anxiety     no current med.  . Pilonidal cyst 02/2013  . Jaw snapping     states jaw pops if opens mouth too wide  . Menstrual bleeding problem     menses from 12/22/2012 - current (03/06/2013)  . Chronic headaches   . Ovarian cyst     Past Surgical History  Procedure Laterality Date  . Wisdom tooth extraction    . Pilonidal cyst excision N/A 03/12/2013    Procedure: CYST EXCISION PILONIDAL EXTENSIVE;  Surgeon: Shelly Rubenstein, MD;  Location:  SUR[facility]ERY CENTER;  Service: General;  Laterality: N/A;    Family History  Problem Relation Age of Onset  . Hypertension Mother   . Hyperlipidemia Mother   . Diabetes Mother   . Thyroid disease Mother   . Diabetes Father   . Hypertension Father   . Alcohol abuse Father   . Breast cancer Maternal Aunt   . Lung cancer Paternal  Grandfather     History  Substance Use Topics  . Smoking status: Former Smoker -- 0.25 packs/day for 6 years    Types: Cigarettes    Quit date: 06/27/2013  . Smokeless tobacco: Never Used     Comment: 3-5 cig./day  . Alcohol Use: No     Comment: socially    Allergies:  Allergies  Allergen Reactions  . Orange Syrup Hives and Shortness Of Breath    ORANGE DYE    Prescriptions prior to admission  Medication Sig Dispense Refill Last Dose  . Prenatal Vit-Fe Fumarate-FA (PRENATAL MULTIVITAMIN) TABS tablet Take 1 tablet by mouth daily at 12 noon.   Past Week at Unknown time  . zolpidem (AMBIEN) 5 MG tablet Take 1 tablet (5 mg total) by mouth at bedtime as needed for sleep. 30 tablet 0     ROS  +FM +cramping Physical Exam   Results for orders placed or performed during the hospital encounter of 11/10/14 (from the past 24 hour(s))  Wet prep, genital     Status: Abnormal   Collection Time: 11/10/14  3:00 AM  Result Value Ref Range   Yeast Wet Prep HPF POC NONE SEEN NONE SEEN   Trich, Wet Prep NONE SEEN NONE SEEN   Clue Cells Wet Prep HPF POC NONE SEEN  NONE SEEN   WBC, Wet Prep HPF POC MODERATE (A) NONE SEEN   Blood pressure 123/67, pulse 121, temperature 98.5 F (36.9 C), temperature source Oral, resp. rate 20, height  (1.575 m), weight 123.435 kg (272 lb 2 oz), last menstrual period 06/17/2014.  Physical Exam Gen: NAD Lungs: CTAB CV: RRR w/o M/R/G Abdomen: gravid, soft, NT, no rebound or guarding Spec: Cvx visually closed - foul odor -- sample obtained for wet prep FHR: 161 bpm Toco: no ctxs ED Course  Assessment: Previable Preterm Premature ROM. Discussed w/ Dr. Normand Sloop. Afebrile -- no concerns for infection.  Plan: Reassurance.  Previable PPROM instructions reiterated. Office f/u as scheduled.   Sherre Scarlet CNM, MS 11/10/2014 3:36 AM

## 2014-11-10 NOTE — Discharge Instructions (Signed)
Premature Rupture and Preterm Premature Rupture of Membranes °A sac made up of membranes surrounds your baby in the womb (uterus). When this sac breaks before contractions or labor starts, it is called premature rupture of membranes (PROM). Rupture of membranes is also known as your water breaking. If this happens before 37 weeks, it is called preterm premature rupture of membranes (PPROM). PPROM is serious. It needs medical care right away. °CAUSES  °PROM may be caused by the membranes getting weak. This happens at the end of pregnancy. PPROM is often due to an infection, but can be caused by a number of other things.  °SIGNS OF PROM OR PPROM °· A sudden gush of fluid from the vagina. °· A slow leak of fluid from the vagina. °· Your underwear stay wet. °WHAT TO DO IF YOU THINK YOUR WATER BROKE °Call your doctor right away. You will need to go to the hospital to get checked right away. °WHAT HAPPENS IF YOU ARE TOLD YOU HAVE PROM OR PPROM? °You will have tests done at the hospital. If you have PROM, you may be given medicine to start labor (induced). This may happen if you are not having contractions within 24 hours of your water breaking. If you have PPROM and are not having contractions, you may be given medicine to start labor. It will depend on how far along you are in your pregnancy. °If you have PPROM, you: °· And your baby will be watched closely for signs of infection or other problems. °· May be given an antibiotic medicine. This can stop an infection from starting. °· May be given a steroid medicine. This can help the lungs to develop faster. °· May be given a medicine to stop early labor (preterm labor). °· May be told to stay in bed except to use the restroom (bed rest). °· May be given medicine to start labor. This can happen if there are problems with you or the baby. °Your treatment will depend on many factors. °Document Released: 08/10/2008 Document Revised: 01/14/2013 Document Reviewed:  09/02/2012 °ExitCare® Patient Information ©2015 [employer], [employer]. This information is not intended to replace advice given to you by your health care provider. Make sure you discuss any questions you have with your health care provider. ° °

## 2014-11-10 NOTE — MAU Note (Signed)
PT SAYS SHE  HAD SROM AT 18 WEEKS -  BUT  TONIGHT  AT 0145-   MORE FLUID   RAN OUT..   NO FEVER,    NO BLEEDING.  LAST SEX-3 WEEKS  AGO

## 2014-11-11 ENCOUNTER — Inpatient Hospital Stay (HOSPITAL_COMMUNITY): Payer: Medicaid Other

## 2014-11-11 ENCOUNTER — Encounter (HOSPITAL_COMMUNITY): Payer: Self-pay | Admitting: *Deleted

## 2014-11-11 ENCOUNTER — Inpatient Hospital Stay (HOSPITAL_COMMUNITY)
Admission: AD | Admit: 2014-11-11 | Discharge: 2014-11-11 | Disposition: A | Discharge status: Home / Self Care | Payer: Medicaid Other | Source: Ambulatory Visit | Attending: Obstetrics and Gynecology | Admitting: Obstetrics and Gynecology

## 2014-11-11 DIAGNOSIS — O9989 Other specified diseases and conditions complicating pregnancy, childbirth and the puerperium: Secondary | ICD-10-CM | POA: Insufficient documentation

## 2014-11-11 DIAGNOSIS — O26899 Other specified pregnancy related conditions, unspecified trimester: Secondary | ICD-10-CM | POA: Insufficient documentation

## 2014-11-11 DIAGNOSIS — O98211 Gonorrhea complicating pregnancy, first trimester: Secondary | ICD-10-CM

## 2014-11-11 DIAGNOSIS — O42919 Preterm premature rupture of membranes, unspecified as to length of time between rupture and onset of labor, unspecified trimester: Secondary | ICD-10-CM | POA: Insufficient documentation

## 2014-11-11 DIAGNOSIS — R109 Unspecified abdominal pain: Secondary | ICD-10-CM | POA: Insufficient documentation

## 2014-11-11 DIAGNOSIS — O42912 Preterm premature rupture of membranes, unspecified as to length of time between rupture and onset of labor, second trimester: Secondary | ICD-10-CM | POA: Insufficient documentation

## 2014-11-11 DIAGNOSIS — Z87891 Personal history of nicotine dependence: Secondary | ICD-10-CM

## 2014-11-11 DIAGNOSIS — Z3A21 21 weeks gestation of pregnancy: Secondary | ICD-10-CM | POA: Insufficient documentation

## 2014-11-11 LAB — CBC WITH DIFFERENTIAL/PLATELET
Basophils Absolute: 0 10*3/uL (ref 0.0–0.1)
Basophils Relative: 0 % (ref 0–1)
Eosinophils Absolute: 0.1 10*3/uL (ref 0.0–0.7)
Eosinophils Relative: 1 % (ref 0–5)
HEMATOCRIT: 35.5 % — AB (ref 36.0–46.0)
HEMOGLOBIN: 12.3 g/dL (ref 12.0–15.0)
LYMPHS ABS: 1.8 10*3/uL (ref 0.7–4.0)
LYMPHS PCT: 18 % (ref 12–46)
MCH: 30.1 pg (ref 26.0–34.0)
MCHC: 34.6 g/dL (ref 30.0–36.0)
MCV: 86.8 fL (ref 78.0–100.0)
MONO ABS: 0.4 10*3/uL (ref 0.1–1.0)
MONOS PCT: 4 % (ref 3–12)
NEUTROS ABS: 8.1 10*3/uL — AB (ref 1.7–7.7)
Neutrophils Relative %: 77 % (ref 43–77)
Platelets: 200 10*3/uL (ref 150–400)
RBC: 4.09 MIL/uL (ref 3.87–5.11)
RDW: 14 % (ref 11.5–15.5)
WBC: 10.4 10*3/uL (ref 4.0–10.5)

## 2014-11-11 LAB — URINE MICROSCOPIC-ADD ON

## 2014-11-11 LAB — URINALYSIS, ROUTINE W REFLEX MICROSCOPIC
Bilirubin Urine: NEGATIVE
Glucose, UA: NEGATIVE mg/dL
KETONES UR: NEGATIVE mg/dL
NITRITE: NEGATIVE
Protein, ur: NEGATIVE mg/dL
Specific Gravity, Urine: 1.01 (ref 1.005–1.030)
Urobilinogen, UA: 0.2 mg/dL (ref 0.0–1.0)
pH: 6.5 (ref 5.0–8.0)

## 2014-11-11 MED ORDER — IBUPROFEN 600 MG PO TABS
600.0000 mg | ORAL_TABLET | ORAL | Status: AC
  Administered 2014-11-11: 600 mg via ORAL
  Filled 2014-11-11: qty 1

## 2014-11-11 NOTE — Progress Notes (Signed)
States felt one contraction just prior to U/S.

## 2014-11-11 NOTE — MAU Provider Note (Signed)
History   29 yo G1P0 at 21 weeks with known PPROM since 10/24/14 presented after calling to report increased cramping during the night, which awakened her from sleep.  Has noted cramping over the last week, but these were stronger today.  Currently noting cramping q 1 hour.  Denies bleeding, fever, foul d/c, N/V.  Still noting leaking--seen in MAU 6/15 in early am for "more leaking".  Cervix closed then, wet prep negative.  Scheduled for office visit today at 10:45am, with [place] for fluid evaluation.  Patient Active Problem List   Diagnosis Date Noted  . Gonorrhea complicating pregnancy in first trimester--dx 3/1, neg TOC 09/24/14 11/11/2014  . BV (bacterial vaginosis) 10/25/2014  . Morbidly obese 10/25/2014  . Eczema 10/25/2014  . 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 Chronic headaches -  no meds 10/25/2014  . PROM (premature rupture of membranes) at 18 4/7 weeks 10/24/2014  . Anxiety and depression 11/30/2013  . Tobacco abuse 05/25/2013  . Atypical squamous cells of undetermined significance with positive high risk human papillomavirus 05/08/2013  . PCOS (polycystic ovarian syndrome) 04/20/2013    Chief Complaint  Patient presents with  . Contractions   HPI:  As above  OB History    Gravida Para Term Preterm AB TAB SAB Ectopic Multiple Living   1               Past Medical History  Diagnosis Date  . Anxiety     no current med.  . Pilonidal cyst 02/2013  . Jaw snapping     states jaw pops if opens mouth too wide  . Menstrual bleeding problem     menses from 12/22/2012 - current (03/06/2013)  . Chronic headaches   . Ovarian cyst   . [redacted] weeks gestation of pregnancy     Past Surgical History  Procedure Laterality Date  . Wisdom tooth extraction    . Pilonidal cyst excision N/A 03/12/2013    Procedure: CYST EXCISION PILONIDAL EXTENSIVE;  Surgeon: Shelly Rubenstein, MD;  Location: [facility];  Service: General;  Laterality: N/A;    Family History  Problem Relation Age of Onset  . Hypertension Mother   . Hyperlipidemia Mother   . Diabetes Mother   . Thyroid disease Mother   . Diabetes Father   . Hypertension Father   . Alcohol abuse Father   . Breast cancer Maternal Aunt   . Lung cancer Paternal Grandfather     History  Substance Use Topics  . Smoking status: Former Smoker -- 0.25 packs/day for 6 years    Types: Cigarettes    Quit date: 06/27/2013  . Smokeless tobacco: Never Used     Comment: 3-5 cig./day  . Alcohol Use: No     Comment: socially    Allergies:  Allergies  Allergen Reactions  . Orange Syrup Hives and Shortness Of Breath    ORANGE DYE    Prescriptions prior to admission  Medication Sig Dispense Refill Last Dose  . Prenatal Vit-Fe Fumarate-FA (PRENATAL MULTIVITAMIN) TABS tablet Take 1 tablet by mouth daily at 12 noon.   Past Week at Unknown time  . zolpidem (AMBIEN) 5 MG tablet Take 1 tablet (5 mg total) by mouth at bedtime as needed for sleep. 30 tablet 0     ROS:  Leaking clear fluid, cramping hourly Physical Exam   Blood pressure 116/60, pulse 109, temperature 98.2 F (  36.8 C), temperature source Oral, resp. rate 18, height  (1.575 m), weight 121.564 kg (268 lb), last menstrual period 06/17/2014.   Physical Exam  In NAD Chest clear Heart RRR without murmur Abd gravid, NT Pelvic--leaking clear fluid, no foul d/c noted.  Sterile speculum exam--cervix appears closed and long, no bleeding. Ext WNL  FHR 155. One possible mild UC on toco.  ED Course  Assessment: IUP at 21 weeks, PPROM 10/24/14 Cramping q hour  Plan: CBC/diff GBS Urine culture Ibuprophen 600 mg po now. [place] for cervical length and fluid volume   Nigel Bridgeman CNM, MSN 11/11/2014 8:38 AM  Addendum: Still only 1 cramp/UC per hour. Received Ibuprophen at 0833--she is unaware of any change in sx.  Results for orders placed or performed  during the hospital encounter of 11/11/14 (from the past 24 hour(s))  Urinalysis, Routine w reflex microscopic (not at [facility])     Status: Abnormal   Collection Time: 11/11/14  7:37 AM  Result Value Ref Range   Color, Urine YELLOW YELLOW   APPearance HAZY (A) CLEAR   Specific Gravity, Urine 1.010 1.005 - 1.030   pH 6.5 5.0 - 8.0   Glucose, UA NEGATIVE NEGATIVE mg/dL   Hgb urine dipstick SMALL (A) NEGATIVE   Bilirubin Urine NEGATIVE NEGATIVE   Ketones, ur NEGATIVE NEGATIVE mg/dL   Protein, ur NEGATIVE NEGATIVE mg/dL   Urobilinogen, UA 0.2 0.0 - 1.0 mg/dL   Nitrite NEGATIVE NEGATIVE   Leukocytes, UA LARGE (A) NEGATIVE  Urine microscopic-add on     Status: Abnormal   Collection Time: 11/11/14  7:37 AM  Result Value Ref Range   Squamous Epithelial / LPF FEW (A) RARE   WBC, UA 3-6 <3 WBC/hpf   RBC / HPF 0-2 <3 RBC/hpf   Bacteria, UA FEW (A) RARE  CBC with Differential/Platelet     Status: Abnormal   Collection Time: 11/11/14  8:30 AM  Result Value Ref Range   WBC 10.4 4.0 - 10.5 K/uL   RBC 4.09 3.87 - 5.11 MIL/uL   Hemoglobin 12.3 12.0 - 15.0 g/dL   HCT 86.5 (L) 78.4 - 69.6 %   MCV 86.8 78.0 - 100.0 fL   MCH 30.1 26.0 - 34.0 pg   MCHC 34.6 30.0 - 36.0 g/dL   RDW 29.5 28.4 - 13.2 %   Platelets 200 150 - 400 K/uL   Neutrophils Relative % 77 43 - 77 %   Neutro Abs 8.1 (H) 1.7 - 7.7 K/uL   Lymphocytes Relative 18 12 - 46 %   Lymphs Abs 1.8 0.7 - 4.0 K/uL   Monocytes Relative 4 3 - 12 %   Monocytes Absolute 0.4 0.1 - 1.0 K/uL   Eosinophils Relative 1 0 - 5 %   Eosinophils Absolute 0.1 0.0 - 0.7 K/uL   Basophils Relative 0 0 - 1 %   Basophils Absolute 0.0 0.0 - 0.1 K/uL   [place]:  Breech, anhydramnios, cervix 3.38 cm.  Impression: PPROM at 74 4/7 weeks, now 21 weeks Occasional cramping  Plan: D/c home--to continue rest, fluids. Plan f/u in office in 1 week (scheduled) To f/u with any bleeding, fever, severe pain, etc. Support to patient and husband for concerns regarding  pregnancy.  Nigel Bridgeman, CNM 11/11/14 1050

## 2014-11-11 NOTE — MAU Note (Signed)
States has been having contractions for more than a week, but was awakened from sleep at 0300 with strong contractions and seem to be getting stronger. Continues to leak some fluid. No bleeding.

## 2014-11-11 NOTE — Discharge Instructions (Signed)
Premature Rupture and Preterm Premature Rupture of Membranes °Premature rupture of membranes (PROM) is when the membranes (amniotic sac) break open before contractions or labor starts. Rupture of membranes is commonly referred to as your water breaking. If PROM occurs before 37 weeks of pregnancy, it is called preterm premature rupture of membranes (PPROM). The amniotic sac holds the fetus, keeps infection out, and performs other important functions. Having the amniotic sac rupture before 37 weeks of pregnancy can lead to serious problems and requires immediate attention by your health care provider. °CAUSES  °PROM near the end of the pregnancy may be caused by natural weakening of the membranes. PPROM is often due to an infection. Other factors that may be associated with PROM include: °· Stretching of the amniotic sac because of carrying multiples or having too much amniotic fluid. °· Trauma. °· Smoking during pregnancy. °· Poor nutrition. °· Previous preterm birth. °· Vaginal bleeding. °· Little to no prenatal care. °· Problems with the placenta, such as placenta previa or placental abruption. °RISKS OF PROM AND PPROM °· Delivering a premature baby. °· Getting a serious infection of the placental tissues (chorioamnionitis). °· Early detachment of the placenta from the uterus (placental abruption). °· Compression of the umbilical cord. °· Needing a cesarean birth. °· Developing a serious infection after delivery. °SIGNS OF PROM OR PPROM  °· A sudden gush or slow leaking of fluid from the vagina. °· Constant wet underwear. °Sometimes, women mistake the leaking or wetness for urine, especially if the leak is slow and not a gush of fluid. If there is constant leaking or your underwear continues to get wet, your membranes have likely ruptured. °WHAT TO DO IF YOU THINK YOUR MEMBRANES HAVE RUPTURED °Call your health care provider right away. You will need to go to the hospital to get checked immediately. °WHAT HAPPENS  IF YOU ARE DIAGNOSED WITH PROM OR PPROM? °Once you arrive at the hospital, you will have tests done. A cervical exam will be performed to check if the cervix has softened or started to open (dilate). If you are diagnosed with PROM, you may be induced within 24 hours if you are not having contractions. If you are diagnosed with PPROM and are not having contractions, you may be induced depending on your trimester.  °If you have PPROM, you: °· And your baby will be monitored closely for signs of infection or other complications. °· May be given an antibiotic medicine to lower the chances of an infection developing. °· May be given a steroid medicine to help mature the baby's lungs faster. °· May be given a medicine to stop preterm labor. °· May be ordered to be on bed rest at home or in the hospital. °· May be induced if complications arise for you or the baby. °Your treatment will depend on many factors, such as how far along you are, the development of the baby, and other complications that may arise. °Document Released: 05/14/2005 Document Revised: 03/04/2013 Document Reviewed: 09/02/2012 °ExitCare® Patient Information ©2015 [employer]. This information is not intended to replace advice given to you by your health care provider. Make sure you discuss any questions you have with your health care provider. ° °

## 2014-11-12 ENCOUNTER — Inpatient Hospital Stay (HOSPITAL_COMMUNITY)
Admission: AD | Admit: 2014-11-12 | Discharge: 2014-11-13 | DRG: 774 | Disposition: A | Discharge status: Home / Self Care | Payer: Medicaid Other | Attending: Obstetrics and Gynecology | Admitting: Obstetrics and Gynecology

## 2014-11-12 ENCOUNTER — Encounter (HOSPITAL_COMMUNITY): Payer: Self-pay

## 2014-11-12 DIAGNOSIS — Z6841 Body Mass Index (BMI) 40.0 and over, adult: Secondary | ICD-10-CM

## 2014-11-12 DIAGNOSIS — O42912 Preterm premature rupture of membranes, unspecified as to length of time between rupture and onset of labor, second trimester: Secondary | ICD-10-CM | POA: Diagnosis present

## 2014-11-12 DIAGNOSIS — F329 Major depressive disorder, single episode, unspecified: Secondary | ICD-10-CM | POA: Diagnosis present

## 2014-11-12 DIAGNOSIS — IMO0002 Reserved for concepts with insufficient information to code with codable children: Secondary | ICD-10-CM | POA: Diagnosis present

## 2014-11-12 DIAGNOSIS — O99214 Obesity complicating childbirth: Secondary | ICD-10-CM | POA: Diagnosis present

## 2014-11-12 DIAGNOSIS — O99344 Other mental disorders complicating childbirth: Secondary | ICD-10-CM | POA: Diagnosis present

## 2014-11-12 DIAGNOSIS — Z87891 Personal history of nicotine dependence: Secondary | ICD-10-CM | POA: Diagnosis not present

## 2014-11-12 DIAGNOSIS — O364XX0 Maternal care for intrauterine death, not applicable or unspecified: Secondary | ICD-10-CM | POA: Diagnosis present

## 2014-11-12 DIAGNOSIS — O9822 Gonorrhea complicating childbirth: Secondary | ICD-10-CM | POA: Diagnosis present

## 2014-11-12 DIAGNOSIS — F419 Anxiety disorder, unspecified: Secondary | ICD-10-CM | POA: Diagnosis present

## 2014-11-12 DIAGNOSIS — Z3A21 21 weeks gestation of pregnancy: Secondary | ICD-10-CM | POA: Diagnosis present

## 2014-11-12 LAB — URINE CULTURE

## 2014-11-12 MED ORDER — BENZOCAINE-MENTHOL 20-0.5 % EX AERO
1.0000 "application " | INHALATION_SPRAY | CUTANEOUS | Status: DC | PRN
  Filled 2014-11-12: qty 56

## 2014-11-12 MED ORDER — SIMETHICONE 80 MG PO CHEW
80.0000 mg | CHEWABLE_TABLET | ORAL | Status: DC | PRN
  Filled 2014-11-12: qty 1

## 2014-11-12 MED ORDER — MISOPROSTOL 200 MCG PO TABS
600.0000 ug | ORAL_TABLET | Freq: Once | ORAL | Status: AC
  Administered 2014-11-12: 600 ug via RECTAL
  Filled 2014-11-12: qty 3

## 2014-11-12 MED ORDER — WITCH HAZEL-GLYCERIN EX PADS: 1.0000 "application " | MEDICATED_PAD | CUTANEOUS | Status: DC | PRN

## 2014-11-12 MED ORDER — DIBUCAINE 1 % RE OINT
1.0000 "application " | TOPICAL_OINTMENT | RECTAL | Status: DC | PRN
  Filled 2014-11-12: qty 28

## 2014-11-12 MED ORDER — TETANUS-DIPHTH-ACELL PERTUSSIS 5-2.5-18.5 LF-MCG/0.5 IM SUSP: 0.5000 mL | Freq: Once | INTRAMUSCULAR | Status: DC

## 2014-11-12 MED ORDER — OXYCODONE-ACETAMINOPHEN 5-325 MG PO TABS
2.0000 | ORAL_TABLET | ORAL | Status: DC | PRN
  Administered 2014-11-12 – 2014-11-13 (×2): 2 via ORAL
  Filled 2014-11-12 (×2): qty 2

## 2014-11-12 MED ORDER — OXYCODONE-ACETAMINOPHEN 5-325 MG PO TABS
1.0000 | ORAL_TABLET | ORAL | Status: DC | PRN
Start: 2014-11-12 — End: 2014-11-13
  Administered 2014-11-12 (×2): 1 via ORAL
  Filled 2014-11-12 (×2): qty 1

## 2014-11-12 MED ORDER — ACETAMINOPHEN 325 MG PO TABS: 650.0000 mg | ORAL_TABLET | ORAL | Status: DC | PRN

## 2014-11-12 MED ORDER — ONDANSETRON HCL 4 MG PO TABS: 4.0000 mg | ORAL_TABLET | ORAL | Status: DC | PRN

## 2014-11-12 MED ORDER — LACTATED RINGERS IV SOLN: INTRAVENOUS | Status: DC

## 2014-11-12 MED ORDER — ONDANSETRON HCL 4 MG/2ML IJ SOLN
4.0000 mg | INTRAMUSCULAR | Status: DC | PRN
Start: 2014-11-12 — End: 2014-11-13

## 2014-11-12 MED ORDER — OXYTOCIN 40 UNITS IN LACTATED RINGERS INFUSION - SIMPLE MED
INTRAVENOUS | Status: AC
  Administered 2014-11-12: 07:00:00
  Filled 2014-11-12: qty 1000

## 2014-11-12 MED ORDER — MAGNESIUM HYDROXIDE 400 MG/5ML PO SUSP
30.0000 mL | ORAL | Status: DC | PRN
  Administered 2014-11-12: 30 mL via ORAL
  Filled 2014-11-12 (×2): qty 30

## 2014-11-12 MED ORDER — METHYLERGONOVINE MALEATE 0.2 MG PO TABS: 0.2000 mg | ORAL_TABLET | ORAL | Status: DC | PRN

## 2014-11-12 MED ORDER — METHYLERGONOVINE MALEATE 0.2 MG/ML IJ SOLN: 0.2000 mg | INTRAMUSCULAR | Status: DC | PRN

## 2014-11-12 MED ORDER — NALBUPHINE HCL 10 MG/ML IJ SOLN
10.0000 mg | INTRAMUSCULAR | Status: DC | PRN
  Administered 2014-11-12 (×2): 10 mg via INTRAVENOUS
  Filled 2014-11-12 (×2): qty 1

## 2014-11-12 NOTE — MAU Note (Signed)
Pt here via EMS having delivered at home at [redacted] wks gestation. Pt has been ruptured since 18 wks. Started hurting this AM about 0200, called EMS.  Delivered with EMS present at 0340, no signs of life at delivery per EMS.

## 2014-11-12 NOTE — Progress Notes (Signed)
I spent time with pt and family providing grief support and emotional and spiritual support.  Family is planning on using [facility].  They received information on Heartstrings and follow up support here as well.  Family is surrounding [name]) and [place].  They are very supportive.  [employer] Pager, [phone] 3:56 PM    11/12/14 1500  Clinical Encounter Type  Visited With Patient;Patient and family together  Visit Type Spiritual support  Referral From Nurse  Spiritual Encounters  Spiritual Needs Emotional;Grief support  Stress Factors  Patient Stress Factors Loss

## 2014-11-12 NOTE — H&P (Signed)
Traci Peterson "Traci Peterson" is a 29 y.o. female, G1nowP0100 at 21.1 weeks, presents by EMS s/p delivery at home at ~ 3:40 AM. Gives history of having contractions. Tried sleeping and awoke to worsening ctxs and urge to have a bowel movement. In the process of trying to have a bowel movement, was pushing and noticed baby protruding from vagina. Ctxs continued and baby spontaneously delivered. EMS contacted and pt brought here for further management. Denies lightheadedness, dizziness or heavy vaginal bleeding.  Patient Active Problem List   Diagnosis Date Noted  . BV (bacterial vaginosis) 10/25/2014  . Morbidly obese 10/25/2014  . Eczema 10/25/2014  . 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 Chronic headaches - no meds 10/25/2014  . PROM (premature rupture of membranes) 10/24/2014  . Anxiety and depression 11/30/2013  . Snoring 08/11/2013  . Eyelash scantiness 05/25/2013  . Tobacco abuse 05/25/2013  . Atypical squamous cells of undetermined significance with positive high risk human papillomavirus 05/08/2013  . PCOS (polycystic ovarian syndrome) 04/20/2013   Gonorrhea complicating pregnancy in first trimester--dx 3/1, neg TOC 09/24/14 11/11/2014       IUFD  --- 11/12/14  History of present pregnancy: Patient entered care at 15.6 weeks. Seen early in pregnancy at the health department.  EDC of 03/24/15 was established by sure LMP and that was c/w u/s at 10.2 wks.  Anatomy scan:20 wks. EFW 11 oz, good interval growth. Cvx 2.84 cm. Need f/u for heart, profile, abdomen. Oligo.   Additional [place] evaluations:  13.4 wks for first trimester screen (MFM): SIUP. Normal NT (1.6 mm). Unable to visualize NB due to fetal position. Pt was to be offered MSAFP for neural tube defect screening. 21.0 wks: Anhydramnios. Breech. Cvx 3.8 cm. Significant prenatal events: Obesity - BMI  42.9. Seen early in pregnancy at health department. Reports +GC in March of this year in [place] - treated. Common discomforts of pregnancy, i.e, nausea and round ligament pain experienced. Admitted to [facility] 5/30-6/1 w/ previable PPROM at 18.4 wks. Cultures neg. Had MFM consult. D/C'd home on 10/27/14, w/ f/u visits q week. Pt was to monitor for s/s of infection (fever, foul d/c), bleeding, or any other issues.  Last evaluation: MAU 11/11/14 @ 21.0 wks by Traci Peterson, CNM due to cramping. Cvx closed on speculum exam, with normal CBC/diff. 1 ctx per hour. FHR 160 bpm. U/S = breech, anhydramnios, cvx 3.8 cm.  OB History    Gravida Para Term Preterm AB TAB SAB Ectopic Multiple Living   1   1      0     Past Medical History  Diagnosis Date  . Anxiety     no current med.  . Pilonidal cyst 02/2013  . Jaw snapping     states jaw pops if opens mouth too wide  . Menstrual bleeding problem     menses from 12/22/2012 - current (03/06/2013)  . Chronic headaches   . Ovarian cyst    Past Surgical History  Procedure Laterality Date  . Wisdom tooth extraction    . Pilonidal cyst excision N/A 03/12/2013    Procedure: CYST EXCISION PILONIDAL EXTENSIVE; Surgeon: Shelly Rubenstein, MD; Location: [facility]; Service: General; Laterality: N/A;   Family History: family history includes Alcohol abuse in her father; Breast cancer in her maternal aunt; Diabetes in her father and mother; Hyperlipidemia in her mother; Hypertension in her father and mother; Lung cancer in  her paternal grandfather; Thyroid disease in her mother.Heroin and crack cocaine dependence in her father, Graves disease and CHF in her mother, DM, Alzheimer's, heart disease, dementia and schizophrenia in her MGM, Type 1 DM, Rheumatoid Arthritis and malignant tumor of breast in her maternal aunt and Type 2 diabetes in her maternal uncle. Social History:  reports  that she quit smoking about 15 months ago. Her smoking use included Cigarettes. She has a 1.5 pack-year smoking history. She has never used smokeless tobacco. 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 She is married to [name] who is present, supportive and involved. She also has a number of other family members and friends who accompanied her to the hospital. She is of the [place] faith. She is open to prayers and visits from members of [facility] pastoral department, but has several spiritual advisors who will be actively involved in her care.    Prenatal Transfer Tool  Maternal Diabetes: Unknown Genetic Screening: Normal first trimester screen Maternal Ultrasounds/Referrals: Oligo Fetal Ultrasounds or other Referrals: Other: Breech, anhydramnios on 11/11/14 Maternal Substance Abuse: No Significant Maternal Medications: Meds include: Other: Citranatal B-calm (Fe Gluc) Significant Maternal Lab Results: None  TDAP: NA Flu: NA  ROS: Per HPI above  Allergies  Allergen Reactions  . Orange Syrup Hives and Shortness Of Breath    ORANGE DYE   Today's Vitals   11/12/14 0445 11/12/14 0500 11/12/14 0515 11/12/14 0520  BP: 105/72 111/76 118/65   Pulse: 110 112 106   Temp:      TempSrc:      Resp: PainSc:    0-No pain    Chest clear Heart RRR without murmur Abd gravid, soft, obese, NT Pelvic: Placenta in-situ, no lacerations   Ext: 1+ calf/pedal edema   Prenatal labs: ABO, Rh: --/--/O POS (05/30 0015) Antibody: NEG (05/30 0015) Neg (08/28/14, 09/24/14) Rubella:  Immune (09/24/14) RPR:  NR (09/24/14) HBsAg:  Neg (09/24/14)  HIV:  NR (09/24/14)  GBS:  Unknown Sickle cell/Hgb electrophoresis: Normal study Pap: ASCU-S, HPV neg on 10/06/14 GC: Neg on 07/19/14 and 09/24/14 Chlamydia: 07/19/14 and 09/24/14 Genetic screenings: Neg first trimester  aneuploidy Risk Assessment on 09/20/14 Glucola: Not done Other: 55K multi-bacteria on NOB urine culture Hgb 13.2 on 07/19/14 and 13.9 on 08/28/14   [place] on 11/11/14: Breech, anhydramnios, cervix 3.38 cm.        Assessment: IUFD at 21.1 wks Morbidly obese Placenta in-situ H/O anxiety and depression (diagnosed in 2013 - no meds)   Plan: Admit to [facility] Nursing Unit per consult with Dr. Charlotta Newton. Cytotec 600 mcg per rectum. NPO. LR 125 ml/hr. Chaplain services as desired. Support to couple for loss.    Sherre Scarlet CNM, MS 11/12/14, 6:53 AM

## 2014-11-12 NOTE — MAU Note (Signed)
Pt requested baby to be taken away at this time. Several family members came in to see the baby. Patient spent time holding and kissing the baby. AC came to take the baby to the comfort room for measurements and pictures and for a memory box to be started.

## 2014-11-13 LAB — CULTURE, BETA STREP (GROUP B ONLY)

## 2014-11-13 LAB — CBC
HEMATOCRIT: 33 % — AB (ref 36.0–46.0)
Hemoglobin: 11.3 g/dL — ABNORMAL LOW (ref 12.0–15.0)
MCH: 29.7 pg (ref 26.0–34.0)
MCHC: 34.2 g/dL (ref 30.0–36.0)
MCV: 86.8 fL (ref 78.0–100.0)
PLATELETS: 187 10*3/uL (ref 150–400)
RBC: 3.8 MIL/uL — AB (ref 3.87–5.11)
RDW: 14.2 % (ref 11.5–15.5)
WBC: 9.1 10*3/uL (ref 4.0–10.5)

## 2014-11-13 LAB — RPR: RPR Ser Ql: NONREACTIVE

## 2014-11-13 MED ORDER — OXYCODONE-ACETAMINOPHEN 5-325 MG PO TABS: 1.0000 | ORAL_TABLET | Freq: Four times a day (QID) | ORAL | Status: DC | PRN

## 2014-11-13 NOTE — Progress Notes (Signed)

## 2014-11-13 NOTE — Discharge Instructions (Signed)
Call for any fever of 100.4 or greater, heavy bleeding, severe pain unrelieved by medication, dizziness or fainting, leg pain, severe swelling, or any other issues. Call for any concerns or questions.

## 2014-11-13 NOTE — Plan of Care (Signed)
Problem: Phase I Progression Outcomes Goal: Pain controlled with appropriate interventions Outcome: Completed/Met Date Met:  11/13/14 Good pain control on po Percocet. Goal: Initial discharge plan identified Outcome: Completed/Met Date Met:  11/13/14 VSS Pain control Bleeding WNL Comfort care Understand self care and f/u care  Problem: Phase II Progression Outcomes Goal: Progress activity as tolerated unless otherwise ordered Outcome: Completed/Met Date Met:  11/13/14 Tolerates being up in room without any problems.

## 2014-11-13 NOTE — Progress Notes (Signed)
Placenta to path for analysis.   Sherre Scarlet, CNM 11/12/14, 08:00 AM

## 2014-11-13 NOTE — Plan of Care (Signed)
Problem: Discharge Progression Outcomes Goal: Barriers To Progression Addressed/Resolved Outcome: Completed/Met Date Met:  11/13/14 +flatus and BM Goal: Complications resolved/controlled Outcome: Completed/Met Date Met:  11/13/14 Had BM

## 2014-11-13 NOTE — Discharge Summary (Signed)
  Vaginal Delivery Discharge Summary  Traci Peterson  DOB:    1985-07-19 MRN:    [id number] CSN:    [id number]  Date of admission:                  2014/11/29  Date of discharge:                   11/13/14  Procedures this admission:   S/P vaginal delivery at home of stillborn at 80 1/7 weeks  Date of Delivery: 2014-11-29  Newborn Data:  Live born female  Birth Weight: 13.2 oz (374 g) APGAR: 0, 0    History of Present Illness:  [name] is a 29 y.o. female, G1P0100, who presents at [redacted]w[redacted]d weeks gestation. The patient has been followed at [facility] and Gynecology division [facility]. She was admitted for delivery at home of 21 1/7 week stillborn, s/p PPROM on 10/24/14. Her pregnancy has been complicated by:  Patient Active Problem List   Diagnosis Date Noted  . IUFD (intrauterine fetal death) 11-29-2014  . Gonorrhea complicating pregnancy in first trimester--dx 3/1, neg TOC 09/24/14 11/11/2014  . BV (bacterial vaginosis) 10/25/2014  . Morbidly obese 10/25/2014  . Eczema 10/25/2014  . 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 Chronic headaches -  no meds 10/25/2014  . Anxiety and depression 11/30/2013  . Tobacco abuse 05/25/2013  . Atypical squamous cells of undetermined significance with positive high risk human papillomavirus 05/08/2013  . PCOS (polycystic ovarian syndrome) 04/20/2013     Hospital Course:   Admitting Dx:   IUP at 21 1/7 weeks, S/p SROM on 5/29 at 18 4/7 weeks, delivery at home of stillborn female. GBS Status:  Neg Delivering Clinician: Sherre Scarlet, CNM Lacerations/MLE: None Complications: None  Intrapartum Procedures: spontaneous vaginal delivery Postpartum Procedures: none Complications-Operative and Postpartum: none  Discharge Diagnoses: Precipitous home delivery of non-viable female at 72 1/7 weeks. S/p PPROM on  10/24/14.   Contraception:  Considering options, unsure of final plan.  Hemoglobin Results:  CBC Latest Ref Rng 11/13/2014 11/11/2014 10/25/2014  WBC 4.0 - 10.5 K/uL 9.1 10.4 9.4  Hemoglobin 12.0 - 15.0 g/dL 11.3(L) 12.3 12.6  Hematocrit 36.0 - 46.0 % 33.0(L) 35.5(L) 35.6(L)  Platelets 150 - 400 K/uL 187 200 212    Discharge Physical Exam:   General: alert Lochia: appropriate Uterine Fundus: firm Incision: Intact perineum DVT Evaluation: No evidence of DVT seen on physical exam. Negative Homan's sign.   Discharge Information:  Activity:           pelvic rest Diet:                routine Medications: Percocet Condition:      stable Instructions:  Routine pp instructions Has supportive spouse, family, and friends.   Discharge to: home  Follow-up Information    Follow up with [facility]. Schedule an appointment as soon as possible for a visit in 6 weeks.   Specialty:  Obstetrics and Gynecology   Why:  We will call you in 1-2 weeks to check on your status.  We will plan to see you at 6 weeks at the office.   Contact information:   [address] [place] [address] [phone]       Nigel Bridgeman CNM 11/13/2014 11:11 AM

## 2014-11-20 ENCOUNTER — Encounter (HOSPITAL_COMMUNITY): Payer: Self-pay | Admitting: Emergency Medicine

## 2014-11-20 ENCOUNTER — Emergency Department (INDEPENDENT_AMBULATORY_CARE_PROVIDER_SITE_OTHER)
Admission: EM | Admit: 2014-11-20 | Discharge: 2014-11-20 | Disposition: A | Discharge status: Home / Self Care | Payer: Medicaid Other | Source: Home / Self Care | Attending: Family Medicine | Admitting: Family Medicine

## 2014-11-20 DIAGNOSIS — H6091 Unspecified otitis externa, right ear: Secondary | ICD-10-CM | POA: Diagnosis not present

## 2014-11-20 DIAGNOSIS — A084 Viral intestinal infection, unspecified: Secondary | ICD-10-CM | POA: Diagnosis not present

## 2014-11-20 MED ORDER — NEOMYCIN-POLYMYXIN-HC 3.5-10000-1 OT SUSP: 4.0000 [drp] | Freq: Three times a day (TID) | OTIC | Status: DC

## 2014-11-20 NOTE — ED Notes (Signed)
Patient comes in with c/o diarrhea since last night, decreased appetite, denies fever,chills,n,v C/o right ear pain with chewing and movement, tried peroxide rinse Still born birth 11/12/14 while at home, pt was 5 month pregnant

## 2014-11-20 NOTE — Discharge Instructions (Signed)
Use ear drops as prescribed for 5-7 days, return as needed, use clear liquid diet and probiotic for diarrhea, see your doctor if further problems.

## 2014-11-20 NOTE — ED Provider Notes (Signed)
CSN: [id number]     Arrival date & time 11/20/14  1715 History   First MD Initiated Contact with Patient 11/20/14 1742     Chief Complaint  Patient presents with  . Otalgia  . Diarrhea   (Consider location/radiation/quality/duration/timing/severity/associated sxs/prior Treatment) Patient is a 29 y.o. female presenting with ear pain and diarrhea. The history is provided by the patient.  Otalgia Location:  Right Behind ear:  No abnormality Quality:  Sharp and throbbing Severity:  Mild Duration:  2 days Progression:  Unchanged Chronicity:  New Context comment:  Spont onset Associated symptoms: diarrhea   Associated symptoms: no fever and no vomiting   Diarrhea Quality:  Watery Severity:  Mild Onset quality:  Sudden Progression:  Unchanged Associated symptoms: no fever and no vomiting   Associated symptoms comment:  Started with breast milk last eve also after stilborn child on 6/17.   Past Medical History  Diagnosis Date  . Anxiety     no current med.  . Pilonidal cyst 02/2013  . Jaw snapping     states jaw pops if opens mouth too wide  . Menstrual bleeding problem     menses from 12/22/2012 - current (03/06/2013)  . Chronic headaches   . Ovarian cyst   . [redacted] weeks gestation of pregnancy    Past Surgical History  Procedure Laterality Date  . Wisdom tooth extraction    . Pilonidal cyst excision N/A 03/12/2013    Procedure: CYST EXCISION PILONIDAL EXTENSIVE;  Surgeon: Shelly Rubenstein, MD;  Location: [facility];  Service: General;  Laterality: N/A;   Family History  Problem Relation Age of Onset  . Hypertension Mother   . Hyperlipidemia Mother   . Diabetes Mother   . Thyroid disease Mother   . Diabetes Father   . Hypertension Father   . Alcohol abuse Father   . Breast cancer Maternal Aunt   . Lung cancer Paternal Grandfather    History  Substance Use Topics  . Smoking status: Former Smoker -- 0.25 packs/day for 6 years    Types: Cigarettes     Quit date: 06/27/2013  . Smokeless tobacco: Never Used     Comment: 3-5 cig./day  . Alcohol Use: No     Comment: socially   OB History    Gravida Para Term Preterm AB TAB SAB Ectopic Multiple Living   1 1  1      0 0     Review of Systems  Constitutional: Negative.  Negative for fever.  HENT: Positive for ear pain.   Gastrointestinal: Positive for diarrhea. Negative for nausea and vomiting.    Allergies  Orange syrup  Home Medications   Prior to Admission medications   Medication Sig Start Date End Date Taking? Authorizing Provider  ibuprofen (ADVIL,MOTRIN) 600 MG tablet Take 600 mg by mouth every 6 (six) hours as needed.   Yes Historical Provider, MD  neomycin-polymyxin-hydrocortisone (CORTISPORIN) 3.5-10000-1 otic suspension Place 4 drops into the right ear 3 (three) times daily. 11/20/14   Linna Hoff, MD  oxyCODONE-acetaminophen (PERCOCET/ROXICET) 5-325 MG per tablet Take 1 tablet by mouth every 6 (six) hours as needed (for pain scale 4-7). 11/13/14   Nigel Bridgeman, CNM   BP 126/82 mmHg  Pulse 81  Temp(Src) 98.8 F (37.1 C) (Oral)  Resp 18  SpO2 95%  LMP 06/17/2014 Physical Exam  Constitutional: She is oriented to person, place, and time. She appears well-developed and well-nourished. No distress.  HENT:  Right Ear: There is  tenderness. No drainage or swelling. No mastoid tenderness. Tympanic membrane is not injected and not erythematous. No middle ear effusion. No decreased hearing is noted.  Left Ear: Tympanic membrane, external ear and ear canal normal.  Mouth/Throat: Oropharynx is clear and moist.  Neck: Normal range of motion. Neck supple.  Abdominal: Soft. Bowel sounds are normal. There is no tenderness.  Lymphadenopathy:    She has no cervical adenopathy.  Neurological: She is alert and oriented to person, place, and time.  Skin: Skin is warm and dry.  Nursing note and vitals reviewed.   ED Course  Procedures (including critical care time) Labs  Review Labs Reviewed - No data to display  Imaging Review No results found.   MDM   1. Otitis externa of right ear   2. Viral diarrhea       Linna Hoff, MD 11/20/14 [phone]

## 2015-03-26 ENCOUNTER — Encounter (HOSPITAL_COMMUNITY): Payer: Self-pay | Admitting: *Deleted

## 2015-03-26 ENCOUNTER — Inpatient Hospital Stay (HOSPITAL_COMMUNITY): Payer: Medicaid Other

## 2015-03-26 ENCOUNTER — Inpatient Hospital Stay (HOSPITAL_COMMUNITY)
Admission: EM | Admit: 2015-03-26 | Discharge: 2015-03-27 | Disposition: A | Discharge status: Home / Self Care | Payer: Medicaid Other | Source: Ambulatory Visit | Attending: Obstetrics and Gynecology | Admitting: Obstetrics and Gynecology

## 2015-03-26 DIAGNOSIS — B9689 Other specified bacterial agents as the cause of diseases classified elsewhere: Secondary | ICD-10-CM | POA: Insufficient documentation

## 2015-03-26 DIAGNOSIS — R1030 Lower abdominal pain, unspecified: Secondary | ICD-10-CM | POA: Insufficient documentation

## 2015-03-26 DIAGNOSIS — Z87891 Personal history of nicotine dependence: Secondary | ICD-10-CM | POA: Diagnosis not present

## 2015-03-26 DIAGNOSIS — N760 Acute vaginitis: Secondary | ICD-10-CM | POA: Insufficient documentation

## 2015-03-26 DIAGNOSIS — T8332XA Displacement of intrauterine contraceptive device, initial encounter: Secondary | ICD-10-CM

## 2015-03-26 DIAGNOSIS — R1032 Left lower quadrant pain: Secondary | ICD-10-CM

## 2015-03-26 DIAGNOSIS — R109 Unspecified abdominal pain: Secondary | ICD-10-CM

## 2015-03-26 LAB — URINALYSIS, ROUTINE W REFLEX MICROSCOPIC
BILIRUBIN URINE: NEGATIVE
GLUCOSE, UA: NEGATIVE mg/dL
Hgb urine dipstick: NEGATIVE
KETONES UR: NEGATIVE mg/dL
Leukocytes, UA: NEGATIVE
Nitrite: NEGATIVE
Protein, ur: NEGATIVE mg/dL
Specific Gravity, Urine: 1.025 (ref 1.005–1.030)
Urobilinogen, UA: 1 mg/dL (ref 0.0–1.0)
pH: 6.5 (ref 5.0–8.0)

## 2015-03-26 LAB — POCT PREGNANCY, URINE: Preg Test, Ur: NEGATIVE

## 2015-03-26 NOTE — MAU Note (Signed)
Pt states she has been having severe lower abd pain all day today. Denies dysuria, denies nausea, vomiting or diarrhea.

## 2015-03-26 NOTE — MAU Provider Note (Signed)
History  29 yo non pregnant female presents unannounced to MAU w/ c/o acute left lower abdominal pain that started today, and has become progressively worse throughout the course of the day. Had IUD placed a few months ago. Reports heavy vaginal bleeding at times, none today.   Patient Active Problem List   Diagnosis Date Noted  . Malpositioned IUD (HCC) 03/26/2015  . Abdominal pain, acute, left lower quadrant 03/26/2015  . IUFD (intrauterine fetal death) 2014-12-08  . Gonorrhea complicating pregnancy in first trimester--dx 3/1, neg TOC 09/24/14 11/11/2014  . BV (bacterial vaginosis) 10/25/2014  . Morbidly obese (HCC) 10/25/2014  . Eczema 10/25/2014  . 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 Chronic headaches -  no meds 10/25/2014  . Anxiety and depression 11/30/2013  . Tobacco abuse 05/25/2013  . Atypical squamous cells of undetermined significance with positive high risk human papillomavirus 05/08/2013  . PCOS (polycystic ovarian syndrome) 04/20/2013    No chief complaint on file.  HPI As above OB History    Gravida Para Term Preterm AB TAB SAB Ectopic Multiple Living   0 0      Past Medical History  Diagnosis Date  . Anxiety     no current med.  . Pilonidal cyst 02/2013  . Jaw snapping     states jaw pops if opens mouth too wide  . Menstrual bleeding problem     menses from 12/22/2012 - current (03/06/2013)  . Chronic headaches   . Ovarian cyst   . [redacted] weeks gestation of pregnancy     Past Surgical History  Procedure Laterality Date  . Wisdom tooth extraction    . Pilonidal cyst excision N/A 03/12/2013    Procedure: CYST EXCISION PILONIDAL EXTENSIVE;  Surgeon: Shelly Rubenstein, MD;  Location: [facility];  Service: General;  Laterality: N/A;    Family History  Problem Relation Age of Onset  . Hypertension Mother   . Hyperlipidemia Mother   . Diabetes  Mother   . Thyroid disease Mother   . Diabetes Father   . Hypertension Father   . Alcohol abuse Father   . Breast cancer Maternal Aunt   . Lung cancer Paternal Grandfather     Social History  Substance Use Topics  . Smoking status: Former Smoker -- 0.25 packs/day for 6 years    Types: Cigarettes    Quit date: 06/27/2013  . Smokeless tobacco: Never Used     Comment: 3-5 cig./day  . Alcohol Use: No     Comment: socially    Allergies:  Allergies  Allergen Reactions  . Orange Syrup Hives and Shortness Of Breath    ORANGE DYE    No prescriptions prior to admission    ROS  LL abdominal pain Physical Exam  CLINICAL DATA: Acute onset of generalized abdominal pain. Assess intrauterine device. Initial encounter.  EXAM: TRANSABDOMINAL AND TRANSVAGINAL ULTRASOUND OF PELVIS  TECHNIQUE: Both transabdominal and transvaginal ultrasound examinations of the pelvis were performed. Transabdominal technique was performed for global imaging of the pelvis including uterus, ovaries, adnexal regions, and pelvic cul-de-sac. It was necessary to proceed with endovaginal exam following the transabdominal exam to visualize the uterus and ovaries in greater detail.  COMPARISON: None  FINDINGS: Uterus  Measurements: 8.2 x 4.2 x 5.4 cm. No fibroids or other mass visualized. The intrauterine device is at the lower uterine segment, and  malrotated, with one of the prongs extending into the myometrium.  Endometrium  Thickness: 0.6 cm. No focal abnormality visualized.  Right ovary  Measurements: 3.7 x 2.6 x 3.2 cm. Normal appearance/no adnexal mass.  Left ovary  Measurements: 2.7 x 2.3 x 2.3 cm. Normal appearance/no adnexal mass.  Other findings  A small amount of free fluid is seen within the pelvic cul-de-sac.  IMPRESSION: 1. Intrauterine device is noted at the lower uterine segment, and malrotated, with one of the prongs extending into the myometrium. 2.  Otherwise unremarkable pelvic ultrasound. No evidence for ovarian torsion.  These results were called by telephone at the time of interpretation on 03/27/2015 at 12:23 am to [facility] Breanna Shorkey CNM, who verbally acknowledged these results.   Electronically Signed  By: Roanna RaiderJeffery Chang M.D.  On: 03/27/2015 00:23  Results for orders placed or performed during the hospital encounter of 03/26/15 (from the past 48 hour(s))  Urinalysis, Routine w reflex microscopic (not [facility])     Status: Abnormal   Collection Time: 03/26/15 10:19 PM  Result Value Ref Range   Color, Urine YELLOW YELLOW   APPearance HAZY (A) CLEAR   Specific Gravity, Urine 1.025 1.005 - 1.030   pH 6.5 5.0 - 8.0   Glucose, UA NEGATIVE NEGATIVE mg/dL   Hgb urine dipstick NEGATIVE NEGATIVE   Bilirubin Urine NEGATIVE NEGATIVE   Ketones, ur NEGATIVE NEGATIVE mg/dL   Protein, ur NEGATIVE NEGATIVE mg/dL   Urobilinogen, UA 1.0 0.0 - 1.0 mg/dL   Nitrite NEGATIVE NEGATIVE   Leukocytes, UA NEGATIVE NEGATIVE    Comment: MICROSCOPIC NOT DONE ON URINES WITH NEGATIVE PROTEIN, BLOOD, LEUKOCYTES, NITRITE, OR GLUCOSE <1000 mg/dL.  Pregnancy, urine POC     Status: None   Collection Time: 03/26/15 10:28 PM  Result Value Ref Range   Preg Test, Ur NEGATIVE NEGATIVE    Comment:        THE SENSITIVITY OF THIS METHODOLOGY IS >24 mIU/mL   Wet prep, genital     Status: Abnormal   Collection Time: 03/27/15  2:29 AM  Result Value Ref Range   Yeast Wet Prep HPF POC NONE SEEN NONE SEEN   Trich, Wet Prep NONE SEEN NONE SEEN   Clue Cells Wet Prep HPF POC FEW (A) NONE SEEN   WBC, Wet Prep HPF POC FEW (A) NONE SEEN    Comment: MODERATE BACTERIA SEEN   Blood pressure 129/84, pulse 86, temperature 98.1 F (36.7 C), temperature source Oral, resp. rate 18, last menstrual period 03/07/2015, SpO2 98 %.   Physical Exam Gen: NAD Lungs: CTAB CV: RRR w/o M/R/G Abdomen: soft, ttp on left, no rebound or guarding Pelvic: IUD strings visible and  grasped w/ ring forcep; IUD intact. Malodorous discharge present. Wet prep collected by Dr. Su Hiltoberts. ED Course  Pelvic u/s UPT UA Ibuprofen  Assessment: Malpositioned IUD, pt desires removal BV  Plan: Begin OCPs ASAP --- instructed to use back up method. Office visit next week to follow up on abdominal pain with Dr. Su Hiltoberts. Will have office contact pt to treat BV. Pain medication.   Verdean Murin CNM, MS 03/27/15, 2:30 AM

## 2015-03-27 LAB — WET PREP, GENITAL
Trich, Wet Prep: NONE SEEN
Yeast Wet Prep HPF POC: NONE SEEN

## 2015-03-27 MED ORDER — IBUPROFEN 600 MG PO TABS: 600.0000 mg | ORAL_TABLET | Freq: Four times a day (QID) | ORAL | Status: DC | PRN

## 2015-03-27 MED ORDER — IBUPROFEN 800 MG PO TABS
800.0000 mg | ORAL_TABLET | Freq: Once | ORAL | Status: AC
  Administered 2015-03-27: 800 mg via ORAL
  Filled 2015-03-27: qty 1

## 2015-03-27 MED ORDER — NORGESTIMATE-ETH ESTRADIOL 0.25-35 MG-MCG PO TABS: 1.0000 | ORAL_TABLET | Freq: Every day | ORAL | Status: DC

## 2015-03-27 MED ORDER — HYDROCODONE-ACETAMINOPHEN 5-300 MG PO TABS: 1.0000 | ORAL_TABLET | ORAL | Status: DC | PRN

## 2015-03-27 NOTE — Discharge Instructions (Signed)

## 2015-04-19 ENCOUNTER — Encounter (HOSPITAL_BASED_OUTPATIENT_CLINIC_OR_DEPARTMENT_OTHER): Payer: Self-pay | Admitting: Emergency Medicine

## 2015-04-19 ENCOUNTER — Emergency Department (HOSPITAL_BASED_OUTPATIENT_CLINIC_OR_DEPARTMENT_OTHER)
Admission: EM | Admit: 2015-04-19 | Discharge: 2015-04-19 | Disposition: A | Discharge status: Home / Self Care | Payer: Medicaid Other | Attending: Emergency Medicine | Admitting: Emergency Medicine

## 2015-04-19 ENCOUNTER — Emergency Department (HOSPITAL_BASED_OUTPATIENT_CLINIC_OR_DEPARTMENT_OTHER): Payer: Medicaid Other

## 2015-04-19 DIAGNOSIS — Z87891 Personal history of nicotine dependence: Secondary | ICD-10-CM | POA: Diagnosis not present

## 2015-04-19 DIAGNOSIS — J069 Acute upper respiratory infection, unspecified: Secondary | ICD-10-CM | POA: Insufficient documentation

## 2015-04-19 DIAGNOSIS — Z8659 Personal history of other mental and behavioral disorders: Secondary | ICD-10-CM | POA: Diagnosis not present

## 2015-04-19 DIAGNOSIS — Z793 Long term (current) use of hormonal contraceptives: Secondary | ICD-10-CM | POA: Insufficient documentation

## 2015-04-19 DIAGNOSIS — G8929 Other chronic pain: Secondary | ICD-10-CM | POA: Diagnosis not present

## 2015-04-19 DIAGNOSIS — Z8742 Personal history of other diseases of the female genital tract: Secondary | ICD-10-CM | POA: Diagnosis not present

## 2015-04-19 DIAGNOSIS — Z872 Personal history of diseases of the skin and subcutaneous tissue: Secondary | ICD-10-CM | POA: Insufficient documentation

## 2015-04-19 DIAGNOSIS — Z792 Long term (current) use of antibiotics: Secondary | ICD-10-CM | POA: Diagnosis not present

## 2015-04-19 DIAGNOSIS — R079 Chest pain, unspecified: Secondary | ICD-10-CM | POA: Diagnosis present

## 2015-04-19 LAB — RAPID STREP SCREEN (MED CTR MEBANE ONLY): Streptococcus, Group A Screen (Direct): NEGATIVE

## 2015-04-19 MED ORDER — DEXAMETHASONE 4 MG PO TABS
10.0000 mg | ORAL_TABLET | Freq: Once | ORAL | Status: AC
  Administered 2015-04-19: 10 mg via ORAL

## 2015-04-19 MED ORDER — DEXAMETHASONE 4 MG PO TABS
ORAL_TABLET | ORAL | Status: AC
  Filled 2015-04-19: qty 3

## 2015-04-19 NOTE — ED Notes (Signed)
Patient states she started having chest pain on Sunday and it has not stopped.  She states she has a cough, congestion and hoarseness.  She denies nausea, vomiting or fever.

## 2015-04-19 NOTE — Discharge Instructions (Signed)
Upper Respiratory Infection, Adult Most upper respiratory infections (URIs) are a viral infection of the air passages leading to the lungs. A URI affects the nose, throat, and upper air passages. The most common type of URI is nasopharyngitis and is typically referred to as "the common cold." URIs run their course and usually go away on their own. Most of the time, a URI does not require medical attention, but sometimes a bacterial infection in the upper airways can follow a viral infection. This is called a secondary infection. Sinus and middle ear infections are common types of secondary upper respiratory infections. Bacterial pneumonia can also complicate a URI. A URI can worsen asthma and chronic obstructive pulmonary disease (COPD). Sometimes, these complications can require emergency medical care and may be life threatening.  CAUSES Almost all URIs are caused by viruses. A virus is a type of germ and can spread from one person to another.  RISKS FACTORS You may be at risk for a URI if:   You smoke.   You have chronic heart or lung disease.  You have a weakened defense (immune) system.   You are very young or very old.   You have nasal allergies or asthma.  You work in crowded or poorly ventilated areas.  You work in health care facilities or schools. SIGNS AND SYMPTOMS  Symptoms typically develop 2-3 days after you come in contact with a cold virus. Most viral URIs last 7-10 days. However, viral URIs from the influenza virus (flu virus) can last 14-18 days and are typically more severe. Symptoms may include:   Runny or stuffy (congested) nose.   Sneezing.   Cough.   Sore throat.   Headache.   Fatigue.   Fever.   Loss of appetite.   Pain in your forehead, behind your eyes, and over your cheekbones (sinus pain).  Muscle aches.  DIAGNOSIS  Your health care provider may diagnose a URI by:  Physical exam.  Tests to check that your symptoms are not due to  another condition such as:  Strep throat.  Sinusitis.  Pneumonia.  Asthma. TREATMENT  A URI goes away on its own with time. It cannot be cured with medicines, but medicines may be prescribed or recommended to relieve symptoms. Medicines may help:  Reduce your fever.  Reduce your cough.  Relieve nasal congestion. HOME CARE INSTRUCTIONS   Take medicines only as directed by your health care provider.   Gargle warm saltwater or take cough drops to comfort your throat as directed by your health care provider.  Use a warm mist humidifier or inhale steam from a shower to increase air moisture. This may make it easier to breathe.  Drink enough fluid to keep your urine clear or pale yellow.   Eat soups and other clear broths and maintain good nutrition.   Rest as needed.   Return to work when your temperature has returned to normal or as your health care provider advises. You may need to stay home longer to avoid infecting others. You can also use a face mask and careful hand washing to prevent spread of the virus.  Increase the usage of your inhaler if you have asthma.   Do not use any tobacco products, including cigarettes, chewing tobacco, or electronic cigarettes. If you need help quitting, ask your health care provider. PREVENTION  The best way to protect yourself from getting a cold is to practice good hygiene.   Avoid oral or hand contact with people with cold   symptoms.   Wash your hands often if contact occurs.  There is no clear evidence that vitamin C, vitamin E, echinacea, or exercise reduces the chance of developing a cold. However, it is always recommended to get plenty of rest, exercise, and practice good nutrition.  SEEK MEDICAL CARE IF:   You are getting worse rather than better.   Your symptoms are not controlled by medicine.   You have chills.  You have worsening shortness of breath.  You have brown or red mucus.  You have yellow or brown nasal  discharge.  You have pain in your face, especially when you bend forward.  You have a fever.  You have swollen neck glands.  You have pain while swallowing.  You have white areas in the back of your throat. SEEK IMMEDIATE MEDICAL CARE IF:   You have severe or persistent:  Headache.  Ear pain.  Sinus pain.  Chest pain.  You have chronic lung disease and any of the following:  Wheezing.  Prolonged cough.  Coughing up blood.  A change in your usual mucus.  You have a stiff neck.  You have changes in your:  Vision.  Hearing.  Thinking.  Mood. MAKE SURE YOU:   Understand these instructions.  Will watch your condition.  Will get help right away if you are not doing well or get worse.   This information is not intended to replace advice given to you by your health care provider. Make sure you discuss any questions you have with your health care provider.   Document Released: 11/07/2000 Document Revised: 09/28/2014 Document Reviewed: 08/19/2013 Elsevier Interactive Patient Education 2016 [employer]  

## 2015-04-19 NOTE — ED Provider Notes (Signed)
CSN: [id number]     Arrival date & time 04/19/15  1938 History  By signing my name below, I, Octavia Heir, attest that this documentation has been prepared under the direction and in the presence of Tilden Fossa, MD. Electronically Signed: Octavia Heir, ED Scribe. 04/19/2015. 9:26 PM.    Chief Complaint  Patient presents with  . Chest Pain      The history is provided by the patient. No language interpreter was used.   HPI Comments: Traci Peterson is a 29 y.o. female who presents to the Emergency Department complaining of constant, moderate, gradual worsening chest pain onset 3 days ago. She has been having associated generalized body aches, sore throat, fever and slight cough with yellow sputum. Pt states she took an aspirin on Sunday and notes losing her voice shortly after. Pt endorses increased pain when taking a deep breath. Pt notes she just started a new birth control and states one of the side effects was chest pain. She denies vomiting, nausea, leg swelling, hx of DM, hx of HTN.  Past Medical History  Diagnosis Date  . Anxiety     no current med.  . Pilonidal cyst 02/2013  . Jaw snapping     states jaw pops if opens mouth too wide  . Menstrual bleeding problem     menses from 12/22/2012 - current (03/06/2013)  . Chronic headaches   . Ovarian cyst   . [redacted] weeks gestation of pregnancy    Past Surgical History  Procedure Laterality Date  . Wisdom tooth extraction    . Pilonidal cyst excision N/A 03/12/2013    Procedure: CYST EXCISION PILONIDAL EXTENSIVE;  Surgeon: Shelly Rubenstein, MD;  Location: [facility];  Service: General;  Laterality: N/A;   Family History  Problem Relation Age of Onset  . Hypertension Mother   . Hyperlipidemia Mother   . Diabetes Mother   . Thyroid disease Mother   . Diabetes Father   . Hypertension Father   . Alcohol abuse Father   . Breast cancer Maternal Aunt   . Lung cancer Paternal Grandfather    Social History   Substance Use Topics  . Smoking status: Former Smoker -- 0.25 packs/day for 6 years    Types: Cigarettes    Quit date: 06/27/2013  . Smokeless tobacco: Never Used     Comment: 3-5 cig./day  . Alcohol Use: No     Comment: socially   OB History    Gravida Para Term Preterm AB TAB SAB Ectopic Multiple Living   0 0     Review of Systems  Constitutional: Positive for fever.  HENT: Positive for sore throat.   Respiratory: Positive for cough.   Cardiovascular: Positive for chest pain.  Gastrointestinal: Negative for nausea and vomiting.  All other systems reviewed and are negative.     Allergies  Orange syrup  Home Medications   Prior to Admission medications   Medication Sig Start Date End Date Taking? Authorizing Provider  Hydrocodone-Acetaminophen 5-300 MG TABS Take 1 tablet by mouth every 4 (four) hours as needed. 03/27/15  Yes Sherre Scarlet, CNM  ibuprofen (ADVIL,MOTRIN) 600 MG tablet Take 1 tablet (600 mg total) by mouth every 6 (six) hours as needed for fever, headache, mild pain, moderate pain or cramping. 03/27/15  Yes Sherre Scarlet, CNM  norgestimate-ethinyl estradiol (ORTHO-CYCLEN, 28,) 0.25-35 MG-MCG tablet Take 1 tablet by mouth daily. Begin 03/27/15 03/27/15  Yes Sherre Scarlet, CNM  neomycin-polymyxin-hydrocortisone (CORTISPORIN) 3.5-10000-1 otic suspension Place 4 drops into the right ear 3 (three) times daily. 11/20/14   Linna HoffJames D Kindl, MD   Triage vitals: BP 133/72 mmHg  Pulse 104  Temp(Src) 99.3 F (37.4 C) (Oral)  Resp 22  Ht 5\' 2"  (1.575 m)  Wt 261 lb (118.389 kg)  BMI 47.73 kg/m2  SpO2 98%  LMP 04/17/2015 (Exact Date) Physical Exam  Constitutional: She is oriented to person, place, and time. She appears well-developed and well-nourished.  HENT:  Head: Normocephalic and atraumatic.  Mild erythema in posterior pharynx  Cardiovascular: Normal rate and regular rhythm.   No murmur heard. Pulmonary/Chest: Effort normal and breath  sounds normal. No respiratory distress.  Abdominal: Soft. There is no tenderness. There is no rebound and no guarding.  Musculoskeletal: She exhibits no edema or tenderness.  Neurological: She is alert and oriented to person, place, and time.  Skin: Skin is warm and dry.  Psychiatric: She has a normal mood and affect. Her behavior is normal.  Nursing note and vitals reviewed.   ED Course  Procedures  DIAGNOSTIC STUDIES: Oxygen Saturation is 98% on RA, normal by my interpretation.  COORDINATION OF CARE: ' 9:24 PM Discussed treatment plan which includes strep test, EKG, and chest x-ray with pt at bedside and pt agreed to plan.  Labs Review Labs Reviewed  RAPID STREP SCREEN (NOT AT [facility])  CULTURE, GROUP A STREP    Imaging Review Dg Chest 2 View  04/19/2015  CLINICAL DATA:  Patient with chest pain for multiple days. EXAM: CHEST  2 VIEW COMPARISON:  Chest radiograph 08/08/2012. FINDINGS: Stable cardiac and mediastinal contours. No consolidative pulmonary opacities. No pleural effusion or pneumothorax. Regional skeleton is unremarkable. IMPRESSION: No active cardiopulmonary disease. Electronically Signed   By: Annia Beltrew  Davis M.D.   On: 04/19/2015 22:01   I have personally reviewed and evaluated these images and lab results as part of my medical decision-making.   EKG Interpretation   Date/Time:  Tuesday April 19 2015 21:33:33 EST Ventricular Rate:  86 PR Interval:  148 QRS Duration: 86 QT Interval:  368 QTC Calculation: 440 R Axis:   15 Text Interpretation:  Normal sinus rhythm Normal ECG Confirmed by Lincoln Brighamees,  Liz [phone]([id number]) on 04/19/2015 10:35:16 PM      MDM   Final diagnoses:  Acute URI   Patient here for evaluation of chest pain, sore throat, body aches, cough. She is nontoxic appearing on examination. Presentation is not consistent with ACS, PE, dissection, CHF, pneumonia. Discussed with patient findings of URI, treating with one-time dose of Decadron for sore throat.  Discussed outpatient follow-up as well as close return precautions.  I personally performed the services described in this documentation, which was scribed in my presence. The recorded information has been reviewed and is accurate.   Tilden FossaElizabeth Zerina Hallinan, MD 04/19/15 [phone]

## 2015-04-19 NOTE — ED Notes (Signed)
Patient transported to X-ray 

## 2015-04-22 LAB — CULTURE, GROUP A STREP: Strep A Culture: NEGATIVE

## 2015-05-27 ENCOUNTER — Emergency Department (HOSPITAL_BASED_OUTPATIENT_CLINIC_OR_DEPARTMENT_OTHER)
Admission: EM | Admit: 2015-05-27 | Discharge: 2015-05-27 | Disposition: A | Discharge status: Home / Self Care | Payer: Medicaid Other | Attending: Emergency Medicine | Admitting: Emergency Medicine

## 2015-05-27 ENCOUNTER — Encounter (HOSPITAL_BASED_OUTPATIENT_CLINIC_OR_DEPARTMENT_OTHER): Payer: Self-pay | Admitting: *Deleted

## 2015-05-27 DIAGNOSIS — Z8739 Personal history of other diseases of the musculoskeletal system and connective tissue: Secondary | ICD-10-CM | POA: Insufficient documentation

## 2015-05-27 DIAGNOSIS — J301 Allergic rhinitis due to pollen: Secondary | ICD-10-CM | POA: Diagnosis not present

## 2015-05-27 DIAGNOSIS — E860 Dehydration: Secondary | ICD-10-CM | POA: Diagnosis not present

## 2015-05-27 DIAGNOSIS — Z872 Personal history of diseases of the skin and subcutaneous tissue: Secondary | ICD-10-CM | POA: Insufficient documentation

## 2015-05-27 DIAGNOSIS — K130 Diseases of lips: Secondary | ICD-10-CM | POA: Diagnosis not present

## 2015-05-27 DIAGNOSIS — F17210 Nicotine dependence, cigarettes, uncomplicated: Secondary | ICD-10-CM | POA: Diagnosis not present

## 2015-05-27 DIAGNOSIS — Z793 Long term (current) use of hormonal contraceptives: Secondary | ICD-10-CM | POA: Diagnosis not present

## 2015-05-27 DIAGNOSIS — Z8659 Personal history of other mental and behavioral disorders: Secondary | ICD-10-CM | POA: Insufficient documentation

## 2015-05-27 DIAGNOSIS — J302 Other seasonal allergic rhinitis: Secondary | ICD-10-CM

## 2015-05-27 DIAGNOSIS — G8929 Other chronic pain: Secondary | ICD-10-CM | POA: Diagnosis not present

## 2015-05-27 DIAGNOSIS — Z8742 Personal history of other diseases of the female genital tract: Secondary | ICD-10-CM | POA: Insufficient documentation

## 2015-05-27 DIAGNOSIS — R220 Localized swelling, mass and lump, head: Secondary | ICD-10-CM | POA: Diagnosis present

## 2015-05-27 MED ORDER — DIPHENHYDRAMINE HCL 25 MG PO CAPS
50.0000 mg | ORAL_CAPSULE | Freq: Once | ORAL | Status: AC
  Administered 2015-05-27: 50 mg via ORAL
  Filled 2015-05-27: qty 2

## 2015-05-27 MED ORDER — LORATADINE 10 MG PO TABS: 10.0000 mg | ORAL_TABLET | Freq: Every day | ORAL | Status: DC

## 2015-05-27 NOTE — Discharge Instructions (Signed)

## 2015-05-27 NOTE — ED Notes (Signed)
Vaseline given to pt to put on lips

## 2015-05-27 NOTE — ED Notes (Addendum)
C/o tingling and swellling to face and mouth that started yesterday she took 2 benadryl yesterday. Today has itching around mouth and feels swollen. Also has nasal congestion, sneezing, nose running. Had lemonade v-8 splash Wednesday night.  And wonders if she is allergic to it. No other changes in diet or medications or products.

## 2015-05-27 NOTE — ED Provider Notes (Signed)
CSN: [id number]     Arrival date & time 05/27/15  7829 History   First MD Initiated Contact with Patient 05/27/15 [phone]     No chief complaint on file.    (Consider location/radiation/quality/duration/timing/severity/associated sxs/prior Treatment) Patient is a 29 y.o. female presenting with allergic reaction. The history is provided by the patient.  Allergic Reaction Presenting symptoms: itching and swelling (of lips and tingling)   Presenting symptoms: no difficulty swallowing   Itching:    Location:  Lips   Severity:  Moderate   Onset quality:  Gradual   Duration:  2 days   Timing:  Constant   Progression:  Worsening Swelling:    Timing:  Constant Severity:  Mild Prior episodes: drank a new beverage. Relieved by:  Antihistamines Worsened by:  Nothing tried Ineffective treatments:  None tried   Past Medical History  Diagnosis Date  . Anxiety     no current med.  . Pilonidal cyst 02/2013  . Jaw snapping     states jaw pops if opens mouth too wide  . Menstrual bleeding problem     menses from 12/22/2012 - current (03/06/2013)  . Chronic headaches   . Ovarian cyst   . [redacted] weeks gestation of pregnancy    Past Surgical History  Procedure Laterality Date  . Wisdom tooth extraction    . Pilonidal cyst excision N/A 03/12/2013    Procedure: CYST EXCISION PILONIDAL EXTENSIVE;  Surgeon: Shelly Rubenstein, MD;  Location: [facility];  Service: General;  Laterality: N/A;   Family History  Problem Relation Age of Onset  . Hypertension Mother   . Hyperlipidemia Mother   . Diabetes Mother   . Thyroid disease Mother   . Diabetes Father   . Hypertension Father   . Alcohol abuse Father   . Breast cancer Maternal Aunt   . Lung cancer Paternal Grandfather    Social History  Substance Use Topics  . Smoking status: Current Every Day Smoker -- 0.50 packs/day for 6 years    Types: Cigarettes    Last Attempt to Quit: 06/27/2013  . Smokeless tobacco: Never Used    Comment: 3-5 cig./day  . Alcohol Use: No     Comment: socially   OB History    Gravida Para Term Preterm AB TAB SAB Ectopic Multiple Living   0 0     Review of Systems  HENT: Negative for trouble swallowing.   Skin: Positive for itching.  All other systems reviewed and are negative.     Allergies  Orange syrup  Home Medications   Prior to Admission medications   Medication Sig Start Date End Date Taking? Authorizing Provider  norgestimate-ethinyl estradiol (ORTHO-CYCLEN, 28,) 0.25-35 MG-MCG tablet Take 1 tablet by mouth daily. Begin 03/27/15 03/27/15  Yes Sherre Scarlet, CNM   LMP 05/20/2015 Physical Exam  Constitutional: She is oriented to person, place, and time. She appears well-developed and well-nourished. No distress.  HENT:  Head: Normocephalic.  Mouth/Throat: Oropharynx is clear and moist and mucous membranes are normal. No oral lesions.  Lips appear dry and chapped  Eyes: Conjunctivae are normal.  Neck: Neck supple. No tracheal deviation present.  Cardiovascular: Normal rate, regular rhythm and normal heart sounds.   Pulmonary/Chest: Effort normal and breath sounds normal. No respiratory distress.  Abdominal: Soft. She exhibits no distension.  Neurological: She is alert and oriented to person, place, and time.  Skin: Skin is warm and dry.  Psychiatric:  She has a normal mood and affect.    ED Course  Procedures (including critical care time) Labs Review Labs Reviewed - No data to display  Imaging Review No results found. I have personally reviewed and evaluated these images and lab results as part of my medical decision-making.   EKG Interpretation None      MDM   Final diagnoses:  Seasonal allergies  Mild dehydration  Chapped lips    29 year old female presents with what she describes as lip swelling. They have been more itchy than usual. Lips appear chapped. Complaining of nasal congestion which is clinically consistent with an  allergic rhinitis. Has previously been on Claritin and tolerated well so we will restart on that. Initial provided for itching. Vaseline provided for chapped lips. Recommended oral rehydration for prevention.    Lyndal Pulleyaniel Chrisie Jankovich, MD 05/27/15 [phone]

## 2015-07-11 ENCOUNTER — Encounter (HOSPITAL_BASED_OUTPATIENT_CLINIC_OR_DEPARTMENT_OTHER): Payer: Self-pay | Admitting: *Deleted

## 2015-07-11 ENCOUNTER — Emergency Department (HOSPITAL_BASED_OUTPATIENT_CLINIC_OR_DEPARTMENT_OTHER): Payer: BLUE CROSS/BLUE SHIELD

## 2015-07-11 ENCOUNTER — Emergency Department (HOSPITAL_BASED_OUTPATIENT_CLINIC_OR_DEPARTMENT_OTHER)
Admission: EM | Admit: 2015-07-11 | Discharge: 2015-07-11 | Disposition: A | Discharge status: Home / Self Care | Payer: BLUE CROSS/BLUE SHIELD | Attending: Emergency Medicine | Admitting: Emergency Medicine

## 2015-07-11 DIAGNOSIS — N898 Other specified noninflammatory disorders of vagina: Secondary | ICD-10-CM | POA: Diagnosis not present

## 2015-07-11 DIAGNOSIS — K5900 Constipation, unspecified: Secondary | ICD-10-CM | POA: Diagnosis not present

## 2015-07-11 DIAGNOSIS — G8929 Other chronic pain: Secondary | ICD-10-CM | POA: Diagnosis not present

## 2015-07-11 DIAGNOSIS — Z3202 Encounter for pregnancy test, result negative: Secondary | ICD-10-CM | POA: Diagnosis not present

## 2015-07-11 DIAGNOSIS — Z8659 Personal history of other mental and behavioral disorders: Secondary | ICD-10-CM | POA: Diagnosis not present

## 2015-07-11 DIAGNOSIS — F17210 Nicotine dependence, cigarettes, uncomplicated: Secondary | ICD-10-CM | POA: Diagnosis not present

## 2015-07-11 DIAGNOSIS — R102 Pelvic and perineal pain: Secondary | ICD-10-CM | POA: Diagnosis present

## 2015-07-11 DIAGNOSIS — R1030 Lower abdominal pain, unspecified: Secondary | ICD-10-CM

## 2015-07-11 DIAGNOSIS — Z7251 High risk heterosexual behavior: Secondary | ICD-10-CM

## 2015-07-11 DIAGNOSIS — Z79899 Other long term (current) drug therapy: Secondary | ICD-10-CM | POA: Insufficient documentation

## 2015-07-11 DIAGNOSIS — N83202 Unspecified ovarian cyst, left side: Secondary | ICD-10-CM | POA: Diagnosis not present

## 2015-07-11 LAB — URINALYSIS, ROUTINE W REFLEX MICROSCOPIC
BILIRUBIN URINE: NEGATIVE
Glucose, UA: NEGATIVE mg/dL
Hgb urine dipstick: NEGATIVE
KETONES UR: NEGATIVE mg/dL
LEUKOCYTES UA: NEGATIVE
NITRITE: NEGATIVE
Protein, ur: NEGATIVE mg/dL
Specific Gravity, Urine: 1.025 (ref 1.005–1.030)
pH: 5.5 (ref 5.0–8.0)

## 2015-07-11 LAB — PREGNANCY, URINE: PREG TEST UR: NEGATIVE

## 2015-07-11 LAB — WET PREP, GENITAL
Clue Cells Wet Prep HPF POC: NONE SEEN
Sperm: NONE SEEN
TRICH WET PREP: NONE SEEN
Yeast Wet Prep HPF POC: NONE SEEN

## 2015-07-11 MED ORDER — AZITHROMYCIN 250 MG PO TABS
1000.0000 mg | ORAL_TABLET | Freq: Once | ORAL | Status: AC
  Administered 2015-07-11: 1000 mg via ORAL
  Filled 2015-07-11: qty 4

## 2015-07-11 MED ORDER — CEFTRIAXONE SODIUM 250 MG IJ SOLR
250.0000 mg | Freq: Once | INTRAMUSCULAR | Status: AC
  Administered 2015-07-11: 250 mg via INTRAMUSCULAR
  Filled 2015-07-11: qty 250

## 2015-07-11 MED ORDER — LIDOCAINE HCL (PF) 1 % IJ SOLN
INTRAMUSCULAR | Status: AC
  Administered 2015-07-11: 14:00:00
  Filled 2015-07-11: qty 5

## 2015-07-11 MED ORDER — NAPROXEN 500 MG PO TABS: 500.0000 mg | ORAL_TABLET | Freq: Two times a day (BID) | ORAL | Status: DC | PRN

## 2015-07-11 MED FILL — NAPROXEN 500 MG TABLET: 500 | 10 days supply | Qty: 20 | Fill #0

## 2015-07-11 NOTE — ED Notes (Signed)
Patient transported to Ultrasound 

## 2015-07-11 NOTE — ED Provider Notes (Signed)
CSN: [id number]     Arrival date & time 07/11/15  1027 History   First MD Initiated Contact with Patient 07/11/15 1141     Chief Complaint  Patient presents with  . Pelvic Pain     (Consider location/radiation/quality/duration/timing/severity/associated sxs/prior Treatment) HPI Comments: Traci Peterson is a 30 y.o. female with a PMHx of pilonidal cyst, anxiety, AUB, chronic headaches, ovarian cysts/PCOS, and anxiety, who presents to the ED with complaints of 2 weeks of pelvic pain. Patient describes the pain as 5/10 constant cramping in the suprapubic/lower abdominal area, nonradiating, with no known aggravating factors, and mildly improved with ibuprofen. She also states she has chronic constipation, last bowel movement was 2 days ago, states that this is normal for her. She reports that she has had pelvic pain like this in the past when she had ovarian cysts. Takes NSAIDs regularly. Sexually active with one female partner, unprotected. LMP was 05/23/15, states that she recently changed from Mirena to oral birth control around the time of her last period and has not had a menses since then. She has taken home pregnancy tests which were negative.  She denies any fevers, chills, chest pain, shortness breath, nausea, vomiting, diarrhea, obstipation, melena, hematochezia, dysuria, hematuria, vaginal bleeding or discharge, genital lesions, numbness, tingling, weakness, recent travel, sick contacts, suspicious food intake, alcohol use, or dyspareunia.  Patient is a 30 y.o. female presenting with pelvic pain. The history is provided by the patient. No language interpreter was used.  Pelvic Pain This is a new problem. The current episode started 1 to 4 weeks ago. The problem occurs constantly. The problem has been unchanged. Associated symptoms include abdominal pain (suprapubic). Pertinent negatives include no arthralgias, chest pain, chills, fever, myalgias, nausea, numbness, urinary symptoms, vomiting  or weakness. Nothing aggravates the symptoms. She has tried NSAIDs for the symptoms. The treatment provided mild relief.    Past Medical History  Diagnosis Date  . Anxiety     no current med.  . Pilonidal cyst 02/2013  . Jaw snapping     states jaw pops if opens mouth too wide  . Menstrual bleeding problem     menses from 12/22/2012 - current (03/06/2013)  . Chronic headaches   . Ovarian cyst   . [redacted] weeks gestation of pregnancy    Past Surgical History  Procedure Laterality Date  . Wisdom tooth extraction    . Pilonidal cyst excision N/A 03/12/2013    Procedure: CYST EXCISION PILONIDAL EXTENSIVE;  Surgeon: Shelly Rubenstein, MD;  Location: [facility];  Service: General;  Laterality: N/A;   Family History  Problem Relation Age of Onset  . Hypertension Mother   . Hyperlipidemia Mother   . Diabetes Mother   . Thyroid disease Mother   . Diabetes Father   . Hypertension Father   . Alcohol abuse Father   . Breast cancer Maternal Aunt   . Lung cancer Paternal Grandfather    Social History  Substance Use Topics  . Smoking status: Current Every Day Smoker -- 0.50 packs/day for 6 years    Types: Cigarettes    Last Attempt to Quit: 06/27/2013  . Smokeless tobacco: Never Used     Comment: 3-5 cig./day  . Alcohol Use: No     Comment: socially   OB History    Gravida Para Term Preterm AB TAB SAB Ectopic Multiple Living   1 1  1      0 0     Review of Systems  Constitutional:  Negative for fever and chills.  Respiratory: Negative for shortness of breath.   Cardiovascular: Negative for chest pain.  Gastrointestinal: Positive for abdominal pain (suprapubic) and constipation (no BM in 2 days, normal for her). Negative for nausea, vomiting, diarrhea and blood in stool.  Genitourinary: Positive for pelvic pain. Negative for dysuria, hematuria, vaginal bleeding, vaginal discharge, genital sores and dyspareunia.  Musculoskeletal: Negative for myalgias and arthralgias.    Skin: Negative for color change.  Allergic/Immunologic: Negative for immunocompromised state.  Neurological: Negative for weakness and numbness.  Psychiatric/Behavioral: Negative for confusion.   10 Systems reviewed and are negative for acute change except as noted in the HPI.    Allergies  Orange syrup  Home Medications   Prior to Admission medications   Medication Sig Start Date End Date Taking? Authorizing Provider  loratadine (CLARITIN) 10 MG tablet Take 1 tablet (10 mg total) by mouth daily. 05/27/15   Lyndal Pulley, MD  norgestimate-ethinyl estradiol (ORTHO-CYCLEN, 28,) 0.25-35 MG-MCG tablet Take 1 tablet by mouth daily. Begin 03/27/15 03/27/15   Sherre Scarlet, CNM   BP 117/71 mmHg  Pulse 104  Temp(Src) 98.2 F (36.8 C) (Oral)  Resp 18  SpO2 100%  LMP 05/28/2015 Physical Exam  Constitutional: She is oriented to person, place, and time. Vital signs are normal. She appears well-developed and well-nourished.  Non-toxic appearance. No distress.  Afebrile, nontoxic, NAD. Morbidly obese  HENT:  Head: Normocephalic and atraumatic.  Mouth/Throat: Oropharynx is clear and moist and mucous membranes are normal.  Eyes: Conjunctivae and EOM are normal. Right eye exhibits no discharge. Left eye exhibits no discharge.  Neck: Normal range of motion. Neck supple.  Cardiovascular: Normal rate, regular rhythm, normal heart sounds and intact distal pulses.  Exam reveals no gallop and no friction rub.   No murmur heard. Pulmonary/Chest: Effort normal and breath sounds normal. No respiratory distress. She has no decreased breath sounds. She has no wheezes. She has no rhonchi. She has no rales.  Abdominal: Soft. Normal appearance and bowel sounds are normal. She exhibits no distension. There is tenderness in the right lower quadrant, suprapubic area and left lower quadrant. There is no rigidity, no rebound, no guarding, no CVA tenderness, no tenderness at McBurney's point and negative  Murphy's sign.    Soft, morbidly obese which limits exam but no obvious distension, +BS throughout, with diffuse lower abdominal TTP along pelvic brim, no r/g/r, neg murphy's, neg mcburney's, no CVA TTP   Genitourinary: Uterus normal. Pelvic exam was performed with patient supine. There is no rash, tenderness or lesion on the right labia. There is no rash, tenderness or lesion on the left labia. Cervix exhibits discharge and friability. Cervix exhibits no motion tenderness. Right adnexum displays tenderness. Right adnexum displays no mass and no fullness. Left adnexum displays tenderness. Left adnexum displays no mass and no fullness. No erythema, tenderness or bleeding in the vagina. Vaginal discharge found.  Chaperone present for exam. No rashes, lesions, or tenderness to external genitalia. No erythema, injury, or tenderness to vaginal mucosa. +Mucoid vaginal discharge without bleeding within vaginal vault. No adnexal masses or fullness but with mild b/l adnexal tenderness. No CMT, +cervical friability, and +mucoid discharge from cervical os. Cervical os is closed. Uterus non-deviated, mobile, nonTTP, and without enlargement.    Musculoskeletal: Normal range of motion.  Neurological: She is alert and oriented to person, place, and time. She has normal strength. No sensory deficit.  Skin: Skin is warm, dry and intact. No rash noted.  Psychiatric: She  has a normal mood and affect.  Nursing note and vitals reviewed.   ED Course  Procedures (including critical care time) Labs Review Labs Reviewed  WET PREP, GENITAL - Abnormal; Notable for the following:    WBC, Wet Prep HPF POC MODERATE (*)    All other components within normal limits  URINALYSIS, ROUTINE W REFLEX MICROSCOPIC (NOT AT [facility]) - Abnormal; Notable for the following:    APPearance CLOUDY (*)    All other components within normal limits  PREGNANCY, URINE  RPR  HIV ANTIBODY (ROUTINE TESTING)  GC/CHLAMYDIA PROBE AMP ([place])  NOT AT [facility]    Imaging Review US Transvaginal Non-ob  07/11/2015  CLINICAL DATA:  Pelvic pain and bilateral adnexal pain. Polycystic ovary disease. EXAM: TRANSABDOMINAL AND TRANSVAGINAL ULTRASOUND OF PELVIS DOPPLER ULTRASOUND OF OVARIES TECHNIQUE: Both transabdominal and transvaginal ultrasound examinations of the pelvis were performed. Transabdominal technique was performed for global imaging of the pelvis including uterus, ovaries, adnexal regions, and pelvic cul-de-sac. It was necessary to proceed with endovaginal exam following the transabdominal exam to visualize the endometrium and adnexa. Color and duplex Doppler ultrasound was utilized to evaluate blood flow to the ovaries. COMPARISON:  03/26/2015 FINDINGS: Uterus Measurements: 8.6 by 4.1 by 5.8 cm. No fibroids or other mass visualized. Endometrium Thickness: 7 mm.  No focal abnormality visualized. Right ovary Measurements: 3.4 by 2.0 by 3.5 cm. 1.4 by 1.5 by 1.7 cm follicle observed transabdominally. Left ovary Measurements: 5.6 by 3.5 by 2.8 cm. This includes day 3.1 by 2.1 by 2.1 cm hypoechoic structure with a thin internal septation but no internal flow. This lesion may have faint internal echoes. Pulsed Doppler evaluation of both ovaries demonstrates normal low-resistance arterial and venous waveforms. Other findings Trace free pelvic fluid. IMPRESSION: 1. 3.1 by 2.1 by 2.1 cm cyst with a single thin internal septation in questionable internal echoes in the left ovary. Given the patient's age and the overall appearance, no follow up is required. This recommendation follows the consensus statement: Management of Asymptomatic Ovarian and Other Adnexal Cysts Imaged at [place]: Society of Radiologists in Ultrasound Consensus Conference Statement. Radiology 2010; [phone]. 2. The uterus appears unremarkable. 3. A cause for the patient's pelvic pain is not observed. Electronically Signed   By: Gaylyn Rong M.D.   On: 07/11/2015 14:32   US Pelvis  Complete  07/11/2015  CLINICAL DATA:  Pelvic pain and bilateral adnexal pain. Polycystic ovary disease. EXAM: TRANSABDOMINAL AND TRANSVAGINAL ULTRASOUND OF PELVIS DOPPLER ULTRASOUND OF OVARIES TECHNIQUE: Both transabdominal and transvaginal ultrasound examinations of the pelvis were performed. Transabdominal technique was performed for global imaging of the pelvis including uterus, ovaries, adnexal regions, and pelvic cul-de-sac. It was necessary to proceed with endovaginal exam following the transabdominal exam to visualize the endometrium and adnexa. Color and duplex Doppler ultrasound was utilized to evaluate blood flow to the ovaries. COMPARISON:  03/26/2015 FINDINGS: Uterus Measurements: 8.6 by 4.1 by 5.8 cm. No fibroids or other mass visualized. Endometrium Thickness: 7 mm.  No focal abnormality visualized. Right ovary Measurements: 3.4 by 2.0 by 3.5 cm. 1.4 by 1.5 by 1.7 cm follicle observed transabdominally. Left ovary Measurements: 5.6 by 3.5 by 2.8 cm. This includes day 3.1 by 2.1 by 2.1 cm hypoechoic structure with a thin internal septation but no internal flow. This lesion may have faint internal echoes. Pulsed Doppler evaluation of both ovaries demonstrates normal low-resistance arterial and venous waveforms. Other findings Trace free pelvic fluid. IMPRESSION: 1. 3.1 by 2.1 by 2.1 cm cyst with a  single thin internal septation in questionable internal echoes in the left ovary. Given the patient's age and the overall appearance, no follow up is required. This recommendation follows the consensus statement: Management of Asymptomatic Ovarian and Other Adnexal Cysts Imaged at [place]: Society of Radiologists in Ultrasound Consensus Conference Statement. Radiology 2010; [phone]. 2. The uterus appears unremarkable. 3. A cause for the patient's pelvic pain is not observed. Electronically Signed   By: Gaylyn Rong M.D.   On: 07/11/2015 14:32   [place] Art/ven Flow Abd Pelv Doppler  07/11/2015  CLINICAL  DATA:  Pelvic pain and bilateral adnexal pain. Polycystic ovary disease. EXAM: TRANSABDOMINAL AND TRANSVAGINAL ULTRASOUND OF PELVIS DOPPLER ULTRASOUND OF OVARIES TECHNIQUE: Both transabdominal and transvaginal ultrasound examinations of the pelvis were performed. Transabdominal technique was performed for global imaging of the pelvis including uterus, ovaries, adnexal regions, and pelvic cul-de-sac. It was necessary to proceed with endovaginal exam following the transabdominal exam to visualize the endometrium and adnexa. Color and duplex Doppler ultrasound was utilized to evaluate blood flow to the ovaries. COMPARISON:  03/26/2015 FINDINGS: Uterus Measurements: 8.6 by 4.1 by 5.8 cm. No fibroids or other mass visualized. Endometrium Thickness: 7 mm.  No focal abnormality visualized. Right ovary Measurements: 3.4 by 2.0 by 3.5 cm. 1.4 by 1.5 by 1.7 cm follicle observed transabdominally. Left ovary Measurements: 5.6 by 3.5 by 2.8 cm. This includes day 3.1 by 2.1 by 2.1 cm hypoechoic structure with a thin internal septation but no internal flow. This lesion may have faint internal echoes. Pulsed Doppler evaluation of both ovaries demonstrates normal low-resistance arterial and venous waveforms. Other findings Trace free pelvic fluid. IMPRESSION: 1. 3.1 by 2.1 by 2.1 cm cyst with a single thin internal septation in questionable internal echoes in the left ovary. Given the patient's age and the overall appearance, no follow up is required. This recommendation follows the consensus statement: Management of Asymptomatic Ovarian and Other Adnexal Cysts Imaged at [place]: Society of Radiologists in Ultrasound Consensus Conference Statement. Radiology 2010; [phone]. 2. The uterus appears unremarkable. 3. A cause for the patient's pelvic pain is not observed. Electronically Signed   By: Gaylyn Rong M.D.   On: 07/11/2015 14:32   I have personally reviewed and evaluated these images and lab results as part of my medical  decision-making.   EKG Interpretation None      MDM   Final diagnoses:  Lower abdominal pain  Adnexal tenderness  Constipation, unspecified constipation type  Unprotected sex  Vaginal discharge  Cyst of left ovary    30 y.o. female here with lower abd pain x2wks, missed period last month but recently changed to different birth control method. Took home preg tests which were neg. On exam, diffuse lower abdominal tenderness, no focal area of tenderness. Will proceed with pelvic exam to further eval her pelvic pain complaint. Will get STD testing. Will also get u/a and Upreg. Will reassess shortly. Declines wanting medications for pain.  1:20 PM Pelvic exam performed, vaginal discharge and cervical friability with b/l adnexal tenderness noted. Will perform pelvic u/s to fully eval for other etiologies, specifically ovarian cysts vs TOA vs torsion, etc. Will empirically cover for GC/CT. Of note, Upreg neg and U/A unremarkable. Will reassess shortly  3:52 PM Wet prep with moderate WBC but nothing else noted. U/S showing L ovarian cyst but otherwise negative. Could be the cause of her symptoms. Discussed tylenol/naprosyn for pain. Discussed that constipation could be contributing to her symptoms, increase water and fiber intake and start  miralax daily. F/up with her PCP or OBGYN in 1wk for recheck. Discussed no sexual activity until her testing results return and she knows whether anything is + or not. Discussed that lab will call her if testing is positive. I explained the diagnosis and have given explicit precautions to return to the ER including for any other new or worsening symptoms. The patient understands and accepts the medical plan as it's been dictated and I have answered their questions. Discharge instructions concerning home care and prescriptions have been given. The patient is STABLE and is discharged to home in good condition.  BP 111/75 mmHg  Pulse 80  Temp(Src) 98.2 F (36.8  C) (Oral)  Resp 16  SpO2 100%  LMP 05/28/2015  Meds ordered this encounter  Medications  . azithromycin (ZITHROMAX) tablet 1,000 mg    Sig:    And  . cefTRIAXone (ROCEPHIN) injection 250 mg    Sig:     Order Specific Question:  Antibiotic Indication:    Answer:  STD  . lidocaine (PF) (XYLOCAINE) 1 % injection    Sig:     Earlene Plater, Misty   : cabinet override  . naproxen (NAPROSYN) 500 MG tablet    Sig: Take 1 tablet (500 mg total) by mouth 2 (two) times daily as needed for mild pain or moderate pain (TAKE WITH MEALS.).    Dispense:  20 tablet    Refill:  0    Order Specific Question:  Supervising Provider    Answer:  Eber Hong [[id number]]     Calib Wadhwa Camprubi-Soms, PA-C 07/11/15 1557  Melene Plan, DO 07/11/15 1846

## 2015-07-11 NOTE — Discharge Instructions (Signed)
Your abdominal pain could be from ovarian cysts, or could be from constipation. Use naprosyn or tylenol as needed for pain. Increase fiber and water intake in your diet. Take over the counter miralax daily until you have daily soft stools and as needed thereafter to continue having daily soft stools. You have been treated for gonorrhea and chlamydia in the ER but the hospital will call you if lab is positive. You were tested for HIV and Syphilis, and the hospital will call you if the lab is positive. DO NOT ENGAGE IN SEXUAL ACTIVITY UNTIL YOU FIND OUT ABOUT YOUR RESULTS, THIS WILL INVALIDATE YOUR TREATMENT HERE IF YOU ENGAGE IN SEX BEFORE KNOWING YOUR RESULTS AND HAVING PARTNERS TESTED AND TREATED. ALL PARTNERS MUST BE TESTED AND TREATED FOR STD'S. Follow up with [facility] for future STD concerns or screenings. This is the recommendation by the CDC for people with multiple sexual partners or history of STDs. Follow up with your OBGYN or regular doctor in 1 week for recheck of ongoing symptoms. Return to the ER for changes or worsening symptoms.   Abdominal (belly) pain can be caused by many things. Your caregiver performed an examination and possibly ordered blood/urine tests and imaging (CT scan, x-rays, ultrasound). Many cases can be observed and treated at home after initial evaluation in the emergency department. Even though you are being discharged home, abdominal pain can be unpredictable. Therefore, you need a repeated exam if your pain does not resolve, returns, or worsens. Most patients with abdominal pain don't have to be admitted to the hospital or have surgery, but serious problems like appendicitis and gallbladder attacks can start out as nonspecific pain. Many abdominal conditions cannot be diagnosed in one visit, so follow-up evaluations are very important. SEEK IMMEDIATE MEDICAL ATTENTION IF YOU DEVELOP ANY OF THE FOLLOWING SYMPTOMS:  The pain does not go  away or becomes severe.   A temperature above 101 develops.   Repeated vomiting occurs (multiple episodes).   The pain becomes localized to portions of the abdomen. The right side could possibly be appendicitis. In an adult, the left lower portion of the abdomen could be colitis or diverticulitis.   Blood is being passed in stools or vomit (bright red or black tarry stools).   Return also if you develop chest pain, difficulty breathing, dizziness or fainting, or become confused, poorly responsive, or inconsolable (young children).  The constipation stays for more than 4 days.   There is belly (abdominal) or rectal pain.   You do not seem to be getting better.    Abdominal Pain, Adult Many things can cause belly (abdominal) pain. Most times, the belly pain is not dangerous. Many cases of belly pain can be watched and treated at home. HOME CARE   Do not take medicines that help you go poop (laxatives) unless told to by your doctor.  Only take medicine as told by your doctor.  Eat or drink as told by your doctor. Your doctor will tell you if you should be on a special diet. GET HELP IF:  You do not know what is causing your belly pain.  You have belly pain while you are sick to your stomach (nauseous) or have runny poop (diarrhea).  You have pain while you pee or poop.  Your belly pain wakes you up at night.  You have belly pain that gets worse or better when you eat.  You have belly pain that gets worse when you eat  fatty foods.  You have a fever. GET HELP RIGHT AWAY IF:   The pain does not go away within 2 hours.  You keep throwing up (vomiting).  The pain changes and is only in the right or left part of the belly.  You have bloody or tarry looking poop. MAKE SURE YOU:   Understand these instructions.  Will watch your condition.  Will get help right away if you are not doing well or get worse.   This information is not intended to replace advice given to you  by your health care provider. Make sure you discuss any questions you have with your health care provider.   Document Released: 10/31/2007 Document Revised: 06/04/2014 Document Reviewed: 01/21/2013 Elsevier Interactive Patient Education 2016 [employer].  Constipation, Adult Constipation is when a person:  Poops (has a bowel movement) less than 3 times a week.  Has a hard time pooping.  Has poop that is dry, hard, or bigger than normal. HOME CARE   Eat foods with a lot of fiber in them. This includes fruits, vegetables, beans, and whole grains such as brown rice.  Avoid fatty foods and foods with a lot of sugar. This includes french fries, hamburgers, cookies, candy, and soda.  If you are not getting enough fiber from food, take products with added fiber in them (supplements).  Drink enough fluid to keep your pee (urine) clear or pale yellow.  Exercise on a regular basis, or as told by your doctor.  Go to the restroom when you feel like you need to poop. Do not hold it.  Only take medicine as told by your doctor. Do not take medicines that help you poop (laxatives) without talking to your doctor first. GET HELP RIGHT AWAY IF:   You have bright red blood in your poop (stool).  Your constipation lasts more than 4 days or gets worse.  You have belly (abdominal) or butt (rectal) pain.  You have thin poop (as thin as a pencil).  You lose weight, and it cannot be explained. MAKE SURE YOU:   Understand these instructions.  Will watch your condition.  Will get help right away if you are not doing well or get worse.   This information is not intended to replace advice given to you by your health care provider. Make sure you discuss any questions you have with your health care provider.   Document Released: 10/31/2007 Document Revised: 06/04/2014 Document Reviewed: 02/23/2013 Elsevier Interactive Patient Education 2016 [employer]  High-Fiber Diet Fiber, also called  dietary fiber, is a type of carbohydrate found in fruits, vegetables, whole grains, and beans. A high-fiber diet can have many health benefits. Your health care provider may recommend a high-fiber diet to help:  Prevent constipation. Fiber can make your bowel movements more regular.  Lower your cholesterol.  Relieve hemorrhoids, uncomplicated diverticulosis, or irritable bowel syndrome.  Prevent overeating as part of a weight-loss plan.  Prevent heart disease, type 2 diabetes, and certain cancers. WHAT IS MY PLAN? The recommended daily intake of fiber includes:  38 grams for men under age 51.  30 grams for men over age 46.  25 grams for women under age 61.  21 grams for women over age 105. You can get the recommended daily intake of dietary fiber by eating a variety of fruits, vegetables, grains, and beans. Your health care provider may also recommend a fiber supplement if it is not possible to get enough fiber through your diet. WHAT DO  I NEED TO KNOW ABOUT A HIGH-FIBER DIET?  Fiber supplements have not been widely studied for their effectiveness, so it is better to get fiber through food sources.  Always check the fiber content on thenutrition facts label of any prepackaged food. Look for foods that contain at least 5 grams of fiber per serving.  Ask your dietitian if you have questions about specific foods that are related to your condition, especially if those foods are not listed in the following section.  Increase your daily fiber consumption gradually. Increasing your intake of dietary fiber too quickly may cause bloating, cramping, or gas.  Drink plenty of water. Water helps you to digest fiber. WHAT FOODS CAN I EAT? Grains Whole-grain breads. Multigrain cereal. Oats and oatmeal. Brown rice. Barley. Bulgur wheat. Millet. Bran muffins. Popcorn. Rye wafer crackers. Vegetables Sweet potatoes. Spinach. Kale. Artichokes. Cabbage. Broccoli. Green peas. Carrots.  Squash. Fruits Berries. Pears. Apples. Oranges. Avocados. Prunes and raisins. Dried figs. Meats and Other Protein Sources Navy, kidney, pinto, and soy beans. Split peas. Lentils. Nuts and seeds. Dairy Fiber-fortified yogurt. Beverages Fiber-fortified soy milk. Fiber-fortified orange juice. Other Fiber bars. The items listed above may not be a complete list of recommended foods or beverages. Contact your dietitian for more options. WHAT FOODS ARE NOT RECOMMENDED? Grains White bread. Pasta made with refined flour. White rice. Vegetables Fried potatoes. Canned vegetables. Well-cooked vegetables.  Fruits Fruit juice. Cooked, strained fruit. Meats and Other Protein Sources Fatty cuts of meat. Fried Environmental education officer or fried fish. Dairy Milk. Yogurt. Cream cheese. Sour cream. Beverages Soft drinks. Other Cakes and pastries. Butter and oils. The items listed above may not be a complete list of foods and beverages to avoid. Contact your dietitian for more information. WHAT ARE SOME TIPS FOR INCLUDING HIGH-FIBER FOODS IN MY DIET?  Eat a wide variety of high-fiber foods.  Make sure that half of all grains consumed each day are whole grains.  Replace breads and cereals made from refined flour or white flour with whole-grain breads and cereals.  Replace white rice with brown rice, bulgur wheat, or millet.  Start the day with a breakfast that is high in fiber, such as a cereal that contains at least 5 grams of fiber per serving.  Use beans in place of meat in soups, salads, or pasta.  Eat high-fiber snacks, such as berries, raw vegetables, nuts, or popcorn.   This information is not intended to replace advice given to you by your health care provider. Make sure you discuss any questions you have with your health care provider.   Document Released: 05/14/2005 Document Revised: 06/04/2014 Document Reviewed: 10/27/2013 Elsevier Interactive Patient Education 2016 [employer].  Safe Sex Safe  sex is about reducing the risk of giving or getting a sexually transmitted disease (STD). STDs are spread through sexual contact involving the genitals, mouth, or rectum. Some STDs can be cured and others cannot. Safe sex can also prevent unintended pregnancies.  WHAT ARE SOME SAFE SEX PRACTICES?  Limit your sexual activity to only one partner who is having sex with only you.  Talk to your partner about his or her past partners, past STDs, and drug use.  Use a condom every time you have sexual intercourse. This includes vaginal, oral, and anal sexual activity. Both females and males should wear condoms during oral sex. Only use latex or polyurethane condoms and water-based lubricants. Using petroleum-based lubricants or oils to lubricate a condom will weaken the condom and increase the chance that it  will break. The condom should be in place from the beginning to the end of sexual activity. Wearing a condom reduces, but does not completely eliminate, your risk of getting or giving an STD. STDs can be spread by contact with infected body fluids and skin.  Get vaccinated for hepatitis B and HPV.  Avoid alcohol and recreational drugs, which can affect your judgment. You may forget to use a condom or participate in high-risk sex.  For females, avoid douching after sexual intercourse. Douching can spread an infection farther into the reproductive tract.  Check your body for signs of sores, blisters, rashes, or unusual discharge. See your health care provider if you notice any of these signs.  Avoid sexual contact if you have symptoms of an infection or are being treated for an STD. If you or your partner has herpes, avoid sexual contact when blisters are present. Use condoms at all other times.  If you are at risk of being infected with HIV, it is recommended that you take a prescription medicine daily to prevent HIV infection. This is called pre-exposure prophylaxis (PrEP). You are considered at risk  if:  You are a man who has sex with other men (MSM).  You are a heterosexual man or woman who is sexually active with more than one partner.  You take drugs by injection.  You are sexually active with a partner who has HIV.  Talk with your health care provider about whether you are at high risk of being infected with HIV. If you choose to begin PrEP, you should first be tested for HIV. You should then be tested every 3 months for as long as you are taking PrEP.  See your health care provider for regular screenings, exams, and tests for other STDs. Before having sex with a new partner, each of you should be screened for STDs and should talk about the results with each other. WHAT ARE THE BENEFITS OF SAFE SEX?   There is less chance of getting or giving an STD.  You can prevent unwanted or unintended pregnancies.  By discussing safe sex concerns with your partner, you may increase feelings of intimacy, comfort, trust, and honesty between the two of you.   This information is not intended to replace advice given to you by your health care provider. Make sure you discuss any questions you have with your health care provider.   Document Released: 06/21/2004 Document Revised: 06/04/2014 Document Reviewed: 11/05/2011 Elsevier Interactive Patient Education [employer]

## 2015-07-11 NOTE — ED Notes (Signed)
Pt amb to triage with quick steady gait in nad. Pt reports generalized pelvic pain x 1/1. Pt states she missed her period at the end of January, but has taken multiple preg tests, all negative. Denies any spotting or d/c. Describes pain as cramping, and wonders if she is pregnant again.

## 2015-07-12 LAB — HIV ANTIBODY (ROUTINE TESTING W REFLEX): HIV Screen 4th Generation wRfx: NONREACTIVE

## 2015-07-12 LAB — RPR: RPR: NONREACTIVE

## 2015-07-12 LAB — GC/CHLAMYDIA PROBE AMP (~~LOC~~) NOT AT ARMC
Chlamydia: NEGATIVE
Neisseria Gonorrhea: NEGATIVE

## 2015-07-29 LAB — HM PAP SMEAR

## 2015-08-08 ENCOUNTER — Encounter: Payer: Self-pay | Admitting: Internal Medicine

## 2015-08-08 ENCOUNTER — Ambulatory Visit (INDEPENDENT_AMBULATORY_CARE_PROVIDER_SITE_OTHER): Payer: BLUE CROSS/BLUE SHIELD | Admitting: Internal Medicine

## 2015-08-08 ENCOUNTER — Other Ambulatory Visit (INDEPENDENT_AMBULATORY_CARE_PROVIDER_SITE_OTHER): Payer: BLUE CROSS/BLUE SHIELD

## 2015-08-08 VITALS — BP 100/78 | HR 80 | Temp 97.8°F | Resp 16 | Ht 62.0 in | Wt 257.0 lb

## 2015-08-08 DIAGNOSIS — Z0000 Encounter for general adult medical examination without abnormal findings: Secondary | ICD-10-CM

## 2015-08-08 DIAGNOSIS — J069 Acute upper respiratory infection, unspecified: Secondary | ICD-10-CM | POA: Diagnosis not present

## 2015-08-08 DIAGNOSIS — E282 Polycystic ovarian syndrome: Secondary | ICD-10-CM

## 2015-08-08 DIAGNOSIS — D510 Vitamin B12 deficiency anemia due to intrinsic factor deficiency: Secondary | ICD-10-CM

## 2015-08-08 DIAGNOSIS — Z23 Encounter for immunization: Secondary | ICD-10-CM

## 2015-08-08 DIAGNOSIS — B353 Tinea pedis: Secondary | ICD-10-CM

## 2015-08-08 DIAGNOSIS — Z30017 Encounter for initial prescription of implantable subdermal contraceptive: Secondary | ICD-10-CM | POA: Insufficient documentation

## 2015-08-08 DIAGNOSIS — B9789 Other viral agents as the cause of diseases classified elsewhere: Secondary | ICD-10-CM

## 2015-08-08 LAB — COMPREHENSIVE METABOLIC PANEL
ALBUMIN: 4.2 g/dL (ref 3.5–5.2)
ALK PHOS: 54 U/L (ref 39–117)
ALT: 33 U/L (ref 0–35)
AST: 21 U/L (ref 0–37)
BUN: 9 mg/dL (ref 6–23)
CO2: 22 mEq/L (ref 19–32)
CREATININE: 0.8 mg/dL (ref 0.40–1.20)
Calcium: 8.8 mg/dL (ref 8.4–10.5)
Chloride: 108 mEq/L (ref 96–112)
GFR: 110.33 mL/min (ref >60.00)
GLUCOSE: 109 mg/dL — AB (ref 70–99)
Potassium: 3.5 mEq/L (ref 3.5–5.1)
SODIUM: 139 meq/L (ref 135–145)
TOTAL PROTEIN: 7.4 g/dL (ref 6.0–8.3)
Total Bilirubin: 0.3 mg/dL (ref 0.2–1.2)

## 2015-08-08 LAB — CBC WITH DIFFERENTIAL/PLATELET
BASOS PCT: 0.7 % (ref 0.0–3.0)
Basophils Absolute: 0 10*3/uL (ref 0.0–0.1)
EOS ABS: 0.2 10*3/uL (ref 0.0–0.7)
Eosinophils Relative: 3.6 % (ref 0.0–5.0)
HCT: 40.2 % (ref 36.0–46.0)
HEMOGLOBIN: 13.7 g/dL (ref 12.0–15.0)
LYMPHS ABS: 1.5 10*3/uL (ref 0.7–4.0)
Lymphocytes Relative: 26 % (ref 12.0–46.0)
MCHC: 34 g/dL (ref 30.0–36.0)
MCV: 87.2 fl (ref 78.0–100.0)
MONO ABS: 0.6 10*3/uL (ref 0.1–1.0)
Monocytes Relative: 10.2 % (ref 3.0–12.0)
NEUTROS PCT: 59.5 % (ref 43.0–77.0)
Neutro Abs: 3.5 10*3/uL (ref 1.4–7.7)
Platelets: 224 10*3/uL (ref 150.0–400.0)
RBC: 4.61 Mil/uL (ref 3.87–5.11)
RDW: 14.4 % (ref 11.5–15.5)
WBC: 5.8 10*3/uL (ref 4.0–10.5)

## 2015-08-08 LAB — TSH: TSH: 0.89 u[IU]/mL (ref 0.35–4.50)

## 2015-08-08 LAB — LIPID PANEL
CHOLESTEROL: 157 mg/dL (ref 0–200)
HDL: 46.5 mg/dL (ref >39.00)
LDL Cholesterol: 78 mg/dL (ref 0–99)
NonHDL: 110.44
Total CHOL/HDL Ratio: 3
Triglycerides: 161 mg/dL — ABNORMAL HIGH (ref 0.0–149.0)
VLDL: 32.2 mg/dL (ref 0.0–40.0)

## 2015-08-08 LAB — HEMOGLOBIN A1C: HEMOGLOBIN A1C: 6 % (ref 4.6–6.5)

## 2015-08-08 MED ORDER — TOLNAFTATE 1 % EX POWD: 1.0000 "application " | Freq: Two times a day (BID) | CUTANEOUS | Status: DC

## 2015-08-08 MED ORDER — PROMETHAZINE HCL 12.5 MG PO TABS: 12.5000 mg | ORAL_TABLET | Freq: Four times a day (QID) | ORAL | Status: DC | PRN

## 2015-08-08 NOTE — Progress Notes (Signed)
Pre visit review using our clinic review tool, if applicable. No additional management support is needed unless otherwise documented below in the visit note. 

## 2015-08-08 NOTE — Progress Notes (Signed)
Subjective:  Patient ID: Traci Peterson, female    DOB: Nov 11, 1985  Age: 30 y.o. MRN: [id number]  CC: URI; Rash; and Annual Exam   HPI Traci Peterson presents for a CPX.  She complains of a rash on both feet with a foul smell. She has not treated this in any way. She complains of a 5 day history of nonproductive cough, facial pain, runny nose, thick yellow nasal phlegm, sore throat, and sneezing. She denies fever, chills, headache, earaches.  Outpatient Prescriptions Prior to Visit  Medication Sig Dispense Refill  . naproxen (NAPROSYN) 500 MG tablet Take 1 tablet (500 mg total) by mouth 2 (two) times daily as needed for mild pain or moderate pain (TAKE WITH MEALS.). 20 tablet 0  . norgestimate-ethinyl estradiol (ORTHO-CYCLEN, 28,) 0.25-35 MG-MCG tablet Take 1 tablet by mouth daily. Begin 03/27/15 1 Package 11  . loratadine (CLARITIN) 10 MG tablet Take 1 tablet (10 mg total) by mouth daily. (Patient not taking: Reported on 08/08/2015) 30 tablet 0   No facility-administered medications prior to visit.    ROS Review of Systems  Constitutional: Negative.  Negative for fever, chills, diaphoresis, appetite change and fatigue.  HENT: Positive for congestion, postnasal drip, rhinorrhea, sinus pressure and sore throat. Negative for facial swelling, sneezing, trouble swallowing and voice change.   Eyes: Negative.  Negative for visual disturbance.  Respiratory: Positive for cough. Negative for apnea, choking, chest tightness, shortness of breath, wheezing and stridor.   Cardiovascular: Negative.  Negative for chest pain, palpitations and leg swelling.  Gastrointestinal: Negative.  Negative for nausea, vomiting, abdominal pain, diarrhea, constipation and blood in stool.  Endocrine: Negative.   Genitourinary: Negative.  Negative for dysuria, urgency, frequency, hematuria, flank pain, decreased urine volume and difficulty urinating.  Musculoskeletal: Negative.   Skin: Positive for rash.  Negative for color change, pallor and wound.  Allergic/Immunologic: Negative.   Neurological: Negative.   Hematological: Negative.   Psychiatric/Behavioral: Negative.     Objective:  BP 100/78 mmHg  Pulse 80  Temp(Src) 97.8 F (36.6 C) (Oral)  Resp 16  Ht 5\' 2"  (1.575 m)  Wt 257 lb (116.574 kg)  BMI 46.99 kg/m2  SpO2 98%  LMP 05/28/2015  BP Readings from Last 3 Encounters:  08/08/15 100/78  07/11/15 109/54  05/27/15 133/58    Wt Readings from Last 3 Encounters:  08/08/15 257 lb (116.574 kg)  05/27/15 251 lb (113.853 kg)  04/19/15 261 lb (118.389 kg)    Physical Exam  Constitutional: She is oriented to person, place, and time. She appears well-developed and well-nourished.  Non-toxic appearance. She does not have a sickly appearance. She does not appear ill. No distress.  HENT:  Head: Normocephalic and atraumatic.  Right Ear: Tympanic membrane normal.  Left Ear: Tympanic membrane normal.  Nose: Rhinorrhea present. No mucosal edema. Right sinus exhibits maxillary sinus tenderness. Right sinus exhibits no frontal sinus tenderness. Left sinus exhibits no maxillary sinus tenderness and no frontal sinus tenderness.  Mouth/Throat: Oropharynx is clear and moist and mucous membranes are normal. Mucous membranes are not pale, not dry and not cyanotic. No oral lesions. No trismus in the jaw. No uvula swelling. No oropharyngeal exudate, posterior oropharyngeal edema, posterior oropharyngeal erythema or tonsillar abscesses.  Eyes: Conjunctivae are normal. Right eye exhibits no discharge. Left eye exhibits no discharge. No scleral icterus.  Neck: Normal range of motion. Neck supple. No JVD present. No tracheal deviation present. No thyromegaly present.  Cardiovascular: Normal rate, regular rhythm, normal heart sounds and intact  distal pulses.  Exam reveals no gallop and no friction rub.   No murmur heard. Pulmonary/Chest: Effort normal and breath sounds normal. No stridor. No respiratory  distress. She has no wheezes. She has no rales. She exhibits no tenderness.  Musculoskeletal: Normal range of motion. She exhibits no edema or tenderness.  Lymphadenopathy:    She has no cervical adenopathy.  Neurological: She is oriented to person, place, and time.  Skin: Skin is warm and dry. Rash noted. She is not diaphoretic. No erythema. No pallor.  Over both feet on the plantar surface there is faint scale and slightly erythematous papules  Psychiatric: She has a normal mood and affect. Her behavior is normal. Thought content normal.  Vitals reviewed.   Lab Results  Component Value Date   WBC 5.8 08/08/2015   HGB 13.7 08/08/2015   HCT 40.2 08/08/2015   PLT 224.0 08/08/2015   GLUCOSE 109* 08/08/2015   CHOL 157 08/08/2015   TRIG 161.0* 08/08/2015   HDL 46.50 08/08/2015   LDLCALC 78 08/08/2015   ALT 33 08/08/2015   AST 21 08/08/2015   NA 139 08/08/2015   K 3.5 08/08/2015   CL 108 08/08/2015   CREATININE 0.80 08/08/2015   BUN 9 08/08/2015   CO2 22 08/08/2015   TSH 0.89 08/08/2015   HGBA1C 6.0 08/08/2015    [place] Transvaginal Non-ob  07/11/2015  CLINICAL DATA:  Pelvic pain and bilateral adnexal pain. Polycystic ovary disease. EXAM: TRANSABDOMINAL AND TRANSVAGINAL ULTRASOUND OF PELVIS DOPPLER ULTRASOUND OF OVARIES TECHNIQUE: Both transabdominal and transvaginal ultrasound examinations of the pelvis were performed. Transabdominal technique was performed for global imaging of the pelvis including uterus, ovaries, adnexal regions, and pelvic cul-de-sac. It was necessary to proceed with endovaginal exam following the transabdominal exam to visualize the endometrium and adnexa. Color and duplex Doppler ultrasound was utilized to evaluate blood flow to the ovaries. COMPARISON:  03/26/2015 FINDINGS: Uterus Measurements: 8.6 by 4.1 by 5.8 cm. No fibroids or other mass visualized. Endometrium Thickness: 7 mm.  No focal abnormality visualized. Right ovary Measurements: 3.4 by 2.0 by 3.5 cm. 1.4  by 1.5 by 1.7 cm follicle observed transabdominally. Left ovary Measurements: 5.6 by 3.5 by 2.8 cm. This includes day 3.1 by 2.1 by 2.1 cm hypoechoic structure with a thin internal septation but no internal flow. This lesion may have faint internal echoes. Pulsed Doppler evaluation of both ovaries demonstrates normal low-resistance arterial and venous waveforms. Other findings Trace free pelvic fluid. IMPRESSION: 1. 3.1 by 2.1 by 2.1 cm cyst with a single thin internal septation in questionable internal echoes in the left ovary. Given the patient's age and the overall appearance, no follow up is required. This recommendation follows the consensus statement: Management of Asymptomatic Ovarian and Other Adnexal Cysts Imaged at [place]: Society of Radiologists in Ultrasound Consensus Conference Statement. Radiology 2010; [id number]. 2. The uterus appears unremarkable. 3. A cause for the patient's pelvic pain is not observed. Electronically Signed   By: Gaylyn RongWalter  Liebkemann M.D.   On: 07/11/2015 14:32   [place] Pelvis Complete  07/11/2015  CLINICAL DATA:  Pelvic pain and bilateral adnexal pain. Polycystic ovary disease. EXAM: TRANSABDOMINAL AND TRANSVAGINAL ULTRASOUND OF PELVIS DOPPLER ULTRASOUND OF OVARIES TECHNIQUE: Both transabdominal and transvaginal ultrasound examinations of the pelvis were performed. Transabdominal technique was performed for global imaging of the pelvis including uterus, ovaries, adnexal regions, and pelvic cul-de-sac. It was necessary to proceed with endovaginal exam following the transabdominal exam to visualize the endometrium and adnexa. Color and duplex Doppler  ultrasound was utilized to evaluate blood flow to the ovaries. COMPARISON:  03/26/2015 FINDINGS: Uterus Measurements: 8.6 by 4.1 by 5.8 cm. No fibroids or other mass visualized. Endometrium Thickness: 7 mm.  No focal abnormality visualized. Right ovary Measurements: 3.4 by 2.0 by 3.5 cm. 1.4 by 1.5 by 1.7 cm follicle observed  transabdominally. Left ovary Measurements: 5.6 by 3.5 by 2.8 cm. This includes day 3.1 by 2.1 by 2.1 cm hypoechoic structure with a thin internal septation but no internal flow. This lesion may have faint internal echoes. Pulsed Doppler evaluation of both ovaries demonstrates normal low-resistance arterial and venous waveforms. Other findings Trace free pelvic fluid. IMPRESSION: 1. 3.1 by 2.1 by 2.1 cm cyst with a single thin internal septation in questionable internal echoes in the left ovary. Given the patient's age and the overall appearance, no follow up is required. This recommendation follows the consensus statement: Management of Asymptomatic Ovarian and Other Adnexal Cysts Imaged at [place]: Society of Radiologists in Ultrasound Consensus Conference Statement. Radiology 2010; [phone]. 2. The uterus appears unremarkable. 3. A cause for the patient's pelvic pain is not observed. Electronically Signed   By: Gaylyn Rong M.D.   On: 07/11/2015 14:32   [place] Art/ven Flow Abd Pelv Doppler  07/11/2015  CLINICAL DATA:  Pelvic pain and bilateral adnexal pain. Polycystic ovary disease. EXAM: TRANSABDOMINAL AND TRANSVAGINAL ULTRASOUND OF PELVIS DOPPLER ULTRASOUND OF OVARIES TECHNIQUE: Both transabdominal and transvaginal ultrasound examinations of the pelvis were performed. Transabdominal technique was performed for global imaging of the pelvis including uterus, ovaries, adnexal regions, and pelvic cul-de-sac. It was necessary to proceed with endovaginal exam following the transabdominal exam to visualize the endometrium and adnexa. Color and duplex Doppler ultrasound was utilized to evaluate blood flow to the ovaries. COMPARISON:  03/26/2015 FINDINGS: Uterus Measurements: 8.6 by 4.1 by 5.8 cm. No fibroids or other mass visualized. Endometrium Thickness: 7 mm.  No focal abnormality visualized. Right ovary Measurements: 3.4 by 2.0 by 3.5 cm. 1.4 by 1.5 by 1.7 cm follicle observed transabdominally. Left ovary  Measurements: 5.6 by 3.5 by 2.8 cm. This includes day 3.1 by 2.1 by 2.1 cm hypoechoic structure with a thin internal septation but no internal flow. This lesion may have faint internal echoes. Pulsed Doppler evaluation of both ovaries demonstrates normal low-resistance arterial and venous waveforms. Other findings Trace free pelvic fluid. IMPRESSION: 1. 3.1 by 2.1 by 2.1 cm cyst with a single thin internal septation in questionable internal echoes in the left ovary. Given the patient's age and the overall appearance, no follow up is required. This recommendation follows the consensus statement: Management of Asymptomatic Ovarian and Other Adnexal Cysts Imaged at [place]: Society of Radiologists in Ultrasound Consensus Conference Statement. Radiology 2010; [phone]. 2. The uterus appears unremarkable. 3. A cause for the patient's pelvic pain is not observed. Electronically Signed   By: Gaylyn Rong M.D.   On: 07/11/2015 14:32    Assessment & Plan:   Traci Peterson was seen today for uri, rash and annual exam.  Diagnoses and all orders for this visit:  Routine general medical examination at a health care facility- her Pap smears up-to-date, vaccines were reviewed and updated, exam done, labs ordered and reviewed, patient education material was given. -     Lipid panel; Future -     Comprehensive metabolic panel; Future -     CBC with Differential/Platelet; Future -     TSH; Future -     Cancel: hCG, quantitative, pregnancy; Future  Tinea pedis, recurrent  PCOS (polycystic ovarian syndrome)- her A1c shows that she is prediabetic, no medications are needed at this time, she agrees to work on her lifestyle modifications. -     Hemoglobin A1c; Future  Pernicious anemia- this has resolved  Viral URI with cough- will treat the symptoms with Phenergan and will treat the infection with amoxicillin. -     promethazine (PHENERGAN) 12.5 MG tablet; Take 1 tablet (12.5 mg total) by mouth every 6 (six) hours as  needed for nausea or vomiting.  Tinea pedis of both feet -     tolnaftate (TINACTIN) 1 % powder; Apply 1 application topically 2 (two) times daily.  Encounter for immunization -     Flu Vaccine QUAD 36+ mos IM  I have discontinued Traci Peterson's norgestimate-ethinyl estradiol, loratadine, and naproxen. I am also having her start on amoxicillin. Additionally, I am having her maintain her etonogestrel-ethinyl estradiol and citalopram.  Meds ordered this encounter  Medications  . etonogestrel-ethinyl estradiol (NUVARING) 0.12-0.015 MG/24HR vaginal ring    Sig: Place 1 each vaginally every 28 (twenty-eight) days. Insert vaginally and leave in place for 3 consecutive weeks, then remove for 1 week.  . citalopram (CELEXA) 20 MG tablet    Sig: Take 20 mg by mouth daily.  Marland Kitchen DISCONTD: tolnaftate (TINACTIN) 1 % powder    Sig: Apply 1 application topically 2 (two) times daily.    Dispense:  45 g    Refill:  5  . DISCONTD: promethazine (PHENERGAN) 12.5 MG tablet    Sig: Take 1 tablet (12.5 mg total) by mouth every 6 (six) hours as needed for nausea or vomiting.    Dispense:  30 tablet    Refill:  0  . amoxicillin (AMOXIL) 875 MG tablet    Sig: Take 1 tablet (875 mg total) by mouth 2 (two) times daily.    Dispense:  20 tablet    Refill:  0     Follow-up: Return if symptoms worsen or fail to improve.  Sanda Linger, MD

## 2015-08-08 NOTE — Patient Instructions (Signed)
Preventive Care for Adults, Female A healthy lifestyle and preventive care can promote health and wellness. Preventive health guidelines for women include the following key practices.  A routine yearly physical is a good way to check with your health care provider about your health and preventive screening. It is a chance to share any concerns and updates on your health and to receive a thorough exam.  Visit your dentist for a routine exam and preventive care every 6 months. Brush your teeth twice a day and floss once a day. Good oral hygiene prevents tooth decay and gum disease.  The frequency of eye exams is based on your age, health, family medical history, use of contact lenses, and other factors. Follow your health care provider's recommendations for frequency of eye exams.  Eat a healthy diet. Foods like vegetables, fruits, whole grains, low-fat dairy products, and lean protein foods contain the nutrients you need without too many calories. Decrease your intake of foods high in solid fats, added sugars, and salt. Eat the right amount of calories for you.Get information about a proper diet from your health care provider, if necessary.  Regular physical exercise is one of the most important things you can do for your health. Most adults should get at least 150 minutes of moderate-intensity exercise (any activity that increases your heart rate and causes you to sweat) each week. In addition, most adults need muscle-strengthening exercises on 2 or more days a week.  Maintain a healthy weight. The body mass index (BMI) is a screening tool to identify possible weight problems. It provides an estimate of body fat based on height and weight. Your health care provider can find your BMI and can help you achieve or maintain a healthy weight.For adults 20 years and older:  A BMI below 18.5 is considered underweight.  A BMI of 18.5 to 24.9 is normal.  A BMI of 25 to 29.9 is considered overweight.  A  BMI of 30 and above is considered obese.  Maintain normal blood lipids and cholesterol levels by exercising and minimizing your intake of saturated fat. Eat a balanced diet with plenty of fruit and vegetables. Blood tests for lipids and cholesterol should begin at age 45 and be repeated every 5 years. If your lipid or cholesterol levels are high, you are over 50, or you are at high risk for heart disease, you may need your cholesterol levels checked more frequently.Ongoing high lipid and cholesterol levels should be treated with medicines if diet and exercise are not working.  If you smoke, find out from your health care provider how to quit. If you do not use tobacco, do not start.  Lung cancer screening is recommended for adults aged 45-80 years who are at high risk for developing lung cancer because of a history of smoking. A yearly low-dose CT scan of the lungs is recommended for people who have at least a 30-pack-year history of smoking and are a current smoker or have quit within the past 15 years. A pack year of smoking is smoking an average of 1 pack of cigarettes a day for 1 year (for example: 1 pack a day for 30 years or 2 packs a day for 15 years). Yearly screening should continue until the smoker has stopped smoking for at least 15 years. Yearly screening should be stopped for people who develop a health problem that would prevent them from having lung cancer treatment.  If you are pregnant, do not drink alcohol. If you are  breastfeeding, be very cautious about drinking alcohol. If you are not pregnant and choose to drink alcohol, do not have more than 1 drink per day. One drink is considered to be 12 ounces (355 mL) of beer, 5 ounces (148 mL) of wine, or 1.5 ounces (44 mL) of liquor.  Avoid use of street drugs. Do not share needles with anyone. Ask for help if you need support or instructions about stopping the use of drugs.  High blood pressure causes heart disease and increases the risk  of stroke. Your blood pressure should be checked at least every 1 to 2 years. Ongoing high blood pressure should be treated with medicines if weight loss and exercise do not work.  If you are 55-79 years old, ask your health care provider if you should take aspirin to prevent strokes.  Diabetes screening is done by taking a blood sample to check your blood glucose level after you have not eaten for a certain period of time (fasting). If you are not overweight and you do not have risk factors for diabetes, you should be screened once every 3 years starting at age 45. If you are overweight or obese and you are 40-70 years of age, you should be screened for diabetes every year as part of your cardiovascular risk assessment.  Breast cancer screening is essential preventive care for women. You should practice "breast self-awareness." This means understanding the normal appearance and feel of your breasts and may include breast self-examination. Any changes detected, no matter how small, should be reported to a health care provider. Women in their 20s and 30s should have a clinical breast exam (CBE) by a health care provider as part of a regular health exam every 1 to 3 years. After age 40, women should have a CBE every year. Starting at age 40, women should consider having a mammogram (breast X-ray test) every year. Women who have a family history of breast cancer should talk to their health care provider about genetic screening. Women at a high risk of breast cancer should talk to their health care providers about having an MRI and a mammogram every year.  Breast cancer gene (BRCA)-related cancer risk assessment is recommended for women who have family members with BRCA-related cancers. BRCA-related cancers include breast, ovarian, tubal, and peritoneal cancers. Having family members with these cancers may be associated with an increased risk for harmful changes (mutations) in the breast cancer genes BRCA1 and  BRCA2. Results of the assessment will determine the need for genetic counseling and BRCA1 and BRCA2 testing.  Your health care provider may recommend that you be screened regularly for cancer of the pelvic organs (ovaries, uterus, and vagina). This screening involves a pelvic examination, including checking for microscopic changes to the surface of your cervix (Pap test). You may be encouraged to have this screening done every 3 years, beginning at age 21.  For women ages 30-65, health care providers may recommend pelvic exams and Pap testing every 3 years, or they may recommend the Pap and pelvic exam, combined with testing for human papilloma virus (HPV), every 5 years. Some types of HPV increase your risk of cervical cancer. Testing for HPV may also be done on women of any age with unclear Pap test results.  Other health care providers may not recommend any screening for nonpregnant women who are considered low risk for pelvic cancer and who do not have symptoms. Ask your health care provider if a screening pelvic exam is right for   you.  If you have had past treatment for cervical cancer or a condition that could lead to cancer, you need Pap tests and screening for cancer for at least 20 years after your treatment. If Pap tests have been discontinued, your risk factors (such as having a new sexual partner) need to be reassessed to determine if screening should resume. Some women have medical problems that increase the chance of getting cervical cancer. In these cases, your health care provider may recommend more frequent screening and Pap tests.  Colorectal cancer can be detected and often prevented. Most routine colorectal cancer screening begins at the age of 50 years and continues through age 75 years. However, your health care provider may recommend screening at an earlier age if you have risk factors for colon cancer. On a yearly basis, your health care provider may provide home test kits to check  for hidden blood in the stool. Use of a small camera at the end of a tube, to directly examine the colon (sigmoidoscopy or colonoscopy), can detect the earliest forms of colorectal cancer. Talk to your health care provider about this at age 50, when routine screening begins. Direct exam of the colon should be repeated every 5-10 years through age 75 years, unless early forms of precancerous polyps or small growths are found.  People who are at an increased risk for hepatitis B should be screened for this virus. You are considered at high risk for hepatitis B if:  You were born in a country where hepatitis B occurs often. Talk with your health care provider about which countries are considered high risk.  Your parents were born in a high-risk country and you have not received a shot to protect against hepatitis B (hepatitis B vaccine).  You have HIV or AIDS.  You use needles to inject street drugs.  You live with, or have sex with, someone who has hepatitis B.  You get hemodialysis treatment.  You take certain medicines for conditions like cancer, organ transplantation, and autoimmune conditions.  Hepatitis C blood testing is recommended for all people born from 1945 through 1965 and any individual with known risks for hepatitis C.  Practice safe sex. Use condoms and avoid high-risk sexual practices to reduce the spread of sexually transmitted infections (STIs). STIs include gonorrhea, chlamydia, syphilis, trichomonas, herpes, HPV, and human immunodeficiency virus (HIV). Herpes, HIV, and HPV are viral illnesses that have no cure. They can result in disability, cancer, and death.  You should be screened for sexually transmitted illnesses (STIs) including gonorrhea and chlamydia if:  You are sexually active and are younger than 24 years.  You are older than 24 years and your health care provider tells you that you are at risk for this type of infection.  Your sexual activity has changed  since you were last screened and you are at an increased risk for chlamydia or gonorrhea. Ask your health care provider if you are at risk.  If you are at risk of being infected with HIV, it is recommended that you take a prescription medicine daily to prevent HIV infection. This is called preexposure prophylaxis (PrEP). You are considered at risk if:  You are sexually active and do not regularly use condoms or know the HIV status of your partner(s).  You take drugs by injection.  You are sexually active with a partner who has HIV.  Talk with your health care provider about whether you are at high risk of being infected with HIV. If   you choose to begin PrEP, you should first be tested for HIV. You should then be tested every 3 months for as long as you are taking PrEP.  Osteoporosis is a disease in which the bones lose minerals and strength with aging. This can result in serious bone fractures or breaks. The risk of osteoporosis can be identified using a bone density scan. Women ages 67 years and over and women at risk for fractures or osteoporosis should discuss screening with their health care providers. Ask your health care provider whether you should take a calcium supplement or vitamin D to reduce the rate of osteoporosis.  Menopause can be associated with physical symptoms and risks. Hormone replacement therapy is available to decrease symptoms and risks. You should talk to your health care provider about whether hormone replacement therapy is right for you.  Use sunscreen. Apply sunscreen liberally and repeatedly throughout the day. You should seek shade when your shadow is shorter than you. Protect yourself by wearing long sleeves, pants, a wide-brimmed hat, and sunglasses year round, whenever you are outdoors.  Once a month, do a whole body skin exam, using a mirror to look at the skin on your back. Tell your health care provider of new moles, moles that have irregular borders, moles that  are larger than a pencil eraser, or moles that have changed in shape or color.  Stay current with required vaccines (immunizations).  Influenza vaccine. All adults should be immunized every year.  Tetanus, diphtheria, and acellular pertussis (Td, Tdap) vaccine. Pregnant women should receive 1 dose of Tdap vaccine during each pregnancy. The dose should be obtained regardless of the length of time since the last dose. Immunization is preferred during the 27th-36th week of gestation. An adult who has not previously received Tdap or who does not know her vaccine status should receive 1 dose of Tdap. This initial dose should be followed by tetanus and diphtheria toxoids (Td) booster doses every 10 years. Adults with an unknown or incomplete history of completing a 3-dose immunization series with Td-containing vaccines should begin or complete a primary immunization series including a Tdap dose. Adults should receive a Td booster every 10 years.  Varicella vaccine. An adult without evidence of immunity to varicella should receive 2 doses or a second dose if she has previously received 1 dose. Pregnant females who do not have evidence of immunity should receive the first dose after pregnancy. This first dose should be obtained before leaving the health care facility. The second dose should be obtained 4-8 weeks after the first dose.  Human papillomavirus (HPV) vaccine. Females aged 13-26 years who have not received the vaccine previously should obtain the 3-dose series. The vaccine is not recommended for use in pregnant females. However, pregnancy testing is not needed before receiving a dose. If a female is found to be pregnant after receiving a dose, no treatment is needed. In that case, the remaining doses should be delayed until after the pregnancy. Immunization is recommended for any person with an immunocompromised condition through the age of 61 years if she did not get any or all doses earlier. During the  3-dose series, the second dose should be obtained 4-8 weeks after the first dose. The third dose should be obtained 24 weeks after the first dose and 16 weeks after the second dose.  Zoster vaccine. One dose is recommended for adults aged 30 years or older unless certain conditions are present.  Measles, mumps, and rubella (MMR) vaccine. Adults born  before 1957 generally are considered immune to measles and mumps. Adults born in 1957 or later should have 1 or more doses of MMR vaccine unless there is a contraindication to the vaccine or there is laboratory evidence of immunity to each of the three diseases. A routine second dose of MMR vaccine should be obtained at least 28 days after the first dose for students attending postsecondary schools, health care workers, or international travelers. People who received inactivated measles vaccine or an unknown type of measles vaccine during 1963-1967 should receive 2 doses of MMR vaccine. People who received inactivated mumps vaccine or an unknown type of mumps vaccine before 1979 and are at high risk for mumps infection should consider immunization with 2 doses of MMR vaccine. For females of childbearing age, rubella immunity should be determined. If there is no evidence of immunity, females who are not pregnant should be vaccinated. If there is no evidence of immunity, females who are pregnant should delay immunization until after pregnancy. Unvaccinated health care workers born before 1957 who lack laboratory evidence of measles, mumps, or rubella immunity or laboratory confirmation of disease should consider measles and mumps immunization with 2 doses of MMR vaccine or rubella immunization with 1 dose of MMR vaccine.  Pneumococcal 13-valent conjugate (PCV13) vaccine. When indicated, a person who is uncertain of his immunization history and has no record of immunization should receive the PCV13 vaccine. All adults 65 years of age and older should receive this  vaccine. An adult aged 19 years or older who has certain medical conditions and has not been previously immunized should receive 1 dose of PCV13 vaccine. This PCV13 should be followed with a dose of pneumococcal polysaccharide (PPSV23) vaccine. Adults who are at high risk for pneumococcal disease should obtain the PPSV23 vaccine at least 8 weeks after the dose of PCV13 vaccine. Adults older than 28 years of age who have normal immune system function should obtain the PPSV23 vaccine dose at least 1 year after the dose of PCV13 vaccine.  Pneumococcal polysaccharide (PPSV23) vaccine. When PCV13 is also indicated, PCV13 should be obtained first. All adults aged 65 years and older should be immunized. An adult younger than age 65 years who has certain medical conditions should be immunized. Any person who resides in a nursing home or long-term care facility should be immunized. An adult smoker should be immunized. People with an immunocompromised condition and certain other conditions should receive both PCV13 and PPSV23 vaccines. People with human immunodeficiency virus (HIV) infection should be immunized as soon as possible after diagnosis. Immunization during chemotherapy or radiation therapy should be avoided. Routine use of PPSV23 vaccine is not recommended for American Indians, [place] Natives, or people younger than 65 years unless there are medical conditions that require PPSV23 vaccine. When indicated, people who have unknown immunization and have no record of immunization should receive PPSV23 vaccine. One-time revaccination 5 years after the first dose of PPSV23 is recommended for people aged 19-64 years who have chronic kidney failure, nephrotic syndrome, asplenia, or immunocompromised conditions. People who received 1-2 doses of PPSV23 before age 65 years should receive another dose of PPSV23 vaccine at age 65 years or later if at least 5 years have passed since the previous dose. Doses of PPSV23 are not  needed for people immunized with PPSV23 at or after age 65 years.  Meningococcal vaccine. Adults with asplenia or persistent complement component deficiencies should receive 2 doses of quadrivalent meningococcal conjugate (MenACWY-D) vaccine. The doses should be obtained   at least 2 months apart. Microbiologists working with certain meningococcal bacteria, [place] recruits, people at risk during an outbreak, and people who travel to or live in countries with a high rate of meningitis should be immunized. A first-year college student up through age 34 years who is living in a residence hall should receive a dose if she did not receive a dose on or after her 16th birthday. Adults who have certain high-risk conditions should receive one or more doses of vaccine.  Hepatitis A vaccine. Adults who wish to be protected from this disease, have certain high-risk conditions, work with hepatitis A-infected animals, work in hepatitis A research labs, or travel to or work in countries with a high rate of hepatitis A should be immunized. Adults who were previously unvaccinated and who anticipate close contact with an international adoptee during the first 60 days after arrival in the [place] from a country with a high rate of hepatitis A should be immunized.  Hepatitis B vaccine. Adults who wish to be protected from this disease, have certain high-risk conditions, may be exposed to blood or other infectious body fluids, are household contacts or sex partners of hepatitis B positive people, are clients or workers in certain care facilities, or travel to or work in countries with a high rate of hepatitis B should be immunized.  Haemophilus influenzae type b (Hib) vaccine. A previously unvaccinated person with asplenia or sickle cell disease or having a scheduled splenectomy should receive 1 dose of Hib vaccine. Regardless of previous immunization, a recipient of a hematopoietic stem cell transplant should receive a  3-dose series 6-12 months after her successful transplant. Hib vaccine is not recommended for adults with HIV infection. Preventive Services / Frequency Ages 35 to 4 years  Blood pressure check.** / Every 3-5 years.  Lipid and cholesterol check.** / Every 5 years beginning at age 60.  Clinical breast exam.** / Every 3 years for women in their 71s and 10s.  BRCA-related cancer risk assessment.** / For women who have family members with a BRCA-related cancer (breast, ovarian, tubal, or peritoneal cancers).  Pap test.** / Every 2 years from ages 76 through 26. Every 3 years starting at age 61 through age 76 or 93 with a history of 3 consecutive normal Pap tests.  HPV screening.** / Every 3 years from ages 37 through ages 60 to 51 with a history of 3 consecutive normal Pap tests.  Hepatitis C blood test.** / For any individual with known risks for hepatitis C.  Skin self-exam. / Monthly.  Influenza vaccine. / Every year.  Tetanus, diphtheria, and acellular pertussis (Tdap, Td) vaccine.** / Consult your health care provider. Pregnant women should receive 1 dose of Tdap vaccine during each pregnancy. 1 dose of Td every 10 years.  Varicella vaccine.** / Consult your health care provider. Pregnant females who do not have evidence of immunity should receive the first dose after pregnancy.  HPV vaccine. / 3 doses over 6 months, if 93 and younger. The vaccine is not recommended for use in pregnant females. However, pregnancy testing is not needed before receiving a dose.  Measles, mumps, rubella (MMR) vaccine.** / You need at least 1 dose of MMR if you were born in 1957 or later. You may also need a 2nd dose. For females of childbearing age, rubella immunity should be determined. If there is no evidence of immunity, females who are not pregnant should be vaccinated. If there is no evidence of immunity, females who are  pregnant should delay immunization until after pregnancy.  Pneumococcal  13-valent conjugate (PCV13) vaccine.** / Consult your health care provider.  Pneumococcal polysaccharide (PPSV23) vaccine.** / 1 to 2 doses if you smoke cigarettes or if you have certain conditions.  Meningococcal vaccine.** / 1 dose if you are age 68 to 8 years and a Market researcher living in a residence hall, or have one of several medical conditions, you need to get vaccinated against meningococcal disease. You may also need additional booster doses.  Hepatitis A vaccine.** / Consult your health care provider.  Hepatitis B vaccine.** / Consult your health care provider.  Haemophilus influenzae type b (Hib) vaccine.** / Consult your health care provider. Ages 7 to 53 years  Blood pressure check.** / Every year.  Lipid and cholesterol check.** / Every 5 years beginning at age 25 years.  Lung cancer screening. / Every year if you are aged 11-80 years and have a 30-pack-year history of smoking and currently smoke or have quit within the past 15 years. Yearly screening is stopped once you have quit smoking for at least 15 years or develop a health problem that would prevent you from having lung cancer treatment.  Clinical breast exam.** / Every year after age 48 years.  BRCA-related cancer risk assessment.** / For women who have family members with a BRCA-related cancer (breast, ovarian, tubal, or peritoneal cancers).  Mammogram.** / Every year beginning at age 41 years and continuing for as long as you are in good health. Consult with your health care provider.  Pap test.** / Every 3 years starting at age 65 years through age 37 or 70 years with a history of 3 consecutive normal Pap tests.  HPV screening.** / Every 3 years from ages 72 years through ages 60 to 40 years with a history of 3 consecutive normal Pap tests.  Fecal occult blood test (FOBT) of stool. / Every year beginning at age 21 years and continuing until age 5 years. You may not need to do this test if you get  a colonoscopy every 10 years.  Flexible sigmoidoscopy or colonoscopy.** / Every 5 years for a flexible sigmoidoscopy or every 10 years for a colonoscopy beginning at age 35 years and continuing until age 48 years.  Hepatitis C blood test.** / For all people born from [birth year] through 1965 and any individual with known risks for hepatitis C.  Skin self-exam. / Monthly.  Influenza vaccine. / Every year.  Tetanus, diphtheria, and acellular pertussis (Tdap/Td) vaccine.** / Consult your health care provider. Pregnant women should receive 1 dose of Tdap vaccine during each pregnancy. 1 dose of Td every 10 years.  Varicella vaccine.** / Consult your health care provider. Pregnant females who do not have evidence of immunity should receive the first dose after pregnancy.  Zoster vaccine.** / 1 dose for adults aged 30 years or older.  Measles, mumps, rubella (MMR) vaccine.** / You need at least 1 dose of MMR if you were born in 1957 or later. You may also need a second dose. For females of childbearing age, rubella immunity should be determined. If there is no evidence of immunity, females who are not pregnant should be vaccinated. If there is no evidence of immunity, females who are pregnant should delay immunization until after pregnancy.  Pneumococcal 13-valent conjugate (PCV13) vaccine.** / Consult your health care provider.  Pneumococcal polysaccharide (PPSV23) vaccine.** / 1 to 2 doses if you smoke cigarettes or if you have certain conditions.  Meningococcal vaccine.** /  Consult your health care provider.  Hepatitis A vaccine.** / Consult your health care provider.  Hepatitis B vaccine.** / Consult your health care provider.  Haemophilus influenzae type b (Hib) vaccine.** / Consult your health care provider. Ages 64 years and over  Blood pressure check.** / Every year.  Lipid and cholesterol check.** / Every 5 years beginning at age 23 years.  Lung cancer screening. / Every year if you  are aged 16-80 years and have a 30-pack-year history of smoking and currently smoke or have quit within the past 15 years. Yearly screening is stopped once you have quit smoking for at least 15 years or develop a health problem that would prevent you from having lung cancer treatment.  Clinical breast exam.** / Every year after age 74 years.  BRCA-related cancer risk assessment.** / For women who have family members with a BRCA-related cancer (breast, ovarian, tubal, or peritoneal cancers).  Mammogram.** / Every year beginning at age 44 years and continuing for as long as you are in good health. Consult with your health care provider.  Pap test.** / Every 3 years starting at age 58 years through age 22 or 39 years with 3 consecutive normal Pap tests. Testing can be stopped between 65 and 70 years with 3 consecutive normal Pap tests and no abnormal Pap or HPV tests in the past 10 years.  HPV screening.** / Every 3 years from ages 64 years through ages 70 or 61 years with a history of 3 consecutive normal Pap tests. Testing can be stopped between 65 and 70 years with 3 consecutive normal Pap tests and no abnormal Pap or HPV tests in the past 10 years.  Fecal occult blood test (FOBT) of stool. / Every year beginning at age 40 years and continuing until age 27 years. You may not need to do this test if you get a colonoscopy every 10 years.  Flexible sigmoidoscopy or colonoscopy.** / Every 5 years for a flexible sigmoidoscopy or every 10 years for a colonoscopy beginning at age 7 years and continuing until age 32 years.  Hepatitis C blood test.** / For all people born from [birth year] through 1963 and any individual with known risks for hepatitis C.  Osteoporosis screening.** / A one-time screening for women ages 30 years and over and women at risk for fractures or osteoporosis.  Skin self-exam. / Monthly.  Influenza vaccine. / Every year.  Tetanus, diphtheria, and acellular pertussis (Tdap/Td)  vaccine.** / 1 dose of Td every 10 years.  Varicella vaccine.** / Consult your health care provider.  Zoster vaccine.** / 1 dose for adults aged 35 years or older.  Pneumococcal 13-valent conjugate (PCV13) vaccine.** / Consult your health care provider.  Pneumococcal polysaccharide (PPSV23) vaccine.** / 1 dose for all adults aged 46 years and older.  Meningococcal vaccine.** / Consult your health care provider.  Hepatitis A vaccine.** / Consult your health care provider.  Hepatitis B vaccine.** / Consult your health care provider.  Haemophilus influenzae type b (Hib) vaccine.** / Consult your health care provider. ** Family history and personal history of risk and conditions may change your health care provider's recommendations.   This information is not intended to replace advice given to you by your health care provider. Make sure you discuss any questions you have with your health care provider.   Document Released: 07/10/2001 Document Revised: 06/04/2014 Document Reviewed: 10/09/2010 Elsevier Interactive Patient Education [employer].

## 2015-08-09 ENCOUNTER — Telehealth: Payer: Self-pay | Admitting: Internal Medicine

## 2015-08-09 DIAGNOSIS — J069 Acute upper respiratory infection, unspecified: Secondary | ICD-10-CM

## 2015-08-09 DIAGNOSIS — B9789 Other viral agents as the cause of diseases classified elsewhere: Principal | ICD-10-CM

## 2015-08-09 DIAGNOSIS — B353 Tinea pedis: Secondary | ICD-10-CM

## 2015-08-09 MED ORDER — PROMETHAZINE HCL 12.5 MG PO TABS: 12.5000 mg | ORAL_TABLET | Freq: Four times a day (QID) | ORAL | Status: DC | PRN

## 2015-08-09 MED ORDER — AMOXICILLIN 875 MG PO TABS: 875.0000 mg | ORAL_TABLET | Freq: Two times a day (BID) | ORAL | Status: AC

## 2015-08-09 MED ORDER — TOLNAFTATE 1 % EX POWD: 1.0000 "application " | Freq: Two times a day (BID) | CUTANEOUS | Status: DC

## 2015-08-09 NOTE — Telephone Encounter (Signed)
rx sent, advise on antibiotics

## 2015-08-09 NOTE — Telephone Encounter (Signed)
Patient states she was seen yesterday.  She was not feeling any better and could not return to work today.  She is requesting a letter to be out of work today.

## 2015-08-09 NOTE — Telephone Encounter (Signed)
Pt needs her prescriptions from yesterday sent to [facility] [address] [place]   She also states she is feeling worse today and that Dr. Yetta BarreJones suggested an antibiotic.  She is hoping he can [place]

## 2015-08-09 NOTE — Telephone Encounter (Signed)
Pt informed

## 2015-08-09 NOTE — Telephone Encounter (Signed)
Rx sent 

## 2015-08-11 ENCOUNTER — Telehealth: Payer: Self-pay | Admitting: Internal Medicine

## 2015-08-11 NOTE — Telephone Encounter (Signed)
[facility] - Client TELEPHONE ADVICE RECORD [facility]  Patient Name: Traci Peterson  DOB: 1985-07-12    Initial Comment caller states she has been on abx - now has a yeast infection - pain and burning   Nurse Assessment  Nurse: Wisdom, RN, Susie Date/Time (Eastern Time): 08/11/2015 8:46:21 AM  Confirm and document reason for call. If symptomatic, describe symptoms. You must click the next button to save text entered. ---caller states she is on an antibiotic and yesterday developed a yeast infection; vaginal discharge is light, yellowish, brownish tint; has pain and burning on urination; no fever, nausea, vomiting or diarrhea;  Has the patient traveled out of the country within the last 30 days? ---Not Applicable  Does the patient have any new or worsening symptoms? ---Yes  Will a triage be completed? ---Yes  Related visit to physician within the last 2 weeks? ---Yes  Does the PT have any chronic conditions? (i.e. diabetes, asthma, etc.) ---No  Is the patient pregnant or possibly pregnant? (Ask all females between the ages of 5212-55) ---No  Is this a behavioral health or substance abuse call? ---No     Guidelines    Guideline Title Affirmed Question Affirmed Notes  Vaginal Discharge Symptoms of a vaginal yeast infection (i.e., white, thick, cottage-cheese-like, itchy, not bad smelling discharge)    Final Disposition User   Home Care Wisdom, RN, Susie    Comments  Please call - she is on an antibiotic, and currently having symptoms of yeast infection and possible urinary tract infection;   Referrals  REFERRED TO PCP OFFICE   Disagree/Comply: Comply

## 2015-08-15 ENCOUNTER — Telehealth: Payer: Self-pay | Admitting: Internal Medicine

## 2015-08-15 ENCOUNTER — Emergency Department (HOSPITAL_COMMUNITY)
Admission: EM | Admit: 2015-08-15 | Discharge: 2015-08-15 | Disposition: A | Discharge status: Home / Self Care | Payer: BLUE CROSS/BLUE SHIELD | Attending: Emergency Medicine | Admitting: Emergency Medicine

## 2015-08-15 ENCOUNTER — Emergency Department (HOSPITAL_COMMUNITY): Payer: BLUE CROSS/BLUE SHIELD

## 2015-08-15 DIAGNOSIS — F17210 Nicotine dependence, cigarettes, uncomplicated: Secondary | ICD-10-CM | POA: Insufficient documentation

## 2015-08-15 DIAGNOSIS — G8929 Other chronic pain: Secondary | ICD-10-CM | POA: Insufficient documentation

## 2015-08-15 DIAGNOSIS — Z79899 Other long term (current) drug therapy: Secondary | ICD-10-CM | POA: Insufficient documentation

## 2015-08-15 DIAGNOSIS — N390 Urinary tract infection, site not specified: Secondary | ICD-10-CM

## 2015-08-15 DIAGNOSIS — Z8742 Personal history of other diseases of the female genital tract: Secondary | ICD-10-CM | POA: Insufficient documentation

## 2015-08-15 DIAGNOSIS — R0602 Shortness of breath: Secondary | ICD-10-CM | POA: Insufficient documentation

## 2015-08-15 DIAGNOSIS — F419 Anxiety disorder, unspecified: Secondary | ICD-10-CM | POA: Insufficient documentation

## 2015-08-15 DIAGNOSIS — R109 Unspecified abdominal pain: Secondary | ICD-10-CM | POA: Diagnosis present

## 2015-08-15 DIAGNOSIS — Z3202 Encounter for pregnancy test, result negative: Secondary | ICD-10-CM | POA: Insufficient documentation

## 2015-08-15 DIAGNOSIS — Z872 Personal history of diseases of the skin and subcutaneous tissue: Secondary | ICD-10-CM | POA: Diagnosis not present

## 2015-08-15 LAB — URINE MICROSCOPIC-ADD ON

## 2015-08-15 LAB — URINALYSIS, ROUTINE W REFLEX MICROSCOPIC
BILIRUBIN URINE: NEGATIVE
Glucose, UA: NEGATIVE mg/dL
Ketones, ur: NEGATIVE mg/dL
Nitrite: NEGATIVE
PH: 6 (ref 5.0–8.0)
Protein, ur: 30 mg/dL — AB
SPECIFIC GRAVITY, URINE: 1.028 (ref 1.005–1.030)

## 2015-08-15 LAB — CBG MONITORING, ED: Glucose-Capillary: 87 mg/dL (ref 65–99)

## 2015-08-15 LAB — POC URINE PREG, ED: Preg Test, Ur: NEGATIVE

## 2015-08-15 MED ORDER — CIPROFLOXACIN HCL 500 MG PO TABS: 500.0000 mg | ORAL_TABLET | Freq: Two times a day (BID) | ORAL | Status: DC

## 2015-08-15 MED ORDER — FLUCONAZOLE 150 MG PO TABS: 150.0000 mg | ORAL_TABLET | Freq: Once | ORAL | Status: DC

## 2015-08-15 NOTE — ED Notes (Signed)
Pt reports shortness of breath started this AM, cough x 1 week. Co also chest pain / pt reports right flank pain x 1 day.  Dysuria. Pt was treated last week for URI and got Amoxicillin that pt took for two days only, had to stop treatment due to yeast infection and per PCP. Pt reports no relief from URI. Have also swelling, itching and burning vaginal area.

## 2015-08-15 NOTE — Discharge Instructions (Signed)
Follow-up with your family doctor next week °

## 2015-08-15 NOTE — Telephone Encounter (Signed)
FYI: Patient called back in regards to [facility] note on 3/16.  States she is at ED for yeast infection and UTI.  Also states she was not feeling any better from last OV.  States ER has done a x ray.  Wanted to know what she should do in regards to the long wait if we could work her in here.  Advised patient since she has already had chest x ray at ER she should continue her visit to get results and to get urine specimen but to give us a call back if she had any other questions or concerns.

## 2015-08-15 NOTE — ED Notes (Signed)
Pt aware that a urine sample is needed,pt sts she is unable to go at this time but will let staff know when she is able.

## 2015-08-15 NOTE — ED Provider Notes (Signed)
CSN: [id number]     Arrival date & time 08/15/15  1116 History   First MD Initiated Contact with Patient 08/15/15 1926     Chief Complaint  Patient presents with  . Shortness of Breath  . Flank Pain    right      (Consider location/radiation/quality/duration/timing/severity/associated sxs/prior Treatment) Patient is a 30 y.o. female presenting with shortness of breath and flank pain. The history is provided by the patient (Patient complains of dysuria. She's been on Timentin box recently for URI has also developed increased infection).  Shortness of Breath Severity:  Mild Onset quality:  Sudden Timing:  Constant Progression:  Worsening Chronicity:  New Context: not activity   Associated symptoms: no abdominal pain, no chest pain, no cough, no headaches and no rash   Flank Pain Associated symptoms include shortness of breath. Pertinent negatives include no chest pain, no abdominal pain and no headaches.    Past Medical History  Diagnosis Date  . Anxiety     no current med.  . Pilonidal cyst 02/2013  . Jaw snapping     states jaw pops if opens mouth too wide  . Menstrual bleeding problem     menses from 12/22/2012 - current (03/06/2013)  . Chronic headaches   . Ovarian cyst   . [redacted] weeks gestation of pregnancy    Past Surgical History  Procedure Laterality Date  . Wisdom tooth extraction    . Pilonidal cyst excision N/A 03/12/2013    Procedure: CYST EXCISION PILONIDAL EXTENSIVE;  Surgeon: Shelly Rubenstein, MD;  Location: [facility];  Service: General;  Laterality: N/A;   Family History  Problem Relation Age of Onset  . Hypertension Mother   . Hyperlipidemia Mother   . Diabetes Mother   . Thyroid disease Mother   . Diabetes Father   . Hypertension Father   . Alcohol abuse Father   . Breast cancer Maternal Aunt   . Lung cancer Paternal Grandfather    Social History  Substance Use Topics  . Smoking status: Current Every Day Smoker -- 0.50 packs/day  for 6 years    Types: Cigarettes    Last Attempt to Quit: 06/27/2013  . Smokeless tobacco: Never Used     Comment: 3-5 cig./day  . Alcohol Use: No     Comment: socially   OB History    Gravida Para Term Preterm AB TAB SAB Ectopic Multiple Living   0 0     Review of Systems  Constitutional: Negative for appetite change and fatigue.  HENT: Negative for congestion, ear discharge and sinus pressure.   Eyes: Negative for discharge.  Respiratory: Positive for shortness of breath. Negative for cough.   Cardiovascular: Negative for chest pain.  Gastrointestinal: Negative for abdominal pain and diarrhea.  Genitourinary: Positive for flank pain. Negative for frequency and hematuria.  Musculoskeletal: Negative for back pain.  Skin: Negative for rash.  Neurological: Negative for seizures and headaches.  Psychiatric/Behavioral: Negative for hallucinations.      Allergies  Review of patient's allergies indicates no active allergies.  Home Medications   Prior to Admission medications   Medication Sig Start Date End Date Taking? Authorizing Provider  citalopram (CELEXA) 20 MG tablet Take 20 mg by mouth at bedtime.    Yes Historical Provider, MD  etonogestrel-ethinyl estradiol (NUVARING) 0.12-0.015 MG/24HR vaginal ring Place 1 each vaginally every 28 (twenty-eight) days. Insert vaginally and leave in place for 3 consecutive weeks, then  remove for 1 week.   Yes Historical Provider, MD  naproxen (NAPROSYN) 500 MG tablet Take 500 mg by mouth 3 (three) times daily as needed (ovarian cyst pain.).   Yes Historical Provider, MD  nicotine (NICODERM CQ - DOSED IN MG/24 HR) 7 mg/24hr patch Place 7 mg onto the skin daily.   Yes Historical Provider, MD  tolnaftate (TINACTIN) 1 % powder Apply 1 application topically 2 (two) times daily. 08/09/15  Yes Etta Grandchild, MD  amoxicillin (AMOXIL) 875 MG tablet Take 1 tablet (875 mg total) by mouth 2 (two) times daily. Patient not taking: Reported on  08/15/2015 08/09/15 08/19/15  Etta Grandchild, MD  ciprofloxacin (CIPRO) 500 MG tablet Take 1 tablet (500 mg total) by mouth 2 (two) times daily. 08/15/15   Bethann Berkshire, MD  fluconazole (DIFLUCAN) 150 MG tablet Take 1 tablet (150 mg total) by mouth once. 08/15/15   Bethann Berkshire, MD  promethazine (PHENERGAN) 12.5 MG tablet Take 1 tablet (12.5 mg total) by mouth every 6 (six) hours as needed for nausea or vomiting. Patient not taking: Reported on 08/15/2015 08/09/15   Etta Grandchild, MD   BP 124/59 mmHg  Pulse 73  Temp(Src) 98.9 F (37.2 C) (Oral)  Resp 20  SpO2 99%  LMP 07/14/2015 Physical Exam  Constitutional: She is oriented to person, place, and time. She appears well-developed.  HENT:  Head: Normocephalic.  Eyes: Conjunctivae and EOM are normal. No scleral icterus.  Neck: Neck supple. No thyromegaly present.  Cardiovascular: Normal rate and regular rhythm.  Exam reveals no gallop and no friction rub.   No murmur heard. Pulmonary/Chest: No stridor. She has no wheezes. She has no rales. She exhibits no tenderness.  Abdominal: She exhibits no distension. There is no tenderness. There is no rebound.  Musculoskeletal: Normal range of motion. She exhibits no edema.  Lymphadenopathy:    She has no cervical adenopathy.  Neurological: She is oriented to person, place, and time. She exhibits normal muscle tone. Coordination normal.  Skin: No rash noted. No erythema.  Psychiatric: She has a normal mood and affect. Her behavior is normal.    ED Course  Procedures (including critical care time) Labs Review Labs Reviewed  URINALYSIS, ROUTINE W REFLEX MICROSCOPIC (NOT AT [facility]) - Abnormal; Notable for the following:    APPearance TURBID (*)    Hgb urine dipstick MODERATE (*)    Protein, ur 30 (*)    Leukocytes, UA LARGE (*)    All other components within normal limits  URINE MICROSCOPIC-ADD ON - Abnormal; Notable for the following:    Squamous Epithelial / LPF TOO NUMEROUS TO COUNT (*)     Bacteria, UA MANY (*)    All other components within normal limits  URINE CULTURE  POC URINE PREG, ED  CBG MONITORING, ED    Imaging Review Dg Chest 2 View  08/15/2015  CLINICAL DATA:  Cough, congestion and right side chest pain beginning last week. Initial encounter. EXAM: CHEST  2 VIEW COMPARISON:  PA and lateral chest 04/19/2015 and 08/08/2012. FINDINGS: The lungs are clear. Heart size is normal. No pneumothorax or pleural effusion. No bony abnormality hip is identified. IMPRESSION: Negative chest. Electronically Signed   By: Drusilla Kanner M.D.   On: 08/15/2015 12:12   I have personally reviewed and evaluated these images and lab results as part of my medical decision-making.   EKG Interpretation None      MDM   Final diagnoses:  UTI (lower urinary tract infection)  Patient with possible urinary tract infection. Will culture urine put her on Cipro also cover the patient with Diflucan for yeast infection and have her follow-up her PCP    Bethann BerkshireJoseph Kyland No, MD 08/15/15 2110

## 2015-08-17 LAB — URINE CULTURE: SPECIAL REQUESTS: NORMAL

## 2015-12-12 ENCOUNTER — Encounter (HOSPITAL_BASED_OUTPATIENT_CLINIC_OR_DEPARTMENT_OTHER): Payer: Self-pay | Admitting: *Deleted

## 2015-12-12 ENCOUNTER — Emergency Department (HOSPITAL_BASED_OUTPATIENT_CLINIC_OR_DEPARTMENT_OTHER)
Admission: EM | Admit: 2015-12-12 | Discharge: 2015-12-12 | Disposition: A | Discharge status: Home / Self Care | Payer: BLUE CROSS/BLUE SHIELD | Attending: Emergency Medicine | Admitting: Emergency Medicine

## 2015-12-12 DIAGNOSIS — F17210 Nicotine dependence, cigarettes, uncomplicated: Secondary | ICD-10-CM | POA: Insufficient documentation

## 2015-12-12 DIAGNOSIS — R102 Pelvic and perineal pain: Secondary | ICD-10-CM | POA: Insufficient documentation

## 2015-12-12 DIAGNOSIS — N760 Acute vaginitis: Secondary | ICD-10-CM

## 2015-12-12 DIAGNOSIS — B9689 Other specified bacterial agents as the cause of diseases classified elsewhere: Secondary | ICD-10-CM

## 2015-12-12 LAB — PREGNANCY, URINE: Preg Test, Ur: NEGATIVE

## 2015-12-12 LAB — WET PREP, GENITAL
SPERM: NONE SEEN
Trich, Wet Prep: NONE SEEN
Yeast Wet Prep HPF POC: NONE SEEN

## 2015-12-12 LAB — URINALYSIS, ROUTINE W REFLEX MICROSCOPIC
BILIRUBIN URINE: NEGATIVE
GLUCOSE, UA: NEGATIVE mg/dL
Hgb urine dipstick: NEGATIVE
KETONES UR: NEGATIVE mg/dL
Leukocytes, UA: NEGATIVE
Nitrite: NEGATIVE
PH: 6 (ref 5.0–8.0)
Protein, ur: NEGATIVE mg/dL
SPECIFIC GRAVITY, URINE: 1.025 (ref 1.005–1.030)

## 2015-12-12 MED ORDER — AZITHROMYCIN 250 MG PO TABS
1000.0000 mg | ORAL_TABLET | Freq: Once | ORAL | Status: AC
  Administered 2015-12-12: 1000 mg via ORAL
  Filled 2015-12-12: qty 4

## 2015-12-12 MED ORDER — LIDOCAINE HCL (PF) 1 % IJ SOLN
INTRAMUSCULAR | Status: AC
  Administered 2015-12-12: 5 mL
  Filled 2015-12-12: qty 5

## 2015-12-12 MED ORDER — METRONIDAZOLE 500 MG PO TABS: 500.0000 mg | ORAL_TABLET | Freq: Two times a day (BID) | ORAL | Status: DC

## 2015-12-12 MED ORDER — LIDOCAINE HCL 2 % IJ SOLN: 5.0000 mL | INTRAMUSCULAR | Status: DC

## 2015-12-12 MED ORDER — CEFTRIAXONE SODIUM 250 MG IJ SOLR
250.0000 mg | Freq: Once | INTRAMUSCULAR | Status: AC
  Administered 2015-12-12: 250 mg via INTRAMUSCULAR
  Filled 2015-12-12: qty 250

## 2015-12-12 NOTE — ED Provider Notes (Signed)
CSN: [id number]     Arrival date & time 12/12/15  1057 History   First MD Initiated Contact with Patient 12/12/15 1231     Chief Complaint  Patient presents with  . Abdominal Pain     (Consider location/radiation/quality/duration/timing/severity/associated sxs/prior Treatment) Patient is a 30 y.o. female presenting with abdominal pain. The history is provided by the patient.  Abdominal Pain Pain location:  Suprapubic Pain quality: aching and throbbing   Pain radiates to:  Does not radiate Pain severity:  Moderate Onset quality:  Gradual Duration:  2 weeks Timing:  Constant Progression:  Worsening Chronicity:  New Context: not recent illness and not sick contacts   Relieved by:  Nothing Worsened by:  Nothing tried Ineffective treatments:  None tried Associated symptoms: vaginal discharge   Associated symptoms: no chest pain, no chills, no constipation, no cough, no fatigue, no fever, no nausea, no shortness of breath, no vaginal bleeding and no vomiting   Risk factors: obesity     Past Medical History  Diagnosis Date  . Anxiety     no current med.  . Pilonidal cyst 02/2013  . Jaw snapping     states jaw pops if opens mouth too wide  . Menstrual bleeding problem     menses from 12/22/2012 - current (03/06/2013)  . Chronic headaches   . Ovarian cyst   . [redacted] weeks gestation of pregnancy    Past Surgical History  Procedure Laterality Date  . Wisdom tooth extraction    . Pilonidal cyst excision N/A 03/12/2013    Procedure: CYST EXCISION PILONIDAL EXTENSIVE;  Surgeon: Shelly Rubenstein, MD;  Location: [facility];  Service: General;  Laterality: N/A;   Family History  Problem Relation Age of Onset  . Hypertension Mother   . Hyperlipidemia Mother   . Diabetes Mother   . Thyroid disease Mother   . Diabetes Father   . Hypertension Father   . Alcohol abuse Father   . Breast cancer Maternal Aunt   . Lung cancer Paternal Grandfather    Social History   Substance Use Topics  . Smoking status: Current Every Day Smoker -- 0.50 packs/day for 6 years    Types: Cigarettes    Last Attempt to Quit: 06/27/2013  . Smokeless tobacco: Never Used     Comment: 3-5 cig./day  . Alcohol Use: No     Comment: socially   OB History    Gravida Para Term Preterm AB TAB SAB Ectopic Multiple Living   1 1  1      0 0     Review of Systems  Constitutional: Negative for fever, chills and fatigue.  HENT: Negative for congestion and rhinorrhea.   Eyes: Negative for visual disturbance.  Respiratory: Negative for cough, shortness of breath and wheezing.   Cardiovascular: Negative for chest pain and leg swelling.  Gastrointestinal: Positive for abdominal pain. Negative for nausea, vomiting and constipation.  Genitourinary: Positive for vaginal discharge and pelvic pain. Negative for vaginal bleeding.  Musculoskeletal: Negative for neck pain and neck stiffness.  Skin: Negative for rash.  Allergic/Immunologic: Negative for immunocompromised state.  Neurological: Negative for syncope, weakness and numbness.      Allergies  Review of patient's allergies indicates no active allergies.  Home Medications   Prior to Admission medications   Medication Sig Start Date End Date Taking? Authorizing Provider  ciprofloxacin (CIPRO) 500 MG tablet Take 1 tablet (500 mg total) by mouth 2 (two) times daily. 08/15/15   Bethann Berkshire,  MD  citalopram (CELEXA) 20 MG tablet Take 20 mg by mouth at bedtime.     Historical Provider, MD  etonogestrel-ethinyl estradiol (NUVARING) 0.12-0.015 MG/24HR vaginal ring Place 1 each vaginally every 28 (twenty-eight) days. Insert vaginally and leave in place for 3 consecutive weeks, then remove for 1 week.    Historical Provider, MD  fluconazole (DIFLUCAN) 150 MG tablet Take 1 tablet (150 mg total) by mouth once. 08/15/15   Bethann BerkshireJoseph Zammit, MD  naproxen (NAPROSYN) 500 MG tablet Take 500 mg by mouth 3 (three) times daily as needed (ovarian cyst  pain.).    Historical Provider, MD  nicotine (NICODERM CQ - DOSED IN MG/24 HR) 7 mg/24hr patch Place 7 mg onto the skin daily.    Historical Provider, MD  promethazine (PHENERGAN) 12.5 MG tablet Take 1 tablet (12.5 mg total) by mouth every 6 (six) hours as needed for nausea or vomiting. Patient not taking: Reported on 08/15/2015 08/09/15   Etta Grandchildhomas L Jones, MD  tolnaftate (TINACTIN) 1 % powder Apply 1 application topically 2 (two) times daily. 08/09/15   Etta Grandchildhomas L Jones, MD   BP 96/57 mmHg  Pulse 83  Temp(Src) 98.8 F (37.1 C) (Oral)  Resp 18  Ht 5\' 2"  (1.575 m)  Wt 256 lb (116.121 kg)  BMI 46.81 kg/m2  SpO2 100%  LMP 11/19/2015 Physical Exam  Constitutional: She is oriented to person, place, and time. She appears well-developed and well-nourished. No distress.  HENT:  Head: Normocephalic.  Mouth/Throat: No oropharyngeal exudate.  Eyes: Conjunctivae are normal. Pupils are equal, round, and reactive to light.  Neck: Normal range of motion. Neck supple.  Cardiovascular: Normal rate, regular rhythm, normal heart sounds and intact distal pulses.  Exam reveals no friction rub.   No murmur heard. Pulmonary/Chest: Effort normal and breath sounds normal.  Abdominal: Soft. She exhibits no distension. There is tenderness in the suprapubic area. There is no rigidity, no guarding, no tenderness at McBurney's point and negative Murphy's sign.  Genitourinary: Uterus normal. There is no rash or lesion on the right labia. There is no rash or lesion on the left labia. Cervix exhibits friability. Cervix exhibits no motion tenderness and no discharge. There is tenderness in the vagina. Vaginal discharge found.  Musculoskeletal: She exhibits no edema.  Neurological: She is alert and oriented to person, place, and time.  Skin: Skin is warm. No rash noted.  Nursing note and vitals reviewed.   ED Course  Procedures (including critical care time) Labs Review Labs Reviewed  WET PREP, GENITAL - Abnormal;  Notable for the following:    Clue Cells Wet Prep HPF POC PRESENT (*)    WBC, Wet Prep HPF POC MANY (*)    All other components within normal limits  URINALYSIS, ROUTINE W REFLEX MICROSCOPIC (NOT AT [facility]) - Abnormal; Notable for the following:    APPearance CLOUDY (*)    All other components within normal limits  PREGNANCY, URINE  GC/CHLAMYDIA PROBE AMP ([place]) NOT AT [facility]    Imaging Review No results found. I have personally reviewed and evaluated these images and lab results as part of my medical decision-making.   EKG Interpretation None      MDM  30 yo AAF with no significant PMHx who p/w mild suprapubic/pelvic pressure x 2 weeks, vaginal discharge in setting of attempting to become pregnancy with husband. No fever or chills. VSS and WNL on arrival. Exam is as above. UPT negative, UA unremarkable w/o signs of infection. Wet prep + for  clue cells and WBCs. Exam, history is c/f possible mild cervicitis, BV with subsequent pelvic pressure/irritation. No CMT, fever, uterine TTP, and pt overall well-appearing - do not suspect acute PID. Pain is gradual in onset, midline suprapubic, and with no adnexal TTP - do not suspect ovarian torsion, TOA, or other pathology. Pt has no anorexia, fevers, RLQ TTP, or signs of appendicitis. Will treat for cervicitis, cover for BV and refer for outpt management. Discussed possibility of STI with pt, who is aware. Safe sex precautions given. Will d/c home.  Clinical Impression: 1. Bacterial vaginosis     Disposition: Discharge  Condition: Good  I have discussed the results, Dx and Tx plan with the pt(& family if present). He/she/they expressed understanding and agree(s) with the plan. Discharge instructions discussed at great length. Strict return precautions discussed and pt &/or family have verbalized understanding of the instructions. No further questions at time of discharge.    Discharge Medication List as of 12/12/2015  3:00 PM    START  taking these medications   Details  metroNIDAZOLE (FLAGYL) 500 MG tablet Take 1 tablet (500 mg total) by mouth 2 (two) times daily., Starting 12/12/2015, Until Discontinued, Print        Follow Up: Etta Grandchild, MD [address] [place] [address] [phone]   Follow-up in 3-5 days as needed or if symptoms worsen     Shaune Pollack, MD 12/12/15 1949

## 2015-12-12 NOTE — ED Notes (Signed)
Pt waiting in room for 15 min post antibiotic.

## 2015-12-12 NOTE — Discharge Instructions (Signed)

## 2015-12-12 NOTE — ED Notes (Signed)
Abdominal pain x 2 weeks. She had a negative pregnancy test this am.

## 2015-12-12 NOTE — ED Notes (Signed)
MD at bedside. 

## 2015-12-13 LAB — GC/CHLAMYDIA PROBE AMP (~~LOC~~) NOT AT ARMC
CHLAMYDIA, DNA PROBE: NEGATIVE
NEISSERIA GONORRHEA: NEGATIVE

## 2016-01-16 ENCOUNTER — Emergency Department (HOSPITAL_BASED_OUTPATIENT_CLINIC_OR_DEPARTMENT_OTHER)
Admission: EM | Admit: 2016-01-16 | Discharge: 2016-01-16 | Disposition: A | Discharge status: Home / Self Care | Payer: Self-pay | Attending: Emergency Medicine | Admitting: Emergency Medicine

## 2016-01-16 ENCOUNTER — Encounter (HOSPITAL_BASED_OUTPATIENT_CLINIC_OR_DEPARTMENT_OTHER): Payer: Self-pay | Admitting: Emergency Medicine

## 2016-01-16 ENCOUNTER — Emergency Department (HOSPITAL_BASED_OUTPATIENT_CLINIC_OR_DEPARTMENT_OTHER): Payer: Self-pay

## 2016-01-16 DIAGNOSIS — R109 Unspecified abdominal pain: Secondary | ICD-10-CM

## 2016-01-16 DIAGNOSIS — R0602 Shortness of breath: Secondary | ICD-10-CM | POA: Insufficient documentation

## 2016-01-16 DIAGNOSIS — F17210 Nicotine dependence, cigarettes, uncomplicated: Secondary | ICD-10-CM | POA: Insufficient documentation

## 2016-01-16 DIAGNOSIS — N898 Other specified noninflammatory disorders of vagina: Secondary | ICD-10-CM | POA: Insufficient documentation

## 2016-01-16 DIAGNOSIS — R1011 Right upper quadrant pain: Secondary | ICD-10-CM

## 2016-01-16 DIAGNOSIS — K8050 Calculus of bile duct without cholangitis or cholecystitis without obstruction: Secondary | ICD-10-CM

## 2016-01-16 LAB — COMPREHENSIVE METABOLIC PANEL
ALK PHOS: 59 U/L (ref 38–126)
ALT: 58 U/L — AB (ref 14–54)
AST: 35 U/L (ref 15–41)
Albumin: 4.2 g/dL (ref 3.5–5.0)
Anion gap: 8 (ref 5–15)
BUN: 10 mg/dL (ref 6–20)
CALCIUM: 8.5 mg/dL — AB (ref 8.9–10.3)
CHLORIDE: 107 mmol/L (ref 101–111)
CO2: 23 mmol/L (ref 22–32)
CREATININE: 0.76 mg/dL (ref 0.44–1.00)
Glucose, Bld: 136 mg/dL — ABNORMAL HIGH (ref 65–99)
Potassium: 3.2 mmol/L — ABNORMAL LOW (ref 3.5–5.1)
Sodium: 138 mmol/L (ref 135–145)
Total Bilirubin: 0.3 mg/dL (ref 0.3–1.2)
Total Protein: 7.6 g/dL (ref 6.5–8.1)

## 2016-01-16 LAB — URINALYSIS, ROUTINE W REFLEX MICROSCOPIC
BILIRUBIN URINE: NEGATIVE
GLUCOSE, UA: NEGATIVE mg/dL
KETONES UR: NEGATIVE mg/dL
Nitrite: NEGATIVE
PH: 6 (ref 5.0–8.0)
Protein, ur: 30 mg/dL — AB
SPECIFIC GRAVITY, URINE: 1.023 (ref 1.005–1.030)

## 2016-01-16 LAB — CBC
HCT: 41.3 % (ref 36.0–46.0)
Hemoglobin: 13.9 g/dL (ref 12.0–15.0)
MCH: 29.8 pg (ref 26.0–34.0)
MCHC: 33.7 g/dL (ref 30.0–36.0)
MCV: 88.4 fL (ref 78.0–100.0)
PLATELETS: 245 10*3/uL (ref 150–400)
RBC: 4.67 MIL/uL (ref 3.87–5.11)
RDW: 13.9 % (ref 11.5–15.5)
WBC: 7.4 10*3/uL (ref 4.0–10.5)

## 2016-01-16 LAB — URINE MICROSCOPIC-ADD ON

## 2016-01-16 LAB — LIPASE, BLOOD: LIPASE: 28 U/L (ref 11–51)

## 2016-01-16 LAB — PREGNANCY, URINE: Preg Test, Ur: NEGATIVE

## 2016-01-16 MED ORDER — ONDANSETRON HCL 4 MG/2ML IJ SOLN
4.0000 mg | Freq: Once | INTRAMUSCULAR | Status: AC | PRN
  Administered 2016-01-16: 4 mg via INTRAVENOUS
  Filled 2016-01-16: qty 2

## 2016-01-16 MED ORDER — ONDANSETRON HCL 4 MG/2ML IJ SOLN
4.0000 mg | Freq: Once | INTRAMUSCULAR | Status: AC
  Administered 2016-01-16: 4 mg via INTRAVENOUS
  Filled 2016-01-16: qty 2

## 2016-01-16 MED ORDER — FENTANYL CITRATE (PF) 100 MCG/2ML IJ SOLN
100.0000 ug | Freq: Once | INTRAMUSCULAR | Status: AC
  Administered 2016-01-16: 100 ug via INTRAVENOUS
  Filled 2016-01-16: qty 2

## 2016-01-16 MED ORDER — IBUPROFEN 400 MG PO TABS
600.0000 mg | ORAL_TABLET | Freq: Once | ORAL | Status: AC
  Administered 2016-01-16: 600 mg via ORAL
  Filled 2016-01-16: qty 1

## 2016-01-16 MED ORDER — METOCLOPRAMIDE HCL 10 MG PO TABS
10.0000 mg | ORAL_TABLET | Freq: Four times a day (QID) | ORAL | 0 refills | Status: DC | PRN
Start: 2016-01-16 — End: 2016-05-30

## 2016-01-16 MED ORDER — IBUPROFEN 600 MG PO TABS: 600.0000 mg | ORAL_TABLET | Freq: Four times a day (QID) | ORAL | 0 refills | Status: DC | PRN

## 2016-01-16 MED ORDER — POTASSIUM CHLORIDE CRYS ER 20 MEQ PO TBCR
40.0000 meq | EXTENDED_RELEASE_TABLET | Freq: Once | ORAL | Status: AC
  Administered 2016-01-16: 40 meq via ORAL
  Filled 2016-01-16: qty 2

## 2016-01-16 MED FILL — IBUPROFEN 600 MG TABLET: 600 | 7 days supply | Qty: 28 | Fill #0

## 2016-01-16 MED FILL — METOCLOPRAMIDE 10 MG TABLET: 10 | 4 days supply | Qty: 15 | Fill #0

## 2016-01-16 NOTE — Discharge Instructions (Signed)
Avoid greasy fried and fatty foods. Take the medications prescribed as needed for pain or nausea. Call [facility] to arrange to be seen in the office to discuss taking her gallbladder out. Call [facility] to get a primary care physician.

## 2016-01-16 NOTE — ED Provider Notes (Addendum)
Plains of right upper quadrant pain for the past 2 weeks. Symptoms accompanied by multiple vomiting. She states that presently pain is mild to moderate. She has no nausea present. Plan avoid greasy and fried and fatty foods. [facility] to get primary care physician [facility]. Patient is alert and in no distress. Of note patient is currently on her menses accompanying for microscopic hematuria  Results for orders placed or performed during the hospital encounter of 01/16/16  Urinalysis, Routine w reflex microscopic (not at [facility])  Result Value Ref Range   Color, Urine RED (A) YELLOW   APPearance TURBID (A) CLEAR   Specific Gravity, Urine 1.023 1.005 - 1.030   pH 6.0 5.0 - 8.0   Glucose, UA NEGATIVE NEGATIVE mg/dL   Hgb urine dipstick LARGE (A) NEGATIVE   Bilirubin Urine NEGATIVE NEGATIVE   Ketones, ur NEGATIVE NEGATIVE mg/dL   Protein, ur 30 (A) NEGATIVE mg/dL   Nitrite NEGATIVE NEGATIVE   Leukocytes, UA SMALL (A) NEGATIVE  Pregnancy, urine  Result Value Ref Range   Preg Test, Ur NEGATIVE NEGATIVE  Lipase, blood  Result Value Ref Range   Lipase 28 11 - 51 U/L  Comprehensive metabolic panel  Result Value Ref Range   Sodium 138 135 - 145 mmol/L   Potassium 3.2 (L) 3.5 - 5.1 mmol/L   Chloride 107 101 - 111 mmol/L   CO2 23 22 - 32 mmol/L   Glucose, Bld 136 (H) 65 - 99 mg/dL   BUN 10 6 - 20 mg/dL   Creatinine, Ser 1.610.76 0.44 - 1.00 mg/dL   Calcium 8.5 (L) 8.9 - 10.3 mg/dL   Total Protein 7.6 6.5 - 8.1 g/dL   Albumin 4.2 3.5 - 5.0 g/dL   AST 35 15 - 41 U/L   ALT 58 (H) 14 - 54 U/L   Alkaline Phosphatase 59 38 - 126 U/L   Total Bilirubin 0.3 0.3 - 1.2 mg/dL   GFR calc non Af Amer >60 >60 mL/min   GFR calc Af Amer >60 >60 mL/min   Anion gap 8 5 - 15  CBC  Result Value Ref Range   WBC 7.4 4.0 - 10.5 K/uL   RBC 4.67 3.87 - 5.11 MIL/uL   Hemoglobin 13.9 12.0 - 15.0 g/dL   HCT 09.641.3 04.536.0 - 40.946.0 %   MCV 88.4 78.0 - 100.0 fL   MCH 29.8 26.0 - 34.0 pg   MCHC 33.7 30.0 - 36.0 g/dL   RDW 81.113.9 91.411.5 - 78.215.5 %   Platelets 245 150 - 400 K/uL  Urine microscopic-add on  Result Value Ref Range   Squamous Epithelial / LPF 0-5 (A) NONE SEEN   WBC, UA 0-5 0 - 5 WBC/hpf   RBC / HPF TOO NUMEROUS TO COUNT 0 - 5 RBC/hpf   Bacteria, UA FEW (A) NONE SEEN   Urine-Other MUCOUS PRESENT    [place] Abdomen Limited Ruq  Result Date: 01/16/2016 CLINICAL DATA:  Right quadrant pain. EXAM: US ABDOMEN LIMITED - RIGHT UPPER QUADRANT COMPARISON:  CT 12/25/2011. FINDINGS: Gallbladder: Multiple gallstones. Largest measures 6 mm. Gallbladder wall thickness normal at 2 mm. Negative Murphy sign. No pericholecystic fluid collection. Common bile duct: Diameter: 3 mm Liver: There is echogenic consistent with fatty infiltration. No focal hepatic abnormality identified . IMPRESSION: 1. Multiple gallstones. Negative Murphy sign. No biliary distention. 2. Liver is echogenic consistent fatty infiltration. Electronically Signed   By: Maisie Fushomas  Register  On: 01/16/2016 08:20  Plan prescriptions ibuprofen, Reglan Diagnoses #1 biliary colic #2 hypokalemia   Doug SouSam Kihanna Kamiya, MD 01/16/16 [id number]    Doug SouSam Damarco Keysor, MD 01/16/16 [phone]

## 2016-01-16 NOTE — ED Notes (Signed)
Patient in ultrasound.

## 2016-01-16 NOTE — ED Notes (Signed)
MD at bedside discussing results with patient at this time. 

## 2016-01-16 NOTE — ED Triage Notes (Signed)
Pt states she started having abd pain about 20 mins ago and having SOB.  Pt appears in pain in triage but breathing good

## 2016-01-16 NOTE — ED Provider Notes (Signed)
[facility] Provider Note   CSN: [id number] Arrival date & time: 01/16/16  0027     History   Chief Complaint Chief Complaint  Patient presents with  . Abdominal Pain    HPI Traci Peterson is a 30 y.o. female.  The history is provided by the patient.  Abdominal Pain   This is a new problem. Episode onset: just prior to arrival. The problem occurs constantly. The problem has been gradually worsening. The pain is associated with eating. The pain is located in the RUQ. The pain is moderate. Pertinent negatives include fever and dysuria. The symptoms are aggravated by palpation. Nothing relieves the symptoms.  Patient reports onset of RUQ pain and "popping" just prior to arrival with associated vomiting No fever No cp She reports due to abd pain she feels short of breath She has had this previously but this feels worse than prior attacks   Past Medical History:  Diagnosis Date  . [redacted] weeks gestation of pregnancy   . Anxiety    no current med.  . Chronic headaches   . Jaw snapping    states jaw pops if opens mouth too wide  . Menstrual bleeding problem    menses from 12/22/2012 - current (03/06/2013)  . Ovarian cyst   . Pilonidal cyst 02/2013    Patient Active Problem List   Diagnosis Date Noted  . Routine general medical examination at a health care facility 08/08/2015  . Tinea pedis, recurrent 08/08/2015  . Pernicious anemia 08/08/2015  . Viral URI with cough 08/08/2015  . Tinea pedis of both feet 08/08/2015  . Morbidly obese (HCC) 10/25/2014  . Eczema 10/25/2014  . 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 Chronic headaches -  no meds 10/25/2014  . Anxiety and depression 11/30/2013  . Tobacco abuse 05/25/2013  . Atypical squamous cells of undetermined significance with positive high risk human papillomavirus 05/08/2013  . PCOS (polycystic ovarian syndrome) 04/20/2013     Past Surgical History:  Procedure Laterality Date  . PILONIDAL CYST EXCISION N/A 03/12/2013   Procedure: CYST EXCISION PILONIDAL EXTENSIVE;  Surgeon: Shelly Rubenstein, MD;  Location: [facility];  Service: General;  Laterality: N/A;  . WISDOM TOOTH EXTRACTION      OB History    Gravida Para Term Preterm AB Living   1 1   1    0   SAB TAB Ectopic Multiple Live Births         0         Home Medications    Prior to Admission medications   Medication Sig Start Date End Date Taking? Authorizing Provider  citalopram (CELEXA) 20 MG tablet Take 20 mg by mouth at bedtime.     Historical Provider, MD  etonogestrel-ethinyl estradiol (NUVARING) 0.12-0.015 MG/24HR vaginal ring Place 1 each vaginally every 28 (twenty-eight) days. Insert vaginally and leave in place for 3 consecutive weeks, then remove for 1 week.    Historical Provider, MD  naproxen (NAPROSYN) 500 MG tablet Take 500 mg by mouth 3 (three) times daily as needed (ovarian cyst pain.).    Historical Provider, MD  nicotine (NICODERM CQ - DOSED IN MG/24 HR) 7 mg/24hr patch Place 7 mg onto the skin daily.    Historical Provider, MD  tolnaftate (TINACTIN) 1 % powder Apply 1 application topically 2 (two) times daily. 08/09/15   Etta Grandchild, MD    Family  History Family History  Problem Relation Age of Onset  . Hypertension Mother   . Hyperlipidemia Mother   . Diabetes Mother   . Thyroid disease Mother   . Diabetes Father   . Hypertension Father   . Alcohol abuse Father   . Breast cancer Maternal Aunt   . Lung cancer Paternal Grandfather     Social History Social History  Substance Use Topics  . Smoking status: Current Every Day Smoker    Packs/day: 0.50    Years: 6.00    Types: Cigarettes    Last attempt to quit: 06/27/2013  . Smokeless tobacco: Never Used     Comment: 3-5 cig./day  . Alcohol use No     Comment: socially     Allergies   Review of patient's allergies indicates no known  allergies.   Review of Systems Review of Systems  Constitutional: Negative for fever.  Respiratory: Positive for shortness of breath.   Cardiovascular: Negative for chest pain.  Gastrointestinal: Positive for abdominal pain.  Genitourinary: Positive for vaginal discharge. Negative for dysuria.       Vaginal bleeding from period   All other systems reviewed and are negative.    Physical Exam Updated Vital Signs BP 107/68 (BP Location: Right Arm)   Pulse 84   Temp 97.8 F (36.6 C) (Oral)   Resp 18   Ht 5\' 2"  (1.575 m)   Wt 116.1 kg   LMP 01/16/2016   SpO2 100%   BMI 46.82 kg/m   Physical Exam CONSTITUTIONAL: Well developed/well nourished HEAD: Normocephalic/atraumatic EYES: EOMI/PERRL, no icterus ENMT: Mucous membranes moist NECK: supple no meningeal signs SPINE/BACK:entire spine nontender CV: S1/S2 noted, no murmurs/rubs/gallops noted LUNGS: Lungs are clear to auscultation bilaterally, no apparent distress ABDOMEN: soft, moderate RUQ tenderness, no rebound or guarding, bowel sounds noted throughout abdomen GU:no cva tenderness NEURO: Pt is awake/alert/appropriate, moves all extremitiesx4.  No facial droop.   EXTREMITIES: pulses normal/equal, full ROM SKIN: warm, color normal PSYCH: no abnormalities of mood noted, alert and oriented to situation    ED Treatments / Results  Labs (all labs ordered are listed, but only abnormal results are displayed) Labs Reviewed  URINALYSIS, ROUTINE W REFLEX MICROSCOPIC (NOT AT [facility]) - Abnormal; Notable for the following:       Result Value   Color, Urine RED (*)    APPearance TURBID (*)    Hgb urine dipstick LARGE (*)    Protein, ur 30 (*)    Leukocytes, UA SMALL (*)    All other components within normal limits  COMPREHENSIVE METABOLIC PANEL - Abnormal; Notable for the following:    Potassium 3.2 (*)    Glucose, Bld 136 (*)    Calcium 8.5 (*)    ALT 58 (*)    All other components within normal limits  URINE  MICROSCOPIC-ADD ON - Abnormal; Notable for the following:    Squamous Epithelial / LPF 0-5 (*)    Bacteria, UA FEW (*)    All other components within normal limits  PREGNANCY, URINE  LIPASE, BLOOD  CBC    EKG  EKG Interpretation None       Radiology No results found.  Procedures Procedures (including critical care time)  Medications Ordered in ED Medications  ondansetron (ZOFRAN) injection 4 mg (4 mg Intravenous Given 01/16/16 0235)  fentaNYL (SUBLIMAZE) injection 100 mcg (100 mcg Intravenous Given 01/16/16 0651)  ondansetron (ZOFRAN) injection 4 mg (4 mg Intravenous Given 01/16/16 0651)     Initial Impression /  Assessment and Plan / ED Course  I have reviewed the triage vital signs and the nursing notes.  Pertinent labs  results that were available during my care of the patient were reviewed by me and considered in my medical decision making (see chart for details).  Clinical Course    Pt with acute onset of RUQ abd pain and vomiting Given history/exam, cholelithiasis/cholecystitis considered No other focal abdominal tenderness Will obtain emergent abdominal ultrasound Patient agreeable with plan  7:12 AM Signed out to dr Ethelda Chickjacubowitz to f/u on US imaging  Final Clinical Impressions(s) / ED Diagnoses   Final diagnoses:  Abdominal pain  RUQ pain    New Prescriptions New Prescriptions   No medications on file     Zadie Rhineonald Riyana Biel, MD 01/16/16 [phone]

## 2016-02-07 ENCOUNTER — Emergency Department (HOSPITAL_BASED_OUTPATIENT_CLINIC_OR_DEPARTMENT_OTHER)
Admission: EM | Admit: 2016-02-07 | Discharge: 2016-02-07 | Disposition: A | Discharge status: Home / Self Care | Payer: Self-pay | Attending: Emergency Medicine | Admitting: Emergency Medicine

## 2016-02-07 ENCOUNTER — Encounter (HOSPITAL_BASED_OUTPATIENT_CLINIC_OR_DEPARTMENT_OTHER): Payer: Self-pay

## 2016-02-07 DIAGNOSIS — F17210 Nicotine dependence, cigarettes, uncomplicated: Secondary | ICD-10-CM | POA: Insufficient documentation

## 2016-02-07 DIAGNOSIS — K8050 Calculus of bile duct without cholangitis or cholecystitis without obstruction: Secondary | ICD-10-CM

## 2016-02-07 DIAGNOSIS — K8051 Calculus of bile duct without cholangitis or cholecystitis with obstruction: Secondary | ICD-10-CM | POA: Insufficient documentation

## 2016-02-07 MED ORDER — ONDANSETRON HCL 4 MG/2ML IJ SOLN
4.0000 mg | Freq: Once | INTRAMUSCULAR | Status: AC
  Administered 2016-02-07: 4 mg via INTRAVENOUS
  Filled 2016-02-07: qty 2

## 2016-02-07 MED ORDER — FENTANYL CITRATE (PF) 100 MCG/2ML IJ SOLN
100.0000 ug | Freq: Once | INTRAMUSCULAR | Status: AC
  Administered 2016-02-07: 100 ug via INTRAVENOUS
  Filled 2016-02-07: qty 2

## 2016-02-07 MED ORDER — HYDROCODONE-ACETAMINOPHEN 5-325 MG PO TABS: 1.0000 | ORAL_TABLET | Freq: Four times a day (QID) | ORAL | 0 refills | Status: DC | PRN

## 2016-02-07 NOTE — ED Provider Notes (Signed)
[facility] Provider Note: Lowella DellJ. Lane Knoah Nedeau, MD, FACEP  CSN: [id number] MRN: [id number] ARRIVAL: 02/07/16 at 0250   CHIEF COMPLAINT  Abdominal Pain   HISTORY OF PRESENT ILLNESS  Traci Peterson is a 30 y.o. female with known gallstones. She is here with right upper quadrant pain that began about 20 minutes ago. The onset was sudden. She describes the pain as moderate to severe. It is worse with movement or palpation. There is associated nausea but no vomiting or diarrhea. Pain is characterized as like previous biliary colic. She has been referred to surgery but does not have insurance.  She last ate about 4 PM yesterday. She had a hamburger. She states that her episodes of biliary colic do not seem to coincide with when she eats. She was advised against eating fatty foods as these can trigger gallbladder spasms.   Past Medical History:  Diagnosis Date  . Anxiety    no current med.  . Chronic headaches   . Jaw snapping    states jaw pops if opens mouth too wide  . Menstrual bleeding problem    menses from 12/22/2012 - current (03/06/2013)  . Ovarian cyst   . Pilonidal cyst 02/2013    Past Surgical History:  Procedure Laterality Date  . PILONIDAL CYST EXCISION N/A 03/12/2013   Procedure: CYST EXCISION PILONIDAL EXTENSIVE;  Surgeon: Shelly Rubensteinouglas A Blackman, MD;  Location: [facility];  Service: General;  Laterality: N/A;  . WISDOM TOOTH EXTRACTION      Family History  Problem Relation Age of Onset  . Hypertension Mother   . Hyperlipidemia Mother   . Diabetes Mother   . Thyroid disease Mother   . Diabetes Father   . Hypertension Father   . Alcohol abuse Father   . Breast cancer Maternal Aunt   . Lung cancer Paternal Grandfather     Social History  Substance Use Topics  . Smoking status: Current Every Day Smoker    Packs/day: 0.50    Years: 6.00    Types: Cigarettes    Last attempt to quit: 06/27/2013  . Smokeless tobacco: Never Used   Comment: 3-5 cig./day  . Alcohol use No     Comment: socially    Prior to Admission medications   Medication Sig Start Date End Date Taking? Authorizing Provider  citalopram (CELEXA) 20 MG tablet Take 20 mg by mouth at bedtime.     Historical Provider, MD  etonogestrel-ethinyl estradiol (NUVARING) 0.12-0.015 MG/24HR vaginal ring Place 1 each vaginally every 28 (twenty-eight) days. Insert vaginally and leave in place for 3 consecutive weeks, then remove for 1 week.    Historical Provider, MD  ibuprofen (ADVIL,MOTRIN) 600 MG tablet Take 1 tablet (600 mg total) by mouth every 6 (six) hours as needed for moderate pain or cramping. 01/16/16   Doug SouSam Jacubowitz, MD  metoCLOPramide (REGLAN) 10 MG tablet Take 1 tablet (10 mg total) by mouth every 6 (six) hours as needed for nausea or vomiting (nausea/headache). 01/16/16   Doug SouSam Jacubowitz, MD  naproxen (NAPROSYN) 500 MG tablet Take 500 mg by mouth 3 (three) times daily as needed (ovarian cyst pain.).    Historical Provider, MD  nicotine (NICODERM CQ - DOSED IN MG/24 HR) 7 mg/24hr patch Place 7 mg onto the skin daily.    Historical Provider, MD  tolnaftate (TINACTIN) 1 % powder Apply 1 application topically 2 (two) times daily. 08/09/15   Etta Grandchildhomas L Jones, MD    Allergies Review of patient's allergies indicates no  known allergies.   REVIEW OF SYSTEMS  Negative except as noted here or in the History of Present Illness.   PHYSICAL EXAMINATION  Initial Vital Signs Blood pressure 120/94, pulse 88, temperature 98.6 F (37 C), temperature source Oral, resp. rate 20, height 5\' 2"  (1.575 m), weight 255 lb (115.7 kg), last menstrual period 01/13/2016, SpO2 98 %.  Examination General: Well-developed, obese female in no acute distress; appearance consistent with age of record HENT: normocephalic; atraumatic Eyes: pupils equal, round and reactive to light; extraocular muscles intact Neck: supple Heart: regular rate and rhythm Lungs: clear to auscultation  bilaterally Abdomen: soft; obese; right upper quadrant tenderness; bowel sounds present Extremities: No deformity; full range of motion; pulses normal Neurologic: Awake, alert and oriented; motor function intact in all extremities and symmetric; no facial droop Skin: Warm and dry Psychiatric: Normal mood and affect   RESULTS  Summary of this visit's results, reviewed by myself:   EKG Interpretation  Date/Time:    Ventricular Rate:    PR Interval:    QRS Duration:   QT Interval:    QTC Calculation:   R Axis:     Text Interpretation:        Laboratory Studies: No results found for this or any previous visit (from the past 24 hour(s)). Imaging Studies: No results found.  ED COURSE  Nursing notes and initial vitals signs, including pulse oximetry, reviewed.  4:28 AM Patient now pain-free with no abdominal tenderness.   PROCEDURES    ED DIAGNOSES     ICD-9-CM ICD-10-CM   1. Biliary colic 574.20 K80.50        Paula Libra, MD 02/07/16 [phone]

## 2016-02-07 NOTE — ED Triage Notes (Signed)
Pt c/o RUQ and epigastric pain and burning that started about 10 minutes prior to coming in with nausea

## 2016-05-03 ENCOUNTER — Inpatient Hospital Stay (HOSPITAL_COMMUNITY): Admission: AD | Admit: 2016-05-03 | Payer: Self-pay | Admitting: Obstetrics and Gynecology

## 2016-05-03 ENCOUNTER — Encounter: Payer: Self-pay | Admitting: General Practice

## 2016-05-03 ENCOUNTER — Ambulatory Visit (INDEPENDENT_AMBULATORY_CARE_PROVIDER_SITE_OTHER): Payer: Medicaid Other | Admitting: General Practice

## 2016-05-03 DIAGNOSIS — Z3201 Encounter for pregnancy test, result positive: Secondary | ICD-10-CM

## 2016-05-03 LAB — POCT PREGNANCY, URINE: Preg Test, Ur: POSITIVE — AB

## 2016-05-03 NOTE — Progress Notes (Signed)
Patient here for upt today. UPT +. Patient reports first positive home test yesterday. LMP 04/01/16 EDD 01/06/17 [id number] today. Patient denies meds but has started PNV. Patient reports gallstones & history of rupture @ 18 weeks with subsequent delivery at 21 weeks with fetal loss. Provided information on baby scripts, gave pregnancy letter & told patient to schedule new OB appt with front office in 6 weeks. Patient verbalized understanding & had no questions

## 2016-05-28 NOTE — L&D Delivery Note (Signed)
Delivery Note  Patient is a 31 y/o G2P0  who has a di/di twin gestation at 21 wks and 1 day who presented in labor after PPROM of twin A on 3/13.   Called to room and patient was feeling pressure. With 2 pushing efforts twin A delivered. Delivered hand followed by head and body.   Cord cut and clamped.   Speculum placed and cervix visualized. Endo loop used to ligated cord which was then cut.  Infant for comfort care only in room with mother.  Plan to transfer to antepartum unit for latency antibiotics if no contractions and pain improves. Repeat CBC in AM.  Ernestina Penna, MD 08/27/16 [phone]

## 2016-05-28 NOTE — L&D Delivery Note (Signed)
Contacted [facility] @ [phone] and spoke with [facility]. Traci Peterson is not a candidate for donation due to his age and can be released to family choice [employer]. Referral number [phone]

## 2016-05-30 ENCOUNTER — Inpatient Hospital Stay (HOSPITAL_COMMUNITY): Payer: Medicaid Other

## 2016-05-30 ENCOUNTER — Inpatient Hospital Stay (HOSPITAL_COMMUNITY)
Admission: AD | Admit: 2016-05-30 | Discharge: 2016-05-30 | Disposition: A | Discharge status: Home / Self Care | Payer: Medicaid Other | Source: Ambulatory Visit | Attending: Family Medicine | Admitting: Family Medicine

## 2016-05-30 ENCOUNTER — Encounter (HOSPITAL_COMMUNITY): Payer: Self-pay | Admitting: *Deleted

## 2016-05-30 DIAGNOSIS — O208 Other hemorrhage in early pregnancy: Secondary | ICD-10-CM | POA: Diagnosis not present

## 2016-05-30 DIAGNOSIS — N760 Acute vaginitis: Secondary | ICD-10-CM

## 2016-05-30 DIAGNOSIS — M542 Cervicalgia: Secondary | ICD-10-CM | POA: Diagnosis not present

## 2016-05-30 DIAGNOSIS — Z9889 Other specified postprocedural states: Secondary | ICD-10-CM | POA: Insufficient documentation

## 2016-05-30 DIAGNOSIS — Z87891 Personal history of nicotine dependence: Secondary | ICD-10-CM | POA: Insufficient documentation

## 2016-05-30 DIAGNOSIS — O30001 Twin pregnancy, unspecified number of placenta and unspecified number of amniotic sacs, first trimester: Secondary | ICD-10-CM | POA: Diagnosis not present

## 2016-05-30 DIAGNOSIS — B9689 Other specified bacterial agents as the cause of diseases classified elsewhere: Secondary | ICD-10-CM | POA: Insufficient documentation

## 2016-05-30 DIAGNOSIS — Z3A08 8 weeks gestation of pregnancy: Secondary | ICD-10-CM | POA: Insufficient documentation

## 2016-05-30 DIAGNOSIS — O99341 Other mental disorders complicating pregnancy, first trimester: Secondary | ICD-10-CM | POA: Diagnosis not present

## 2016-05-30 DIAGNOSIS — O26891 Other specified pregnancy related conditions, first trimester: Secondary | ICD-10-CM

## 2016-05-30 DIAGNOSIS — O9989 Other specified diseases and conditions complicating pregnancy, childbirth and the puerperium: Secondary | ICD-10-CM

## 2016-05-30 DIAGNOSIS — Z79899 Other long term (current) drug therapy: Secondary | ICD-10-CM | POA: Insufficient documentation

## 2016-05-30 DIAGNOSIS — F419 Anxiety disorder, unspecified: Secondary | ICD-10-CM | POA: Insufficient documentation

## 2016-05-30 DIAGNOSIS — O3481 Maternal care for other abnormalities of pelvic organs, first trimester: Secondary | ICD-10-CM | POA: Insufficient documentation

## 2016-05-30 DIAGNOSIS — O23591 Infection of other part of genital tract in pregnancy, first trimester: Secondary | ICD-10-CM | POA: Insufficient documentation

## 2016-05-30 DIAGNOSIS — R102 Pelvic and perineal pain: Secondary | ICD-10-CM

## 2016-05-30 HISTORY — DX: Calculus of gallbladder without cholecystitis without obstruction: K80.20

## 2016-05-30 LAB — URINALYSIS, ROUTINE W REFLEX MICROSCOPIC
Bilirubin Urine: NEGATIVE
GLUCOSE, UA: 50 mg/dL — AB
HGB URINE DIPSTICK: NEGATIVE
Ketones, ur: 5 mg/dL — AB
NITRITE: NEGATIVE
Protein, ur: NEGATIVE mg/dL
SPECIFIC GRAVITY, URINE: 1.024 (ref 1.005–1.030)
pH: 6 (ref 5.0–8.0)

## 2016-05-30 LAB — CBC
HEMATOCRIT: 39.6 % (ref 36.0–46.0)
HEMOGLOBIN: 13.7 g/dL (ref 12.0–15.0)
MCH: 30.3 pg (ref 26.0–34.0)
MCHC: 34.6 g/dL (ref 30.0–36.0)
MCV: 87.6 fL (ref 78.0–100.0)
Platelets: 235 10*3/uL (ref 150–400)
RBC: 4.52 MIL/uL (ref 3.87–5.11)
RDW: 15.3 % (ref 11.5–15.5)
WBC: 11 10*3/uL — ABNORMAL HIGH (ref 4.0–10.5)

## 2016-05-30 LAB — WET PREP, GENITAL
SPERM: NONE SEEN
TRICH WET PREP: NONE SEEN
Yeast Wet Prep HPF POC: NONE SEEN

## 2016-05-30 LAB — HCG, QUANTITATIVE, PREGNANCY: hCG, Beta Chain, Quant, S: 79338 m[IU]/mL — ABNORMAL HIGH (ref <5)

## 2016-05-30 MED ORDER — METRONIDAZOLE 500 MG PO TABS: 500.0000 mg | ORAL_TABLET | Freq: Two times a day (BID) | ORAL | 0 refills | Status: DC

## 2016-05-30 NOTE — MAU Note (Signed)
Pt presents to MAU with "cervical pain" reports that when she does "kegel like exercises" she feels sharp pains. Reports also feeling the pain when she is walking. Pain began this morning. Denies any abnormal discharge, urinary symptoms, bleeding or leaking of fluid.

## 2016-05-30 NOTE — Discharge Instructions (Signed)
Multiple Pregnancy Having a multiple pregnancy means that a woman is carrying more than one baby at a time. She may be pregnant with twins, triplets, or more. The majority of multiple pregnancies are twins. Naturally conceiving triplets or more (higher-order multiples) is rare. Multiple pregnancies are riskier than single pregnancies. A woman with a multiple pregnancy is more likely to have certain problems during her pregnancy. Therefore, she will need to have more frequent appointments for prenatal care. How does a multiple pregnancy happen? A multiple pregnancy happens when:  The woman's body releases more than one egg at a time, and then each egg gets fertilized by a different sperm.  This is the most common type of multiple pregnancy.  Twins or other multiples produced this way are fraternal. They are no more alike than non-multiple siblings are.  One sperm fertilizes one egg, which then divides into more than one embryo.  Twins or other multiples produced this way are identical. Identical multiples are always the same gender, and they look very much alike. Who is most likely to have a multiple pregnancy? A multiple pregnancy is more likely to develop in women who:  Have had fertility treatment, especially if the treatment included fertility drugs.  Are older than 29 years of age.  Have already had four or more children.  Have a family history of multiple pregnancy. How is a multiple pregnancy diagnosed? A multiple pregnancy may be diagnosed based on:  Symptoms such as:  Rapid weight gain in the first 3 months of pregnancy (first trimester).  More severe nausea and breast tenderness than what is typical of a single pregnancy.  The uterus measuring larger than what is normal for the stage of the pregnancy.  Blood tests that detect a higher-than-normal level of human chorionic gonadotropin (hCG). This is a hormone that your body produces in early pregnancy.  Ultrasound exam.  This is used to confirm that you are carrying multiples. What risks are associated with multiple pregnancy? A multiple pregnancy puts you at a higher risk for certain problems during or after your pregnancy, including:  Having your babies delivered before you have reached a full-term pregnancy (preterm birth). A full-term pregnancy lasts for at least 37 weeks. Babies born before 40 weeks may have a higher risk of a variety of health problems, such as breathing problems, feeding difficulties, cerebral palsy, and learning disabilities.  Diabetes.  Preeclampsia. This is a serious condition that causes high blood pressure along with other symptoms, such as swelling and headaches, during pregnancy.  Excessive blood loss after childbirth (postpartum hemorrhage).  Postpartum depression.  Low birth weight of the babies. How will having a multiple pregnancy affect my care? Your health care provider will want to monitor you more closely during your pregnancy to make sure that your babies are growing normally and that you are healthy. Follow these instructions at home: Because your pregnancy is considered to be high risk, you will need to work closely with your health care team. You may also need to make some lifestyle changes. These may include the following: Eating and drinking  Increase your nutrition.  Follow your health care providers recommendations for weight gain. You may need to gain a little extra weight when you are pregnant with multiples.  Eat healthy snacks often throughout the day. This can add calories and reduce nausea.  Drink enough fluid to keep your urine clear or pale yellow.  Take prenatal vitamins. Activity By 20-24 weeks, you may need to limit your  activities.  Avoid activities and work that take a lot of effort (are strenuous).  Ask your health care provider when you should stop having sexual intercourse.  Rest often. General instructions  Do not use any  products that contain nicotine or tobacco, such as cigarettes and e-cigarettes. If you need help quitting, ask your health care provider.  Do not drink alcohol or use illegal drugs.  Take over-the-counter and prescription medicines only as told by your health care provider.  Arrange for extra help around the house.  Keep all follow-up visits and all prenatal visits as told by your health care provider. This is important. Contact a health care provider if:  You have dizziness.  You have persistent nausea, vomiting, or diarrhea.  You are having trouble gaining weight.  You have feelings of depression or other emotions that are interfering with your normal activities. Get help right away if:  You have a fever.  You have pain with urination.  You have fluid leaking from your vagina.  You have a bad-smelling vaginal discharge.  You notice increased swelling in your face, hands, legs, or ankles.  You have spotting or bleeding from your vagina.  You have pelvic cramps, pelvic pressure, or nagging pain in your abdomen or lower back.  You are having regular contractions.  You develop a severe headache, with or without visual changes.  You have shortness of breath or chest pain.  You notice less fetal movement, or no fetal movement. This information is not intended to replace advice given to you by your health care provider. Make sure you discuss any questions you have with your health care provider. Document Released: 02/21/2008 Document Revised: 01/13/2016 Document Reviewed: 01/13/2016 Elsevier Interactive Patient Education  2017 [employer]. First Trimester of Pregnancy The first trimester of pregnancy is from week 1 until the end of week 12 (months 1 through 3). A week after a sperm fertilizes an egg, the egg will implant on the wall of the uterus. This embryo will begin to develop into a baby. Genes from you and your partner are forming the baby. The female genes determine  whether the baby is a boy or a girl. At 6-8 weeks, the eyes and face are formed, and the heartbeat can be seen on ultrasound. At the end of 12 weeks, all the baby's organs are formed.  Now that you are pregnant, you will want to do everything you can to have a healthy baby. Two of the most important things are to get good prenatal care and to follow your health care provider's instructions. Prenatal care is all the medical care you receive before the baby's birth. This care will help prevent, find, and treat any problems during the pregnancy and childbirth. BODY CHANGES Your body goes through many changes during pregnancy. The changes vary from woman to woman.   You may gain or lose a couple of pounds at first.  You may feel sick to your stomach (nauseous) and throw up (vomit). If the vomiting is uncontrollable, call your health care provider.  You may tire easily.  You may develop headaches that can be relieved by medicines approved by your health care provider.  You may urinate more often. Painful urination may mean you have a bladder infection.  You may develop heartburn as a result of your pregnancy.  You may develop constipation because certain hormones are causing the muscles that push waste through your intestines to slow down.  You may develop hemorrhoids or swollen, bulging  veins (varicose veins).  Your breasts may begin to grow larger and become tender. Your nipples may stick out more, and the tissue that surrounds them (areola) may become darker.  Your gums may bleed and may be sensitive to brushing and flossing.  Dark spots or blotches (chloasma, mask of pregnancy) may develop on your face. This will likely fade after the baby is born.  Your menstrual periods will stop.  You may have a loss of appetite.  You may develop cravings for certain kinds of food.  You may have changes in your emotions from day to day, such as being excited to be pregnant or being concerned that  something may go wrong with the pregnancy and baby.  You may have more vivid and strange dreams.  You may have changes in your hair. These can include thickening of your hair, rapid growth, and changes in texture. Some women also have hair loss during or after pregnancy, or hair that feels dry or thin. Your hair will most likely return to normal after your baby is born. WHAT TO EXPECT AT YOUR PRENATAL VISITS During a routine prenatal visit:  You will be weighed to make sure you and the baby are growing normally.  Your blood pressure will be taken.  Your abdomen will be measured to track your baby's growth.  The fetal heartbeat will be listened to starting around week 10 or 12 of your pregnancy.  Test results from any previous visits will be discussed. Your health care provider may ask you:  How you are feeling.  If you are feeling the baby move.  If you have had any abnormal symptoms, such as leaking fluid, bleeding, severe headaches, or abdominal cramping.  If you are using any tobacco products, including cigarettes, chewing tobacco, and electronic cigarettes.  If you have any questions. Other tests that may be performed during your first trimester include:  Blood tests to find your blood type and to check for the presence of any previous infections. They will also be used to check for low iron levels (anemia) and Rh antibodies. Later in the pregnancy, blood tests for diabetes will be done along with other tests if problems develop.  Urine tests to check for infections, diabetes, or protein in the urine.  An ultrasound to confirm the proper growth and development of the baby.  An amniocentesis to check for possible genetic problems.  Fetal screens for spina bifida and Down syndrome.  You may need other tests to make sure you and the baby are doing well.  HIV (human immunodeficiency virus) testing. Routine prenatal testing includes screening for HIV, unless you choose not to  have this test. HOME CARE INSTRUCTIONS  Medicines   Follow your health care provider's instructions regarding medicine use. Specific medicines may be either safe or unsafe to take during pregnancy.  Take your prenatal vitamins as directed.  If you develop constipation, try taking a stool softener if your health care provider approves. Diet   Eat regular, well-balanced meals. Choose a variety of foods, such as meat or vegetable-based protein, fish, milk and low-fat dairy products, vegetables, fruits, and whole grain breads and cereals. Your health care provider will help you determine the amount of weight gain that is right for you.  Avoid raw meat and uncooked cheese. These carry germs that can cause birth defects in the baby.  Eating four or five small meals rather than three large meals a day may help relieve nausea and vomiting. If you start  to feel nauseous, eating a few soda crackers can be helpful. Drinking liquids between meals instead of during meals also seems to help nausea and vomiting.  If you develop constipation, eat more high-fiber foods, such as fresh vegetables or fruit and whole grains. Drink enough fluids to keep your urine clear or pale yellow. Activity and Exercise   Exercise only as directed by your health care provider. Exercising will help you:  Control your weight.  Stay in shape.  Be prepared for labor and delivery.  Experiencing pain or cramping in the lower abdomen or low back is a good sign that you should stop exercising. Check with your health care provider before continuing normal exercises.  Try to avoid standing for long periods of time. Move your legs often if you must stand in one place for a long time.  Avoid heavy lifting.  Wear low-heeled shoes, and practice good posture.  You may continue to have sex unless your health care provider directs you otherwise. Relief of Pain or Discomfort   Wear a good support bra for breast tenderness.    Take warm sitz baths to soothe any pain or discomfort caused by hemorrhoids. Use hemorrhoid cream if your health care provider approves.   Rest with your legs elevated if you have leg cramps or low back pain.  If you develop varicose veins in your legs, wear support hose. Elevate your feet for 15 minutes, 3-4 times a day. Limit salt in your diet. Prenatal Care   Schedule your prenatal visits by the twelfth week of pregnancy. They are usually scheduled monthly at first, then more often in the last 2 months before delivery.  Write down your questions. Take them to your prenatal visits.  Keep all your prenatal visits as directed by your health care provider. Safety   Wear your seat belt at all times when driving.  Make a list of emergency phone numbers, including numbers for family, friends, the hospital, and police and fire departments. General Tips   Ask your health care provider for a referral to a local prenatal education class. Begin classes no later than at the beginning of month 6 of your pregnancy.  Ask for help if you have counseling or nutritional needs during pregnancy. Your health care provider can offer advice or refer you to specialists for help with various needs.  Do not use hot tubs, steam rooms, or saunas.  Do not douche or use tampons or scented sanitary pads.  Do not cross your legs for long periods of time.  Avoid cat litter boxes and soil used by cats. These carry germs that can cause birth defects in the baby and possibly loss of the fetus by miscarriage or stillbirth.  Avoid all smoking, herbs, alcohol, and medicines not prescribed by your health care provider. Chemicals in these affect the formation and growth of the baby.  Do not use any tobacco products, including cigarettes, chewing tobacco, and electronic cigarettes. If you need help quitting, ask your health care provider. You may receive counseling support and other resources to help you  quit.  Schedule a dentist appointment. At home, brush your teeth with a soft toothbrush and be gentle when you floss. SEEK MEDICAL CARE IF:   You have dizziness.  You have mild pelvic cramps, pelvic pressure, or nagging pain in the abdominal area.  You have persistent nausea, vomiting, or diarrhea.  You have a bad smelling vaginal discharge.  You have pain with urination.  You notice increased swelling  in your face, hands, legs, or ankles. SEEK IMMEDIATE MEDICAL CARE IF:   You have a fever.  You are leaking fluid from your vagina.  You have spotting or bleeding from your vagina.  You have severe abdominal cramping or pain.  You have rapid weight gain or loss.  You vomit blood or material that looks like coffee grounds.  You are exposed to [place] measles and have never had them.  You are exposed to fifth disease or chickenpox.  You develop a severe headache.  You have shortness of breath.  You have any kind of trauma, such as from a fall or a car accident. This information is not intended to replace advice given to you by your health care provider. Make sure you discuss any questions you have with your health care provider. Document Released: 05/08/2001 Document Revised: 06/04/2014 Document Reviewed: 03/24/2013 Elsevier Interactive Patient Education  2017 [employer]

## 2016-05-30 NOTE — MAU Provider Note (Signed)
Chief Complaint: Abdominal Pain   Seen by provider at  1335 hrs    SUBJECTIVE HPI: Traci Peterson is a 31 y.o. G2P0100 at 4938w3d by LMP who presents to maternity admissions reporting pain in her lower abdomen/vaginal area when urinating.  Feels it also when she walks. . She denies vaginal bleeding, vaginal itching/burning, urinary symptoms, h/a, dizziness, n/v, or fever/chills.   Abdominal Pain  This is a new problem. The current episode started in the past 7 days. The onset quality is gradual. The problem occurs intermittently. The problem has been waxing and waning. The pain is located in the suprapubic region. The pain is mild. The quality of the pain is cramping. The abdominal pain does not radiate. Pertinent negatives include no constipation, diarrhea, dysuria, fever, headaches, myalgias, nausea or vomiting. The pain is aggravated by certain positions and movement. The pain is relieved by nothing. She has tried nothing for the symptoms.    RN Note: Pt presents to MAU with "cervical pain" reports that when she does "kegel like exercises" she feels sharp pains. Reports also feeling the pain when she is walking. Pain began this morning. Denies any abnormal discharge, urinary symptoms, bleeding or leaking of fluid.   Past Medical History:  Diagnosis Date  . Anxiety    no current med.  . Chronic headaches   . Gall stones   . Jaw snapping    states jaw pops if opens mouth too wide  . Menstrual bleeding problem    menses from 12/22/2012 - current (03/06/2013)  . Ovarian cyst   . Pilonidal cyst 02/2013   Past Surgical History:  Procedure Laterality Date  . PILONIDAL CYST EXCISION N/A 03/12/2013   Procedure: CYST EXCISION PILONIDAL EXTENSIVE;  Surgeon: Shelly Rubensteinouglas A Blackman, MD;  Location: [facility];  Service: General;  Laterality: N/A;  . WISDOM TOOTH EXTRACTION     Social History   Social History  . Marital status: Married    Spouse name: N/A  . Number of children:  N/A  . Years of education: N/A   Occupational History  .  Other    CNA   Social History Main Topics  . Smoking status: Former Smoker    Packs/day: 0.50    Years: 6.00    Types: Cigarettes    Quit date: 06/27/2013  . Smokeless tobacco: Never Used     Comment: 3-5 cig./day  . Alcohol use No     Comment: socially  . Drug use: No  . Sexual activity: Yes   Other Topics Concern  . Not on file   Social History Narrative  . No narrative on file   No current facility-administered medications on file prior to encounter.    Current Outpatient Prescriptions on File Prior to Encounter  Medication Sig Dispense Refill  . HYDROcodone-acetaminophen (NORCO/VICODIN) 5-325 MG tablet Take 1-2 tablets by mouth every 6 (six) hours as needed (for pain). (Patient not taking: Reported on 05/30/2016) 20 tablet 0  . ibuprofen (ADVIL,MOTRIN) 600 MG tablet Take 1 tablet (600 mg total) by mouth every 6 (six) hours as needed for moderate pain or cramping. (Patient not taking: Reported on 05/30/2016) 28 tablet 0  . metoCLOPramide (REGLAN) 10 MG tablet Take 1 tablet (10 mg total) by mouth every 6 (six) hours as needed for nausea or vomiting (nausea/headache). (Patient not taking: Reported on 05/30/2016) 15 tablet 0   No Known Allergies  I have reviewed patient's Past Medical Hx, Surgical Hx, Family Hx, Social Hx, medications and  allergies.   ROS:  Review of Systems  Constitutional: Negative for fever.  Gastrointestinal: Positive for abdominal pain. Negative for constipation, diarrhea, nausea and vomiting.  Genitourinary: Negative for dysuria.  Musculoskeletal: Negative for myalgias.  Neurological: Negative for headaches.   Review of Systems  Other systems negative   Physical Exam  Physical Exam Patient Vitals for the past 24 hrs:  BP Temp Temp src Pulse Resp  05/30/16 1317 123/70 99.2 F (37.3 C) Oral 103 18   Constitutional: Well-developed, well-nourished female in no acute distress.   Cardiovascular: normal rate Respiratory: normal effort GI: Abd soft, non-tender. Pos BS x 4 MS: Extremities nontender, no edema, normal ROM Neurologic: Alert and oriented x 4.  GU: Neg CVAT.  PELVIC EXAM: Cervix pink, visually closed, without lesion, scant white creamy discharge, vaginal walls and external genitalia normal Bimanual exam: Cervix 0/long/high, firm, anterior, neg CMT, uterus nontender, not easily paplated due to habitus, adnexa without tenderness, enlargement, or mass   LAB RESULTS Results for orders placed or performed during the hospital encounter of 05/30/16 (from the past 24 hour(s))  Wet prep, genital     Status: Abnormal   Collection Time: 05/30/16  1:28 PM  Result Value Ref Range   Yeast Wet Prep HPF POC NONE SEEN NONE SEEN   Trich, Wet Prep NONE SEEN NONE SEEN   Clue Cells Wet Prep HPF POC PRESENT (A) NONE SEEN   WBC, Wet Prep HPF POC FEW (A) NONE SEEN   Sperm NONE SEEN   Urinalysis, Routine w reflex microscopic     Status: Abnormal   Collection Time: 05/30/16  1:29 PM  Result Value Ref Range   Color, Urine YELLOW YELLOW   APPearance HAZY (A) CLEAR   Specific Gravity, Urine 1.024 1.005 - 1.030   pH 6.0 5.0 - 8.0   Glucose, UA 50 (A) NEGATIVE mg/dL   Hgb urine dipstick NEGATIVE NEGATIVE   Bilirubin Urine NEGATIVE NEGATIVE   Ketones, ur 5 (A) NEGATIVE mg/dL   Protein, ur NEGATIVE NEGATIVE mg/dL   Nitrite NEGATIVE NEGATIVE   Leukocytes, UA MODERATE (A) NEGATIVE   RBC / HPF 0-5 0 - 5 RBC/hpf   WBC, UA 6-30 0 - 5 WBC/hpf   Bacteria, UA RARE (A) NONE SEEN   Squamous Epithelial / LPF 6-30 (A) NONE SEEN   Mucous PRESENT   CBC     Status: Abnormal   Collection Time: 05/30/16  1:34 PM  Result Value Ref Range   WBC 11.0 (H) 4.0 - 10.5 K/uL   RBC 4.52 3.87 - 5.11 MIL/uL   Hemoglobin 13.7 12.0 - 15.0 g/dL   HCT 16.1 09.6 - 04.5 %   MCV 87.6 78.0 - 100.0 fL   MCH 30.3 26.0 - 34.0 pg   MCHC 34.6 30.0 - 36.0 g/dL   RDW 40.9 81.1 - 91.4 %   Platelets 235 150  - 400 K/uL  hCG, quantitative, pregnancy     Status: Abnormal   Collection Time: 05/30/16  1:34 PM  Result Value Ref Range   hCG, Beta Chain, Quant, S 79,338 (H) <5 mIU/mL       IMAGING US Ob Comp Less 14 Wks  Result Date: 05/30/2016 CLINICAL DATA:  Pelvic pain affecting early pregnancy EXAM: TWIN OBSTETRIC <14WK [place] AND TRANSVAGINAL OB US COMPARISON:  None. FINDINGS: Number of IUPs:  2 Chorionicity/Amnionicity:  Dichorionic diamniotic TWIN 1 Yolk sac:  Visualized Embryo:  Visualized Cardiac Activity: Visualized Heart Rate: 166 bpm MSD:   mm  w     d CRL:  16.1  mm   8 w 0 d                  [place] EDC: 01/09/2017 TWIN 2 Yolk sac:  Visualized Embryo:  Visualized Cardiac Activity: Visualized Heart Rate: 171 bpm MSD:   mm    w     d CRL:  16.0  mm   8 w 0 d                  [place] EDC: 01/09/2017 Subchorionic hemorrhage: Moderate subchorionic hemorrhage measuring 3.2 x 2.8 x 2.0 cm. Maternal uterus/adnexae: There is a benign-appearing cyst in the right ovary measuring 5.6 x 5.5 x 4.9 cm. No free fluid IMPRESSION: Twin pregnancy, estimated gestational age [redacted] weeks 0 day. Moderate-sized subchorionic hemorrhage. Benign-appearing 5.6 cm right ovarian cyst. Electronically Signed   By: Charlett Nose M.D.   On: 05/30/2016 15:15   US Ob Transvaginal  Result Date: 05/30/2016 CLINICAL DATA:  Pelvic pain affecting early pregnancy EXAM: TWIN OBSTETRIC <14WK [place] AND TRANSVAGINAL OB US COMPARISON:  None. FINDINGS: Number of IUPs:  2 Chorionicity/Amnionicity:  Dichorionic diamniotic TWIN 1 Yolk sac:  Visualized Embryo:  Visualized Cardiac Activity: Visualized Heart Rate: 166 bpm MSD:   mm    w     d CRL:  16.1  mm   8 w 0 d                  [place] EDC: 01/09/2017 TWIN 2 Yolk sac:  Visualized Embryo:  Visualized Cardiac Activity: Visualized Heart Rate: 171 bpm MSD:   mm    w     d CRL:  16.0  mm   8 w 0 d                  [place] EDC: 01/09/2017 Subchorionic hemorrhage: Moderate subchorionic hemorrhage measuring 3.2 x 2.8 x 2.0 cm.  Maternal uterus/adnexae: There is a benign-appearing cyst in the right ovary measuring 5.6 x 5.5 x 4.9 cm. No free fluid IMPRESSION: Twin pregnancy, estimated gestational age [redacted] weeks 0 day. Moderate-sized subchorionic hemorrhage. Benign-appearing 5.6 cm right ovarian cyst. Electronically Signed   By: Charlett Nose M.D.   On: 05/30/2016 15:15    MAU Management/MDM: Ordered usual first trimester r/o ectopic labs.   Pelvic exam and cultures done Will check baseline Ultrasound to rule out ectopic.  This bleeding/pain can represent a normal pregnancy with bleeding, spontaneous abortion or even an ectopic which can be life-threatening.  The process as listed above helps to determine which of these is present.  Reviewed results with patient who is ecstatic about twins. States she felt that God would restore in double what she had lost (with loss of 21 weeker before).  Wants to start prenatal care soon.   ASSESSMENT 1. Pelvic pain affecting pregnancy in first trimester, antepartum   2. Pelvic pain affecting pregnancy in first trimester, antepartum   3.    Twin gestation at [redacted]w[redacted]d 4.     Bacterial vaginosis 5.     Moderate subchorionic hemorrhage  PLAN Discharge home message sent to [facility] for new ob appt Rx Flagyl for BV  Pt stable at time of discharge. Encouraged to return here or to other Urgent Care/ED if she develops worsening of symptoms, increase in pain, fever, or other concerning symptoms.    Wynelle Bourgeois CNM, MSN Certified Nurse-Midwife 05/30/2016  3:21 PM

## 2016-05-31 LAB — HIV ANTIBODY (ROUTINE TESTING W REFLEX): HIV Screen 4th Generation wRfx: NONREACTIVE

## 2016-05-31 LAB — GC/CHLAMYDIA PROBE AMP (~~LOC~~) NOT AT ARMC
CHLAMYDIA, DNA PROBE: NEGATIVE
Neisseria Gonorrhea: NEGATIVE

## 2016-06-06 ENCOUNTER — Encounter: Payer: Self-pay | Admitting: Obstetrics & Gynecology

## 2016-06-06 ENCOUNTER — Encounter: Payer: Self-pay | Admitting: *Deleted

## 2016-06-06 ENCOUNTER — Other Ambulatory Visit (HOSPITAL_COMMUNITY)
Admission: RE | Admit: 2016-06-06 | Discharge: 2016-06-06 | Disposition: A | Discharge status: Home / Self Care | Payer: Medicaid Other | Source: Ambulatory Visit | Attending: Obstetrics & Gynecology | Admitting: Obstetrics & Gynecology

## 2016-06-06 ENCOUNTER — Other Ambulatory Visit: Payer: Self-pay | Admitting: Obstetrics & Gynecology

## 2016-06-06 ENCOUNTER — Ambulatory Visit (INDEPENDENT_AMBULATORY_CARE_PROVIDER_SITE_OTHER): Payer: Medicaid Other | Admitting: Obstetrics & Gynecology

## 2016-06-06 VITALS — BP 112/69 | HR 91 | Wt 271.0 lb

## 2016-06-06 DIAGNOSIS — Z803 Family history of malignant neoplasm of breast: Secondary | ICD-10-CM | POA: Insufficient documentation

## 2016-06-06 DIAGNOSIS — Z3682 Encounter for antenatal screening for nuchal translucency: Secondary | ICD-10-CM

## 2016-06-06 DIAGNOSIS — O30009 Twin pregnancy, unspecified number of placenta and unspecified number of amniotic sacs, unspecified trimester: Secondary | ICD-10-CM

## 2016-06-06 DIAGNOSIS — Z01419 Encounter for gynecological examination (general) (routine) without abnormal findings: Secondary | ICD-10-CM | POA: Diagnosis present

## 2016-06-06 DIAGNOSIS — Z113 Encounter for screening for infections with a predominantly sexual mode of transmission: Secondary | ICD-10-CM | POA: Insufficient documentation

## 2016-06-06 DIAGNOSIS — Z3A12 12 weeks gestation of pregnancy: Secondary | ICD-10-CM

## 2016-06-06 DIAGNOSIS — Z3A09 9 weeks gestation of pregnancy: Secondary | ICD-10-CM

## 2016-06-06 DIAGNOSIS — Z3689 Encounter for other specified antenatal screening: Secondary | ICD-10-CM | POA: Diagnosis not present

## 2016-06-06 DIAGNOSIS — O30001 Twin pregnancy, unspecified number of placenta and unspecified number of amniotic sacs, first trimester: Secondary | ICD-10-CM | POA: Diagnosis not present

## 2016-06-06 DIAGNOSIS — Z1151 Encounter for screening for human papillomavirus (HPV): Secondary | ICD-10-CM | POA: Insufficient documentation

## 2016-06-06 LAB — GLUCOSE, RANDOM: Glucose, Bld: 103 mg/dL — ABNORMAL HIGH (ref 65–99)

## 2016-06-06 LAB — TSH: TSH: 2.04 m[IU]/L

## 2016-06-06 MED ORDER — DOCUSATE SODIUM 100 MG PO CAPS: 100.0000 mg | ORAL_CAPSULE | Freq: Two times a day (BID) | ORAL | 2 refills | Status: DC | PRN

## 2016-06-06 MED ORDER — POLYETHYLENE GLYCOL 3350 17 GM/SCOOP PO POWD: 17.0000 g | Freq: Every day | ORAL | 0 refills | Status: DC

## 2016-06-06 MED ORDER — FOLIC ACID 800 MCG PO TABS: 800.0000 ug | ORAL_TABLET | Freq: Every day | ORAL | 1 refills | Status: DC

## 2016-06-06 NOTE — Progress Notes (Signed)
Subjective:    Traci Peterson is a G2P0100 [redacted]w[redacted]d being seen today for her first obstetrical visit.  Her obstetrical history is significant for obesity and history of 18 week PPROM and 21 week delivery (does not appear to be cervical incompetence by antpartum notes). Pregnancy history fully reviewed.  Pt has constipation and stopped taking PNV.  Pt has appropriate breast tenderness.  Patient reports constipation.  Vitals:   06/06/16 0840  BP: 112/69  Pulse: 91  Weight: 271 lb (122.9 kg)    HISTORY: OB History  Gravida Para Term Preterm AB Living  2 1   1    0  SAB TAB Ectopic Multiple Live Births        0      # Outcome Date GA Lbr Len/2nd Weight Sex Delivery Anes PTL Lv  2 Current           1 Preterm 11/12/14 [redacted]w[redacted]d  13.2 oz (0.374 kg) M Vag-Spont None  FD     Past Medical History:  Diagnosis Date  . Anxiety    no current med.  . Chronic headaches   . Gall stones   . HPV in female   . Jaw snapping    states jaw pops if opens mouth too wide  . Menstrual bleeding problem    menses from 12/22/2012 - current (03/06/2013)  . Ovarian cyst   . Pilonidal cyst 02/2013  . Vaginal Pap smear, abnormal    Past Surgical History:  Procedure Laterality Date  . PILONIDAL CYST EXCISION N/A 03/12/2013   Procedure: CYST EXCISION PILONIDAL EXTENSIVE;  Surgeon: Shelly Rubenstein, MD;  Location: [facility];  Service: General;  Laterality: N/A;  . WISDOM TOOTH EXTRACTION     Family History  Problem Relation Age of Onset  . Hypertension Mother   . Hyperlipidemia Mother   . Diabetes Mother   . Thyroid disease Mother   . Diabetes Father   . Hypertension Father   . Alcohol abuse Father   . Breast cancer Maternal Aunt   . Lung cancer Paternal Grandfather      Exam    Uterus:     Pelvic Exam:    Perineum: No Hemorrhoids   Vulva: Bartholin's, Urethra, Skene's normal   Vagina:  normal mucosa   pH: n/a   Cervix: no lesions and nulliparous appearance   Adnexa: no  mass, fullness, tenderness; limited by habitus   Bony Pelvis: average  System: Breast:  normal appearance, no masses or tenderness   Skin: normal coloration and turgor, no rashes    Neurologic: oriented, normal mood   Extremities: normal strength, tone, and muscle mass   HEENT sclera clear, anicteric, neck supple with midline trachea and thyroid without masses   Mouth/Teeth mucous membranes moist, pharynx normal without lesions and dental hygiene poor   Neck supple and no masses   Cardiovascular: regular rate and rhythm   Respiratory:  appears well, vitals normal, no respiratory distress, acyanotic, normal RR, chest clear, no wheezing, crepitations, rhonchi, normal symmetric air entry   Abdomen: soft, non-tender; bowel sounds normal; no masses,  no organomegaly   Urinary: urethral meatus normal      Assessment:    Pregnancy: G2P0100 Patient Active Problem List   Diagnosis Date Noted  . Family history of breast cancer in female 06/06/2016  . Twin pregnancy with problem affecting pregnancy, antepartum 06/06/2016  . Routine general medical examination at a health care facility 08/08/2015  . Tinea pedis, recurrent 08/08/2015  .  Pernicious anemia 08/08/2015  . Morbidly obese (HCC) 10/25/2014  . Eczema 10/25/2014  . 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 Chronic headaches -  no meds 10/25/2014  . Anxiety and depression 11/30/2013  . Tobacco abuse 05/25/2013  . Atypical squamous cells of undetermined significance with positive high risk human papillomavirus 05/08/2013  . PCOS (polycystic ovarian syndrome) 04/20/2013        Plan:     Initial labs drawn. Prenatal vitamins. Problem list reviewed and updated. Genetic Screening discussed First Screen: ordered.  Ultrasound discussed; fetal survey: will be done 18-20 weeks.  Follow up in 4 weeks.  1.  History of preterm delivery--17 OHP at 16  weeks.  2.  Obesity--early glucola; [facility] gain.  3.  Dental referral   4.  Start folic acid if can't tolerate PNV  5.  Constipation--colace and miralax  Elsie LincolnKelly Allysson Rinehimer 06/06/2016

## 2016-06-06 NOTE — Progress Notes (Signed)
Bedside U/S shows twin IUP with FHT of 180/182 BPM.  Baby A 22.712mm  GA 5136w6d  Baby B 21.598mm  GA 1436w6d

## 2016-06-07 LAB — PRENATAL PROFILE (SOLSTAS)
ANTIBODY SCREEN: NEGATIVE
BASOS PCT: 0 %
Basophils Absolute: 0 cells/uL (ref 0–200)
EOS ABS: 103 {cells}/uL (ref 15–500)
Eosinophils Relative: 1 %
HEMATOCRIT: 41.9 % (ref 35.0–45.0)
HIV: NONREACTIVE
Hemoglobin: 14 g/dL (ref 11.7–15.5)
Hepatitis B Surface Ag: NEGATIVE
Lymphocytes Relative: 23 %
Lymphs Abs: 2369 cells/uL (ref 850–3900)
MCH: 30 pg (ref 27.0–33.0)
MCHC: 33.4 g/dL (ref 32.0–36.0)
MCV: 89.7 fL (ref 80.0–100.0)
MONO ABS: 721 {cells}/uL (ref 200–950)
MPV: 10.8 fL (ref 7.5–12.5)
Monocytes Relative: 7 %
NEUTROS ABS: 7107 {cells}/uL (ref 1500–7800)
Neutrophils Relative %: 69 %
PLATELETS: 271 10*3/uL (ref 140–400)
RBC: 4.67 MIL/uL (ref 3.80–5.10)
RDW: 15.1 % — AB (ref 11.0–15.0)
Rh Type: POSITIVE
Rubella: 2.85 Index — ABNORMAL HIGH (ref <0.90)
WBC: 10.3 10*3/uL (ref 3.8–10.8)

## 2016-06-07 LAB — PAIN MGMT, PROFILE 6 CONF W/O MM, U
6 Acetylmorphine: NEGATIVE ng/mL (ref <10)
ALCOHOL METABOLITES: NEGATIVE ng/mL (ref <500)
Amphetamines: NEGATIVE ng/mL (ref <500)
BENZODIAZEPINES: NEGATIVE ng/mL (ref <100)
Barbiturates: NEGATIVE ng/mL (ref <300)
CREATININE: 181.6 mg/dL (ref >=20.0)
Cocaine Metabolite: NEGATIVE ng/mL (ref <150)
MARIJUANA METABOLITE: NEGATIVE ng/mL (ref <20)
METHADONE METABOLITE: NEGATIVE ng/mL (ref <100)
OPIATES: NEGATIVE ng/mL (ref <100)
OXIDANT: NEGATIVE ug/mL (ref <200)
Oxycodone: NEGATIVE ng/mL (ref <100)
PHENCYCLIDINE: NEGATIVE ng/mL (ref <25)
PLEASE NOTE: 0
pH: 7.83 (ref 4.5–9.0)

## 2016-06-07 LAB — SICKLE CELL SCREEN: SICKLE CELL SCREEN: NEGATIVE

## 2016-06-07 LAB — HEMOGLOBIN A1C
Hgb A1c MFr Bld: 5.8 % — ABNORMAL HIGH (ref <5.7)
MEAN PLASMA GLUCOSE: 120 mg/dL

## 2016-06-08 LAB — CYTOLOGY - PAP
BACTERIAL VAGINITIS: POSITIVE — AB
CHLAMYDIA, DNA PROBE: NEGATIVE
Candida vaginitis: NEGATIVE
Diagnosis: NEGATIVE
HPV: DETECTED — AB
NEISSERIA GONORRHEA: NEGATIVE
TRICH (WINDOWPATH): NEGATIVE

## 2016-06-08 LAB — CYSTIC FIBROSIS DIAGNOSTIC STUDY

## 2016-06-08 LAB — CULTURE, OB URINE

## 2016-06-10 ENCOUNTER — Emergency Department (HOSPITAL_BASED_OUTPATIENT_CLINIC_OR_DEPARTMENT_OTHER)
Admission: EM | Admit: 2016-06-10 | Discharge: 2016-06-10 | Disposition: A | Discharge status: Home / Self Care | Payer: Medicaid Other | Attending: Emergency Medicine | Admitting: Emergency Medicine

## 2016-06-10 ENCOUNTER — Encounter (HOSPITAL_BASED_OUTPATIENT_CLINIC_OR_DEPARTMENT_OTHER): Payer: Self-pay | Admitting: Emergency Medicine

## 2016-06-10 DIAGNOSIS — O26891 Other specified pregnancy related conditions, first trimester: Secondary | ICD-10-CM

## 2016-06-10 DIAGNOSIS — O99611 Diseases of the digestive system complicating pregnancy, first trimester: Secondary | ICD-10-CM | POA: Diagnosis not present

## 2016-06-10 DIAGNOSIS — R109 Unspecified abdominal pain: Secondary | ICD-10-CM

## 2016-06-10 DIAGNOSIS — K8050 Calculus of bile duct without cholangitis or cholecystitis without obstruction: Secondary | ICD-10-CM

## 2016-06-10 DIAGNOSIS — Z87891 Personal history of nicotine dependence: Secondary | ICD-10-CM | POA: Insufficient documentation

## 2016-06-10 DIAGNOSIS — Z3A10 10 weeks gestation of pregnancy: Secondary | ICD-10-CM | POA: Insufficient documentation

## 2016-06-10 DIAGNOSIS — Z79899 Other long term (current) drug therapy: Secondary | ICD-10-CM | POA: Diagnosis not present

## 2016-06-10 LAB — URINALYSIS, ROUTINE W REFLEX MICROSCOPIC
Bilirubin Urine: NEGATIVE
Glucose, UA: NEGATIVE mg/dL
Hgb urine dipstick: NEGATIVE
KETONES UR: NEGATIVE mg/dL
NITRITE: NEGATIVE
PH: 6.5 (ref 5.0–8.0)
Protein, ur: NEGATIVE mg/dL
SPECIFIC GRAVITY, URINE: 1.019 (ref 1.005–1.030)

## 2016-06-10 LAB — URINALYSIS, MICROSCOPIC (REFLEX)

## 2016-06-10 MED ORDER — ACETAMINOPHEN 500 MG PO TABS
1000.0000 mg | ORAL_TABLET | Freq: Once | ORAL | Status: DC
Start: 2016-06-10 — End: 2016-06-10
  Filled 2016-06-10: qty 2

## 2016-06-10 MED ORDER — CEPHALEXIN 500 MG PO CAPS: 500.0000 mg | ORAL_CAPSULE | Freq: Two times a day (BID) | ORAL | 0 refills | Status: DC

## 2016-06-10 MED ORDER — ONDANSETRON 4 MG PO TBDP
4.0000 mg | ORAL_TABLET | Freq: Once | ORAL | Status: AC
  Administered 2016-06-10: 4 mg via ORAL
  Filled 2016-06-10: qty 1

## 2016-06-10 MED ORDER — ONDANSETRON HCL 4 MG/2ML IJ SOLN: 4.0000 mg | Freq: Once | INTRAMUSCULAR | Status: DC

## 2016-06-10 MED ORDER — SODIUM CHLORIDE 0.9 % IV BOLUS (SEPSIS): 1000.0000 mL | Freq: Once | INTRAVENOUS | Status: DC

## 2016-06-10 MED ORDER — CEPHALEXIN 250 MG PO CAPS
500.0000 mg | ORAL_CAPSULE | Freq: Once | ORAL | Status: AC
  Administered 2016-06-10: 500 mg via ORAL
  Filled 2016-06-10: qty 2

## 2016-06-10 NOTE — Discharge Instructions (Signed)
Take Zofran as needed for nausea. Avoid fatty foods. Follow-up with general surgery in your OB/GYN doctor. Return for uncontrollable pain, recurrent vomiting leading to dehydration, fevers, vaginal bleeding.  Follow up urine culture on 2 days.  Take antibiotics until then.  If you were given medicines take as directed.  If you are on coumadin or contraceptives realize their levels and effectiveness is altered by many different medicines.  If you have any reaction (rash, tongues swelling, other) to the medicines stop taking and see a physician.    If your blood pressure was elevated in the ER make sure you follow up for management with a primary doctor or return for chest pain, shortness of breath or stroke symptoms.  Please follow up as directed and return to the ER or see a physician for new or worsening symptoms.  Thank you. Vitals:   06/10/16 0322 06/10/16 0323  BP: 115/71   Pulse: 88   Resp: 18   Temp: 97.9 F (36.6 C)   TempSrc: Oral   SpO2: 100%   Weight:  270 lb (122.5 kg)  Height:  5\' 2"  (1.575 m)

## 2016-06-10 NOTE — ED Triage Notes (Signed)
Pt states known biliary colic. Pain feels like the same. Pt is [redacted] weeks pregnant with twins. Denies pain in lower abd.

## 2016-06-10 NOTE — ED Provider Notes (Signed)
[facility]   CSN: [id number] Arrival date & time: 06/10/16  0315     History   Chief Complaint Chief Complaint  Patient presents with  . Abdominal Pain    HPI Traci Peterson is a 31 y.o. female.  Patient with known gallstones and currently [redacted] weeks pregnant with twins presents with right upper quadrant pain. Patient had cheese and fried chicken tonight which led to this. No fevers or chills. Nausea with mild right upper quadrant tenderness. No significant radiation. Symptoms mildly improved since they started. Patient has not seen a surgeon to discuss gallstones. No vaginal symptoms.    Abdominal Pain   Pertinent negatives include fever, dysuria, hematuria and arthralgias.    Past Medical History:  Diagnosis Date  . Anxiety    no current med.  . Chronic headaches   . Gall stones   . HPV in female   . Jaw snapping    states jaw pops if opens mouth too wide  . Menstrual bleeding problem    menses from 12/22/2012 - current (03/06/2013)  . Ovarian cyst   . Pilonidal cyst 02/2013  . Vaginal Pap smear, abnormal     Patient Active Problem List   Diagnosis Date Noted  . Family history of breast cancer in female 06/06/2016  . Twin pregnancy with problem affecting pregnancy, antepartum 06/06/2016  . Routine general medical examination at a health care facility 08/08/2015  . Tinea pedis, recurrent 08/08/2015  . Pernicious anemia 08/08/2015  . Morbidly obese (HCC) 10/25/2014  . Eczema 10/25/2014  . 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 Chronic headaches -  no meds 10/25/2014  . Anxiety and depression 11/30/2013  . Tobacco abuse 05/25/2013  . Atypical squamous cells of undetermined significance with positive high risk human papillomavirus 05/08/2013  . PCOS (polycystic ovarian syndrome) 04/20/2013    Past Surgical History:  Procedure Laterality Date  .  PILONIDAL CYST EXCISION N/A 03/12/2013   Procedure: CYST EXCISION PILONIDAL EXTENSIVE;  Surgeon: Shelly Rubenstein, MD;  Location: [facility];  Service: General;  Laterality: N/A;  . WISDOM TOOTH EXTRACTION      OB History    Gravida Para Term Preterm AB Living   2 1   1    0   SAB TAB Ectopic Multiple Live Births         0         Home Medications    Prior to Admission medications   Medication Sig Start Date End Date Taking? Authorizing Provider  cephALEXin (KEFLEX) 500 MG capsule Take 1 capsule (500 mg total) by mouth 2 (two) times daily. 06/10/16   Blane Ohara, MD  docusate sodium (COLACE) 100 MG capsule Take 1 capsule (100 mg total) by mouth 2 (two) times daily as needed. 06/06/16   Lesly Dukes, MD  folic acid (FOLVITE) 800 MCG tablet Take 1 tablet (800 mcg total) by mouth daily. 06/06/16   Lesly Dukes, MD  polyethylene glycol powder (MIRALAX) powder Take 17 g by mouth daily. 06/06/16   Lesly Dukes, MD  Prenatal Vit-Fe Fumarate-FA (PRENATAL MULTIVITAMIN) TABS tablet Take 1 tablet by mouth at bedtime.    Historical Provider, MD    Family History Family History  Problem Relation Age of Onset  . Hypertension Mother   . Hyperlipidemia Mother   . Diabetes Mother   . Thyroid disease Mother   .  Diabetes Father   . Hypertension Father   . Alcohol abuse Father   . Breast cancer Maternal Aunt   . Lung cancer Paternal Grandfather     Social History Social History  Substance Use Topics  . Smoking status: Former Smoker    Packs/day: 0.50    Years: 6.00    Types: Cigarettes    Quit date: 03/28/2016  . Smokeless tobacco: Never Used     Comment: 3-5 cig./day  . Alcohol use No     Comment: socially     Allergies   Patient has no known allergies.   Review of Systems Review of Systems  Constitutional: Positive for appetite change. Negative for chills and fever.  HENT: Negative for ear pain and sore throat.   Eyes: Negative for pain and visual  disturbance.  Respiratory: Negative for cough and shortness of breath.   Cardiovascular: Negative for chest pain and palpitations.  Gastrointestinal: Positive for abdominal pain.  Genitourinary: Negative for dysuria and hematuria.  Musculoskeletal: Negative for arthralgias and back pain.  Skin: Negative for color change and rash.  Neurological: Negative for seizures and syncope.  All other systems reviewed and are negative.    Physical Exam Updated Vital Signs BP 115/71 (BP Location: Left Arm)   Pulse 88   Temp 97.9 F (36.6 C) (Oral)   Resp 18   Ht 5\' 2"  (1.575 m)   Wt 270 lb (122.5 kg)   LMP 01/13/2016   SpO2 100%   BMI 49.38 kg/m   Physical Exam  Constitutional: She appears well-developed and well-nourished. No distress.  HENT:  Head: Normocephalic and atraumatic.  Eyes: Conjunctivae are normal.  Neck: Neck supple.  Cardiovascular: Normal rate and regular rhythm.   No murmur heard. Abdominal: Soft. There is tenderness (mild right upper quadrant).  Musculoskeletal: She exhibits no edema.  Neurological: She is alert.  Skin: Skin is warm and dry.  Psychiatric: She has a normal mood and affect.  Nursing note and vitals reviewed.    ED Treatments / Results  Labs (all labs ordered are listed, but only abnormal results are displayed) Labs Reviewed  URINALYSIS, ROUTINE W REFLEX MICROSCOPIC - Abnormal; Notable for the following:       Result Value   APPearance CLOUDY (*)    Leukocytes, UA MODERATE (*)    All other components within normal limits  URINALYSIS, MICROSCOPIC (REFLEX) - Abnormal; Notable for the following:    Bacteria, UA MANY (*)    Squamous Epithelial / LPF 6-30 (*)    All other components within normal limits  URINE CULTURE    EKG  EKG Interpretation None       Radiology No results found.  Procedures Procedures (including critical care time)  EMERGENCY DEPARTMENT BILIARY ULTRASOUND INTERPRETATION "Study: Limited Abdominal Ultrasound of  the gallbladder and common bile duct."  INDICATIONS: RUQ pain Indication: Multiple views of the gallbladder and common bile duct were obtained in real-time with a Multi-frequency probe." PERFORMED BY:  Myself IMAGES ARCHIVED?: Yes FINDINGS: Gallstones present, Gallbladder wall normal in thickness, Sonographic Murphy's sign absent and Common bile duct normal in size LIMITATIONS: Body Habitus and Bowel Gas INTERPRETATION: Cholelithiasis  CPT Code [phone] (limited abdominal)  EMERGENCY DEPARTMENT [place] PREGNANCY "Study: Limited Ultrasound of the Pelvis for Pregnancy"  INDICATIONS:Pregnancy(required) Multiple views of the uterus and pelvic cavity were obtained in real-time with a multi-frequency probe.  APPROACH:Transabdominal   PERFORMED BY: Myself  IMAGES ARCHIVED?: Yes  LIMITATIONS: Body habitus  INTERPRETATION: Viable intrauterine pregnancy  FETAL HEART RATE: 130s, twins w separate gestational sacs  CPT Codes:  [phone] (transabdominal OB)  [phone] (transvaginal OB, Reduced level of service for incomplete exam)   Medications Ordered in ED Medications  acetaminophen (TYLENOL) tablet 1,000 mg (1,000 mg Oral Not Given 06/10/16 0523)  cephALEXin (KEFLEX) capsule 500 mg (not administered)  ondansetron (ZOFRAN-ODT) disintegrating tablet 4 mg (4 mg Oral Given 06/10/16 0454)     Initial Impression / Assessment and Plan / ED Course  I have reviewed the triage vital signs and the nursing notes.  Pertinent labs & imaging results that were available during my care of the patient were reviewed by me and considered in my medical decision making (see chart for details).  Clinical Course    Patient presents with recurrent right upper quadrant abdominal pain. Stressed follow-up with general surgery. Patient is pregnant intrauterine live fetuses. Bedside ultrasound no signs of cholecystitis and no enlarged, bile duct. Zofran given patient time by mouth. Discussed close outpatient follow.  Urinalysis pending.  Plan for po abx and fup urine culture, asymptomatic however pregnant.   Results and differential diagnosis were discussed with the patient/parent/guardian. Xrays were independently reviewed by myself.  Close follow up outpatient was discussed, comfortable with the plan.   Medications  acetaminophen (TYLENOL) tablet 1,000 mg (1,000 mg Oral Not Given 06/10/16 0523)  cephALEXin (KEFLEX) capsule 500 mg (not administered)  ondansetron (ZOFRAN-ODT) disintegrating tablet 4 mg (4 mg Oral Given 06/10/16 0454)    Vitals:   06/10/16 0322 06/10/16 0323  BP: 115/71   Pulse: 88   Resp: 18   Temp: 97.9 F (36.6 C)   TempSrc: Oral   SpO2: 100%   Weight:  270 lb (122.5 kg)  Height:  5\' 2"  (1.575 m)    Final diagnoses:  Biliary colic  Abdominal pain during pregnancy, first trimester     Final Clinical Impressions(s) / ED Diagnoses   Final diagnoses:  Biliary colic  Abdominal pain during pregnancy, first trimester    New Prescriptions New Prescriptions   CEPHALEXIN (KEFLEX) 500 MG CAPSULE    Take 1 capsule (500 mg total) by mouth 2 (two) times daily.     Blane OharaJoshua Marlia Schewe, MD 06/10/16 [phone]

## 2016-06-11 LAB — URINE CULTURE

## 2016-06-12 ENCOUNTER — Telehealth: Payer: Self-pay | Admitting: *Deleted

## 2016-06-12 DIAGNOSIS — B9689 Other specified bacterial agents as the cause of diseases classified elsewhere: Secondary | ICD-10-CM

## 2016-06-12 DIAGNOSIS — N760 Acute vaginitis: Secondary | ICD-10-CM

## 2016-06-12 MED ORDER — TERCONAZOLE 0.4 % VA CREA: 1.0000 | TOPICAL_CREAM | Freq: Every day | VAGINAL | 0 refills | Status: DC

## 2016-06-12 MED ORDER — METRONIDAZOLE 500 MG PO TABS: 500.0000 mg | ORAL_TABLET | Freq: Two times a day (BID) | ORAL | 0 refills | Status: DC

## 2016-06-12 NOTE — Telephone Encounter (Signed)
-----   Message from Lesly DukesKelly H Leggett, MD sent at 06/11/2016  1:55 PM EST ----- Pt needs 2 hour GTT.  Treat BV with Flagyl.

## 2016-06-12 NOTE — Telephone Encounter (Signed)
Pt notified of BV results.  She also went to ED with a UTI and was put on Keflex.  Pt states that she is very prone to yeast infections and would also like a RX for Terazol 7 to have on hand.  [facility]

## 2016-06-14 ENCOUNTER — Inpatient Hospital Stay (HOSPITAL_COMMUNITY)
Admission: AD | Admit: 2016-06-14 | Discharge: 2016-06-14 | Disposition: A | Discharge status: Home / Self Care | Payer: Medicaid Other | Source: Ambulatory Visit | Attending: Obstetrics & Gynecology | Admitting: Obstetrics & Gynecology

## 2016-06-14 ENCOUNTER — Encounter: Payer: Self-pay | Admitting: Family

## 2016-06-14 DIAGNOSIS — Z87891 Personal history of nicotine dependence: Secondary | ICD-10-CM | POA: Insufficient documentation

## 2016-06-14 DIAGNOSIS — Z833 Family history of diabetes mellitus: Secondary | ICD-10-CM | POA: Insufficient documentation

## 2016-06-14 DIAGNOSIS — Z803 Family history of malignant neoplasm of breast: Secondary | ICD-10-CM | POA: Diagnosis not present

## 2016-06-14 DIAGNOSIS — O3481 Maternal care for other abnormalities of pelvic organs, first trimester: Secondary | ICD-10-CM | POA: Diagnosis present

## 2016-06-14 DIAGNOSIS — Z801 Family history of malignant neoplasm of trachea, bronchus and lung: Secondary | ICD-10-CM | POA: Diagnosis not present

## 2016-06-14 DIAGNOSIS — Z811 Family history of alcohol abuse and dependence: Secondary | ICD-10-CM | POA: Diagnosis not present

## 2016-06-14 DIAGNOSIS — O26891 Other specified pregnancy related conditions, first trimester: Secondary | ICD-10-CM

## 2016-06-14 DIAGNOSIS — O99341 Other mental disorders complicating pregnancy, first trimester: Secondary | ICD-10-CM | POA: Insufficient documentation

## 2016-06-14 DIAGNOSIS — Z3A10 10 weeks gestation of pregnancy: Secondary | ICD-10-CM | POA: Insufficient documentation

## 2016-06-14 DIAGNOSIS — Z8249 Family history of ischemic heart disease and other diseases of the circulatory system: Secondary | ICD-10-CM | POA: Insufficient documentation

## 2016-06-14 DIAGNOSIS — O219 Vomiting of pregnancy, unspecified: Secondary | ICD-10-CM | POA: Diagnosis not present

## 2016-06-14 DIAGNOSIS — R12 Heartburn: Secondary | ICD-10-CM | POA: Diagnosis not present

## 2016-06-14 DIAGNOSIS — Z9889 Other specified postprocedural states: Secondary | ICD-10-CM | POA: Diagnosis not present

## 2016-06-14 DIAGNOSIS — Z79899 Other long term (current) drug therapy: Secondary | ICD-10-CM | POA: Insufficient documentation

## 2016-06-14 LAB — CBC
HCT: 39 % (ref 36.0–46.0)
Hemoglobin: 13.8 g/dL (ref 12.0–15.0)
MCH: 30.5 pg (ref 26.0–34.0)
MCHC: 35.4 g/dL (ref 30.0–36.0)
MCV: 86.1 fL (ref 78.0–100.0)
PLATELETS: 238 10*3/uL (ref 150–400)
RBC: 4.53 MIL/uL (ref 3.87–5.11)
RDW: 14.7 % (ref 11.5–15.5)
WBC: 11 10*3/uL — ABNORMAL HIGH (ref 4.0–10.5)

## 2016-06-14 LAB — COMPREHENSIVE METABOLIC PANEL
ALK PHOS: 50 U/L (ref 38–126)
ALT: 29 U/L (ref 14–54)
ANION GAP: 9 (ref 5–15)
AST: 32 U/L (ref 15–41)
Albumin: 3.6 g/dL (ref 3.5–5.0)
BUN: 8 mg/dL (ref 6–20)
CALCIUM: 9.5 mg/dL (ref 8.9–10.3)
CHLORIDE: 105 mmol/L (ref 101–111)
CO2: 21 mmol/L — ABNORMAL LOW (ref 22–32)
CREATININE: 0.5 mg/dL (ref 0.44–1.00)
Glucose, Bld: 106 mg/dL — ABNORMAL HIGH (ref 65–99)
Potassium: 3.8 mmol/L (ref 3.5–5.1)
SODIUM: 135 mmol/L (ref 135–145)
Total Bilirubin: 0.7 mg/dL (ref 0.3–1.2)
Total Protein: 7 g/dL (ref 6.5–8.1)

## 2016-06-14 LAB — URINALYSIS, ROUTINE W REFLEX MICROSCOPIC
BILIRUBIN URINE: NEGATIVE
GLUCOSE, UA: NEGATIVE mg/dL
KETONES UR: 80 mg/dL — AB
NITRITE: NEGATIVE
PH: 6 (ref 5.0–8.0)
Protein, ur: NEGATIVE mg/dL
SPECIFIC GRAVITY, URINE: 1.019 (ref 1.005–1.030)

## 2016-06-14 MED ORDER — PROMETHAZINE HCL 25 MG/ML IJ SOLN
25.0000 mg | Freq: Once | INTRAMUSCULAR | Status: AC
  Administered 2016-06-14: 25 mg via INTRAVENOUS
  Filled 2016-06-14: qty 1

## 2016-06-14 MED ORDER — RANITIDINE HCL 150 MG PO TABS: 150.0000 mg | ORAL_TABLET | Freq: Two times a day (BID) | ORAL | 0 refills | Status: DC

## 2016-06-14 MED ORDER — FAMOTIDINE IN NACL 20-0.9 MG/50ML-% IV SOLN
20.0000 mg | Freq: Once | INTRAVENOUS | Status: AC
  Administered 2016-06-14: 20 mg via INTRAVENOUS
  Filled 2016-06-14: qty 50

## 2016-06-14 MED ORDER — PROMETHAZINE HCL 25 MG PO TABS: 25.0000 mg | ORAL_TABLET | Freq: Four times a day (QID) | ORAL | 0 refills | Status: DC | PRN

## 2016-06-14 MED ORDER — DEXTROSE 5 % IN LACTATED RINGERS IV BOLUS
1000.0000 mL | Freq: Once | INTRAVENOUS | Status: AC
  Administered 2016-06-14: 1000 mL via INTRAVENOUS

## 2016-06-14 NOTE — Discharge Instructions (Signed)
Heartburn During Pregnancy Heartburn is a type of pain or discomfort that can happen in the throat or chest. It is often described as a burning sensation. Heartburn is common during pregnancy because:  A hormone (progesterone) that is released during pregnancy may relax the valve (lower esophageal sphincter, or LES) that separates the esophagus from the stomach. This allows stomach acid to move up into the esophagus, causing heartburn.  The uterus gets larger and pushes up on the stomach, which pushes more acid into the esophagus. This is especially true in the later stages of pregnancy. Heartburn usually goes away or gets better after giving birth. What are the causes? Heartburn is caused by stomach acid backing up into the esophagus (reflux). Reflux can be triggered by:  Changing hormone levels.  Large meals.  Certain foods and beverages, such as coffee, chocolate, onions, and peppermint.  Exercise.  Increased stomach acid production. What increases the risk? You are more likely to experience heartburn during pregnancy if you:  Had heartburn prior to becoming pregnant.  Have been pregnant more than once before.  Are overweight or obese. The likelihood that you will get heartburn also increases as you get farther along in your pregnancy, especially during the last trimester. What are the signs or symptoms? Symptoms of this condition include:  Burning pain in the chest or lower throat.  Bitter taste in the mouth.  Coughing.  Problems swallowing.  Vomiting.  Hoarse voice.  Asthma. Symptoms may get worse when you lie down or bend over. Symptoms are often worse at night. How is this diagnosed? This condition is diagnosed based on:  Your medical history.  Your symptoms.  Blood tests to check for a certain type of bacteria associated with heartburn.  Whether taking heartburn medicine relieves your symptoms.  Examination of the stomach and esophagus using a tube with  a light and camera on the end (endoscopy). How is this treated? Treatment varies depending on how severe your symptoms are. Your health care provider may recommend:  Over-the-counter medicines (antacids or acid reducers) for mild heartburn.  Prescription medicines to decrease stomach acid or to protect your stomach lining.  Certain changes in your diet.  Raising the head of your bed so it is higher than the foot of the bed. This helps prevent stomach acid from backing up into the esophagus when you are lying down. Follow these instructions at home: Eating and drinking  Do not drink alcohol during your pregnancy.  Identify foods and beverages that make your symptoms worse, and avoid them.  Beverages that you may want to avoid include:  Coffee and tea (with or without caffeine).  Energy drinks and sports drinks.  Carbonated drinks or sodas.  Citrus fruit juices.  Foods that you may want to avoid include:  Chocolate and cocoa.  Peppermint and mint flavorings.  Garlic, onions, and horseradish.  Spicy and acidic foods, including peppers, chili powder, curry powder, vinegar, hot sauces, and barbecue sauce.  Citrus fruits, such as oranges, lemons, and limes.  Tomato-based foods, such as red sauce, chili, and salsa.  Fried and fatty foods, such as donuts, french fries, potato chips, and high-fat dressings.  High-fat meats, such as hot dogs, cold cuts, sausage, ham, and bacon.  High-fat dairy items, such as whole milk, butter, and cheese.  Eat small, frequent meals instead of large meals.  Avoid drinking large amounts of liquid with your meals.  Avoid eating meals during the 2-3 hours before bedtime.  Avoid lying down right  after you eat.  Do not exercise right after you eat. Medicines  Take over-the-counter and prescription medicines only as told by your health care provider.  Do not take aspirin, ibuprofen, or other NSAIDs unless your health care provider tells  you to do that.  You may be instructed to avoid medicines that contain sodium bicarbonate. General instructions  If directed, raise the head of your bed about 6 inches (15 cm) by putting blocks under the legs. Sleeping with more pillows does not effectively relieve heartburn because it only changes the position of your head.  Do not use any products that contain nicotine or tobacco, such as cigarettes and e-cigarettes. If you need help quitting, ask your health care provider.  Wear loose-fitting clothing.  Try to reduce your stress, such as with yoga or meditation. If you need help managing stress, ask your health care provider.  Maintain a healthy weight. If you are overweight, work with your health care provider to safely lose weight.  Keep all follow-up visits as told by your health care provider. This is important. Contact a health care provider if:  You develop new symptoms.  Your symptoms do not improve with treatment.  You have unexplained weight loss.  You have difficulty swallowing.  You make loud sounds when you breathe (wheeze).  You have a cough that does not go away.  You have frequent heartburn for more than 2 weeks.  You have nausea or vomiting that does not get better with treatment.  You have pain in your abdomen. Get help right away if:  You have severe chest pain that spreads to your arm, neck, or jaw.  You feel sweaty, dizzy, or light-headed.  You have shortness of breath.  You have pain when swallowing.  You vomit, and your vomit looks like blood or coffee grounds.  Your stool is bloody or black. This information is not intended to replace advice given to you by your health care provider. Make sure you discuss any questions you have with your health care provider. Document Released: 05/11/2000 Document Revised: 01/30/2016 Document Reviewed: 01/30/2016 Elsevier Interactive Patient Education  2017 [employer]     Morning Sickness Morning  sickness is when you feel sick to your stomach (nauseous) during pregnancy. You may feel sick to your stomach and throw up (vomit). You may feel sick in the morning, but you can feel this way any time of day. Some women feel very sick to their stomach and cannot stop throwing up (hyperemesis gravidarum). Follow these instructions at home:  Only take medicines as told by your doctor.  Take multivitamins as told by your doctor. Taking multivitamins before getting pregnant can stop or lessen the harshness of morning sickness.  Eat dry toast or unsalted crackers before getting out of bed.  Eat 5 to 6 small meals a day.  Eat dry and bland foods like rice and baked potatoes.  Do not drink liquids with meals. Drink between meals.  Do not eat greasy, fatty, or spicy foods.  Have someone cook for you if the smell of food causes you to feel sick or throw up.  If you feel sick to your stomach after taking prenatal vitamins, take them at night or with a snack.  Eat protein when you need a snack (nuts, yogurt, cheese).  Eat unsweetened gelatins for dessert.  Wear a bracelet used for sea sickness (acupressure wristband).  Go to a doctor that puts thin needles into certain body points (acupuncture) to improve how  you feel.  Do not smoke.  Use a humidifier to keep the air in your house free of odors.  Get lots of fresh air. Contact a doctor if:  You need medicine to feel better.  You feel dizzy or lightheaded.  You are losing weight. Get help right away if:  You feel very sick to your stomach and cannot stop throwing up.  You pass out (faint). This information is not intended to replace advice given to you by your health care provider. Make sure you discuss any questions you have with your health care provider. Document Released: 06/21/2004 Document Revised: 10/20/2015 Document Reviewed: 10/29/2012 Elsevier Interactive Patient Education  2017 [employer].

## 2016-06-14 NOTE — [facility] Note (Signed)
Pt reports nausea since yesterday, vomiting x 3 since then. Feels very dehydrated. Also reports when she used the restroom in MAU there was some pink on the tissue.

## 2016-06-14 NOTE — MAU Provider Note (Signed)
History     CSN: [id number]  Arrival date and time: 06/14/16 2009   First Provider Initiated Contact with Patient 06/14/16 2057      Chief Complaint  Patient presents with  . Emesis During Pregnancy   HPI Traci Peterson is a 31 y.o. G2P0100 at [redacted]w[redacted]d with twins who presents with nausea & vomiting. Reports nausea since finding out she was pregnant but didn't start vomiting until 2 days ago. States she has vomited 4 times yesterday & 2 times today. Last tried to eat around 2 pm today but was unable to keep it down. Endorses heartburn during this pregnancy. Initially was treating with alka seltzer chews but stopped taking them after reading they weren't good in pregnancy. Currently not treating nausea or heartburn. Denies abdominal pain, diarrhea, constipation, fever, or vaginal bleeding.   OB History    Gravida Para Term Preterm AB Living   2 1   1    0   SAB TAB Ectopic Multiple Live Births         0        Past Medical History:  Diagnosis Date  . Anxiety    no current med.  . Chronic headaches   . Gall stones   . HPV in female   . Jaw snapping    states jaw pops if opens mouth too wide  . Menstrual bleeding problem    menses from 12/22/2012 - current (03/06/2013)  . Ovarian cyst   . Pilonidal cyst 02/2013  . Vaginal Pap smear, abnormal     Past Surgical History:  Procedure Laterality Date  . PILONIDAL CYST EXCISION N/A 03/12/2013   Procedure: CYST EXCISION PILONIDAL EXTENSIVE;  Surgeon: Shelly Rubenstein, MD;  Location: [facility];  Service: General;  Laterality: N/A;  . WISDOM TOOTH EXTRACTION      Family History  Problem Relation Age of Onset  . Hypertension Mother   . Hyperlipidemia Mother   . Diabetes Mother   . Thyroid disease Mother   . Diabetes Father   . Hypertension Father   . Alcohol abuse Father   . Breast cancer Maternal Aunt   . Lung cancer Paternal Grandfather     Social History  Substance Use Topics  . Smoking status: Former  Smoker    Packs/day: 0.50    Years: 6.00    Types: Cigarettes    Quit date: 03/28/2016  . Smokeless tobacco: Never Used     Comment: 3-5 cig./day  . Alcohol use No     Comment: socially    Allergies: No Known Allergies  Prescriptions Prior to Admission  Medication Sig Dispense Refill Last Dose  . cephALEXin (KEFLEX) 500 MG capsule Take 1 capsule (500 mg total) by mouth 2 (two) times daily. 14 capsule 0   . docusate sodium (COLACE) 100 MG capsule Take 1 capsule (100 mg total) by mouth 2 (two) times daily as needed. 30 capsule 2   . folic acid (FOLVITE) 800 MCG tablet Take 1 tablet (800 mcg total) by mouth daily. 30 tablet 1   . metroNIDAZOLE (FLAGYL) 500 MG tablet Take 1 tablet (500 mg total) by mouth 2 (two) times daily. 14 tablet 0   . polyethylene glycol powder (MIRALAX) powder Take 17 g by mouth daily. 255 g 0   . Prenatal Vit-Fe Fumarate-FA (PRENATAL MULTIVITAMIN) TABS tablet Take 1 tablet by mouth at bedtime.   Not Taking  . terconazole (TERAZOL 7) 0.4 % vaginal cream Place 1 applicator vaginally at bedtime.  45 g 0     Review of Systems  Constitutional: Negative.   Gastrointestinal: Positive for constipation, nausea and vomiting. Negative for abdominal pain and diarrhea.       + heartburn  Genitourinary: Negative.    Physical Exam   Blood pressure 114/63, pulse 88, temperature 97.6 F (36.4 C), temperature source Oral, resp. rate 18, height 5\' 2"  (1.575 m), weight 268 lb (121.6 kg), last menstrual period 01/13/2016.  Physical Exam  Nursing note and vitals reviewed. Constitutional: She is oriented to person, place, and time. She appears well-developed and well-nourished. No distress.  HENT:  Head: Normocephalic and atraumatic.  Eyes: Conjunctivae are normal. Right eye exhibits no discharge. Left eye exhibits no discharge. No scleral icterus.  Neck: Normal range of motion.  Cardiovascular: Normal rate, regular rhythm and normal heart sounds.   No murmur  heard. Respiratory: Effort normal and breath sounds normal. No respiratory distress. She has no wheezes.  GI: Soft. Bowel sounds are normal. There is no tenderness.  Neurological: She is alert and oriented to person, place, and time.  Skin: Skin is warm and dry. She is not diaphoretic.  Psychiatric: She has a normal mood and affect. Her behavior is normal. Judgment and thought content normal.    MAU Course  Procedures Results for orders placed or performed during the hospital encounter of 06/14/16 (from the past 24 hour(s))  Urinalysis, Routine w reflex microscopic     Status: Abnormal   Collection Time: 06/14/16  8:26 PM  Result Value Ref Range   Color, Urine YELLOW YELLOW   APPearance HAZY (A) CLEAR   Specific Gravity, Urine 1.019 1.005 - 1.030   pH 6.0 5.0 - 8.0   Glucose, UA NEGATIVE NEGATIVE mg/dL   Hgb urine dipstick LARGE (A) NEGATIVE   Bilirubin Urine NEGATIVE NEGATIVE   Ketones, ur 80 (A) NEGATIVE mg/dL   Protein, ur NEGATIVE NEGATIVE mg/dL   Nitrite NEGATIVE NEGATIVE   Leukocytes, UA SMALL (A) NEGATIVE   RBC / HPF 6-30 0 - 5 RBC/hpf   WBC, UA 6-30 0 - 5 WBC/hpf   Bacteria, UA FEW (A) NONE SEEN   Squamous Epithelial / LPF 6-30 (A) NONE SEEN   Mucous PRESENT   CBC     Status: Abnormal   Collection Time: 06/14/16  9:17 PM  Result Value Ref Range   WBC 11.0 (H) 4.0 - 10.5 K/uL   RBC 4.53 3.87 - 5.11 MIL/uL   Hemoglobin 13.8 12.0 - 15.0 g/dL   HCT 16.1 09.6 - 04.5 %   MCV 86.1 78.0 - 100.0 fL   MCH 30.5 26.0 - 34.0 pg   MCHC 35.4 30.0 - 36.0 g/dL   RDW 40.9 81.1 - 91.4 %   Platelets 238 150 - 400 K/uL  Comprehensive metabolic panel     Status: Abnormal   Collection Time: 06/14/16  9:17 PM  Result Value Ref Range   Sodium 135 135 - 145 mmol/L   Potassium 3.8 3.5 - 5.1 mmol/L   Chloride 105 101 - 111 mmol/L   CO2 21 (L) 22 - 32 mmol/L   Glucose, Bld 106 (H) 65 - 99 mg/dL   BUN 8 6 - 20 mg/dL   Creatinine, Ser 7.82 0.44 - 1.00 mg/dL   Calcium 9.5 8.9 - 95.6 mg/dL    Total Protein 7.0 6.5 - 8.1 g/dL   Albumin 3.6 3.5 - 5.0 g/dL   AST 32 15 - 41 U/L   ALT 29 14 - 54 U/L  Alkaline Phosphatase 50 38 - 126 U/L   Total Bilirubin 0.7 0.3 - 1.2 mg/dL   GFR calc non Af Amer >60 >60 mL/min   GFR calc Af Amer >60 >60 mL/min   Anion gap 9 5 - 15    MDM U/a -- 80 ketones IV fluid bolus, D5LR Promethazine 25 mg IV Famotidine 20 mg IV Pt reports improvement in symptoms & able to tolerate POs  Assessment and Plan  A: 1. Nausea and vomiting during pregnancy prior to [redacted] weeks gestation   2. Heartburn during pregnancy in first trimester    P: Discharge home Rx promethazine & ranitidine Advance diet as tolerated Discussed reasons to return to MAU Keep f/u with OB  Judeth HornErin Equan Cogbill 06/14/2016, 8:53 PM

## 2016-06-15 ENCOUNTER — Encounter (HOSPITAL_COMMUNITY): Payer: Self-pay | Admitting: Obstetrics & Gynecology

## 2016-06-16 LAB — CULTURE, OB URINE: Culture: <10000 — AB

## 2016-06-27 ENCOUNTER — Inpatient Hospital Stay (HOSPITAL_COMMUNITY)
Admission: AD | Admit: 2016-06-27 | Discharge: 2016-06-27 | Disposition: A | Discharge status: Home / Self Care | Payer: Medicaid Other | Source: Ambulatory Visit | Attending: Family Medicine | Admitting: Family Medicine

## 2016-06-27 ENCOUNTER — Ambulatory Visit (HOSPITAL_COMMUNITY): Admission: RE | Admit: 2016-06-27 | Payer: Medicaid Other | Source: Ambulatory Visit

## 2016-06-27 ENCOUNTER — Encounter (HOSPITAL_COMMUNITY): Payer: Self-pay | Admitting: *Deleted

## 2016-06-27 ENCOUNTER — Ambulatory Visit (HOSPITAL_COMMUNITY): Payer: Medicaid Other

## 2016-06-27 ENCOUNTER — Emergency Department
Admission: EM | Admit: 2016-06-27 | Discharge: 2016-06-27 | Discharge status: Absent without leave | Payer: Medicaid Other | Source: Home / Self Care

## 2016-06-27 ENCOUNTER — Ambulatory Visit (INDEPENDENT_AMBULATORY_CARE_PROVIDER_SITE_OTHER): Payer: Medicaid Other | Admitting: Obstetrics & Gynecology

## 2016-06-27 VITALS — BP 115/71 | HR 106 | Wt 267.0 lb

## 2016-06-27 DIAGNOSIS — E669 Obesity, unspecified: Secondary | ICD-10-CM | POA: Insufficient documentation

## 2016-06-27 DIAGNOSIS — O30041 Twin pregnancy, dichorionic/diamniotic, first trimester: Secondary | ICD-10-CM

## 2016-06-27 DIAGNOSIS — O30009 Twin pregnancy, unspecified number of placenta and unspecified number of amniotic sacs, unspecified trimester: Secondary | ICD-10-CM

## 2016-06-27 DIAGNOSIS — Z87891 Personal history of nicotine dependence: Secondary | ICD-10-CM | POA: Insufficient documentation

## 2016-06-27 DIAGNOSIS — O211 Hyperemesis gravidarum with metabolic disturbance: Secondary | ICD-10-CM | POA: Diagnosis not present

## 2016-06-27 DIAGNOSIS — K219 Gastro-esophageal reflux disease without esophagitis: Secondary | ICD-10-CM | POA: Diagnosis not present

## 2016-06-27 DIAGNOSIS — O99611 Diseases of the digestive system complicating pregnancy, first trimester: Secondary | ICD-10-CM | POA: Diagnosis not present

## 2016-06-27 DIAGNOSIS — Z3A12 12 weeks gestation of pregnancy: Secondary | ICD-10-CM | POA: Diagnosis not present

## 2016-06-27 DIAGNOSIS — E860 Dehydration: Secondary | ICD-10-CM

## 2016-06-27 DIAGNOSIS — O99211 Obesity complicating pregnancy, first trimester: Secondary | ICD-10-CM | POA: Insufficient documentation

## 2016-06-27 DIAGNOSIS — O210 Mild hyperemesis gravidarum: Secondary | ICD-10-CM

## 2016-06-27 DIAGNOSIS — O30001 Twin pregnancy, unspecified number of placenta and unspecified number of amniotic sacs, first trimester: Secondary | ICD-10-CM

## 2016-06-27 DIAGNOSIS — Z803 Family history of malignant neoplasm of breast: Secondary | ICD-10-CM

## 2016-06-27 LAB — CBC
HEMATOCRIT: 39.7 % (ref 36.0–46.0)
Hemoglobin: 13.6 g/dL (ref 12.0–15.0)
MCH: 29.9 pg (ref 26.0–34.0)
MCHC: 34.3 g/dL (ref 30.0–36.0)
MCV: 87.3 fL (ref 78.0–100.0)
PLATELETS: 230 10*3/uL (ref 150–400)
RBC: 4.55 MIL/uL (ref 3.87–5.11)
RDW: 14.7 % (ref 11.5–15.5)
WBC: 9.6 10*3/uL (ref 4.0–10.5)

## 2016-06-27 LAB — COMPREHENSIVE METABOLIC PANEL
ALT: 24 U/L (ref 14–54)
ANION GAP: 7 (ref 5–15)
AST: 28 U/L (ref 15–41)
Albumin: 3.8 g/dL (ref 3.5–5.0)
Alkaline Phosphatase: 47 U/L (ref 38–126)
BILIRUBIN TOTAL: 0.6 mg/dL (ref 0.3–1.2)
BUN: 6 mg/dL (ref 6–20)
CHLORIDE: 105 mmol/L (ref 101–111)
CO2: 24 mmol/L (ref 22–32)
Calcium: 8.9 mg/dL (ref 8.9–10.3)
Creatinine, Ser: 0.55 mg/dL (ref 0.44–1.00)
Glucose, Bld: 104 mg/dL — ABNORMAL HIGH (ref 65–99)
POTASSIUM: 4.1 mmol/L (ref 3.5–5.1)
Sodium: 136 mmol/L (ref 135–145)
TOTAL PROTEIN: 7.6 g/dL (ref 6.5–8.1)

## 2016-06-27 LAB — URINALYSIS, ROUTINE W REFLEX MICROSCOPIC
BACTERIA UA: NONE SEEN
BILIRUBIN URINE: NEGATIVE
Ketones, ur: 5 mg/dL — AB
NITRITE: NEGATIVE
Protein, ur: 30 mg/dL — AB
SPECIFIC GRAVITY, URINE: 1.027 (ref 1.005–1.030)
pH: 5 (ref 5.0–8.0)

## 2016-06-27 MED ORDER — LACTATED RINGERS IV BOLUS (SEPSIS)
1000.0000 mL | Freq: Once | INTRAVENOUS | Status: AC
  Administered 2016-06-27: 1000 mL via INTRAVENOUS

## 2016-06-27 MED ORDER — PROMETHAZINE HCL 25 MG RE SUPP: 25.0000 mg | Freq: Four times a day (QID) | RECTAL | 1 refills | Status: DC | PRN

## 2016-06-27 MED ORDER — METOCLOPRAMIDE HCL 5 MG/ML IJ SOLN
10.0000 mg | Freq: Once | INTRAMUSCULAR | Status: AC
  Administered 2016-06-27: 10 mg via INTRAVENOUS
  Filled 2016-06-27: qty 2

## 2016-06-27 MED ORDER — ONDANSETRON 8 MG PO TBDP: 4.0000 mg | ORAL_TABLET | Freq: Four times a day (QID) | ORAL | 1 refills | Status: DC | PRN

## 2016-06-27 MED ORDER — ONDANSETRON HCL 40 MG/20ML IJ SOLN
8.0000 mg | Freq: Once | INTRAMUSCULAR | Status: DC
  Filled 2016-06-27: qty 4

## 2016-06-27 MED ORDER — FAMOTIDINE IN NACL 20-0.9 MG/50ML-% IV SOLN
20.0000 mg | Freq: Once | INTRAVENOUS | Status: AC
  Administered 2016-06-27: 20 mg via INTRAVENOUS
  Filled 2016-06-27: qty 50

## 2016-06-27 MED ORDER — METOCLOPRAMIDE HCL 10 MG PO TABS: 10.0000 mg | ORAL_TABLET | Freq: Four times a day (QID) | ORAL | 0 refills | Status: DC | PRN

## 2016-06-27 NOTE — MAU Provider Note (Signed)
History     CSN: [id number]  Arrival date and time: 06/27/16 1155   First Provider Initiated Contact with Patient 06/27/16 1316      Chief Complaint  Patient presents with  . Emesis   G2P0100 @12 .3 weeks here with worsening N/V. She has used Zantac and Phenergan which worked for a few days then sx worsened. She is not tolerating po food or fluids. She also reports burning in throat. She endorses bilateral upper quadrant abdominal pain since N/V started. She describes as muscle pain from vomiting.  She was given Rx for Zofran today but has not taken it. She reports constipation during this pregnancy. Review of records reveals pregnancy has been complicated by twin pregnancy, obesity, and HEG.    OB History    Gravida Para Term Preterm AB Living   2 1   1    0   SAB TAB Ectopic Multiple Live Births         0        Past Medical History:  Diagnosis Date  . Anxiety    no current med.  . Chronic headaches   . Gall stones   . HPV in female   . Jaw snapping    states jaw pops if opens mouth too wide  . Menstrual bleeding problem    menses from 12/22/2012 - current (03/06/2013)  . Ovarian cyst   . Pilonidal cyst 02/2013  . Vaginal Pap smear, abnormal     Past Surgical History:  Procedure Laterality Date  . PILONIDAL CYST EXCISION N/A 03/12/2013   Procedure: CYST EXCISION PILONIDAL EXTENSIVE;  Surgeon: Shelly Rubensteinouglas A Blackman, MD;  Location: [facility];  Service: General;  Laterality: N/A;  . WISDOM TOOTH EXTRACTION      Family History  Problem Relation Age of Onset  . Hypertension Mother   . Hyperlipidemia Mother   . Diabetes Mother   . Thyroid disease Mother   . Diabetes Father   . Hypertension Father   . Alcohol abuse Father   . Breast cancer Maternal Aunt   . Lung cancer Paternal Grandfather     Social History  Substance Use Topics  . Smoking status: Former Smoker    Packs/day: 0.50    Years: 6.00    Types: Cigarettes    Quit date: 03/28/2016  .  Smokeless tobacco: Former NeurosurgeonUser     Comment: 3-5 cig./day  . Alcohol use No     Comment: socially    Allergies: No Known Allergies  Prescriptions Prior to Admission  Medication Sig Dispense Refill Last Dose  . docusate sodium (COLACE) 100 MG capsule Take 1 capsule (100 mg total) by mouth 2 (two) times daily as needed. 30 capsule 2 Taking  . folic acid (FOLVITE) 800 MCG tablet Take 1 tablet (800 mcg total) by mouth daily. 30 tablet 1 Taking  . ondansetron (ZOFRAN-ODT) 8 MG disintegrating tablet Take 0.5 tablets (4 mg total) by mouth every 6 (six) hours as needed for nausea. 45 tablet 1   . polyethylene glycol powder (MIRALAX) powder Take 17 g by mouth daily. 255 g 0 Taking  . Prenatal Vit-Fe Fumarate-FA (PRENATAL MULTIVITAMIN) TABS tablet Take 1 tablet by mouth at bedtime.   Taking  . promethazine (PHENERGAN) 25 MG suppository Place 1 suppository (25 mg total) rectally every 6 (six) hours as needed for nausea or vomiting. 45 suppository 1   . ranitidine (ZANTAC) 150 MG tablet Take 1 tablet (150 mg total) by mouth 2 (two) times daily.  60 tablet 0 Taking    Review of Systems  Constitutional: Negative for fever.  Gastrointestinal: Positive for abdominal pain, constipation, nausea and vomiting. Negative for diarrhea.  Genitourinary: Negative for vaginal bleeding.   Physical Exam   Blood pressure 134/75, pulse 103, temperature 98.2 F (36.8 C), temperature source Oral, resp. rate 18, height 5\' 2"  (1.575 m), weight 121.1 kg (267 lb), last menstrual period 01/13/2016.  Physical Exam  Nursing note and vitals reviewed. Constitutional: She is oriented to person, place, and time. She appears well-developed and well-nourished. No distress.  HENT:  Head: Normocephalic and atraumatic.  Neck: Normal range of motion.  Respiratory: Effort normal.  GI: Soft. She exhibits no distension and no mass. There is tenderness (mild, upper quadrants and epigastric). There is no rebound and no guarding.   Musculoskeletal: Normal range of motion.  Neurological: She is alert and oriented to person, place, and time.  Skin: Skin is warm and dry.  Psychiatric: She has a normal mood and affect.   Results for orders placed or performed during the hospital encounter of 06/27/16 (from the past 24 hour(s))  Urinalysis, Routine w reflex microscopic     Status: Abnormal   Collection Time: 06/27/16 12:05 PM  Result Value Ref Range   Color, Urine YELLOW YELLOW   APPearance HAZY (A) CLEAR   Specific Gravity, Urine 1.027 1.005 - 1.030   pH 5.0 5.0 - 8.0   Glucose, UA >=500 (A) NEGATIVE mg/dL   Hgb urine dipstick MODERATE (A) NEGATIVE   Bilirubin Urine NEGATIVE NEGATIVE   Ketones, ur 5 (A) NEGATIVE mg/dL   Protein, ur 30 (A) NEGATIVE mg/dL   Nitrite NEGATIVE NEGATIVE   Leukocytes, UA TRACE (A) NEGATIVE   RBC / HPF 6-30 0 - 5 RBC/hpf   WBC, UA 6-30 0 - 5 WBC/hpf   Bacteria, UA NONE SEEN NONE SEEN   Squamous Epithelial / LPF 6-30 (A) NONE SEEN   Mucous PRESENT   CBC     Status: None   Collection Time: 06/27/16  2:24 PM  Result Value Ref Range   WBC 9.6 4.0 - 10.5 K/uL   RBC 4.55 3.87 - 5.11 MIL/uL   Hemoglobin 13.6 12.0 - 15.0 g/dL   HCT 16.1 09.6 - 04.5 %   MCV 87.3 78.0 - 100.0 fL   MCH 29.9 26.0 - 34.0 pg   MCHC 34.3 30.0 - 36.0 g/dL   RDW 40.9 81.1 - 91.4 %   Platelets 230 150 - 400 K/uL  Comprehensive metabolic panel     Status: Abnormal   Collection Time: 06/27/16  2:24 PM  Result Value Ref Range   Sodium 136 135 - 145 mmol/L   Potassium 4.1 3.5 - 5.1 mmol/L   Chloride 105 101 - 111 mmol/L   CO2 24 22 - 32 mmol/L   Glucose, Bld 104 (H) 65 - 99 mg/dL   BUN 6 6 - 20 mg/dL   Creatinine, Ser 7.82 0.44 - 1.00 mg/dL   Calcium 8.9 8.9 - 95.6 mg/dL   Total Protein 7.6 6.5 - 8.1 g/dL   Albumin 3.8 3.5 - 5.0 g/dL   AST 28 15 - 41 U/L   ALT 24 14 - 54 U/L   Alkaline Phosphatase 47 38 - 126 U/L   Total Bilirubin 0.6 0.3 - 1.2 mg/dL   GFR calc non Af Amer >60 >60 mL/min   GFR calc Af  Amer >60 >60 mL/min   Anion gap 7 5 - 15   MAU  Course  Procedures LR 1L  Reglan 10 mg IV Pepcid 20 mg IV Po challenge  MDM Labs ordered and reviewed. No episode of emesis. Tolerating po food and fluids. Stable for discharge home. Recommend Reglan vs Zofran d/t pre-existing constipation.   Assessment and Plan   1. [redacted] weeks gestation of pregnancy   2. Hyperemesis gravidarum   3. Twin pregnancy with problem affecting pregnancy, antepartum   4. Dichorionic diamniotic twin pregnancy in first trimester   5. Dehydration   6. Gastroesophageal reflux disease without esophagitis    Discharge home Follow up in office as scheduled Return for worsening sx  Allergies as of 06/27/2016   No Known Allergies     Medication List    STOP taking these medications   ondansetron 8 MG disintegrating tablet Commonly known as:  ZOFRAN ODT     TAKE these medications   docusate sodium 100 MG capsule Commonly known as:  COLACE Take 1 capsule (100 mg total) by mouth 2 (two) times daily as needed.   folic acid 800 MCG tablet Commonly known as:  FOLVITE Take 1 tablet (800 mcg total) by mouth daily.   metoCLOPramide 10 MG tablet Commonly known as:  REGLAN Take 1 tablet (10 mg total) by mouth every 6 (six) hours as needed for nausea.   polyethylene glycol powder powder Commonly known as:  MIRALAX Take 17 g by mouth daily.   prenatal multivitamin Tabs tablet Take 1 tablet by mouth at bedtime.   promethazine 25 MG suppository Commonly known as:  PHENERGAN Place 1 suppository (25 mg total) rectally every 6 (six) hours as needed for nausea or vomiting.   ranitidine 150 MG tablet Commonly known as:  ZANTAC Take 1 tablet (150 mg total) by mouth 2 (two) times daily.      Donette Larry, CNM 06/27/2016, 1:53 PM

## 2016-06-27 NOTE — Discharge Instructions (Signed)
Dehydration, Adult °Dehydration is when there is not enough fluid or water in your body. This happens when you lose more fluids than you take in. Dehydration can range from mild to very bad. It should be treated right away to keep it from getting very bad. °Symptoms of mild dehydration may include: °· Thirst. °· Dry lips. °· Slightly dry mouth. °· Dry, warm skin. °· Dizziness. °Symptoms of moderate dehydration may include: °· Very dry mouth. °· Muscle cramps. °· Dark pee (urine). Pee may be the color of tea. °· Your body making less pee. °· Your eyes making fewer tears. °· Heartbeat that is uneven or faster than normal (palpitations). °· Headache. °· Light-headedness, especially when you stand up from sitting. °· Fainting (syncope). °Symptoms of very bad dehydration may include: °· Changes in skin, such as: °¨ Cold and clammy skin. °¨ Blotchy (mottled) or pale skin. °¨ Skin that does not quickly return to normal after being lightly pinched and let go (poor skin turgor). °· Changes in body fluids, such as: °¨ Feeling very thirsty. °¨ Your eyes making fewer tears. °¨ Not sweating when body temperature is high, such as in hot weather. °¨ Your body making very little pee. °· Changes in vital signs, such as: °¨ Weak pulse. °¨ Pulse that is more than 100 beats a minute when you are sitting still. °¨ Fast breathing. °¨ Low blood pressure. °· Other changes, such as: °¨ Sunken eyes. °¨ Cold hands and feet. °¨ Confusion. °¨ Lack of energy (lethargy). °¨ Trouble waking up from sleep. °¨ Short-term weight loss. °¨ Unconsciousness. °Follow these instructions at home: °· If told by your doctor, drink an ORS: °¨ Make an ORS by using instructions on the package. °¨ Start by drinking small amounts, about ½ cup (120 mL) every 5-10 minutes. °¨ Slowly drink more until you have had the amount that your doctor said to have. °· Drink enough clear fluid to keep your pee clear or pale yellow. If you were told to drink an ORS, finish the ORS  first, then start slowly drinking clear fluids. Drink fluids such as: °¨ Water. Do not drink only water by itself. Doing that can make the salt (sodium) level in your body get too low (hyponatremia). °¨ Ice chips. °¨ Fruit juice that you have added water to (diluted). °¨ Low-calorie sports drinks. °· Avoid: °¨ Alcohol. °¨ Drinks that have a lot of sugar. These include high-calorie sports drinks, fruit juice that does not have water added, and soda. °¨ Caffeine. °¨ Foods that are greasy or have a lot of fat or sugar. °· Take over-the-counter and prescription medicines only as told by your doctor. °· Do not take salt tablets. Doing that can make the salt level in your body get too high (hypernatremia). °· Eat foods that have minerals (electrolytes). Examples include bananas, oranges, potatoes, tomatoes, and spinach. °· Keep all follow-up visits as told by your doctor. This is important. °Contact a doctor if: °· You have belly (abdominal) pain that: °¨ Gets worse. °¨ Stays in one area (localizes). °· You have a rash. °· You have a stiff neck. °· You get angry or annoyed more easily than normal (irritability). °· You are more sleepy than normal. °· You have a harder time waking up than normal. °· You feel: °¨ Weak. °¨ Dizzy. °¨ Very thirsty. °· You have peed (urinated) only a small amount of very dark pee during 6-8 hours. °Get help right away if: °· You have symptoms of   very bad dehydration. °· You cannot drink fluids without throwing up (vomiting). °· Your symptoms get worse with treatment. °· You have a fever. °· You have a very bad headache. °· You are throwing up or having watery poop (diarrhea) and it: °¨ Gets worse. °¨ Does not go away. °· You have blood or something green (bile) in your throw-up. °· You have blood in your poop (stool). This may cause poop to look black and tarry. °· You have not peed in 6-8 hours. °· You pass out (faint). °· Your heart rate when you are sitting still is more than 100 beats a  minute. °· You have trouble breathing. °This information is not intended to replace advice given to you by your health care provider. Make sure you discuss any questions you have with your health care provider. °Document Released: 03/10/2009 Document Revised: 12/02/2015 Document Reviewed: 07/08/2015 °Elsevier Interactive Patient Education © 2017 [employer] ° °Viral Gastroenteritis, Adult °Introduction °Viral gastroenteritis is also known as the stomach flu. This condition is caused by certain germs (viruses). These germs can be passed from person to person very easily (are very contagious). This condition can cause sudden watery poop (diarrhea), fever, and throwing up (vomiting). °Having watery poop and throwing up can make you feel weak and cause you to get dehydrated. Dehydration can make you tired and thirsty, make you have a dry mouth, and make it so you pee (urinate) less often. Older adults and people with other diseases or a weak defense system (immune system) are at higher risk for dehydration. It is important to replace the fluids that you lose from having watery poop and throwing up. °Follow these instructions at home: °Follow instructions from your doctor about how to care for yourself at home. °Eating and drinking °Follow these instructions as told by your doctor: °· Take an oral rehydration solution (ORS). This is a drink that is sold at pharmacies and stores. °· Drink clear fluids in small amounts as you are able, such as: °¨ Water. °¨ Ice chips. °¨ Diluted fruit juice. °¨ Low-calorie sports drinks. °· Eat bland, easy-to-digest foods in small amounts as you are able, such as: °¨ Bananas. °¨ Applesauce. °¨ Rice. °¨ Low-fat (lean) meats. °¨ Toast. °¨ Crackers. °· Avoid fluids that have a lot of sugar or caffeine in them. °· Avoid alcohol. °· Avoid spicy or fatty foods. °General instructions °· Drink enough fluid to keep your pee (urine) clear or pale yellow. °· Wash your hands often. If you cannot use  soap and water, use hand sanitizer. °· Make sure that all people in your home wash their hands well and often. °· Rest at home while you get better. °· Take over-the-counter and prescription medicines only as told by your doctor. °· Watch your condition for any changes. °· Take a warm bath to help with any burning or pain from having watery poop. °· Keep all follow-up visits as told by your doctor. This is important. °Contact a doctor if: °· You cannot keep fluids down. °· Your symptoms get worse. °· You have new symptoms. °· You feel light-headed or dizzy. °· You have muscle cramps. °Get help right away if: °· You have chest pain. °· You feel very weak or you pass out (faint). °· You see blood in your throw-up. °· Your throw-up looks like coffee grounds. °· You have bloody or black poop (stools) or poop that look like tar. °· You have a very bad headache, a stiff neck, or   both. °· You have a rash. °· You have very bad pain, cramping, or bloating in your belly (abdomen). °· You have trouble breathing. °· You are breathing very quickly. °· Your heart is beating very quickly. °· Your skin feels cold and clammy. °· You feel confused. °· You have pain when you pee. °· You have signs of dehydration, such as: °¨ Dark pee, hardly any pee, or no pee. °¨ Cracked lips. °¨ Dry mouth. °¨ Sunken eyes. °¨ Sleepiness. °¨ Weakness. °This information is not intended to replace advice given to you by your health care provider. Make sure you discuss any questions you have with your health care provider. °Document Released: 10/31/2007 Document Revised: 12/02/2015 Document Reviewed: 01/18/2015 °© 2017 Elsevier ° °

## 2016-06-27 NOTE — MAU Note (Signed)
Pt sent from OB to Urgent Care for IV fluids but urgent care does not take pregnancy medicaid so pt came here; pt has appointment at 1445 in clinic;

## 2016-06-27 NOTE — MAU Provider Note (Cosign Needed)
Chief Complaint:  Emesis   First Provider Initiated Contact with Patient 06/27/16 1316      HPI: Traci Peterson is a 31 y.o. G2P0100 at [redacted]w[redacted]d who presents to maternity admissions reporting a 7 day history of nausea & vomiting. The patient reports a chronic non-radiating upper quadrant abdominal pain & burning throat pain that start with her vomiting. She reports daily nausea with multiple episodes of non-bilous non-bloody emesis & subsequent poor PO intake. The patient reports the nausea and emesis is unresponsive to phenergan & zantac she received on 1/18.  She reports severe constipation treated with milk magnesia enema(last BM was 1/30) & had recently resolved vaginal bleeding. At her 1/31 prenatal visit this morning ketones and an elevated specific gravity were noted.  She denies fever, eating any unique food, recent travel, pain with urination new vaginal discharge, or new odors.      Pregnancy Course:   Past Medical History: Past Medical History:  Diagnosis Date  . Anxiety    no current med.  . Chronic headaches   . Gall stones   . HPV in female   . Jaw snapping    states jaw pops if opens mouth too wide  . Menstrual bleeding problem    menses from 12/22/2012 - current (03/06/2013)  . Ovarian cyst   . Pilonidal cyst 02/2013  . Vaginal Pap smear, abnormal     Past obstetric history: OB History  Gravida Para Term Preterm AB Living  2 1   1    0  SAB TAB Ectopic Multiple Live Births        0      # Outcome Date GA Lbr Len/2nd Weight Sex Delivery Anes PTL Lv  2 Current           1 Preterm 11/12/14 [redacted]w[redacted]d  0.374 kg (13.2 oz) M Vag-Spont None  FD      Past Surgical History: Past Surgical History:  Procedure Laterality Date  . PILONIDAL CYST EXCISION N/A 03/12/2013   Procedure: CYST EXCISION PILONIDAL EXTENSIVE;  Surgeon: Shelly Rubenstein, MD;  Location: [facility];  Service: General;  Laterality: N/A;  . WISDOM TOOTH EXTRACTION       Family  History: Family History  Problem Relation Age of Onset  . Hypertension Mother   . Hyperlipidemia Mother   . Diabetes Mother   . Thyroid disease Mother   . Diabetes Father   . Hypertension Father   . Alcohol abuse Father   . Breast cancer Maternal Aunt   . Lung cancer Paternal Grandfather     Social History: Social History  Substance Use Topics  . Smoking status: Former Smoker    Packs/day: 0.50    Years: 6.00    Types: Cigarettes    Quit date: 03/28/2016  . Smokeless tobacco: Former Neurosurgeon     Comment: 3-5 cig./day  . Alcohol use No     Comment: socially    Allergies: No Known Allergies  Meds:  Prescriptions Prior to Admission  Medication Sig Dispense Refill Last Dose  . docusate sodium (COLACE) 100 MG capsule Take 1 capsule (100 mg total) by mouth 2 (two) times daily as needed. 30 capsule 2 Past Week at Unknown time  . folic acid (FOLVITE) 800 MCG tablet Take 1 tablet (800 mcg total) by mouth daily. 30 tablet 1 Past Week at Unknown time  . Prenatal Vit-Fe Fumarate-FA (PRENATAL MULTIVITAMIN) TABS tablet Take 1 tablet by mouth at bedtime.   Past Week at Unknown  time  . ranitidine (ZANTAC) 150 MG tablet Take 1 tablet (150 mg total) by mouth 2 (two) times daily. 60 tablet 0 06/26/2016 at Unknown time  . ondansetron (ZOFRAN-ODT) 8 MG disintegrating tablet Take 0.5 tablets (4 mg total) by mouth every 6 (six) hours as needed for nausea. 45 tablet 1   . polyethylene glycol powder (MIRALAX) powder Take 17 g by mouth daily. (Patient not taking: Reported on 06/27/2016) 255 g 0 Not Taking at Unknown time  . promethazine (PHENERGAN) 25 MG suppository Place 1 suppository (25 mg total) rectally every 6 (six) hours as needed for nausea or vomiting. 45 suppository 1     I have reviewed patient's Past Medical Hx, Surgical Hx, Family Hx, Social Hx, medications and allergies.   ROS:  A comprehensive ROS was negative except per HPI.    Physical Exam   Patient Vitals for the past 24 hrs:  BP  Temp Temp src Pulse Resp Height Weight  06/27/16 1250 134/75 - - 103 18 - -  06/27/16 1202 125/60 98.2 F (36.8 C) Oral 106 18 5\' 2"  (1.575 m) 121.1 kg (267 lb)   Constitutional: Well-developed, well-nourished female in no acute distress.  HEENT: MMM,  Cardiovascular: RRR, normal s1 & s2, no M/R/G Respiratory: CTAB, normal work of breathing GI: Abd soft, tender to deep palpation in RUQ >LUQ,Pos BS Ms:+2 dorsalis pedis pulses, no edema, normal ROM Neurologic: Alert and oriented x 3.     Labs: Results for orders placed or performed during the hospital encounter of 06/27/16 (from the past 24 hour(s))  Urinalysis, Routine w reflex microscopic     Status: Abnormal   Collection Time: 06/27/16 12:05 PM  Result Value Ref Range   Color, Urine YELLOW YELLOW   APPearance HAZY (A) CLEAR   Specific Gravity, Urine 1.027 1.005 - 1.030   pH 5.0 5.0 - 8.0   Glucose, UA >=500 (A) NEGATIVE mg/dL   Hgb urine dipstick MODERATE (A) NEGATIVE   Bilirubin Urine NEGATIVE NEGATIVE   Ketones, ur 5 (A) NEGATIVE mg/dL   Protein, ur 30 (A) NEGATIVE mg/dL   Nitrite NEGATIVE NEGATIVE   Leukocytes, UA TRACE (A) NEGATIVE   RBC / HPF 6-30 0 - 5 RBC/hpf   WBC, UA 6-30 0 - 5 WBC/hpf   Bacteria, UA NONE SEEN NONE SEEN   Squamous Epithelial / LPF 6-30 (A) NONE SEEN   Mucous PRESENT     Imaging:  [place] Ob Comp Less 14 Wks  Result Date: 05/30/2016 CLINICAL DATA:  Pelvic pain affecting early pregnancy EXAM: TWIN OBSTETRIC <14WK US AND TRANSVAGINAL OB US COMPARISON:  None. FINDINGS: Number of IUPs:  2 Chorionicity/Amnionicity:  Dichorionic diamniotic TWIN 1 Yolk sac:  Visualized Embryo:  Visualized Cardiac Activity: Visualized Heart Rate: 166 bpm MSD:   mm    w     d CRL:  16.1  mm   8 w 0 d                  US EDC: 01/09/2017 TWIN 2 Yolk sac:  Visualized Embryo:  Visualized Cardiac Activity: Visualized Heart Rate: 171 bpm MSD:   mm    w     d CRL:  16.0  mm   8 w 0 d                  US EDC: 01/09/2017 Subchorionic  hemorrhage: Moderate subchorionic hemorrhage measuring 3.2 x 2.8 x 2.0 cm. Maternal uterus/adnexae: There is a benign-appearing cyst in the  right ovary measuring 5.6 x 5.5 x 4.9 cm. No free fluid IMPRESSION: Twin pregnancy, estimated gestational age [redacted] weeks 0 day. Moderate-sized subchorionic hemorrhage. Benign-appearing 5.6 cm right ovarian cyst. Electronically Signed   By: Charlett Nose M.D.   On: 05/30/2016 15:15   US Ob Transvaginal  Result Date: 05/30/2016 CLINICAL DATA:  Pelvic pain affecting early pregnancy EXAM: TWIN OBSTETRIC <14WK [place] AND TRANSVAGINAL OB US COMPARISON:  None. FINDINGS: Number of IUPs:  2 Chorionicity/Amnionicity:  Dichorionic diamniotic TWIN 1 Yolk sac:  Visualized Embryo:  Visualized Cardiac Activity: Visualized Heart Rate: 166 bpm MSD:   mm    w     d CRL:  16.1  mm   8 w 0 d                  [place] EDC: 01/09/2017 TWIN 2 Yolk sac:  Visualized Embryo:  Visualized Cardiac Activity: Visualized Heart Rate: 171 bpm MSD:   mm    w     d CRL:  16.0  mm   8 w 0 d                  [place] EDC: 01/09/2017 Subchorionic hemorrhage: Moderate subchorionic hemorrhage measuring 3.2 x 2.8 x 2.0 cm. Maternal uterus/adnexae: There is a benign-appearing cyst in the right ovary measuring 5.6 x 5.5 x 4.9 cm. No free fluid IMPRESSION: Twin pregnancy, estimated gestational age [redacted] weeks 0 day. Moderate-sized subchorionic hemorrhage. Benign-appearing 5.6 cm right ovarian cyst. Electronically Signed   By: Charlett Nose M.D.   On: 05/30/2016 15:15    MAU Course:   MDM: Plan of care reviewed with patient, including labs and tests ordered and medical treatment.   Assessment: 1. Hyperemesis gravidarum   2. Twin pregnancy with problem affecting pregnancy, antepartum     Plan: Discharge home in stable condition.  Place on IVF  offer Zofran & Mirilax PRN for symptom relief Reschedule prenatal care appointments        Collie Siad, Medical Student MS3 06/27/2016 2:35 PM

## 2016-06-27 NOTE — MAU Note (Signed)
Ongoing problems with vomiting, 4#wt loss at clinic today.  Sent in for fluids,  Was given zofran before she left.

## 2016-06-27 NOTE — Progress Notes (Signed)
Ketones-Mod, Blood-small Doing early 2 hr today    PRENATAL VISIT NOTE  Subjective:  Traci Peterson is a 31 y.o. G2P0100 at 2965w0d being seen today for ongoing prenatal care.  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  Patient reports vomiting.   . Vag. Bleeding: Scant.   . Denies leaking of fluid.   The following portions of the patient's history were reviewed and updated as appropriate: allergies, current medications, past family history, past medical history, past social history, past surgical history and problem list. Problem list updated.  Objective:   Vitals:   06/27/16 0827  BP: 115/71  Pulse: (!) 106  Weight: 267 lb (121.1 kg)    Fetal Status: Fetal Heart Rate (bpm): 152/153         General:  Alert, oriented and cooperative. Patient is in no acute distress.  Skin: Skin is warm and dry. No rash noted.   Cardiovascular: Normal heart rate noted  Respiratory: Normal respiratory effort, no problems with respiration noted  Abdomen: Soft, gravid, appropriate for gestational age.       Pelvic:  Cervical exam deferred        Extremities: Normal range of motion.  Edema: Trace  Mental Status: Normal mood and affect. Normal behavior. Normal judgment and thought content.   Assessment and Plan:  Pregnancy: G2P0100 at 3265w0d  1. [redacted] weeks gestation of pregnancy - Glucose Tolerance, 2 Hours w/1 Hour - First screen  today  2. Twin pregnancy with problem affecting pregnancy, antepartum + FH x2 on US  3. Family history of breast cancer in female -Pt believes that bother were post menopausal.  Will discuss further after pregnancy.  4. Hyperemesis gravidarum 4 lb weight lost, + ketones. Phenergan supp Zofran ODT Urgent care today for fluids and reglan If znatac doesn't help the refulx, will change to PPI like prevacid or prilosec  Preterm labor symptoms and general obstetric precautions including but not limited to vaginal bleeding, contractions, leaking of fluid and fetal movement were reviewed in detail with the patient. Please refer to After Visit Summary for other counseling recommendations.  Return in about 3 weeks (around 07/18/2016).   Lesly DukesKelly H Marien Manship, MD

## 2016-06-28 ENCOUNTER — Encounter (HOSPITAL_COMMUNITY): Payer: Self-pay

## 2016-06-28 ENCOUNTER — Other Ambulatory Visit: Payer: Self-pay | Admitting: Obstetrics & Gynecology

## 2016-06-28 ENCOUNTER — Telehealth: Payer: Self-pay

## 2016-06-28 ENCOUNTER — Ambulatory Visit (HOSPITAL_COMMUNITY)
Admit: 2016-06-28 | Discharge: 2016-06-28 | Disposition: A | Discharge status: Home / Self Care | Payer: Medicaid Other | Attending: Obstetrics & Gynecology | Admitting: Obstetrics & Gynecology

## 2016-06-28 DIAGNOSIS — Z3A12 12 weeks gestation of pregnancy: Secondary | ICD-10-CM

## 2016-06-28 DIAGNOSIS — Z3682 Encounter for antenatal screening for nuchal translucency: Secondary | ICD-10-CM

## 2016-06-28 DIAGNOSIS — Z6841 Body Mass Index (BMI) 40.0 and over, adult: Secondary | ICD-10-CM

## 2016-06-28 DIAGNOSIS — Z8751 Personal history of pre-term labor: Secondary | ICD-10-CM

## 2016-06-28 DIAGNOSIS — O210 Mild hyperemesis gravidarum: Secondary | ICD-10-CM

## 2016-06-28 DIAGNOSIS — O30009 Twin pregnancy, unspecified number of placenta and unspecified number of amniotic sacs, unspecified trimester: Secondary | ICD-10-CM

## 2016-06-28 DIAGNOSIS — Z3A09 9 weeks gestation of pregnancy: Secondary | ICD-10-CM

## 2016-06-28 HISTORY — DX: Polycystic ovarian syndrome: E28.2

## 2016-06-28 LAB — GLUCOSE TOLERANCE, 2 HOURS W/ 1HR
GLUCOSE, 2 HOUR: 123 mg/dL (ref <140)
Glucose, 1 hour: 179 mg/dL
Glucose, Fasting: 90 mg/dL (ref 65–99)

## 2016-06-28 NOTE — Telephone Encounter (Signed)
Left message on pt's phone letting her know that she passed her 2 hour GTT 

## 2016-06-29 ENCOUNTER — Other Ambulatory Visit (HOSPITAL_COMMUNITY): Payer: Self-pay | Admitting: *Deleted

## 2016-06-29 DIAGNOSIS — O30042 Twin pregnancy, dichorionic/diamniotic, second trimester: Secondary | ICD-10-CM

## 2016-07-10 ENCOUNTER — Telehealth (HOSPITAL_COMMUNITY): Payer: Self-pay | Admitting: MS"

## 2016-07-10 NOTE — Telephone Encounter (Signed)
Patient called to inquire about Panorama results. Returned call to patient to let her know results are still pending. We will contact patient once results are available.   Clydie BraunKaren Katurah Karapetian 07/10/2016 9:02 AM

## 2016-07-11 ENCOUNTER — Inpatient Hospital Stay (HOSPITAL_COMMUNITY)
Admission: AD | Admit: 2016-07-11 | Discharge: 2016-07-11 | Disposition: A | Discharge status: Home / Self Care | Payer: Medicaid Other | Source: Ambulatory Visit | Attending: Obstetrics and Gynecology | Admitting: Obstetrics and Gynecology

## 2016-07-11 ENCOUNTER — Other Ambulatory Visit: Payer: Self-pay

## 2016-07-11 ENCOUNTER — Encounter (HOSPITAL_COMMUNITY): Payer: Self-pay | Admitting: *Deleted

## 2016-07-11 DIAGNOSIS — E860 Dehydration: Secondary | ICD-10-CM

## 2016-07-11 DIAGNOSIS — Z3A14 14 weeks gestation of pregnancy: Secondary | ICD-10-CM | POA: Diagnosis not present

## 2016-07-11 DIAGNOSIS — Z87891 Personal history of nicotine dependence: Secondary | ICD-10-CM | POA: Insufficient documentation

## 2016-07-11 DIAGNOSIS — O211 Hyperemesis gravidarum with metabolic disturbance: Secondary | ICD-10-CM | POA: Insufficient documentation

## 2016-07-11 DIAGNOSIS — O210 Mild hyperemesis gravidarum: Secondary | ICD-10-CM | POA: Diagnosis present

## 2016-07-11 DIAGNOSIS — O30042 Twin pregnancy, dichorionic/diamniotic, second trimester: Secondary | ICD-10-CM | POA: Diagnosis not present

## 2016-07-11 LAB — URINALYSIS, ROUTINE W REFLEX MICROSCOPIC
Bilirubin Urine: NEGATIVE
GLUCOSE, UA: NEGATIVE mg/dL
HGB URINE DIPSTICK: NEGATIVE
Ketones, ur: 5 mg/dL — AB
NITRITE: NEGATIVE
Protein, ur: NEGATIVE mg/dL
SPECIFIC GRAVITY, URINE: 1.02 (ref 1.005–1.030)
pH: 6 (ref 5.0–8.0)

## 2016-07-11 LAB — COMPREHENSIVE METABOLIC PANEL
ALK PHOS: 47 U/L (ref 38–126)
ALT: 18 U/L (ref 14–54)
AST: 18 U/L (ref 15–41)
Albumin: 3.2 g/dL — ABNORMAL LOW (ref 3.5–5.0)
Anion gap: 8 (ref 5–15)
BILIRUBIN TOTAL: 0.6 mg/dL (ref 0.3–1.2)
BUN: 6 mg/dL (ref 6–20)
CALCIUM: 8.9 mg/dL (ref 8.9–10.3)
CO2: 23 mmol/L (ref 22–32)
Chloride: 103 mmol/L (ref 101–111)
Creatinine, Ser: 0.53 mg/dL (ref 0.44–1.00)
GFR calc Af Amer: >60 mL/min (ref >60)
GLUCOSE: 80 mg/dL (ref 65–99)
POTASSIUM: 3.6 mmol/L (ref 3.5–5.1)
Sodium: 134 mmol/L — ABNORMAL LOW (ref 135–145)
TOTAL PROTEIN: 6.8 g/dL (ref 6.5–8.1)

## 2016-07-11 LAB — CBC
HEMATOCRIT: 37.7 % (ref 36.0–46.0)
HEMOGLOBIN: 13.1 g/dL (ref 12.0–15.0)
MCH: 29.9 pg (ref 26.0–34.0)
MCHC: 34.7 g/dL (ref 30.0–36.0)
MCV: 86.1 fL (ref 78.0–100.0)
Platelets: 235 10*3/uL (ref 150–400)
RBC: 4.38 MIL/uL (ref 3.87–5.11)
RDW: 14.5 % (ref 11.5–15.5)
WBC: 9.3 10*3/uL (ref 4.0–10.5)

## 2016-07-11 MED ORDER — METOCLOPRAMIDE HCL 10 MG PO TABS: 10.0000 mg | ORAL_TABLET | Freq: Four times a day (QID) | ORAL | 1 refills | Status: DC | PRN

## 2016-07-11 MED ORDER — M.V.I. ADULT IV INJ
Freq: Once | INTRAVENOUS | Status: AC
  Administered 2016-07-11: 15:00:00 via INTRAVENOUS
  Filled 2016-07-11: qty 10

## 2016-07-11 MED ORDER — LACTATED RINGERS IV BOLUS (SEPSIS)
1000.0000 mL | Freq: Once | INTRAVENOUS | Status: AC
  Administered 2016-07-11: 1000 mL via INTRAVENOUS

## 2016-07-11 MED ORDER — PROMETHAZINE HCL 25 MG RE SUPP: 25.0000 mg | Freq: Four times a day (QID) | RECTAL | 1 refills | Status: DC | PRN

## 2016-07-11 MED ORDER — METOCLOPRAMIDE HCL 5 MG/ML IJ SOLN
10.0000 mg | Freq: Once | INTRAMUSCULAR | Status: AC
  Administered 2016-07-11: 10 mg via INTRAVENOUS
  Filled 2016-07-11: qty 2

## 2016-07-11 MED ORDER — FAMOTIDINE IN NACL 20-0.9 MG/50ML-% IV SOLN
20.0000 mg | Freq: Once | INTRAVENOUS | Status: AC
  Administered 2016-07-11: 20 mg via INTRAVENOUS
  Filled 2016-07-11: qty 50

## 2016-07-11 NOTE — Discharge Instructions (Signed)
Eating Plan for Hyperemesis Gravidarum °Hyperemesis gravidarum is a severe form of morning sickness. Because this condition causes severe nausea and vomiting, it can lead to dehydration, malnutrition, and weight loss. One way to lessen the symptoms of nausea and vomiting is to follow the eating plan for hyperemesis gravidarum. It is often used along with prescribed medicines to control your symptoms. °What can I do to relieve my symptoms? °Listen to your body. Everyone is different and has different preferences. Find what works best for you. Take any of the following actions that are helpful to you: °· Eat and drink slowly. °· Eat 5-6 small meals daily instead of 3 large meals. °· Eat crackers before you get out of bed in the morning. °· Try having a snack in the middle of the night. °· Starchy foods are usually tolerated well. Examples include cereal, toast, bread, potatoes, pasta, rice, and pretzels. °· Ginger may help with nausea. Add ¼ tsp ground ginger to hot tea or choose ginger tea. °· Try drinking 100% fruit juice or an electrolyte drink. An electrolyte drink contains sodium, potassium, and chloride. °· Continue to take your prenatal vitamins as told by your health care provider. If you are having trouble taking your prenatal vitamins, talk with your health care provider about different options. °· Include at least 1 serving of protein with your meals and snacks. Protein options include meats or poultry, beans, nuts, eggs, and yogurt. Try eating a protein-rich snack before bed. Examples of these snacks include cheese and crackers or half of a peanut butter or turkey sandwich. °· Consider eliminating foods that trigger your symptoms. These may include spicy foods, coffee, high-fat foods, very sweet foods, and acidic foods. °· Try meals that have more protein combined with bland, salty, lower-fat, and dry foods, such as nuts, seeds, pretzels, crackers, and cereal. °· Talk with your healthcare provider about  starting a supplement of vitamin B6. °· Have fluids that are cold, clear, and carbonated or sour. Examples include lemonade, ginger ale, lemon-lime soda, ice water, and sparkling water. °· Try lemon or mint tea. °· Try brushing your teeth or using a mouth rinse after meals. °What should I avoid to reduce my symptoms? °Avoiding some of the following things may help reduce your symptoms. °· Foods with strong smells. Try eating meals in well-ventilated areas that are free of odors. °· Drinking water or other beverages with meals. Try not to drink anything during the 30 minutes before and after your meals. °· Drinking more than 1 cup of fluid at a time. Sometimes using a straw helps. °· Fried or high-fat foods, such as butter and cream sauces. °· Spicy foods. °· Skipping meals as best as you can. Nausea can be more intense on an empty stomach. If you cannot tolerate food at that time, do not force it. Try sucking on ice chips or other frozen items, and make up for missed calories later. °· Lying down within 2 hours after eating. °· Environmental triggers. These may include smoky rooms, closed spaces, rooms with strong smells, warm or humid places, overly loud and noisy rooms, and rooms with motion or flickering lights. °· Quick and sudden changes in your movement. °This information is not intended to replace advice given to you by your health care provider. Make sure you discuss any questions you have with your health care provider. °Document Released: 03/11/2007 Document Revised: 01/11/2016 Document Reviewed: 12/13/2015 °Elsevier Interactive Patient Education © 2017 [employer] °Hyperemesis Gravidarum °Hyperemesis gravidarum is a   severe form of nausea and vomiting that happens during pregnancy. Hyperemesis is worse than morning sickness. It may cause you to have nausea or vomiting all day for many days. It may keep you from eating and drinking enough food and liquids. Hyperemesis usually occurs during the first half  (the first 20 weeks) of pregnancy. It often goes away once a woman is in her second half of pregnancy. However, sometimes hyperemesis continues through an entire pregnancy. °What are the causes? °The cause of this condition is not known. It may be related to changes in chemicals (hormones) in the body during pregnancy, such as the high level of pregnancy hormone (human chorionic gonadotropin) or the increase in the female sex hormone (estrogen). °What are the signs or symptoms? °Symptoms of this condition include: °· Severe nausea and vomiting. °· Nausea that does not go away. °· Vomiting that does not allow you to keep any food down. °· Weight loss. °· Body fluid loss (dehydration). °· Having no desire to eat, or not liking food that you have previously enjoyed. °How is this diagnosed? °This condition may be diagnosed based on: °· A physical exam. °· Your medical history. °· Your symptoms. °· Blood tests. °· Urine tests. °How is this treated? °This condition may be managed with medicine. If medicines to do not help relieve nausea and vomiting, you may need to receive fluids through an IV tube at the hospital. °Follow these instructions at home: °· Take over-the-counter and prescription medicines only as told by your health care provider. °· Avoid iron pills and multivitamins that contain iron for the first 3-4 months of pregnancy. If you take prescription iron pills, do not stop taking them unless your health care provider approves. °· Take the following actions to help prevent nausea and vomiting: °¨ In the morning, before getting out of bed, try eating a couple of dry crackers or a piece of toast. °¨ Avoid foods and smells that upset your stomach. Fatty and spicy foods may make nausea worse. °¨ Eat 5-6 small meals a day. °¨ Do not drink fluids while eating meals. Drink between meals. °¨ Eat or suck on things that have ginger in them. Ginger can help relieve nausea. °¨ Avoid food preparation. The smell of food can  spoil your appetite or trigger nausea. °· Follow instructions from your health care provider about eating or drinking restrictions. °· For snacks, eat high-protein foods, such as cheese. °· Keep all follow-up and pre-birth (prenatal) visits as told by your health care provider. This is important. °Contact a health care provider if: °· You have pain in your abdomen. °· You have a severe headache. °· You have vision problems. °· You are losing weight. °Get help right away if: °· You cannot drink fluids without vomiting. °· You vomit blood. °· You have constant nausea and vomiting. °· You are very weak. °· You are very thirsty. °· You feel dizzy. °· You faint. °· You have a fever or other symptoms that last for more than 2-3 days. °· You have a fever and your symptoms suddenly get worse. °Summary °· Hyperemesis gravidarum is a severe form of nausea and vomiting that happens during pregnancy. °· Making some changes to your eating habits may help relieve nausea and vomiting. °· This condition may be managed with medicine. °· If medicines to do not help relieve nausea and vomiting, you may need to receive fluids through an IV tube at the hospital. °This information is not intended to replace advice   given to you by your health care provider. Make sure you discuss any questions you have with your health care provider. °Document Released: 05/14/2005 Document Revised: 01/11/2016 Document Reviewed: 01/11/2016 °Elsevier Interactive Patient Education © 2017 [employer] ° °

## 2016-07-11 NOTE — MAU Provider Note (Signed)
History     CSN: [id number]  Arrival date and time: 07/11/16 1058   First Provider Initiated Contact with Patient 07/11/16 1319      Chief Complaint  Patient presents with  . Morning Sickness   G2P0100 @14 .3 weeks w/twins here with worsening nausea and vomiting, and abdominal pain x2 days. Cannot tolerate po. She describes pain as constant pulling in the lower abdomen. Rates pain 5/10. She has not used anything for the pain. She has hx of HEG this pregnancy. She was originally using Zofran but has not used recently d/y severe constipation. She was seen in MAU 2 weeks ago, had good relief with Reglan and Rx sent but states pharmacy didn't get the prescription. She was also Rx'd Phenergan suppositories but reports pharmacy didn't receive.    OB History    Gravida Para Term Preterm AB Living   2 1   1    0   SAB TAB Ectopic Multiple Live Births         0        Past Medical History:  Diagnosis Date  . Anxiety    no current med.  . Chronic headaches   . Gall stones   . HPV in female   . Jaw snapping    states jaw pops if opens mouth too wide  . Menstrual bleeding problem    menses from 12/22/2012 - current (03/06/2013)  . Ovarian cyst   . PCOS (polycystic ovarian syndrome)   . Pilonidal cyst 02/2013  . Vaginal Pap smear, abnormal     Past Surgical History:  Procedure Laterality Date  . PILONIDAL CYST EXCISION N/A 03/12/2013   Procedure: CYST EXCISION PILONIDAL EXTENSIVE;  Surgeon: Shelly Rubensteinouglas A Blackman, MD;  Location: [facility];  Service: General;  Laterality: N/A;  . WISDOM TOOTH EXTRACTION      Family History  Problem Relation Age of Onset  . Hypertension Mother   . Hyperlipidemia Mother   . Diabetes Mother   . Thyroid disease Mother   . Diabetes Father   . Hypertension Father   . Alcohol abuse Father   . Breast cancer Maternal Aunt   . Lung cancer Paternal Grandfather     Social History  Substance Use Topics  . Smoking status: Former Smoker     Packs/day: 0.50    Years: 6.00    Types: Cigarettes    Quit date: 03/28/2016  . Smokeless tobacco: Former NeurosurgeonUser     Comment: 3-5 cig./day  . Alcohol use No     Comment: socially    Allergies: No Known Allergies  Prescriptions Prior to Admission  Medication Sig Dispense Refill Last Dose  . docusate sodium (COLACE) 100 MG capsule Take 1 capsule (100 mg total) by mouth 2 (two) times daily as needed. 30 capsule 2 Taking  . folic acid (FOLVITE) 800 MCG tablet Take 1 tablet (800 mcg total) by mouth daily. 30 tablet 1 Taking  . metoCLOPramide (REGLAN) 10 MG tablet Take 1 tablet (10 mg total) by mouth every 6 (six) hours as needed for nausea. (Patient not taking: Reported on 06/28/2016) 30 tablet 0 Not Taking  . ondansetron (ZOFRAN-ODT) 4 MG disintegrating tablet Take 8 mg by mouth every 8 (eight) hours as needed for nausea or vomiting.   Taking  . polyethylene glycol powder (MIRALAX) powder Take 17 g by mouth daily. 255 g 0 Taking  . Prenatal Vit-Fe Fumarate-FA (PRENATAL MULTIVITAMIN) TABS tablet Take 1 tablet by mouth at bedtime.   Taking  .  promethazine (PHENERGAN) 25 MG suppository Place 1 suppository (25 mg total) rectally every 6 (six) hours as needed for nausea or vomiting. 45 suppository 1 Taking  . ranitidine (ZANTAC) 150 MG tablet Take 1 tablet (150 mg total) by mouth 2 (two) times daily. 60 tablet 0 Taking    Review of Systems  Constitutional: Negative for chills and fever.  Gastrointestinal: Positive for abdominal pain, constipation, nausea and vomiting. Negative for diarrhea.  Genitourinary: Negative for dysuria and vaginal bleeding.   Physical Exam   Blood pressure 122/86, pulse 109, temperature 99.1 F (37.3 C), temperature source Axillary, resp. rate 18, weight 268 lb (121.6 kg), last menstrual period 01/13/2016.  Physical Exam  Nursing note and vitals reviewed. Constitutional: She is oriented to person, place, and time. She appears well-developed and well-nourished. No  distress.  HENT:  Head: Normocephalic and atraumatic.  Neck: Normal range of motion.  Respiratory: Effort normal.  GI: Soft. She exhibits no distension and no mass. There is no tenderness. There is no rebound and no guarding.  Musculoskeletal: Normal range of motion.  Neurological: She is alert and oriented to person, place, and time.  Skin: Skin is warm and dry.  Psychiatric: She has a normal mood and affect.   Bedside US: Twin A active w/FHR 160s, Twin B active w/FHR 150s, subj nml AFV x2.  Results for orders placed or performed during the hospital encounter of 07/11/16 (from the past 24 hour(s))  Urinalysis, Routine w reflex microscopic     Status: Abnormal   Collection Time: 07/11/16 11:20 AM  Result Value Ref Range   Color, Urine YELLOW YELLOW   APPearance HAZY (A) CLEAR   Specific Gravity, Urine 1.020 1.005 - 1.030   pH 6.0 5.0 - 8.0   Glucose, UA NEGATIVE NEGATIVE mg/dL   Hgb urine dipstick NEGATIVE NEGATIVE   Bilirubin Urine NEGATIVE NEGATIVE   Ketones, ur 5 (A) NEGATIVE mg/dL   Protein, ur NEGATIVE NEGATIVE mg/dL   Nitrite NEGATIVE NEGATIVE   Leukocytes, UA TRACE (A) NEGATIVE   RBC / HPF 0-5 0 - 5 RBC/hpf   WBC, UA 0-5 0 - 5 WBC/hpf   Bacteria, UA RARE (A) NONE SEEN   Squamous Epithelial / LPF 0-5 (A) NONE SEEN   Mucous PRESENT   CBC     Status: None   Collection Time: 07/11/16  1:58 PM  Result Value Ref Range   WBC 9.3 4.0 - 10.5 K/uL   RBC 4.38 3.87 - 5.11 MIL/uL   Hemoglobin 13.1 12.0 - 15.0 g/dL   HCT 16.1 09.6 - 04.5 %   MCV 86.1 78.0 - 100.0 fL   MCH 29.9 26.0 - 34.0 pg   MCHC 34.7 30.0 - 36.0 g/dL   RDW 40.9 81.1 - 91.4 %   Platelets 235 150 - 400 K/uL  Comprehensive metabolic panel     Status: Abnormal   Collection Time: 07/11/16  1:58 PM  Result Value Ref Range   Sodium 134 (L) 135 - 145 mmol/L   Potassium 3.6 3.5 - 5.1 mmol/L   Chloride 103 101 - 111 mmol/L   CO2 23 22 - 32 mmol/L   Glucose, Bld 80 65 - 99 mg/dL   BUN 6 6 - 20 mg/dL    Creatinine, Ser 7.82 0.44 - 1.00 mg/dL   Calcium 8.9 8.9 - 95.6 mg/dL   Total Protein 6.8 6.5 - 8.1 g/dL   Albumin 3.2 (L) 3.5 - 5.0 g/dL   AST 18 15 - 41 U/L  ALT 18 14 - 54 U/L   Alkaline Phosphatase 47 38 - 126 U/L   Total Bilirubin 0.6 0.3 - 1.2 mg/dL   GFR calc non Af Amer >60 >60 mL/min   GFR calc Af Amer >60 >60 mL/min   Anion gap 8 5 - 15   MAU Course  Procedures LR 1 L x1 Pepcid 20 mg IV Reglan 10 mg IV  MDM Labs ordered and reviewed. Tolerating po food and fluid after meds. Nausea improved. Abdominal discomfort likely d/t MSK strain with vomiting. Stable for discharge home.   Assessment and Plan   1. [redacted] weeks gestation of pregnancy   2. Dichorionic diamniotic twin pregnancy in second trimester   3. Hyperemesis gravidarum   4. Dehydration    Discharge home Follow up at [facility] as scheduled HEG diet Rx Reglan Rx Phenergan supp  Allergies as of 07/11/2016   No Known Allergies     Medication List    TAKE these medications   docusate sodium 100 MG capsule Commonly known as:  COLACE Take 1 capsule (100 mg total) by mouth 2 (two) times daily as needed.   folic acid 800 MCG tablet Commonly known as:  FOLVITE Take 1 tablet (800 mcg total) by mouth daily.   metoCLOPramide 10 MG tablet Commonly known as:  REGLAN Take 1 tablet (10 mg total) by mouth every 6 (six) hours as needed for nausea.   polyethylene glycol powder powder Commonly known as:  MIRALAX Take 17 g by mouth daily.   prenatal multivitamin Tabs tablet Take 1 tablet by mouth at bedtime.   promethazine 25 MG suppository Commonly known as:  PHENERGAN Place 1 suppository (25 mg total) rectally every 6 (six) hours as needed for nausea or vomiting.   ranitidine 150 MG tablet Commonly known as:  ZANTAC Take 1 tablet (150 mg total) by mouth 2 (two) times daily.      Donette Larry, CNM 07/11/2016, 1:19 PM

## 2016-07-11 NOTE — MAU Note (Signed)
Pt presents to MAU with complaints of nausea and vomiting. PT states she has phenergan tablets at home but is not able to keep those down either, pharmacy has not gotten phenergan supp in yet.

## 2016-07-12 ENCOUNTER — Telehealth (HOSPITAL_COMMUNITY): Payer: Self-pay | Admitting: MS"

## 2016-07-12 NOTE — Telephone Encounter (Signed)
Called Traci Peterson to discuss her prenatal cell free DNA test results.  [name] had Panorama testing through [facility].  Testing was offered because of increased NT measurement on ultrasound.   The patient was identified by name and DOB.  We reviewed that these are within normal limits, showing a less than 1 in 4,000 risk for trisomy 21 and less than 1 in 10,000 risk for trisomies 18 and 13. Testing identified dizygosity in the twin gestation. X chromosome analysis is not able to be performed in dizygotic gestations.  Testing was also consistent with female and female fetal sex.  The patient did wish to know fetal sex.  She understands that this testing does not identify all genetic conditions.  All questions were answered to her satisfaction, she was encouraged to call with additional questions or concerns.  Quinn PlowmanKaren Fuad Forget, MS Certified Genetic Counselor 07/12/2016 10:38 AM

## 2016-07-14 ENCOUNTER — Encounter (HOSPITAL_COMMUNITY): Payer: Self-pay | Admitting: *Deleted

## 2016-07-14 ENCOUNTER — Inpatient Hospital Stay (HOSPITAL_COMMUNITY)
Admission: AD | Admit: 2016-07-14 | Discharge: 2016-07-14 | Disposition: A | Discharge status: Home / Self Care | Payer: Medicaid Other | Source: Ambulatory Visit | Attending: Obstetrics and Gynecology | Admitting: Obstetrics and Gynecology

## 2016-07-14 DIAGNOSIS — Z811 Family history of alcohol abuse and dependence: Secondary | ICD-10-CM | POA: Insufficient documentation

## 2016-07-14 DIAGNOSIS — O9989 Other specified diseases and conditions complicating pregnancy, childbirth and the puerperium: Secondary | ICD-10-CM | POA: Insufficient documentation

## 2016-07-14 DIAGNOSIS — Z87891 Personal history of nicotine dependence: Secondary | ICD-10-CM | POA: Insufficient documentation

## 2016-07-14 DIAGNOSIS — F419 Anxiety disorder, unspecified: Secondary | ICD-10-CM | POA: Diagnosis not present

## 2016-07-14 DIAGNOSIS — Z9889 Other specified postprocedural states: Secondary | ICD-10-CM | POA: Insufficient documentation

## 2016-07-14 DIAGNOSIS — O219 Vomiting of pregnancy, unspecified: Secondary | ICD-10-CM | POA: Diagnosis not present

## 2016-07-14 DIAGNOSIS — Z833 Family history of diabetes mellitus: Secondary | ICD-10-CM | POA: Insufficient documentation

## 2016-07-14 DIAGNOSIS — Z801 Family history of malignant neoplasm of trachea, bronchus and lung: Secondary | ICD-10-CM | POA: Diagnosis not present

## 2016-07-14 DIAGNOSIS — Z3A14 14 weeks gestation of pregnancy: Secondary | ICD-10-CM | POA: Diagnosis not present

## 2016-07-14 DIAGNOSIS — O99612 Diseases of the digestive system complicating pregnancy, second trimester: Secondary | ICD-10-CM | POA: Diagnosis not present

## 2016-07-14 DIAGNOSIS — O218 Other vomiting complicating pregnancy: Secondary | ICD-10-CM | POA: Insufficient documentation

## 2016-07-14 DIAGNOSIS — O99342 Other mental disorders complicating pregnancy, second trimester: Secondary | ICD-10-CM | POA: Insufficient documentation

## 2016-07-14 DIAGNOSIS — Z79899 Other long term (current) drug therapy: Secondary | ICD-10-CM | POA: Diagnosis not present

## 2016-07-14 DIAGNOSIS — Z803 Family history of malignant neoplasm of breast: Secondary | ICD-10-CM | POA: Diagnosis not present

## 2016-07-14 DIAGNOSIS — O30009 Twin pregnancy, unspecified number of placenta and unspecified number of amniotic sacs, unspecified trimester: Secondary | ICD-10-CM

## 2016-07-14 DIAGNOSIS — O210 Mild hyperemesis gravidarum: Secondary | ICD-10-CM

## 2016-07-14 LAB — URINALYSIS, ROUTINE W REFLEX MICROSCOPIC
Bilirubin Urine: NEGATIVE
GLUCOSE, UA: 50 mg/dL — AB
Hgb urine dipstick: NEGATIVE
KETONES UR: NEGATIVE mg/dL
Nitrite: NEGATIVE
PH: 6 (ref 5.0–8.0)
Protein, ur: NEGATIVE mg/dL
SPECIFIC GRAVITY, URINE: 1.024 (ref 1.005–1.030)

## 2016-07-14 NOTE — MAU Note (Signed)
Was at work, had taken her meds( for heartburn) this morning; had  OJ and  Spicy pork rinds.  Threw up and there was some red flecks in the emesis.  Was concerned.

## 2016-07-14 NOTE — MAU Provider Note (Signed)
History     CSN: [id number]  Arrival date and time: 07/14/16 1023   First Provider Initiated Contact with Patient 07/14/16 1120      No chief complaint on file.  HPI [name] is a 31 y.o. G2P0100 at 3530w6d who presents to MAU today with complaint of flecks of blood in her emesis this morning. The patient is taking Reglan and Pepcid with good control of N/V most days. She states that after taking her medication she drank orange juice and ate pork rinds and then had one episode of emesis with a few flecks of blood. She denies abdominal pain, current nausea, vaginal bleeding or fever.   OB History    Gravida Para Term Preterm AB Living   2 1   1    0   SAB TAB Ectopic Multiple Live Births         0        Past Medical History:  Diagnosis Date  . Anxiety    no current med.  . Chronic headaches   . Gall stones   . HPV in female   . Jaw snapping    states jaw pops if opens mouth too wide  . Menstrual bleeding problem    menses from 12/22/2012 - current (03/06/2013)  . Ovarian cyst   . PCOS (polycystic ovarian syndrome)   . Pilonidal cyst 02/2013  . Vaginal Pap smear, abnormal     Past Surgical History:  Procedure Laterality Date  . PILONIDAL CYST EXCISION N/A 03/12/2013   Procedure: CYST EXCISION PILONIDAL EXTENSIVE;  Surgeon: Shelly Rubensteinouglas A Blackman, MD;  Location: [facility];  Service: General;  Laterality: N/A;  . WISDOM TOOTH EXTRACTION      Family History  Problem Relation Age of Onset  . Hypertension Mother   . Hyperlipidemia Mother   . Diabetes Mother   . Thyroid disease Mother   . Diabetes Father   . Hypertension Father   . Alcohol abuse Father   . Breast cancer Maternal Aunt   . Lung cancer Paternal Grandfather     Social History  Substance Use Topics  . Smoking status: Former Smoker    Packs/day: 0.50    Years: 6.00    Types: Cigarettes    Quit date: 03/28/2016  . Smokeless tobacco: Former NeurosurgeonUser     Comment: 3-5 cig./day  .  Alcohol use No     Comment: socially    Allergies: No Known Allergies  Prescriptions Prior to Admission  Medication Sig Dispense Refill Last Dose  . docusate sodium (COLACE) 100 MG capsule Take 1 capsule (100 mg total) by mouth 2 (two) times daily as needed. 30 capsule 2 Past Week at Unknown time  . folic acid (FOLVITE) 800 MCG tablet Take 1 tablet (800 mcg total) by mouth daily. 30 tablet 1 Past Week at Unknown time  . metoCLOPramide (REGLAN) 10 MG tablet Take 1 tablet (10 mg total) by mouth every 6 (six) hours as needed for nausea. 30 tablet 1   . polyethylene glycol powder (MIRALAX) powder Take 17 g by mouth daily. 255 g 0 Past Week at Unknown time  . Prenatal Vit-Fe Fumarate-FA (PRENATAL MULTIVITAMIN) TABS tablet Take 1 tablet by mouth at bedtime.   Past Week at Unknown time  . promethazine (PHENERGAN) 25 MG suppository Place 1 suppository (25 mg total) rectally every 6 (six) hours as needed for nausea or vomiting. 30 suppository 1   . ranitidine (ZANTAC) 150 MG tablet Take 1  tablet (150 mg total) by mouth 2 (two) times daily. 60 tablet 0 07/11/2016 at Unknown time    Review of Systems  Constitutional: Negative for fever.  Gastrointestinal: Positive for vomiting. Negative for abdominal pain, constipation, diarrhea and nausea.  Genitourinary: Negative for dysuria, frequency, urgency, vaginal bleeding and vaginal discharge.   Physical Exam   Blood pressure 125/75, pulse 94, temperature 98.3 F (36.8 C), temperature source Oral, resp. rate 18, weight 274 lb 12.8 oz (124.6 kg), last menstrual period 01/13/2016.  Physical Exam  Nursing note and vitals reviewed. Constitutional: She is oriented to person, place, and time. She appears well-developed and well-nourished. No distress.  HENT:  Head: Normocephalic and atraumatic.  Cardiovascular: Normal rate.   Respiratory: Effort normal.  GI: Soft. She exhibits no distension and no mass. There is no tenderness. There is no rebound and no  guarding.  Neurological: She is alert and oriented to person, place, and time.  Skin: Skin is warm and dry. No erythema.  Psychiatric: She has a normal mood and affect.    MAU Course  Procedures None  MDM FHR A - 159 bpm with doppler FHR B - 162 bpm with doppler  Assessment and Plan  A:  Twin IUP at [redacted]w[redacted]d Vomiting in pregnancy   P: Discharge home Continue previously prescribed medications for N/V and reflux Diet for N/V in pregnancy discussed Patient advised to follow-up with [facility] as scheduled this week for routine prenatal care and to start 17-P Patient may return to MAU as needed or if her condition were to change or worsen   Marny Lowenstein, PA-C  07/14/2016, 11:20 AM

## 2016-07-14 NOTE — Discharge Instructions (Signed)
Eating Plan for Hyperemesis Gravidarum °Hyperemesis gravidarum is a severe form of morning sickness. Because this condition causes severe nausea and vomiting, it can lead to dehydration, malnutrition, and weight loss. One way to lessen the symptoms of nausea and vomiting is to follow the eating plan for hyperemesis gravidarum. It is often used along with prescribed medicines to control your symptoms. °What can I do to relieve my symptoms? °Listen to your body. Everyone is different and has different preferences. Find what works best for you. Take any of the following actions that are helpful to you: °· Eat and drink slowly. °· Eat 5-6 small meals daily instead of 3 large meals. °· Eat crackers before you get out of bed in the morning. °· Try having a snack in the middle of the night. °· Starchy foods are usually tolerated well. Examples include cereal, toast, bread, potatoes, pasta, rice, and pretzels. °· Ginger may help with nausea. Add ¼ tsp ground ginger to hot tea or choose ginger tea. °· Try drinking 100% fruit juice or an electrolyte drink. An electrolyte drink contains sodium, potassium, and chloride. °· Continue to take your prenatal vitamins as told by your health care provider. If you are having trouble taking your prenatal vitamins, talk with your health care provider about different options. °· Include at least 1 serving of protein with your meals and snacks. Protein options include meats or poultry, beans, nuts, eggs, and yogurt. Try eating a protein-rich snack before bed. Examples of these snacks include cheese and crackers or half of a peanut butter or turkey sandwich. °· Consider eliminating foods that trigger your symptoms. These may include spicy foods, coffee, high-fat foods, very sweet foods, and acidic foods. °· Try meals that have more protein combined with bland, salty, lower-fat, and dry foods, such as nuts, seeds, pretzels, crackers, and cereal. °· Talk with your healthcare provider about  starting a supplement of vitamin B6. °· Have fluids that are cold, clear, and carbonated or sour. Examples include lemonade, ginger ale, lemon-lime soda, ice water, and sparkling water. °· Try lemon or mint tea. °· Try brushing your teeth or using a mouth rinse after meals. °What should I avoid to reduce my symptoms? °Avoiding some of the following things may help reduce your symptoms. °· Foods with strong smells. Try eating meals in well-ventilated areas that are free of odors. °· Drinking water or other beverages with meals. Try not to drink anything during the 30 minutes before and after your meals. °· Drinking more than 1 cup of fluid at a time. Sometimes using a straw helps. °· Fried or high-fat foods, such as butter and cream sauces. °· Spicy foods. °· Skipping meals as best as you can. Nausea can be more intense on an empty stomach. If you cannot tolerate food at that time, do not force it. Try sucking on ice chips or other frozen items, and make up for missed calories later. °· Lying down within 2 hours after eating. °· Environmental triggers. These may include smoky rooms, closed spaces, rooms with strong smells, warm or humid places, overly loud and noisy rooms, and rooms with motion or flickering lights. °· Quick and sudden changes in your movement. °This information is not intended to replace advice given to you by your health care provider. Make sure you discuss any questions you have with your health care provider. °Document Released: 03/11/2007 Document Revised: 01/11/2016 Document Reviewed: 12/13/2015 °Elsevier Interactive Patient Education © 2017 [employer] ° °

## 2016-07-18 ENCOUNTER — Encounter: Payer: Medicaid Other | Admitting: Obstetrics & Gynecology

## 2016-07-18 ENCOUNTER — Ambulatory Visit (INDEPENDENT_AMBULATORY_CARE_PROVIDER_SITE_OTHER): Payer: Medicaid Other | Admitting: Obstetrics & Gynecology

## 2016-07-18 ENCOUNTER — Inpatient Hospital Stay (EMERGENCY_DEPARTMENT_HOSPITAL)
Admission: AD | Admit: 2016-07-18 | Discharge: 2016-07-19 | Disposition: A | Discharge status: Home / Self Care | Payer: Medicaid Other | Source: Ambulatory Visit | Attending: Obstetrics & Gynecology | Admitting: Obstetrics & Gynecology

## 2016-07-18 ENCOUNTER — Other Ambulatory Visit (HOSPITAL_COMMUNITY)
Admission: RE | Admit: 2016-07-18 | Discharge: 2016-07-18 | Disposition: A | Discharge status: Home / Self Care | Payer: Medicaid Other | Source: Ambulatory Visit | Attending: Obstetrics & Gynecology | Admitting: Obstetrics & Gynecology

## 2016-07-18 VITALS — BP 98/66 | HR 101 | Wt 272.0 lb

## 2016-07-18 DIAGNOSIS — K8020 Calculus of gallbladder without cholecystitis without obstruction: Secondary | ICD-10-CM | POA: Diagnosis present

## 2016-07-18 DIAGNOSIS — O30009 Twin pregnancy, unspecified number of placenta and unspecified number of amniotic sacs, unspecified trimester: Secondary | ICD-10-CM

## 2016-07-18 DIAGNOSIS — R109 Unspecified abdominal pain: Secondary | ICD-10-CM

## 2016-07-18 DIAGNOSIS — Z1151 Encounter for screening for human papillomavirus (HPV): Secondary | ICD-10-CM | POA: Diagnosis not present

## 2016-07-18 DIAGNOSIS — O26899 Other specified pregnancy related conditions, unspecified trimester: Secondary | ICD-10-CM

## 2016-07-18 DIAGNOSIS — O219 Vomiting of pregnancy, unspecified: Secondary | ICD-10-CM

## 2016-07-18 DIAGNOSIS — B977 Papillomavirus as the cause of diseases classified elsewhere: Secondary | ICD-10-CM

## 2016-07-18 DIAGNOSIS — K8012 Calculus of gallbladder with acute and chronic cholecystitis without obstruction: Secondary | ICD-10-CM

## 2016-07-18 DIAGNOSIS — K8050 Calculus of bile duct without cholangitis or cholecystitis without obstruction: Secondary | ICD-10-CM

## 2016-07-18 DIAGNOSIS — O210 Mild hyperemesis gravidarum: Secondary | ICD-10-CM

## 2016-07-18 DIAGNOSIS — O30002 Twin pregnancy, unspecified number of placenta and unspecified number of amniotic sacs, second trimester: Secondary | ICD-10-CM

## 2016-07-18 DIAGNOSIS — R1011 Right upper quadrant pain: Secondary | ICD-10-CM

## 2016-07-18 MED ORDER — LANSOPRAZOLE 15 MG PO CPDR: 15.0000 mg | DELAYED_RELEASE_CAPSULE | Freq: Every day | ORAL | 3 refills | Status: DC

## 2016-07-18 MED ORDER — DOXYLAMINE-PYRIDOXINE 10-10 MG PO TBEC: 2.0000 | DELAYED_RELEASE_TABLET | Freq: Every day | ORAL | 2 refills | Status: DC

## 2016-07-18 NOTE — MAU Note (Signed)
Pt presents stating she is having pain in her upper right abdomen that radiates to her back. States she has gall bladder problems and states this is a gall bladder attack. Took tylenol with no relief. Denies vaginal bleeding or leaking of fluid. Went to [facility] and they took blood but did not want to wait to be seen.

## 2016-07-18 NOTE — Progress Notes (Signed)
   PRENATAL VISIT NOTE  Subjective:  Traci Peterson is a 31 y.o. G2P0100 at 1730w3d being seen today for ongoing prenatal care.  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  Patient reports continued daily N/V.  Reglan not helping.  +heartbutn worsening..   Lockie Pares. Vag. Bleeding: None.   . Denies leaking of fluid.   The following portions of the patient's history were reviewed and updated as appropriate: allergies, current medications, past family history, past medical history, past social history, past surgical history and problem list. Problem list updated.  Objective:   Vitals:   07/18/16 0900  BP: 98/66  Pulse: (!) 101  Weight: 272 lb (123.4 kg)    Fetal Status:           General:  Alert, oriented and cooperative. Patient is in no acute distress.  Skin: Skin is warm and dry. No rash noted.   Cardiovascular: Normal heart rate noted  Respiratory: Normal respiratory effort, no problems with respiration noted  Abdomen: Soft, gravid, appropriate for gestational age.       Pelvic:  Cervical exam deferred        Extremities: Normal range of motion.  Edema: Trace  Mental Status: Normal mood and affect. Normal behavior. Normal judgment and thought content.   Assessment and Plan:  Pregnancy: G2P0100 at 1130w3d  1. Twin pregnancy with problem affecting pregnancy, antepartum - Anatomy US - Alpha fetoprotein, maternal  2. Hyperemesis gravidarum-WORSENING and WORSENING reflux - Add Dyclegis at night, can take  phenergan around the clock -Stop Zantac and start prevacid  3. HPV in female - Continued infection, will test for 16, 18.  If + then colpo (use large graves or snowman)  Preterm labor symptoms and general obstetric precautions including but not limited to vaginal bleeding, contractions, leaking of fluid and fetal movement were reviewed in detail with the patient. Please refer to After Visit Summary for other counseling recommendations.  Return in about 4 weeks (around 08/15/2016).   Lesly DukesKelly H Roseline Ebarb, MD

## 2016-07-19 ENCOUNTER — Encounter (HOSPITAL_COMMUNITY): Payer: Self-pay

## 2016-07-19 ENCOUNTER — Encounter (HOSPITAL_COMMUNITY): Payer: Self-pay | Admitting: Advanced Practice Midwife

## 2016-07-19 ENCOUNTER — Inpatient Hospital Stay (HOSPITAL_COMMUNITY): Payer: Medicaid Other

## 2016-07-19 ENCOUNTER — Inpatient Hospital Stay (EMERGENCY_DEPARTMENT_HOSPITAL)
Admission: AD | Admit: 2016-07-19 | Discharge: 2016-07-19 | Disposition: A | Discharge status: Home / Self Care | Payer: Medicaid Other | Source: Ambulatory Visit | Attending: Obstetrics and Gynecology | Admitting: Obstetrics and Gynecology

## 2016-07-19 DIAGNOSIS — Z833 Family history of diabetes mellitus: Secondary | ICD-10-CM | POA: Insufficient documentation

## 2016-07-19 DIAGNOSIS — Z803 Family history of malignant neoplasm of breast: Secondary | ICD-10-CM

## 2016-07-19 DIAGNOSIS — Z3A15 15 weeks gestation of pregnancy: Secondary | ICD-10-CM

## 2016-07-19 DIAGNOSIS — O26892 Other specified pregnancy related conditions, second trimester: Secondary | ICD-10-CM

## 2016-07-19 DIAGNOSIS — O99282 Endocrine, nutritional and metabolic diseases complicating pregnancy, second trimester: Secondary | ICD-10-CM

## 2016-07-19 DIAGNOSIS — O26612 Liver and biliary tract disorders in pregnancy, second trimester: Secondary | ICD-10-CM

## 2016-07-19 DIAGNOSIS — Z801 Family history of malignant neoplasm of trachea, bronchus and lung: Secondary | ICD-10-CM

## 2016-07-19 DIAGNOSIS — O30009 Twin pregnancy, unspecified number of placenta and unspecified number of amniotic sacs, unspecified trimester: Secondary | ICD-10-CM

## 2016-07-19 DIAGNOSIS — Z87891 Personal history of nicotine dependence: Secondary | ICD-10-CM

## 2016-07-19 DIAGNOSIS — R0602 Shortness of breath: Secondary | ICD-10-CM | POA: Insufficient documentation

## 2016-07-19 DIAGNOSIS — K8020 Calculus of gallbladder without cholecystitis without obstruction: Secondary | ICD-10-CM

## 2016-07-19 DIAGNOSIS — Z8249 Family history of ischemic heart disease and other diseases of the circulatory system: Secondary | ICD-10-CM

## 2016-07-19 DIAGNOSIS — Z79899 Other long term (current) drug therapy: Secondary | ICD-10-CM | POA: Insufficient documentation

## 2016-07-19 DIAGNOSIS — R109 Unspecified abdominal pain: Secondary | ICD-10-CM

## 2016-07-19 DIAGNOSIS — R1010 Upper abdominal pain, unspecified: Secondary | ICD-10-CM

## 2016-07-19 DIAGNOSIS — O99342 Other mental disorders complicating pregnancy, second trimester: Secondary | ICD-10-CM | POA: Insufficient documentation

## 2016-07-19 DIAGNOSIS — O3482 Maternal care for other abnormalities of pelvic organs, second trimester: Secondary | ICD-10-CM | POA: Insufficient documentation

## 2016-07-19 DIAGNOSIS — Z811 Family history of alcohol abuse and dependence: Secondary | ICD-10-CM

## 2016-07-19 DIAGNOSIS — R1011 Right upper quadrant pain: Secondary | ICD-10-CM

## 2016-07-19 DIAGNOSIS — K8012 Calculus of gallbladder with acute and chronic cholecystitis without obstruction: Secondary | ICD-10-CM

## 2016-07-19 DIAGNOSIS — O30002 Twin pregnancy, unspecified number of placenta and unspecified number of amniotic sacs, second trimester: Secondary | ICD-10-CM | POA: Insufficient documentation

## 2016-07-19 DIAGNOSIS — O26899 Other specified pregnancy related conditions, unspecified trimester: Secondary | ICD-10-CM

## 2016-07-19 LAB — CBC WITH DIFFERENTIAL/PLATELET
BASOS ABS: 0 10*3/uL (ref 0.0–0.1)
BASOS ABS: 0 10*3/uL (ref 0.0–0.1)
BASOS PCT: 0 %
Basophils Relative: 0 %
EOS ABS: 0 10*3/uL (ref 0.0–0.7)
EOS PCT: 0 %
Eosinophils Absolute: 0 10*3/uL (ref 0.0–0.7)
Eosinophils Relative: 0 %
HCT: 36.1 % (ref 36.0–46.0)
HCT: 36.2 % (ref 36.0–46.0)
HEMOGLOBIN: 12.7 g/dL (ref 12.0–15.0)
Hemoglobin: 12.5 g/dL (ref 12.0–15.0)
LYMPHS PCT: 10 %
Lymphocytes Relative: 10 %
Lymphs Abs: 1.3 10*3/uL (ref 0.7–4.0)
Lymphs Abs: 1.5 10*3/uL (ref 0.7–4.0)
MCH: 30 pg (ref 26.0–34.0)
MCH: 30.2 pg (ref 26.0–34.0)
MCHC: 34.5 g/dL (ref 30.0–36.0)
MCHC: 35.2 g/dL (ref 30.0–36.0)
MCV: 86 fL (ref 78.0–100.0)
MCV: 87 fL (ref 78.0–100.0)
MONO ABS: 0.4 10*3/uL (ref 0.1–1.0)
Monocytes Absolute: 0.8 10*3/uL (ref 0.1–1.0)
Monocytes Relative: 3 %
Monocytes Relative: 6 %
NEUTROS PCT: 84 %
Neutro Abs: 11.2 10*3/uL — ABNORMAL HIGH (ref 1.7–7.7)
Neutro Abs: 12.1 10*3/uL — ABNORMAL HIGH (ref 1.7–7.7)
Neutrophils Relative %: 87 %
Platelets: 210 10*3/uL (ref 150–400)
Platelets: 213 10*3/uL (ref 150–400)
RBC: 4.16 MIL/uL (ref 3.87–5.11)
RBC: 4.2 MIL/uL (ref 3.87–5.11)
RDW: 14.5 % (ref 11.5–15.5)
RDW: 14.6 % (ref 11.5–15.5)
WBC: 12.9 10*3/uL — ABNORMAL HIGH (ref 4.0–10.5)
WBC: 14.4 10*3/uL — AB (ref 4.0–10.5)

## 2016-07-19 LAB — COMPREHENSIVE METABOLIC PANEL
ALBUMIN: 3.4 g/dL — AB (ref 3.5–5.0)
ALK PHOS: 49 U/L (ref 38–126)
ALT: 16 U/L (ref 14–54)
ALT: 15 U/L (ref 14–54)
ANION GAP: 10 (ref 5–15)
AST: 19 U/L (ref 15–41)
AST: 21 U/L (ref 15–41)
Albumin: 3.6 g/dL (ref 3.5–5.0)
Alkaline Phosphatase: 49 U/L (ref 38–126)
Anion gap: 10 (ref 5–15)
BUN: <5 mg/dL — ABNORMAL LOW (ref 6–20)
CALCIUM: 8.6 mg/dL — AB (ref 8.9–10.3)
CHLORIDE: 103 mmol/L (ref 101–111)
CO2: 21 mmol/L — ABNORMAL LOW (ref 22–32)
CO2: 22 mmol/L (ref 22–32)
Calcium: 8.8 mg/dL — ABNORMAL LOW (ref 8.9–10.3)
Chloride: 102 mmol/L (ref 101–111)
Creatinine, Ser: 0.52 mg/dL (ref 0.44–1.00)
Creatinine, Ser: 0.45 mg/dL (ref 0.44–1.00)
GFR calc Af Amer: >60 mL/min (ref >60)
GFR calc Af Amer: >60 mL/min (ref >60)
GFR calc non Af Amer: >60 mL/min (ref >60)
GLUCOSE: 96 mg/dL (ref 65–99)
Glucose, Bld: 168 mg/dL — ABNORMAL HIGH (ref 65–99)
POTASSIUM: 3.5 mmol/L (ref 3.5–5.1)
Potassium: 3.6 mmol/L (ref 3.5–5.1)
SODIUM: 134 mmol/L — AB (ref 135–145)
Sodium: 134 mmol/L — ABNORMAL LOW (ref 135–145)
Total Bilirubin: 0.3 mg/dL (ref 0.3–1.2)
Total Bilirubin: 0.5 mg/dL (ref 0.3–1.2)
Total Protein: 8 g/dL (ref 6.5–8.1)
Total Protein: 7.1 g/dL (ref 6.5–8.1)

## 2016-07-19 LAB — ALPHA FETOPROTEIN, MATERNAL
AFP: 49.6 ng/mL
CURR GEST AGE: 15.4 wk
MoM for AFP: 2.19
Open Spina bifida: NEGATIVE
Osb Risk: 1:2360 {titer}

## 2016-07-19 LAB — URINALYSIS, ROUTINE W REFLEX MICROSCOPIC
BILIRUBIN URINE: NEGATIVE
HGB URINE DIPSTICK: NEGATIVE
Ketones, ur: 80 mg/dL — AB
LEUKOCYTES UA: NEGATIVE
NITRITE: NEGATIVE
PH: 6 (ref 5.0–8.0)
Protein, ur: NEGATIVE mg/dL
SPECIFIC GRAVITY, URINE: 1.021 (ref 1.005–1.030)

## 2016-07-19 LAB — AMYLASE
AMYLASE: 76 U/L (ref 28–100)
Amylase: 59 U/L (ref 28–100)

## 2016-07-19 LAB — LIPASE, BLOOD
LIPASE: 20 U/L (ref 11–51)
Lipase: 13 U/L (ref 11–51)

## 2016-07-19 MED ORDER — FAMOTIDINE 20 MG PO TABS: 40.0000 mg | ORAL_TABLET | Freq: Once | ORAL | Status: DC

## 2016-07-19 MED ORDER — PROMETHAZINE HCL 25 MG/ML IJ SOLN
25.0000 mg | Freq: Once | INTRAVENOUS | Status: AC
  Administered 2016-07-19: 25 mg via INTRAVENOUS
  Filled 2016-07-19: qty 1

## 2016-07-19 MED ORDER — OXYCODONE-ACETAMINOPHEN 5-325 MG PO TABS
2.0000 | ORAL_TABLET | Freq: Once | ORAL | Status: AC
  Administered 2016-07-19: 2 via ORAL
  Filled 2016-07-19: qty 2

## 2016-07-19 MED ORDER — HYDROMORPHONE HCL 2 MG/ML IJ SOLN
2.0000 mg | INTRAMUSCULAR | Status: DC | PRN
  Administered 2016-07-19: 2 mg via INTRAVENOUS
  Filled 2016-07-19: qty 1

## 2016-07-19 MED ORDER — METOCLOPRAMIDE HCL 10 MG PO TABS: 10.0000 mg | ORAL_TABLET | Freq: Once | ORAL | Status: DC

## 2016-07-19 MED ORDER — FAMOTIDINE 20 MG PO TABS
40.0000 mg | ORAL_TABLET | Freq: Once | ORAL | Status: AC
  Administered 2016-07-19: 40 mg via ORAL
  Filled 2016-07-19: qty 2

## 2016-07-19 MED ORDER — KETOROLAC TROMETHAMINE 30 MG/ML IJ SOLN
30.0000 mg | Freq: Once | INTRAMUSCULAR | Status: AC
  Administered 2016-07-19: 30 mg via INTRAVENOUS
  Filled 2016-07-19: qty 1

## 2016-07-19 MED ORDER — LACTATED RINGERS IV BOLUS (SEPSIS): 1000.0000 mL | Freq: Once | INTRAVENOUS | Status: DC

## 2016-07-19 MED ORDER — FAMOTIDINE IN NACL 20-0.9 MG/50ML-% IV SOLN
20.0000 mg | Freq: Once | INTRAVENOUS | Status: DC
  Filled 2016-07-19: qty 50

## 2016-07-19 MED ORDER — METOCLOPRAMIDE HCL 10 MG PO TABS
10.0000 mg | ORAL_TABLET | Freq: Once | ORAL | Status: AC
  Administered 2016-07-19: 10 mg via ORAL
  Filled 2016-07-19: qty 1

## 2016-07-19 MED ORDER — HYDROMORPHONE HCL 2 MG/ML IJ SOLN
2.0000 mg | INTRAMUSCULAR | Status: DC | PRN
  Administered 2016-07-19: 2 mg via INTRAMUSCULAR
  Filled 2016-07-19: qty 1

## 2016-07-19 MED ORDER — IBUPROFEN 600 MG PO TABS: 600.0000 mg | ORAL_TABLET | Freq: Four times a day (QID) | ORAL | 0 refills | Status: DC | PRN

## 2016-07-19 MED ORDER — HYDROMORPHONE HCL 2 MG/ML IJ SOLN
2.0000 mg | INTRAMUSCULAR | Status: DC | PRN
  Filled 2016-07-19: qty 1

## 2016-07-19 MED ORDER — DEXTROSE 5 % IN LACTATED RINGERS IV BOLUS: 1000.0000 mL | Freq: Once | INTRAVENOUS | Status: DC

## 2016-07-19 MED ORDER — METOCLOPRAMIDE HCL 5 MG/ML IJ SOLN
10.0000 mg | Freq: Once | INTRAMUSCULAR | Status: DC
  Filled 2016-07-19: qty 2

## 2016-07-19 MED ORDER — OXYCODONE-ACETAMINOPHEN 5-325 MG PO TABS: 1.0000 | ORAL_TABLET | Freq: Four times a day (QID) | ORAL | 0 refills | Status: DC | PRN

## 2016-07-19 NOTE — MAU Provider Note (Signed)
History     CSN: [id number]  Arrival date and time: 07/19/16 1631   None     Chief Complaint  Patient presents with  . Abdominal Pain   HPI    [name] is a 31 y.o. female G38P0100, Mercie Eon twins @ [redacted]w[redacted]d here in MAU with worsening symptoms.  She was seen last night with upper abdominal pain and was diagnosed with Cholelithiasis. No sonographic evidence for acute cholecystitis. No biliary dilatation. She was given percocet for pain and was told that her OB office would refer to general surgery. She did not fill her percocet and has only taken tylenol for pain. The pain is located in the center of her upper abdomen and at times radiates to the RUQ.  She was able to eat and keep down a piece of toast today without any problems.   OB History    Gravida Para Term Preterm AB Living   2 1   1    0   SAB TAB Ectopic Multiple Live Births         0        Past Medical History:  Diagnosis Date  . Anxiety    no current med.  . Chronic headaches   . Gall stones   . HPV in female   . Jaw snapping    states jaw pops if opens mouth too wide  . Menstrual bleeding problem    menses from 12/22/2012 - current (03/06/2013)  . Ovarian cyst   . PCOS (polycystic ovarian syndrome)   . Pilonidal cyst 02/2013  . Vaginal Pap smear, abnormal     Past Surgical History:  Procedure Laterality Date  . PILONIDAL CYST EXCISION N/A 03/12/2013   Procedure: CYST EXCISION PILONIDAL EXTENSIVE;  Surgeon: Shelly Rubenstein, MD;  Location: [facility];  Service: General;  Laterality: N/A;  . WISDOM TOOTH EXTRACTION      Family History  Problem Relation Age of Onset  . Hypertension Mother   . Hyperlipidemia Mother   . Diabetes Mother   . Thyroid disease Mother   . Diabetes Father   . Hypertension Father   . Alcohol abuse Father   . Breast cancer Maternal Aunt   . Lung cancer Paternal Grandfather     Social History  Substance Use Topics  . Smoking status: Former Smoker   Packs/day: 0.50    Years: 6.00    Types: Cigarettes    Quit date: 03/28/2016  . Smokeless tobacco: Former Neurosurgeon     Comment: 3-5 cig./day  . Alcohol use No     Comment: socially    Allergies: No Known Allergies  Prescriptions Prior to Admission  Medication Sig Dispense Refill Last Dose  . docusate sodium (COLACE) 100 MG capsule Take 1 capsule (100 mg total) by mouth 2 (two) times daily as needed. 30 capsule 2 Taking  . Doxylamine-Pyridoxine (DICLEGIS) 10-10 MG TBEC Take 2 tablets by mouth at bedtime. 60 tablet 2   . folic acid (FOLVITE) 800 MCG tablet Take 1 tablet (800 mcg total) by mouth daily. 30 tablet 1 Taking  . lansoprazole (PREVACID) 15 MG capsule Take 1 capsule (15 mg total) by mouth daily at 12 noon. 60 capsule 3   . metoCLOPramide (REGLAN) 10 MG tablet Take 1 tablet (10 mg total) by mouth every 6 (six) hours as needed for nausea. 30 tablet 1 Taking  . oxyCODONE-acetaminophen (PERCOCET/ROXICET) 5-325 MG tablet Take 1-2 tablets by mouth every 6 (six) hours as needed. 30  tablet 0   . polyethylene glycol powder (MIRALAX) powder Take 17 g by mouth daily. 255 g 0 Taking  . Prenatal Vit-Fe Fumarate-FA (PRENATAL MULTIVITAMIN) TABS tablet Take 1 tablet by mouth at bedtime.   Taking  . promethazine (PHENERGAN) 25 MG suppository Place 1 suppository (25 mg total) rectally every 6 (six) hours as needed for nausea or vomiting. 30 suppository 1 Taking  . ranitidine (ZANTAC) 150 MG tablet Take 1 tablet (150 mg total) by mouth 2 (two) times daily. 60 tablet 0 Taking   Results for orders placed or performed during the hospital encounter of 07/19/16 (from the past 24 hour(s))  CBC with Differential     Status: Abnormal   Collection Time: 07/19/16  5:29 PM  Result Value Ref Range   WBC 14.4 (H) 4.0 - 10.5 K/uL   RBC 4.20 3.87 - 5.11 MIL/uL   Hemoglobin 12.7 12.0 - 15.0 g/dL   HCT 16.136.1 09.636.0 - 04.546.0 %   MCV 86.0 78.0 - 100.0 fL   MCH 30.2 26.0 - 34.0 pg   MCHC 35.2 30.0 - 36.0 g/dL   RDW 40.914.6  81.111.5 - 91.415.5 %   Platelets 213 150 - 400 K/uL   Neutrophils Relative % 84 %   Neutro Abs 12.1 (H) 1.7 - 7.7 K/uL   Lymphocytes Relative 10 %   Lymphs Abs 1.5 0.7 - 4.0 K/uL   Monocytes Relative 6 %   Monocytes Absolute 0.8 0.1 - 1.0 K/uL   Eosinophils Relative 0 %   Eosinophils Absolute 0.0 0.0 - 0.7 K/uL   Basophils Relative 0 %   Basophils Absolute 0.0 0.0 - 0.1 K/uL  Comprehensive metabolic panel     Status: Abnormal   Collection Time: 07/19/16  5:29 PM  Result Value Ref Range   Sodium 134 (L) 135 - 145 mmol/L   Potassium 3.6 3.5 - 5.1 mmol/L   Chloride 102 101 - 111 mmol/L   CO2 22 22 - 32 mmol/L   Glucose, Bld 96 65 - 99 mg/dL   BUN <5 (L) 6 - 20 mg/dL   Creatinine, Ser 7.820.45 0.44 - 1.00 mg/dL   Calcium 8.6 (L) 8.9 - 10.3 mg/dL   Total Protein 7.1 6.5 - 8.1 g/dL   Albumin 3.4 (L) 3.5 - 5.0 g/dL   AST 19 15 - 41 U/L   ALT 15 14 - 54 U/L   Alkaline Phosphatase 49 38 - 126 U/L   Total Bilirubin 0.5 0.3 - 1.2 mg/dL   GFR calc non Af Amer >60 >60 mL/min   GFR calc Af Amer >60 >60 mL/min   Anion gap 10 5 - 15  Lipase, blood     Status: None   Collection Time: 07/19/16  5:29 PM  Result Value Ref Range   Lipase 13 11 - 51 U/L  Amylase     Status: None   Collection Time: 07/19/16  5:29 PM  Result Value Ref Range   Amylase 59 28 - 100 U/L    Review of Systems  Constitutional: Negative for fever.  Gastrointestinal: Positive for abdominal pain and nausea. Negative for vomiting.   Physical Exam   Blood pressure 125/79, pulse 110, temperature 99.8 F (37.7 C), resp. rate 18, height 5\' 10"  (1.778 m), weight 270 lb 12.8 oz (122.8 kg), last menstrual period 01/13/2016.  Physical Exam  Constitutional: She is oriented to person, place, and time. She appears well-developed and well-nourished. No distress.  HENT:  Head: Normocephalic.  GI: Soft. Normal appearance.  There is tenderness in the right upper quadrant and epigastric area. There is no rigidity, no rebound and no  guarding.  Neurological: She is alert and oriented to person, place, and time.  Skin: Skin is warm. She is not diaphoretic.  Psychiatric: Her behavior is normal.    MAU Course  Procedures  None  MDM + fetal heart tones   Attempted IV establishment: unable.  Reglan 10 mg PO Percocet 2 tabs given PO Pepcid PO Patient much better, eating and drinking. Denies pain at this time.  Labs stable  Assessment and Plan   A:  1. Upper abdominal pain   2. Cholelithiasis affecting pregnancy in second trimester, antepartum     P:  Discharge home in stable condition  Rx: Ibuprofen limited use Continue percocet- encouraged to fill Rx Continue Reglan and Zantac If symptoms worsen go to [facility] ED Contact OB office if you have not heard about general surgery consult.    Duane Lope, NP 07/19/2016 8:42 PM

## 2016-07-19 NOTE — Discharge Instructions (Signed)

## 2016-07-19 NOTE — Progress Notes (Signed)
Pt finished 1 pitcher of water, asking for graham crackers and PB

## 2016-07-19 NOTE — MAU Provider Note (Signed)
Chief Complaint: Abdominal Pain and Shortness of Breath   First Provider Initiated Contact with Patient 07/19/16 0026     SUBJECTIVE HPI: Traci Peterson is a 31 y.o. G2P0100 at [redacted]w[redacted]d who presents to Maternity Admissions reporting severe right upper quadrant pain radiating through to her back. Patient has previously been diagnosed with gallstones. Went to [facility] tonight for the pain, but left due to long wait and came to maternity admissions instead.   Location: Right upper quadrant Quality: Stabbing Severity: 10/10 on pain scale Duration: 3 Context: Known gallstones, pregnancy. Ate a hot dog for dinner at 5 PM Timing: Constant Modifying factors: No improvement with Tylenol. Associated signs and symptoms: Positive for nausea and vomiting 7, shortness of breath. Negative for fever, chills, diarrhea, constipation, vaginal bleeding.  Past Medical History:  Diagnosis Date  . Anxiety    no current med.  . Chronic headaches   . Gall stones   . HPV in female   . Jaw snapping    states jaw pops if opens mouth too wide  . Menstrual bleeding problem    menses from 12/22/2012 - current (03/06/2013)  . Ovarian cyst   . PCOS (polycystic ovarian syndrome)   . Pilonidal cyst 02/2013  . Vaginal Pap smear, abnormal    OB History  Gravida Para Term Preterm AB Living  2 1   1    0  SAB TAB Ectopic Multiple Live Births        0      # Outcome Date GA Lbr Len/2nd Weight Sex Delivery Anes PTL Lv  2 Current           1 Preterm 11/12/14 [redacted]w[redacted]d  13.2 oz (0.374 kg) M Vag-Spont None  FD     Past Surgical History:  Procedure Laterality Date  . PILONIDAL CYST EXCISION N/A 03/12/2013   Procedure: CYST EXCISION PILONIDAL EXTENSIVE;  Surgeon: Shelly Rubenstein, MD;  Location: [facility];  Service: General;  Laterality: N/A;  . WISDOM TOOTH EXTRACTION     Social History   Social History  . Marital status: Married    Spouse name: N/A  . Number of children:  N/A  . Years of education: N/A   Occupational History  . CMA Other    CNA   Social History Main Topics  . Smoking status: Former Smoker    Packs/day: 0.50    Years: 6.00    Types: Cigarettes    Quit date: 03/28/2016  . Smokeless tobacco: Former Neurosurgeon     Comment: 3-5 cig./day  . Alcohol use No     Comment: socially  . Drug use: No  . Sexual activity: Yes   Other Topics Concern  . Not on file   Social History Narrative  . No narrative on file   Family History  Problem Relation Age of Onset  . Hypertension Mother   . Hyperlipidemia Mother   . Diabetes Mother   . Thyroid disease Mother   . Diabetes Father   . Hypertension Father   . Alcohol abuse Father   . Breast cancer Maternal Aunt   . Lung cancer Paternal Grandfather    No current facility-administered medications on file prior to encounter.    Current Outpatient Prescriptions on File Prior to Encounter  Medication Sig Dispense Refill  . docusate sodium (COLACE) 100 MG capsule Take 1 capsule (100 mg total) by mouth 2 (two) times daily as needed. 30 capsule 2  . folic acid (FOLVITE) 800  MCG tablet Take 1 tablet (800 mcg total) by mouth daily. 30 tablet 1  . metoCLOPramide (REGLAN) 10 MG tablet Take 1 tablet (10 mg total) by mouth every 6 (six) hours as needed for nausea. 30 tablet 1  . polyethylene glycol powder (MIRALAX) powder Take 17 g by mouth daily. 255 g 0  . Prenatal Vit-Fe Fumarate-FA (PRENATAL MULTIVITAMIN) TABS tablet Take 1 tablet by mouth at bedtime.    . promethazine (PHENERGAN) 25 MG suppository Place 1 suppository (25 mg total) rectally every 6 (six) hours as needed for nausea or vomiting. 30 suppository 1  . ranitidine (ZANTAC) 150 MG tablet Take 1 tablet (150 mg total) by mouth 2 (two) times daily. 60 tablet 0   No Known Allergies  I have reviewed patient's Past Medical Hx, Surgical Hx, Family Hx, Social Hx, medications and allergies.   Review of Systems  Constitutional: Negative for appetite  change, chills and fever.  Respiratory: Positive for shortness of breath (related to pain). Negative for cough, chest tightness and wheezing.   Cardiovascular: Negative for chest pain, palpitations and leg swelling.  Gastrointestinal: Positive for abdominal pain, nausea and vomiting. Negative for constipation and diarrhea.  Genitourinary: Negative for dysuria, vaginal bleeding and vaginal discharge.  Musculoskeletal: Positive for back pain (mid back).    OBJECTIVE Patient Vitals for the past 24 hrs:  BP Temp Temp src Pulse Resp SpO2  07/19/16 0305 - - - 97 - 98 %  07/19/16 0243 - - - 106 - 98 %  07/19/16 0235 - - - 105 - 98 %  07/19/16 0230 - - - 112 - 98 %  07/19/16 0229 131/56 - - 107 - -  07/19/16 0227 (!) 124/50 - - 98 19 -  07/19/16 0226 - - - 103 - 99 %  07/19/16 0200 - - - - 20 -  07/18/16 2359 130/77 98 F (36.7 C) Oral 97 18 100 %   Constitutional: Well-developed, well-nourished, Morbidly obese female in Severe distress.  Cardiovascular: Mild tachycardia. Regular rhythm. No murmurs, rubs or gallops. Respiratory: normal rate and effort. Clear to auscultation bilaterally. No wheezing or rhonchi. GI: Abd soft, moderate right upper quadrant tenderness to palpation, Pos BS x 4 Neurologic: Alert and oriented x 4.  GU: Deferred  LAB RESULTS Results for orders placed or performed during the hospital encounter of 07/18/16 (from the past 24 hour(s))  Urinalysis, Routine w reflex microscopic     Status: Abnormal   Collection Time: 07/19/16 12:10 AM  Result Value Ref Range   Color, Urine YELLOW YELLOW   APPearance HAZY (A) CLEAR   Specific Gravity, Urine 1.021 1.005 - 1.030   pH 6.0 5.0 - 8.0   Glucose, UA >=500 (A) NEGATIVE mg/dL   Hgb urine dipstick NEGATIVE NEGATIVE   Bilirubin Urine NEGATIVE NEGATIVE   Ketones, ur 80 (A) NEGATIVE mg/dL   Protein, ur NEGATIVE NEGATIVE mg/dL   Nitrite NEGATIVE NEGATIVE   Leukocytes, UA NEGATIVE NEGATIVE   RBC / HPF 0-5 0 - 5 RBC/hpf    WBC, UA 0-5 0 - 5 WBC/hpf   Bacteria, UA RARE (A) NONE SEEN   Squamous Epithelial / LPF 6-30 (A) NONE SEEN   Mucous PRESENT   CBC with Differential/Platelet     Status: Abnormal   Collection Time: 07/19/16 12:40 AM  Result Value Ref Range   WBC 12.9 (H) 4.0 - 10.5 K/uL   RBC 4.16 3.87 - 5.11 MIL/uL   Hemoglobin 12.5 12.0 - 15.0 g/dL   HCT  36.2 36.0 - 46.0 %   MCV 87.0 78.0 - 100.0 fL   MCH 30.0 26.0 - 34.0 pg   MCHC 34.5 30.0 - 36.0 g/dL   RDW 16.1 09.6 - 04.5 %   Platelets 210 150 - 400 K/uL   Neutrophils Relative % 87 %   Neutro Abs 11.2 (H) 1.7 - 7.7 K/uL   Lymphocytes Relative 10 %   Lymphs Abs 1.3 0.7 - 4.0 K/uL   Monocytes Relative 3 %   Monocytes Absolute 0.4 0.1 - 1.0 K/uL   Eosinophils Relative 0 %   Eosinophils Absolute 0.0 0.0 - 0.7 K/uL   Basophils Relative 0 %   Basophils Absolute 0.0 0.0 - 0.1 K/uL  Comprehensive metabolic panel     Status: Abnormal   Collection Time: 07/19/16 12:40 AM  Result Value Ref Range   Sodium 134 (L) 135 - 145 mmol/L   Potassium 3.5 3.5 - 5.1 mmol/L   Chloride 103 101 - 111 mmol/L   CO2 21 (L) 22 - 32 mmol/L   Glucose, Bld 168 (H) 65 - 99 mg/dL   BUN <5 (L) 6 - 20 mg/dL   Creatinine, Ser 4.09 0.44 - 1.00 mg/dL   Calcium 8.8 (L) 8.9 - 10.3 mg/dL   Total Protein 8.0 6.5 - 8.1 g/dL   Albumin 3.6 3.5 - 5.0 g/dL   AST 21 15 - 41 U/L   ALT 16 14 - 54 U/L   Alkaline Phosphatase 49 38 - 126 U/L   Total Bilirubin 0.3 0.3 - 1.2 mg/dL   GFR calc non Af Amer >60 >60 mL/min   GFR calc Af Amer >60 >60 mL/min   Anion gap 10 5 - 15  Amylase     Status: None   Collection Time: 07/19/16 12:40 AM  Result Value Ref Range   Amylase 76 28 - 100 U/L  Lipase, blood     Status: None   Collection Time: 07/19/16 12:40 AM  Result Value Ref Range   Lipase 20 11 - 51 U/L    IMAGING US Abdomen Limited Ruq  Result Date: 07/19/2016 CLINICAL DATA:  Right upper quadrant pain EXAM: US ABDOMEN LIMITED - RIGHT UPPER QUADRANT COMPARISON:  01/16/2016  FINDINGS: Gallbladder: Multiple shadowing stones in the gallbladder. Normal wall thickness. Negative sonographic Murphy's. Common bile duct: Diameter: Normal at 4.8 mm Liver: No focal lesion identified. Within normal limits in parenchymal echogenicity. IMPRESSION: Cholelithiasis. No sonographic evidence for acute cholecystitis. No biliary dilatation. Electronically Signed   By: Jasmine Pang M.D.   On: 07/19/2016 01:11   Informal bedside ultrasound shows twin pregnancy with positive cardiac activity 2 and fetal movement 2. Dividing membrane seen.   MAU COURSE Orders Placed This Encounter  Procedures  . US Abdomen Limited RUQ  . CBC with Differential/Platelet  . Comprehensive metabolic panel  . Amylase  . Lipase, blood  . Urinalysis, Routine w reflex microscopic  . Ambulatory referral to General Surgery  . Insert peripheral IV  . Discharge patient   Meds ordered this encounter  Medications  . DISCONTD: HYDROmorphone (DILAUDID) injection 2 mg  . promethazine (PHENERGAN) 25 mg in lactated ringers 1,000 mL infusion  . DISCONTD: HYDROmorphone (DILAUDID) injection 2 mg  . HYDROmorphone (DILAUDID) injection 2 mg  . ketorolac (TORADOL) 30 MG/ML injection 30 mg  . oxyCODONE-acetaminophen (PERCOCET/ROXICET) 5-325 MG tablet    Sig: Take 1-2 tablets by mouth every 6 (six) hours as needed.    Dispense:  30 tablet    Refill:  0    Order Specific Question:   Supervising Provider    Answer:   Duane LopeEURE, LUTHER H [[id number]]   No relief of pain with Dilaudid 2 mg IM. Dilaudid 2 mg IV and Toradol 30 mg IV given.  Pain now 6/10. Patient intermittently dozing, calm, denies shortness of breath.  Discussed history, exam, labs, ultrasound with Dr. Despina HiddenEure. Recommends the patient stay nothing by mouth until 12 noon and that she be sent home with prescription for Phenergan and pain medication and outpatient surgery consult ASAP.  MDM - Symptomatic gallstones without evidence of cholecystitis or obstruction. -  Live twin IUP without evidence of pregnancy complication.  ASSESSMENT 1. Calculus of gallbladder with acute on chronic cholecystitis without obstruction   2. Twin pregnancy with problem affecting pregnancy, antepartum   3. Gall stones   4. RUQ pain   5. Abdominal pain affecting pregnancy   6. Recurrent biliary colic     PLAN Discharge home in stable conditionPer consult with Dr. Despina HiddenEure. Cholecystitis precautions Nothing by mouth until noon. Has Rx Phenergan. Rx Percocet given. Discussed diet for biliary colic. In-basket message [facility] to [facility] to arrange surgical consult ASAP Follow-up Information    [facility] [place] Follow up on 07/25/2016.   Specialty:  Obstetrics and Gynecology Why:  as scheduled Contact information: [address] [place] [id number] [phone]       [facility] Follow up.   Specialty:  Emergency Medicine Why:  as needed for [facility] Contact information: [address] [id number] mc [place] [id number] [phone]       [facility] Follow up.   Why:  in pregnancy emergencies Contact information: [address] [id number] mc [place] [id number] [phone]       [facility] Follow up.   Specialty:  General Surgery Why:  for surgery consultation Contact information: [address] [place] [id number] [phone]          Allergies as of 07/19/2016   No Known Allergies     Medication List    TAKE these medications   docusate sodium 100 MG capsule Commonly known as:  COLACE Take 1 capsule (100 mg total) by mouth 2 (two) times daily as needed.   Doxylamine-Pyridoxine 10-10 MG Tbec Commonly known as:  DICLEGIS Take 2 tablets by mouth at bedtime.   folic acid 800 MCG  tablet Commonly known as:  FOLVITE Take 1 tablet (800 mcg total) by mouth daily.   lansoprazole 15 MG capsule Commonly known as:  PREVACID Take 1 capsule (15 mg total) by mouth daily at 12 noon.   metoCLOPramide 10 MG tablet Commonly known as:  REGLAN Take 1 tablet (10 mg total) by mouth every 6 (six) hours as needed for nausea.   oxyCODONE-acetaminophen 5-325 MG tablet Commonly known as:  PERCOCET/ROXICET Take 1-2 tablets by mouth every 6 (six) hours as needed.   polyethylene glycol powder powder Commonly known as:  MIRALAX Take 17 g by mouth daily.   prenatal multivitamin Tabs tablet Take 1 tablet by mouth at bedtime.   promethazine 25 MG suppository Commonly known as:  PHENERGAN Place 1 suppository (25 mg total) rectally every 6 (six) hours as needed for nausea or vomiting.   ranitidine 150 MG tablet Commonly known as:  ZANTAC Take 1 tablet (150 mg total) by mouth 2 (two) times daily.  [place], [place] 07/19/2016  3:36 AM

## 2016-07-19 NOTE — MAU Note (Signed)
2 attempts at IV.  

## 2016-07-19 NOTE — Discharge Instructions (Signed)
Fat and Cholesterol Restricted Diet °Introduction °Getting too much fat and cholesterol in your diet may cause health problems. Following this diet helps keep your fat and cholesterol at normal levels. This can keep you from getting sick. °What types of fat should I choose? °· Choose monosaturated and polyunsaturated fats. These are found in foods such as olive oil, canola oil, flaxseeds, walnuts, almonds, and seeds. °· Eat more omega-3 fats. Good choices include salmon, mackerel, sardines, tuna, flaxseed oil, and ground flaxseeds. °· Limit saturated fats. These are in animal products such as meats, butter, and cream. They can also be in plant products such as palm oil, palm kernel oil, and coconut oil. °· Avoid foods with partially hydrogenated oils in them. These contain trans fats. Examples of foods that have trans fats are stick margarine, some tub margarines, cookies, crackers, and other baked goods. °What general guidelines do I need to follow? °· Check food labels. Look for the words "trans fat" and "saturated fat." °· When preparing a meal: °¨ Fill half of your plate with vegetables and green salads. °¨ Fill one fourth of your plate with whole grains. Look for the word "whole" as the first word in the ingredient list. °¨ Fill one fourth of your plate with lean protein foods. °· Eat more foods that have fiber, like apples, carrots, beans, peas, and barley. °· Eat more home-cooked foods. Eat less at restaurants and buffets. °· Limit or avoid alcohol. °· Limit foods high in starch and sugar. °· Limit fried foods. °· Cook foods without frying them. Baking, boiling, grilling, and broiling are all great options. °· Lose weight if you are overweight. Losing even a small amount of weight can help your overall health. It can also help prevent diseases such as diabetes and heart disease. °What foods can I eat? °Grains  °Whole grains, such as whole wheat or whole grain breads, crackers, cereals, and pasta. Unsweetened  oatmeal, bulgur, barley, quinoa, or brown rice. Corn or whole wheat flour tortillas. °Vegetables  °Fresh or frozen vegetables (raw, steamed, roasted, or grilled). Green salads. °Fruits  °All fresh, canned (in natural juice), or frozen fruits. °Meat and Other Protein Products  °Ground beef (85% or leaner), grass-fed beef, or beef trimmed of fat. Skinless chicken or turkey. Ground chicken or turkey. Pork trimmed of fat. All fish and seafood. Eggs. Dried beans, peas, or lentils. Unsalted nuts or seeds. Unsalted canned or dry beans. °Dairy  °Low-fat dairy products, such as skim or 1% milk, 2% or reduced-fat cheeses, low-fat ricotta or cottage cheese, or plain low-fat yogurt. °Fats and Oils  °Tub margarines without trans fats. Light or reduced-fat mayonnaise and salad dressings. Avocado. Olive, canola, sesame, or safflower oils. Natural peanut or almond butter (choose ones without added sugar and oil). °The items listed above may not be a complete list of recommended foods or beverages. Contact your dietitian for more options.  °What foods are not recommended? °Grains  °White bread. White pasta. White rice. Cornbread. Bagels, pastries, and croissants. Crackers that contain trans fat. °Vegetables  °White potatoes. Corn. Creamed or fried vegetables. Vegetables in a cheese sauce. °Fruits  °Dried fruits. Canned fruit in light or heavy syrup. Fruit juice. °Meat and Other Protein Products  °Fatty cuts of meat. Ribs, chicken wings, bacon, sausage, bologna, salami, chitterlings, fatback, hot dogs, bratwurst, and packaged luncheon meats. Liver and organ meats. °Dairy  °Whole or 2% milk, cream, half-and-half, and cream cheese. Whole milk cheeses. Whole-fat or sweetened yogurt. Full-fat cheeses. Nondairy creamers and   whipped toppings. Processed cheese, cheese spreads, or cheese curds. Sweets and Desserts  Corn syrup, sugars, honey, and molasses. Candy. Jam and jelly. Syrup. Sweetened cereals. Cookies, pies, cakes, donuts,  muffins, and ice cream. Fats and Oils  Butter, stick margarine, lard, shortening, ghee, or bacon fat. Coconut, palm kernel, or palm oils. Beverages  Alcohol. Sweetened drinks (such as sodas, lemonade, and fruit drinks or punches). The items listed above may not be a complete list of foods and beverages to avoid. Contact your dietitian for more information.  This information is not intended to replace advice given to you by your health care provider. Make sure you discuss any questions you have with your health care provider. Document Released: 11/13/2011 Document Revised: 01/19/2016 Document Reviewed: 08/13/2013  2017 Elsevier Cholelithiasis Cholelithiasis is also called "gallstones." It is a kind of gallbladder disease. The gallbladder is an organ that stores a liquid (bile) that helps you digest fat. Gallstones may not cause symptoms (may be silent gallstones) until they cause a blockage, and then they can cause pain (gallbladder attack). Follow these instructions at home:  Take over-the-counter and prescription medicines only as told by your doctor.  Stay at a healthy weight.  Eat healthy foods. This includes:  Eating fewer fatty foods, like fried foods.  Eating fewer refined carbs (refined carbohydrates). Refined carbs are breads and grains that are highly processed, like white bread and white rice. Instead, choose whole grains like whole-wheat bread and brown rice.  Eating more fiber. Almonds, fresh fruit, and beans are healthy sources of fiber.  Keep all follow-up visits as told by your doctor. This is important. Contact a doctor if:  You have sudden pain in the upper right side of your belly (abdomen). Pain might spread to your right shoulder or your chest. This may be a sign of a gallbladder attack.  You feel sick to your stomach (are nauseous).  You throw up (vomit).  You have been diagnosed with gallstones that have no symptoms and you get:  Belly pain.  Discomfort,  burning, or fullness in the upper part of your belly (indigestion). Get help right away if:  You have sudden pain in the upper right side of your belly, and it lasts for more than 2 hours.  You have belly pain that lasts for more than 5 hours.  You have a fever or chills.  You keep feeling sick to your stomach or you keep throwing up.  Your skin or the whites of your eyes turn yellow (jaundice).  You have dark-colored pee (urine).  You have light-colored poop (stool). Summary  Cholelithiasis is also called "gallstones."  The gallbladder is an organ that stores a liquid (bile) that helps you digest fat.  Silent gallstones are gallstones that do not cause symptoms.  A gallbladder attack may cause sudden pain in the upper right side of your belly. Pain might spread to your right shoulder or your chest. If this happens, contact your doctor.  If you have sudden pain in the upper right side of your belly that lasts for more than 2 hours, get help right away. This information is not intended to replace advice given to you by your health care provider. Make sure you discuss any questions you have with your health care provider. Document Released: 10/31/2007 Document Revised: 01/29/2016 Document Reviewed: 01/29/2016 Elsevier Interactive Patient Education  2017 [employer]

## 2016-07-19 NOTE — MAU Note (Signed)
Pt got a prescription yesterday but was not able to get it filled. Only took tylenol since yesterday.

## 2016-07-19 NOTE — Progress Notes (Signed)
Attempted IV insertion x3. alll 3 back flash and once fluid infusion veinn started to blow. IV taken out. aneshesia called.

## 2016-07-19 NOTE — MAU Note (Signed)
3rd attempt at IV by 2nd RN

## 2016-07-19 NOTE — Progress Notes (Signed)
Pt orally hydrating

## 2016-07-19 NOTE — MAU Note (Signed)
Pt reports she was here earlier today for a gallbladder attack. She was sent home. Pain has gotten worse throughout the day . Hurts to move and change positions.

## 2016-07-19 NOTE — MAU Note (Signed)
3 more attempts by 3rd RN

## 2016-07-20 ENCOUNTER — Encounter (HOSPITAL_COMMUNITY): Payer: Self-pay | Admitting: *Deleted

## 2016-07-20 ENCOUNTER — Inpatient Hospital Stay (HOSPITAL_COMMUNITY)
Admission: EM | Admit: 2016-07-20 | Discharge: 2016-07-22 | DRG: 781 | Disposition: A | Discharge status: Home / Self Care | Payer: Medicaid Other | Attending: General Surgery | Admitting: General Surgery

## 2016-07-20 DIAGNOSIS — K8020 Calculus of gallbladder without cholecystitis without obstruction: Secondary | ICD-10-CM | POA: Diagnosis not present

## 2016-07-20 DIAGNOSIS — K810 Acute cholecystitis: Secondary | ICD-10-CM | POA: Diagnosis present

## 2016-07-20 DIAGNOSIS — O99282 Endocrine, nutritional and metabolic diseases complicating pregnancy, second trimester: Secondary | ICD-10-CM | POA: Diagnosis present

## 2016-07-20 DIAGNOSIS — O30002 Twin pregnancy, unspecified number of placenta and unspecified number of amniotic sacs, second trimester: Secondary | ICD-10-CM | POA: Diagnosis present

## 2016-07-20 DIAGNOSIS — O26612 Liver and biliary tract disorders in pregnancy, second trimester: Secondary | ICD-10-CM | POA: Diagnosis present

## 2016-07-20 DIAGNOSIS — E282 Polycystic ovarian syndrome: Secondary | ICD-10-CM | POA: Diagnosis present

## 2016-07-20 DIAGNOSIS — Z87891 Personal history of nicotine dependence: Secondary | ICD-10-CM | POA: Diagnosis not present

## 2016-07-20 DIAGNOSIS — R1011 Right upper quadrant pain: Secondary | ICD-10-CM | POA: Diagnosis not present

## 2016-07-20 DIAGNOSIS — R1010 Upper abdominal pain, unspecified: Secondary | ICD-10-CM | POA: Diagnosis not present

## 2016-07-20 DIAGNOSIS — Z362 Encounter for other antenatal screening follow-up: Secondary | ICD-10-CM

## 2016-07-20 DIAGNOSIS — Z8349 Family history of other endocrine, nutritional and metabolic diseases: Secondary | ICD-10-CM | POA: Diagnosis not present

## 2016-07-20 DIAGNOSIS — O99212 Obesity complicating pregnancy, second trimester: Secondary | ICD-10-CM | POA: Diagnosis present

## 2016-07-20 DIAGNOSIS — O30009 Twin pregnancy, unspecified number of placenta and unspecified number of amniotic sacs, unspecified trimester: Secondary | ICD-10-CM | POA: Diagnosis not present

## 2016-07-20 DIAGNOSIS — K8012 Calculus of gallbladder with acute and chronic cholecystitis without obstruction: Secondary | ICD-10-CM | POA: Diagnosis present

## 2016-07-20 DIAGNOSIS — Z6841 Body Mass Index (BMI) 40.0 and over, adult: Secondary | ICD-10-CM | POA: Diagnosis not present

## 2016-07-20 DIAGNOSIS — Z3A16 16 weeks gestation of pregnancy: Secondary | ICD-10-CM

## 2016-07-20 LAB — COMPREHENSIVE METABOLIC PANEL
ALT: 14 U/L (ref 14–54)
ANION GAP: 9 (ref 5–15)
AST: 15 U/L (ref 15–41)
Albumin: 3.1 g/dL — ABNORMAL LOW (ref 3.5–5.0)
Alkaline Phosphatase: 47 U/L (ref 38–126)
BUN: <5 mg/dL — ABNORMAL LOW (ref 6–20)
CHLORIDE: 104 mmol/L (ref 101–111)
CO2: 23 mmol/L (ref 22–32)
Calcium: 8.9 mg/dL (ref 8.9–10.3)
Creatinine, Ser: 0.55 mg/dL (ref 0.44–1.00)
Glucose, Bld: 110 mg/dL — ABNORMAL HIGH (ref 65–99)
POTASSIUM: 3.9 mmol/L (ref 3.5–5.1)
Sodium: 136 mmol/L (ref 135–145)
Total Bilirubin: 0.6 mg/dL (ref 0.3–1.2)
Total Protein: 6.4 g/dL — ABNORMAL LOW (ref 6.5–8.1)

## 2016-07-20 LAB — CBC WITH DIFFERENTIAL/PLATELET
BASOS ABS: 0 10*3/uL (ref 0.0–0.1)
Basophils Relative: 0 %
EOS PCT: 0 %
Eosinophils Absolute: 0 10*3/uL (ref 0.0–0.7)
HCT: 34.8 % — ABNORMAL LOW (ref 36.0–46.0)
Hemoglobin: 11.8 g/dL — ABNORMAL LOW (ref 12.0–15.0)
LYMPHS PCT: 8 %
Lymphs Abs: 1.3 10*3/uL (ref 0.7–4.0)
MCH: 30 pg (ref 26.0–34.0)
MCHC: 33.9 g/dL (ref 30.0–36.0)
MCV: 88.5 fL (ref 78.0–100.0)
MONO ABS: 1.3 10*3/uL — AB (ref 0.1–1.0)
MONOS PCT: 9 %
Neutro Abs: 12.7 10*3/uL — ABNORMAL HIGH (ref 1.7–7.7)
Neutrophils Relative %: 83 %
PLATELETS: 206 10*3/uL (ref 150–400)
RBC: 3.93 MIL/uL (ref 3.87–5.11)
RDW: 14.3 % (ref 11.5–15.5)
WBC: 15.3 10*3/uL — ABNORMAL HIGH (ref 4.0–10.5)

## 2016-07-20 LAB — LIPASE, BLOOD: LIPASE: 14 U/L (ref 11–51)

## 2016-07-20 LAB — URINALYSIS, ROUTINE W REFLEX MICROSCOPIC
Bilirubin Urine: NEGATIVE
Glucose, UA: NEGATIVE mg/dL
Hgb urine dipstick: NEGATIVE
KETONES UR: 80 mg/dL — AB
LEUKOCYTES UA: NEGATIVE
NITRITE: NEGATIVE
PROTEIN: NEGATIVE mg/dL
Specific Gravity, Urine: 1.01 (ref 1.005–1.030)
pH: 6 (ref 5.0–8.0)

## 2016-07-20 LAB — CERVICOVAGINAL ANCILLARY ONLY: HPV: NOT DETECTED

## 2016-07-20 MED ORDER — METOCLOPRAMIDE HCL 5 MG/ML IJ SOLN
10.0000 mg | Freq: Once | INTRAMUSCULAR | Status: AC
  Administered 2016-07-20: 10 mg via INTRAVENOUS
  Filled 2016-07-20: qty 2

## 2016-07-20 MED ORDER — SODIUM CHLORIDE 0.9 % IV BOLUS (SEPSIS)
1000.0000 mL | Freq: Once | INTRAVENOUS | Status: AC
  Administered 2016-07-20: 1000 mL via INTRAVENOUS

## 2016-07-20 MED ORDER — MORPHINE SULFATE (PF) 4 MG/ML IV SOLN
4.0000 mg | Freq: Once | INTRAVENOUS | Status: AC
  Administered 2016-07-20: 4 mg via INTRAVENOUS
  Filled 2016-07-20: qty 1

## 2016-07-20 MED ORDER — ONDANSETRON HCL 4 MG/2ML IJ SOLN
4.0000 mg | Freq: Once | INTRAMUSCULAR | Status: AC
  Administered 2016-07-20: 4 mg via INTRAVENOUS
  Filled 2016-07-20: qty 2

## 2016-07-20 NOTE — ED Triage Notes (Signed)
Pt reports being seen at [facility] for n/v onset x 2 days, [facility] & was given percocet and Dilantin, pt told to come here if symptoms persist, pt A&O x4, pt reports x 3 vomiting episodes in the last 24 hrs, pt reports R sub breast pain that radiates to the R mid back

## 2016-07-20 NOTE — ED Notes (Signed)
[facility]

## 2016-07-20 NOTE — ED Provider Notes (Signed)
[facility]-EMERGENCY DEPT Provider Note   CSN: [id number] Arrival date & time: 07/20/16  1759     History   Chief Complaint Chief Complaint  Patient presents with  . Emesis    HPI Traci Peterson is a 31 y.o. female.  The history is provided by the patient.  Abdominal Pain   This is a new problem. The current episode started 2 days ago. The problem occurs constantly. Progression since onset: initially improved, now worsening. The pain is located in the RUQ and epigastric region. The quality of the pain is throbbing. The pain is at a severity of 10/10. The pain is severe. Associated symptoms include nausea and vomiting. Pertinent negatives include fever, dysuria, hematuria and arthralgias. The symptoms are aggravated by certain positions. Nothing relieves the symptoms. Past workup includes ultrasound. Her past medical history is significant for gallstones.    Past Medical History:  Diagnosis Date  . Anxiety    no current med.  . Chronic headaches   . Gall stones   . HPV in female   . Jaw snapping    states jaw pops if opens mouth too wide  . Menstrual bleeding problem    menses from 12/22/2012 - current (03/06/2013)  . Ovarian cyst   . PCOS (polycystic ovarian syndrome)   . Pilonidal cyst 02/2013  . Vaginal Pap smear, abnormal     Patient Active Problem List   Diagnosis Date Noted  . Gall stones 07/19/2016  . Hyperemesis gravidarum 06/27/2016  . Family history of breast cancer in female 06/06/2016  . Twin pregnancy with problem affecting pregnancy, antepartum 06/06/2016  . Tinea pedis, recurrent 08/08/2015  . Pernicious anemia 08/08/2015  . Morbidly obese (HCC) 10/25/2014  . Eczema 10/25/2014  . 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 Chronic headaches -  no meds 10/25/2014  . Anxiety and depression 11/30/2013  . Tobacco abuse 05/25/2013  . HPV in female 05/08/2013  . PCOS (polycystic  ovarian syndrome) 04/20/2013    Past Surgical History:  Procedure Laterality Date  . PILONIDAL CYST EXCISION N/A 03/12/2013   Procedure: CYST EXCISION PILONIDAL EXTENSIVE;  Surgeon: Shelly Rubenstein, MD;  Location: [facility];  Service: General;  Laterality: N/A;  . WISDOM TOOTH EXTRACTION      OB History    Gravida Para Term Preterm AB Living   2 1   1    0   SAB TAB Ectopic Multiple Live Births         0         Home Medications    Prior to Admission medications   Medication Sig Start Date End Date Taking? Authorizing Provider  docusate sodium (COLACE) 100 MG capsule Take 1 capsule (100 mg total) by mouth 2 (two) times daily as needed. Patient taking differently: Take 100 mg by mouth 2 (two) times daily as needed for moderate constipation.  06/06/16  Yes Lesly Dukes, MD  ibuprofen (ADVIL,MOTRIN) 600 MG tablet Take 1 tablet (600 mg total) by mouth every 6 (six) hours as needed. 07/19/16  Yes Duane Lope, NP  metoCLOPramide (REGLAN) 10 MG tablet Take 1 tablet (10 mg total) by mouth every 6 (six) hours as needed for nausea. 07/11/16 07/11/17 Yes Donette Larry, CNM  oxyCODONE-acetaminophen (PERCOCET/ROXICET) 5-325 MG tablet Take 1-2 tablets by mouth every 6 (six) hours as needed. 07/19/16  Yes Dorathy Kinsman, CNM  polyethylene glycol powder (MIRALAX) powder Take  17 g by mouth daily. Patient taking differently: Take 17 g by mouth daily as needed for moderate constipation.  06/06/16  Yes Lesly DukesKelly H Leggett, MD  Prenatal Vit-Fe Fumarate-FA (PRENATAL MULTIVITAMIN) TABS tablet Take 1 tablet by mouth every morning.    Yes Historical Provider, MD  promethazine (PHENERGAN) 25 MG suppository Place 1 suppository (25 mg total) rectally every 6 (six) hours as needed for nausea or vomiting. 07/11/16  Yes Donette LarryMelanie Bhambri, CNM  ranitidine (ZANTAC) 150 MG tablet Take 1 tablet (150 mg total) by mouth 2 (two) times daily. 06/14/16  Yes Judeth HornErin Lawrence, NP  Doxylamine-Pyridoxine (DICLEGIS)  10-10 MG TBEC Take 2 tablets by mouth at bedtime. 07/18/16   Lesly DukesKelly H Leggett, MD    Family History Family History  Problem Relation Age of Onset  . Hypertension Mother   . Hyperlipidemia Mother   . Diabetes Mother   . Thyroid disease Mother   . Diabetes Father   . Hypertension Father   . Alcohol abuse Father   . Breast cancer Maternal Aunt   . Lung cancer Paternal Grandfather     Social History Social History  Substance Use Topics  . Smoking status: Former Smoker    Packs/day: 0.50    Years: 6.00    Types: Cigarettes    Quit date: 03/28/2016  . Smokeless tobacco: Former NeurosurgeonUser     Comment: 3-5 cig./day  . Alcohol use No     Comment: socially     Allergies   Patient has no known allergies.   Review of Systems Review of Systems  Constitutional: Negative for chills and fever.  HENT: Negative for ear pain and sore throat.   Eyes: Negative for pain and visual disturbance.  Respiratory: Negative for cough and shortness of breath.   Cardiovascular: Negative for chest pain and palpitations.  Gastrointestinal: Positive for abdominal pain, nausea and vomiting.  Genitourinary: Negative for dysuria and hematuria.  Musculoskeletal: Negative for arthralgias and back pain.  Skin: Negative for color change and rash.  Neurological: Negative for seizures and syncope.  All other systems reviewed and are negative.    Physical Exam Updated Vital Signs BP 120/56   Pulse 110   Temp 99.1 F (37.3 C) (Oral)   Resp 16   Ht 5\' 2"  (1.575 m)   Wt 122.5 kg   LMP 01/13/2016   SpO2 96%   BMI 49.38 kg/m   Physical Exam  Constitutional: She appears well-developed and well-nourished. No distress.  HENT:  Head: Normocephalic and atraumatic.  Eyes: Conjunctivae are normal.  Neck: Neck supple.  Cardiovascular: Normal rate and regular rhythm.   No murmur heard. Pulmonary/Chest: Effort normal and breath sounds normal. No respiratory distress.  Abdominal: Soft. There is tenderness.  There is guarding. There is no rebound.  Gravid uterus. RUQ with significant tenderness, even to light palpation.  Musculoskeletal: She exhibits no edema, tenderness or deformity.  Neurological: She is alert.  Skin: Skin is warm and dry.  Psychiatric: She has a normal mood and affect.  Nursing note and vitals reviewed.    ED Treatments / Results  Labs (all labs ordered are listed, but only abnormal results are displayed) Labs Reviewed  CBC WITH DIFFERENTIAL/PLATELET - Abnormal; Notable for the following:       Result Value   WBC 15.3 (*)    Hemoglobin 11.8 (*)    HCT 34.8 (*)    Neutro Abs 12.7 (*)    Monocytes Absolute 1.3 (*)    All other components within  normal limits  COMPREHENSIVE METABOLIC PANEL - Abnormal; Notable for the following:    Glucose, Bld 110 (*)    BUN <5 (*)    Total Protein 6.4 (*)    Albumin 3.1 (*)    All other components within normal limits  URINALYSIS, ROUTINE W REFLEX MICROSCOPIC - Abnormal; Notable for the following:    APPearance HAZY (*)    Ketones, ur 80 (*)    All other components within normal limits  LIPASE, BLOOD    EKG  EKG Interpretation None       Radiology US Abdomen Limited Ruq  Result Date: 07/19/2016 CLINICAL DATA:  Right upper quadrant pain EXAM: US ABDOMEN LIMITED - RIGHT UPPER QUADRANT COMPARISON:  01/16/2016 FINDINGS: Gallbladder: Multiple shadowing stones in the gallbladder. Normal wall thickness. Negative sonographic Murphy's. Common bile duct: Diameter: Normal at 4.8 mm Liver: No focal lesion identified. Within normal limits in parenchymal echogenicity. IMPRESSION: Cholelithiasis. No sonographic evidence for acute cholecystitis. No biliary dilatation. Electronically Signed   By: Jasmine Pang M.D.   On: 07/19/2016 01:11    Procedures Procedures (including critical care time)  Medications Ordered in ED Medications  sodium chloride 0.9 % bolus 1,000 mL (1,000 mLs Intravenous New Bag/Given 07/20/16 1928)  morphine 4  MG/ML injection 4 mg (4 mg Intravenous Given 07/20/16 1929)  metoCLOPramide (REGLAN) injection 10 mg (10 mg Intravenous Given 07/20/16 1929)  morphine 4 MG/ML injection 4 mg (4 mg Intravenous Given 07/20/16 2204)  ondansetron (ZOFRAN) injection 4 mg (4 mg Intravenous Given 07/20/16 2320)     Initial Impression / Assessment and Plan / ED Course  I have reviewed the triage vital signs and the nursing notes.  Pertinent labs & imaging results that were available during my care of the patient were reviewed by me and considered in my medical decision making (see chart for details).    Patient is a 31 year old female, G2 P0, who is 15 weeks and 5 days with DiDi twins who presents with several days of persistent right upper quadrant pain, nausea, and vomiting. She has been seen a [facility] last 2 days with similar symptoms. She has a prior diagnosis of cholelithiasis predating this pregnancy. A formal right upper quadrant ultrasound last night showed multiple gallstones with no evidence of cholecystitis. She has been treating her pain at home with Percocet and taking Reglan. She has had ongoing vomiting today with uncontrollable pain. She denies any fevers or chills. Bedside ultrasound here also showed Cholelithiasis without evidence of cholecystitis. Her labs were repeated and showed normal LFTs, however she does have a increasing white count. UA did not show any evidence of infection. Her pain seems to be located primarily about the right upper quadrant. She was given 2 doses of morphine and antiemetics. Her pain improved mildly, but returned along with nausea and vomiting. Due to her intractable pain, general surgery was consulted and will see patient in the ED. She is tachycardic but otherwise hemodynamically stable. Disposition per surgery is pending.  Final Clinical Impressions(s) / ED Diagnoses   Final diagnoses:  Cholelithiasis affecting pregnancy in second trimester, antepartum    New  Prescriptions New Prescriptions   No medications on file     Lennette Bihari, MD 07/20/16 2321    Blane Ohara, MD 07/20/16 2324

## 2016-07-20 NOTE — H&P (Signed)
 Reason for Consult: abdominal pain Referring Physician: Zavitz, Joshua  Traci Peterson is an 30 y.o. female.  HPI: 30 yo female G2P0 at 18 wga presents with multiple days of right upper quadrant abdominal pain. She was known to have gallstones prior to the pregnancy. She has had near constant pain over the last few days. Pain is dull and deep. Worse with movement and deep breathing. She also has had persistent nausea and vomiting during the pregnancy and is taking reglan and phenergan.  Past Medical History:  Diagnosis Date  . Anxiety    no current med.  . Chronic headaches   . Gall stones   . HPV in female   . Jaw snapping    states jaw pops if opens mouth too wide  . Menstrual bleeding problem    menses from 12/22/2012 - current (03/06/2013)  . Ovarian cyst   . PCOS (polycystic ovarian syndrome)   . Pilonidal cyst 02/2013  . Vaginal Pap smear, abnormal     Past Surgical History:  Procedure Laterality Date  . PILONIDAL CYST EXCISION N/A 03/12/2013   Procedure: CYST EXCISION PILONIDAL EXTENSIVE;  Surgeon: Douglas A Blackman, MD;  Location: [facility];  Service: General;  Laterality: N/A;  . WISDOM TOOTH EXTRACTION      Family History  Problem Relation Age of Onset  . Hypertension Mother   . Hyperlipidemia Mother   . Diabetes Mother   . Thyroid disease Mother   . Diabetes Father   . Hypertension Father   . Alcohol abuse Father   . Breast cancer Maternal Aunt   . Lung cancer Paternal Grandfather     Social History:  reports that she quit smoking about 3 months ago. Her smoking use included Cigarettes. She has a 3.00 pack-year smoking history. She has quit using smokeless tobacco. She reports that she does not drink alcohol or use drugs.  Allergies: No Known Allergies  Medications: I have reviewed the patient's current medications.  Results for orders placed or performed during the hospital encounter of 07/20/16 (from the past 48 hour(s))  CBC with  Differential     Status: Abnormal   Collection Time: 07/20/16  7:22 PM  Result Value Ref Range   WBC 15.3 (H) 4.0 - 10.5 K/uL   RBC 3.93 3.87 - 5.11 MIL/uL   Hemoglobin 11.8 (L) 12.0 - 15.0 g/dL   HCT 34.8 (L) 36.0 - 46.0 %   MCV 88.5 78.0 - 100.0 fL   MCH 30.0 26.0 - 34.0 pg   MCHC 33.9 30.0 - 36.0 g/dL   RDW 14.3 11.5 - 15.5 %   Platelets 206 150 - 400 K/uL   Neutrophils Relative % 83 %   Neutro Abs 12.7 (H) 1.7 - 7.7 K/uL   Lymphocytes Relative 8 %   Lymphs Abs 1.3 0.7 - 4.0 K/uL   Monocytes Relative 9 %   Monocytes Absolute 1.3 (H) 0.1 - 1.0 K/uL   Eosinophils Relative 0 %   Eosinophils Absolute 0.0 0.0 - 0.7 K/uL   Basophils Relative 0 %   Basophils Absolute 0.0 0.0 - 0.1 K/uL  Comprehensive metabolic panel     Status: Abnormal   Collection Time: 07/20/16  7:22 PM  Result Value Ref Range   Sodium 136 135 - 145 mmol/L   Potassium 3.9 3.5 - 5.1 mmol/L   Chloride 104 101 - 111 mmol/L   CO2 23 22 - 32 mmol/L   Glucose, Bld 110 (H) 65 - 99 mg/dL     BUN <5 (L) 6 - 20 mg/dL   Creatinine, Ser 0.55 0.44 - 1.00 mg/dL   Calcium 8.9 8.9 - 10.3 mg/dL   Total Protein 6.4 (L) 6.5 - 8.1 g/dL   Albumin 3.1 (L) 3.5 - 5.0 g/dL   AST 15 15 - 41 U/L   ALT 14 14 - 54 U/L   Alkaline Phosphatase 47 38 - 126 U/L   Total Bilirubin 0.6 0.3 - 1.2 mg/dL   GFR calc non Af Amer >60 >60 mL/min   GFR calc Af Amer >60 >60 mL/min    Comment: (NOTE) The eGFR has been calculated using the CKD EPI equation. This calculation has not been validated in all clinical situations. eGFR's persistently <60 mL/min signify possible Chronic Kidney Disease.    Anion gap 9 5 - 15  Lipase, blood     Status: None   Collection Time: 07/20/16  7:22 PM  Result Value Ref Range   Lipase 14 11 - 51 U/L  Urinalysis, Routine w reflex microscopic     Status: Abnormal   Collection Time: 07/20/16  9:35 PM  Result Value Ref Range   Color, Urine YELLOW YELLOW   APPearance HAZY (A) CLEAR   Specific Gravity, Urine 1.010  1.005 - 1.030   pH 6.0 5.0 - 8.0   Glucose, UA NEGATIVE NEGATIVE mg/dL   Hgb urine dipstick NEGATIVE NEGATIVE   Bilirubin Urine NEGATIVE NEGATIVE   Ketones, ur 80 (A) NEGATIVE mg/dL   Protein, ur NEGATIVE NEGATIVE mg/dL   Nitrite NEGATIVE NEGATIVE   Leukocytes, UA NEGATIVE NEGATIVE    US Abdomen Limited Ruq  Result Date: 07/19/2016 CLINICAL DATA:  Right upper quadrant pain EXAM: US ABDOMEN LIMITED - RIGHT UPPER QUADRANT COMPARISON:  01/16/2016 FINDINGS: Gallbladder: Multiple shadowing stones in the gallbladder. Normal wall thickness. Negative sonographic Murphy's. Common bile duct: Diameter: Normal at 4.8 mm Liver: No focal lesion identified. Within normal limits in parenchymal echogenicity. IMPRESSION: Cholelithiasis. No sonographic evidence for acute cholecystitis. No biliary dilatation. Electronically Signed   By: Donavan Foil M.D.   On: 07/19/2016 01:11    Review of Systems  Constitutional: Negative for chills and fever.  HENT: Negative for hearing loss.   Eyes: Negative for blurred vision and double vision.  Respiratory: Negative for cough and hemoptysis.   Cardiovascular: Negative for chest pain and palpitations.  Gastrointestinal: Positive for abdominal pain, nausea and vomiting.  Genitourinary: Negative for dysuria and urgency.  Musculoskeletal: Negative for myalgias and neck pain.  Skin: Negative for itching and rash.  Neurological: Negative for dizziness, tingling and headaches.  Endo/Heme/Allergies: Does not bruise/bleed easily.  Psychiatric/Behavioral: Negative for depression and suicidal ideas.   Blood pressure 120/56, pulse 110, temperature 99.1 F (37.3 C), temperature source Oral, resp. rate 16, height 5' 2" (1.575 m), weight 122.5 kg (270 lb), last menstrual period 01/13/2016, SpO2 96 %. Physical Exam  Vitals reviewed. Constitutional: She is oriented to person, place, and time. She appears well-developed and well-nourished.  HENT:  Head: Normocephalic and  atraumatic.  Eyes: Conjunctivae and EOM are normal. Pupils are equal, round, and reactive to light.  Neck: Normal range of motion. Neck supple.  Cardiovascular: Normal rate and regular rhythm.   Respiratory: Effort normal and breath sounds normal.  GI: Soft. Bowel sounds are normal. She exhibits no distension. There is tenderness in the right upper quadrant. There is positive Murphy's sign.  With obesity  Musculoskeletal: Normal range of motion.  Neurological: She is alert and oriented to person, place, and time.  Skin: Skin is warm and dry.  Psychiatric: She has a normal mood and affect. Her behavior is normal.      Assessment/Plan: 31 yo female with second pregnancy presents with acute cholecystitis with stones seen on [place], escalating leukocytosis, persistent RUQ pain. -admit to floor -consult OB/GYN -abx -likely cholecystectomy while in house  [place] 07/20/2016, 11:37 PM

## 2016-07-21 ENCOUNTER — Inpatient Hospital Stay (HOSPITAL_COMMUNITY): Payer: Medicaid Other | Admitting: Anesthesiology

## 2016-07-21 ENCOUNTER — Inpatient Hospital Stay (HOSPITAL_COMMUNITY): Payer: Medicaid Other

## 2016-07-21 ENCOUNTER — Encounter (HOSPITAL_COMMUNITY): Admission: EM | Disposition: A | Discharge status: Home / Self Care | Payer: Self-pay | Source: Home / Self Care

## 2016-07-21 HISTORY — PX: CHOLECYSTECTOMY: SHX55

## 2016-07-21 LAB — SURGICAL PCR SCREEN
MRSA, PCR: NEGATIVE
STAPHYLOCOCCUS AUREUS: NEGATIVE

## 2016-07-21 SURGERY — LAPAROSCOPIC CHOLECYSTECTOMY
Anesthesia: General | Site: Abdomen

## 2016-07-21 MED ORDER — FENTANYL CITRATE (PF) 100 MCG/2ML IJ SOLN
25.0000 ug | INTRAMUSCULAR | Status: DC | PRN
  Administered 2016-07-21 (×2): 25 ug via INTRAVENOUS
  Administered 2016-07-21 (×3): 50 ug via INTRAVENOUS

## 2016-07-21 MED ORDER — SIMETHICONE 80 MG PO CHEW
40.0000 mg | CHEWABLE_TABLET | Freq: Four times a day (QID) | ORAL | Status: DC | PRN
  Administered 2016-07-22: 40 mg via ORAL
  Filled 2016-07-21: qty 1

## 2016-07-21 MED ORDER — DEXTROSE 5 % IV SOLN
2.0000 g | INTRAVENOUS | Status: DC
  Administered 2016-07-21: 10:00:00 via INTRAVENOUS
  Administered 2016-07-22: 2 g via INTRAVENOUS
  Filled 2016-07-21: qty 2

## 2016-07-21 MED ORDER — DEXTROSE 5 % IV SOLN
2.0000 g | Freq: Once | INTRAVENOUS | Status: DC
  Filled 2016-07-21: qty 2

## 2016-07-21 MED ORDER — FENTANYL CITRATE (PF) 100 MCG/2ML IJ SOLN
INTRAMUSCULAR | Status: AC
Start: 2016-07-21 — End: 2016-07-21
  Filled 2016-07-21: qty 2

## 2016-07-21 MED ORDER — ROCURONIUM BROMIDE 50 MG/5ML IV SOSY
PREFILLED_SYRINGE | INTRAVENOUS | Status: AC
  Filled 2016-07-21: qty 5

## 2016-07-21 MED ORDER — LACTATED RINGERS IV SOLN
INTRAVENOUS | Status: DC
  Administered 2016-07-21 (×3): via INTRAVENOUS

## 2016-07-21 MED ORDER — SUCCINYLCHOLINE CHLORIDE 200 MG/10ML IV SOSY
PREFILLED_SYRINGE | INTRAVENOUS | Status: AC
  Filled 2016-07-21: qty 10

## 2016-07-21 MED ORDER — ONDANSETRON HCL 4 MG/2ML IJ SOLN: 4.0000 mg | Freq: Four times a day (QID) | INTRAMUSCULAR | Status: DC | PRN

## 2016-07-21 MED ORDER — LIDOCAINE 2% (20 MG/ML) 5 ML SYRINGE
INTRAMUSCULAR | Status: AC
  Filled 2016-07-21: qty 5

## 2016-07-21 MED ORDER — ACETAMINOPHEN 325 MG PO TABS
650.0000 mg | ORAL_TABLET | Freq: Four times a day (QID) | ORAL | Status: DC | PRN
  Administered 2016-07-22: 650 mg via ORAL
  Filled 2016-07-21: qty 2

## 2016-07-21 MED ORDER — HYDROMORPHONE HCL 1 MG/ML IJ SOLN
INTRAMUSCULAR | Status: AC
  Filled 2016-07-21: qty 1

## 2016-07-21 MED ORDER — BUPIVACAINE-EPINEPHRINE (PF) 0.5% -1:200000 IJ SOLN
INTRAMUSCULAR | Status: AC
  Filled 2016-07-21: qty 30

## 2016-07-21 MED ORDER — NEOSTIGMINE METHYLSULFATE 5 MG/5ML IV SOSY
PREFILLED_SYRINGE | INTRAVENOUS | Status: AC
  Filled 2016-07-21: qty 5

## 2016-07-21 MED ORDER — 0.9 % SODIUM CHLORIDE (POUR BTL) OPTIME
TOPICAL | Status: DC | PRN
  Administered 2016-07-21: 1000 mL

## 2016-07-21 MED ORDER — FENTANYL CITRATE (PF) 100 MCG/2ML IJ SOLN
INTRAMUSCULAR | Status: AC
  Administered 2016-07-21: 25 ug via INTRAVENOUS
  Filled 2016-07-21: qty 2

## 2016-07-21 MED ORDER — ROCURONIUM BROMIDE 10 MG/ML (PF) SYRINGE
PREFILLED_SYRINGE | INTRAVENOUS | Status: DC | PRN
  Administered 2016-07-21: 50 mg via INTRAVENOUS

## 2016-07-21 MED ORDER — BUPIVACAINE HCL (PF) 0.25 % IJ SOLN
INTRAMUSCULAR | Status: AC
  Filled 2016-07-21: qty 30

## 2016-07-21 MED ORDER — DEXTROSE 5 % IV SOLN
2.0000 g | Freq: Once | INTRAVENOUS | Status: AC
  Administered 2016-07-21: 2 g via INTRAVENOUS
  Filled 2016-07-21: qty 2

## 2016-07-21 MED ORDER — PROPOFOL 10 MG/ML IV BOLUS
INTRAVENOUS | Status: AC
  Filled 2016-07-21: qty 20

## 2016-07-21 MED ORDER — HYDROCODONE-ACETAMINOPHEN 5-325 MG PO TABS
1.0000 | ORAL_TABLET | ORAL | Status: DC | PRN
Start: 2016-07-21 — End: 2016-07-22
  Administered 2016-07-21 – 2016-07-22 (×4): 2 via ORAL
  Filled 2016-07-21 (×4): qty 2

## 2016-07-21 MED ORDER — SODIUM CHLORIDE 0.9 % IR SOLN
Status: DC | PRN
  Administered 2016-07-21: 1000 mL

## 2016-07-21 MED ORDER — DEXTROSE-NACL 5-0.9 % IV SOLN
INTRAVENOUS | Status: DC
  Administered 2016-07-21 (×2): via INTRAVENOUS

## 2016-07-21 MED ORDER — ONDANSETRON 4 MG PO TBDP: 4.0000 mg | ORAL_TABLET | Freq: Four times a day (QID) | ORAL | Status: DC | PRN

## 2016-07-21 MED ORDER — MORPHINE SULFATE (PF) 2 MG/ML IV SOLN
1.0000 mg | INTRAVENOUS | Status: DC | PRN
  Administered 2016-07-21: 2 mg via INTRAVENOUS
  Filled 2016-07-21: qty 1

## 2016-07-21 MED ORDER — SUCCINYLCHOLINE CHLORIDE 20 MG/ML IJ SOLN
INTRAMUSCULAR | Status: DC | PRN
  Administered 2016-07-21: 100 mg via INTRAVENOUS

## 2016-07-21 MED ORDER — ONDANSETRON HCL 4 MG/2ML IJ SOLN
INTRAMUSCULAR | Status: AC
  Filled 2016-07-21: qty 2

## 2016-07-21 MED ORDER — OXYCODONE HCL 5 MG/5ML PO SOLN: 5.0000 mg | Freq: Once | ORAL | Status: DC | PRN

## 2016-07-21 MED ORDER — NEOSTIGMINE METHYLSULFATE 10 MG/10ML IV SOLN
INTRAVENOUS | Status: DC | PRN
  Administered 2016-07-21: 5 mg via INTRAVENOUS

## 2016-07-21 MED ORDER — PROPOFOL 10 MG/ML IV BOLUS
INTRAVENOUS | Status: DC | PRN
  Administered 2016-07-21: 20 mg via INTRAVENOUS
  Administered 2016-07-21: 200 mg via INTRAVENOUS
  Administered 2016-07-21: 20 mg via INTRAVENOUS

## 2016-07-21 MED ORDER — OXYCODONE HCL 5 MG PO TABS: 5.0000 mg | ORAL_TABLET | Freq: Once | ORAL | Status: DC | PRN

## 2016-07-21 MED ORDER — ALUM & MAG HYDROXIDE-SIMETH 200-200-20 MG/5ML PO SUSP
30.0000 mL | ORAL | Status: DC | PRN
  Administered 2016-07-21 – 2016-07-22 (×2): 30 mL via ORAL
  Filled 2016-07-21 (×2): qty 30

## 2016-07-21 MED ORDER — MORPHINE SULFATE (PF) 2 MG/ML IV SOLN
2.0000 mg | INTRAVENOUS | Status: DC | PRN
  Administered 2016-07-21 (×2): 2 mg via INTRAVENOUS
  Filled 2016-07-21 (×2): qty 1

## 2016-07-21 MED ORDER — CEFAZOLIN IN D5W 1 GM/50ML IV SOLN
INTRAVENOUS | Status: DC | PRN
  Administered 2016-07-21: 2 g via INTRAVENOUS

## 2016-07-21 MED ORDER — ACETAMINOPHEN 650 MG RE SUPP: 650.0000 mg | Freq: Four times a day (QID) | RECTAL | Status: DC | PRN

## 2016-07-21 MED ORDER — GLYCOPYRROLATE 0.2 MG/ML IJ SOLN
INTRAMUSCULAR | Status: DC | PRN
  Administered 2016-07-21: 0.4 mg via INTRAVENOUS

## 2016-07-21 MED ORDER — OXYCODONE HCL 5 MG PO TABS: 5.0000 mg | ORAL_TABLET | ORAL | Status: DC | PRN

## 2016-07-21 MED ORDER — FENTANYL CITRATE (PF) 100 MCG/2ML IJ SOLN
INTRAMUSCULAR | Status: AC
  Filled 2016-07-21: qty 2

## 2016-07-21 MED ORDER — DEXAMETHASONE SODIUM PHOSPHATE 10 MG/ML IJ SOLN
INTRAMUSCULAR | Status: AC
  Filled 2016-07-21: qty 1

## 2016-07-21 MED ORDER — HYDROMORPHONE HCL 1 MG/ML IJ SOLN
INTRAMUSCULAR | Status: DC | PRN
  Administered 2016-07-21: 0.5 mg via INTRAVENOUS

## 2016-07-21 MED ORDER — LIDOCAINE HCL (CARDIAC) 20 MG/ML IV SOLN
INTRAVENOUS | Status: DC | PRN
  Administered 2016-07-21: 80 mg via INTRATRACHEAL

## 2016-07-21 MED ORDER — BUPIVACAINE-EPINEPHRINE 0.25% -1:200000 IJ SOLN
INTRAMUSCULAR | Status: DC | PRN
Start: 2016-07-21 — End: 2016-07-21
  Administered 2016-07-21: 20 mL

## 2016-07-21 SURGICAL SUPPLY — 30 items
APPLIER CLIP 5 13 M/L LIGAMAX5 (MISCELLANEOUS) ×2
CANISTER SUCT 3000ML PPV (MISCELLANEOUS) ×2 IMPLANT
CHLORAPREP W/TINT 26ML (MISCELLANEOUS) ×2 IMPLANT
CLIP APPLIE 5 13 M/L LIGAMAX5 (MISCELLANEOUS) ×1 IMPLANT
COVER SURGICAL LIGHT HANDLE (MISCELLANEOUS) ×2 IMPLANT
DERMABOND ADVANCED (GAUZE/BANDAGES/DRESSINGS) ×1
DERMABOND ADVANCED .7 DNX12 (GAUZE/BANDAGES/DRESSINGS) ×1 IMPLANT
ELECT REM PT RETURN 9FT ADLT (ELECTROSURGICAL) ×2
ELECTRODE REM PT RTRN 9FT ADLT (ELECTROSURGICAL) ×1 IMPLANT
GLOVE SURG SIGNA 7.5 PF LTX (GLOVE) ×2 IMPLANT
GOWN STRL REUS W/ TWL LRG LVL3 (GOWN DISPOSABLE) ×2 IMPLANT
GOWN STRL REUS W/ TWL XL LVL3 (GOWN DISPOSABLE) ×1 IMPLANT
GOWN STRL REUS W/TWL LRG LVL3 (GOWN DISPOSABLE) ×2
GOWN STRL REUS W/TWL XL LVL3 (GOWN DISPOSABLE) ×1
KIT BASIN OR (CUSTOM PROCEDURE TRAY) ×2 IMPLANT
KIT ROOM TURNOVER OR (KITS) ×2 IMPLANT
NS IRRIG 1000ML POUR BTL (IV SOLUTION) ×2 IMPLANT
PAD ARMBOARD 7.5X6 YLW CONV (MISCELLANEOUS) ×2 IMPLANT
POUCH SPECIMEN RETRIEVAL 10MM (ENDOMECHANICALS) ×2 IMPLANT
SCISSORS LAP 5X35 DISP (ENDOMECHANICALS) ×2 IMPLANT
SET IRRIG TUBING LAPAROSCOPIC (IRRIGATION / IRRIGATOR) ×2 IMPLANT
SLEEVE ENDOPATH XCEL 5M (ENDOMECHANICALS) ×4 IMPLANT
SPECIMEN JAR SMALL (MISCELLANEOUS) ×2 IMPLANT
SUT MNCRL AB 4-0 PS2 18 (SUTURE) ×2 IMPLANT
TOWEL OR 17X24 6PK STRL BLUE (TOWEL DISPOSABLE) ×2 IMPLANT
TOWEL OR 17X26 10 PK STRL BLUE (TOWEL DISPOSABLE) ×2 IMPLANT
TRAY LAPAROSCOPIC MC (CUSTOM PROCEDURE TRAY) ×2 IMPLANT
TROCAR XCEL BLUNT TIP 100MML (ENDOMECHANICALS) ×2 IMPLANT
TROCAR XCEL NON-BLD 5MMX100MML (ENDOMECHANICALS) ×2 IMPLANT
TUBING INSUFFLATION (TUBING) ×2 IMPLANT

## 2016-07-21 NOTE — Anesthesia Postprocedure Evaluation (Addendum)
Anesthesia Post Note  Patient: Traci Peterson  Procedure(s) Performed: Procedure(s) (LRB): LAPAROSCOPIC CHOLECYSTECTOMY (N/A)  Patient location during evaluation: PACU Anesthesia Type: General Level of consciousness: awake and alert Pain management: pain level controlled Vital Signs Assessment: post-procedure vital signs reviewed and stable Respiratory status: spontaneous breathing, nonlabored ventilation, respiratory function stable and patient connected to nasal cannula oxygen Cardiovascular status: blood pressure returned to baseline and stable Postop Assessment: no signs of nausea or vomiting Anesthetic complications: no       Last Vitals:  Vitals:   07/21/16 1144 07/21/16 1214  BP:  133/77  Pulse:  (!) 108  Resp:  18  Temp: 37.1 C 37.2 C    Last Pain:  Vitals:   07/21/16 1356  TempSrc:   PainSc: 2                  Candida Vetter

## 2016-07-21 NOTE — Transfer of Care (Signed)
Immediate Anesthesia Transfer of Care Note  Patient: Traci Peterson  Procedure(s) Performed: Procedure(s): LAPAROSCOPIC CHOLECYSTECTOMY (N/A)  Patient Location: PACU  Anesthesia Type:General  Level of Consciousness: awake, alert , oriented and patient cooperative  Airway & Oxygen Therapy: Patient Spontanous Breathing and Patient connected to face mask oxygen  Post-op Assessment: Report given to RN and Post -op Vital signs reviewed and stable  Post vital signs: Reviewed  Last Vitals: 145/68, 104, 14, 97% Vitals:   07/21/16 0514 07/21/16 0806  BP: (!) 108/59 106/62  Pulse: (!) 111 (!) 120  Resp: 18 18  Temp: 37.2 C 37.1 C    Last Pain:  Vitals:   07/21/16 0806  TempSrc: Oral  PainSc:          Complications: No apparent anesthesia complications

## 2016-07-21 NOTE — Progress Notes (Signed)
Spoke with Careers advisersurgeon and notified patient's obgyn provider. Answering service has paged Dr. Vergie LivingPickens, who is on call.

## 2016-07-21 NOTE — Progress Notes (Signed)
Patient ID: Traci Peterson, female   DOB: 19-Sep-1985, 31 y.o.   MRN: [id number]  Pt with symptomatic cholelithiasis and [redacted] wks pregnant Lap chole has been recommended. I discussed the procedure in detail. We discussed the risks and benefits of a laparoscopic cholecystectomy including, but not limited to bleeding, infection, injury to surrounding structures such as the intestine or liver, bile leak, retained gallstones, need to convert to an open procedure, prolonged diarrhea, blood clots such as  DVT, common bile duct injury, anesthesia risks, and possible need for additional procedures.  We also discussed the possibility of preterm labor. The likelihood of improvement in symptoms and return to the patient's normal status is good. We discussed the typical post-operative recovery course.  She agrees to proceed.

## 2016-07-21 NOTE — Progress Notes (Signed)
Gave report to Boyce Mediciobbie Tadd, RN in short stay. Patient scheduled for surgery and is on her way.

## 2016-07-21 NOTE — Progress Notes (Addendum)
Three attempts to doppler fetal heart rate have been made with various dopplers. Thayer Ohmhris, RN from the emergency room was just here and could not hear any fetal heart rate. The twins are only 16 weeks. Notified Careers advisersurgeon.

## 2016-07-21 NOTE — Progress Notes (Signed)
Spoke with Dr. Vergie LivingPickens, order received.

## 2016-07-21 NOTE — Progress Notes (Signed)
Pt c/ of indigestion. Dr. Sheliah HatchKinsinger made aware and said that its ok to give maalox po.

## 2016-07-21 NOTE — Op Note (Signed)
Laparoscopic Cholecystectomy Procedure Note  Indications: This patient presents with symptomatic gallbladder disease and will undergo laparoscopic cholecystectomy.  She is [redacted] wks pregnant  Pre-operative Diagnosis: symptomatic cholelithiasis  Post-operative Diagnosis: acute cholecystitis with cholelithiasis  Surgeon: Abigail Miyamoto A   Assistants: 0  Anesthesia: General endotracheal anesthesia  ASA Class: 2  Procedure Details  The patient was seen again in the Holding Room. The risks, benefits, complications, treatment options, and expected outcomes were discussed with the patient. The possibilities of reaction to medication, pulmonary aspiration, perforation of viscus, bleeding, recurrent infection, finding a normal gallbladder, the need for additional procedures, failure to diagnose a condition, the possible need to convert to an open procedure, and creating a complication requiring transfusion or operation were discussed with the patient. The likelihood of improving the patient's symptoms with return to their baseline status is good.  The patient and/or family concurred with the proposed plan, giving informed consent. The site of surgery properly noted. The patient was taken to Operating Room, identified as Traci Peterson and the procedure verified as Laparoscopic Cholecystectomy with Intraoperative Cholangiogram. A Time Out was held and the above information confirmed.  Prior to the induction of general anesthesia, antibiotic prophylaxis was administered. General endotracheal anesthesia was then administered and tolerated well. After the induction, the abdomen was prepped with Chloraprep and draped in sterile fashion. The patient was positioned in the supine position.  Local anesthetic agent was injected into the skin above the umbilicus and an incision made. We dissected down to the abdominal fascia with blunt dissection.  The fascia was incised vertically and we entered the peritoneal  cavity bluntly.  A pursestring suture of 0-Vicryl was placed around the fascial opening.  The Hasson cannula was inserted and secured with the stay suture.  Pneumoperitoneum was then created with CO2 and tolerated well without any adverse changes in the patient's vital signs. A 5-mm port was placed in the subxiphoid position.  Two 5-mm ports were placed in the right upper quadrant. All skin incisions were infiltrated with a local anesthetic agent before making the incision and placing the trocars.  The uterus was visualized and appeared gravid consistent with pregnancy  We positioned the patient in reverse Trendelenburg, tilted slightly to the patient's left.  The gallbladder was distended and acutely inflamed.  It was covered in omentum which was bluntly dissected off.  I had to aspirate bile from the gallbladder in order to grasp it. The fundus grasped and retracted cephalad. Adhesions were lysed bluntly and with the electrocautery where indicated, taking care not to injure any adjacent organs or viscus. The infundibulum was grasped and retracted laterally, exposing the peritoneum overlying the triangle of Calot. This was then divided and exposed in a blunt fashion. The cystic duct was clearly identified and bluntly dissected circumferentially. A critical view of the cystic duct and cystic artery was obtained.  The cystic duct was then ligated with clips and divided. The cystic artery was, dissected free, ligated with clips and divided as well.   The gallbladder was dissected from the liver bed in retrograde fashion with the electrocautery. The gallbladder was removed and placed in an Endocatch sac. The liver bed was irrigated and inspected. Hemostasis was achieved with the electrocautery. Copious irrigation was utilized and was repeatedly aspirated until clear.  The gallbladder and Endocatch sac were then removed through the umbilical port site.  The pursestring suture was used to close the umbilical fascia.     We again inspected the right upper quadrant for  hemostasis.  Pneumoperitoneum was released as we removed the trocars.  4-0 Monocryl was used to close the skin.   Skin glue was then applied. The patient was then extubated and brought to the recovery room in stable condition. Instrument, sponge, and needle counts were correct at closure and at the conclusion of the case.   Findings: Cholecystitis with Cholelithiasis  Estimated Blood Loss: Minimal         Drains: 0         Specimens: Gallbladder           Complications: None; patient tolerated the procedure well.         Disposition: PACU - hemodynamically stable.         Condition: stable

## 2016-07-21 NOTE — Anesthesia Procedure Notes (Signed)
Procedure Name: Intubation Date/Time: 07/21/2016 9:29 AM Performed by: Marchelle Folks ANN Pre-anesthesia Checklist: Patient identified, Emergency Drugs available, Suction available, Patient being monitored and Timeout performed Patient Re-evaluated:Patient Re-evaluated prior to inductionOxygen Delivery Method: Circle system utilized Preoxygenation: Pre-oxygenation with 100% oxygen Intubation Type: IV induction, Rapid sequence and Cricoid Pressure applied Laryngoscope Size: Mac and 3 Grade View: Grade I Tube type: Oral Tube size: 7.0 mm Number of attempts: 1 Airway Equipment and Method: Stylet and Oral airway Placement Confirmation: ETT inserted through vocal cords under direct vision,  positive ETCO2 and breath sounds checked- equal and bilateral Secured at: 23 cm Tube secured with: Tape

## 2016-07-21 NOTE — Anesthesia Preprocedure Evaluation (Signed)
Anesthesia Evaluation  Patient identified by MRN, date of birth, ID band Patient awake    Reviewed: Allergy & Precautions, NPO status , Patient's Chart, lab work & pertinent test results  History of Anesthesia Complications Negative for: history of anesthetic complications  Airway Mallampati: II  TM Distance: >3 FB Neck ROM: Full    Dental  (+) Teeth Intact   Pulmonary former smoker,    breath sounds clear to auscultation       Cardiovascular negative cardio ROS   Rhythm:Regular     Neuro/Psych  Headaches, PSYCHIATRIC DISORDERS Anxiety    GI/Hepatic Neg liver ROS, cholecystitis   Endo/Other  Morbid obesity  Renal/GU negative Renal ROS     Musculoskeletal   Abdominal   Peds  Hematology  (+) anemia ,   Anesthesia Other Findings [redacted] weeks pregnant with twins  Reproductive/Obstetrics (+) Pregnancy                             Anesthesia Physical Anesthesia Plan  ASA: II and emergent  Anesthesia Plan: General   Post-op Pain Management:    Induction: Intravenous  Airway Management Planned: Oral ETT  Additional Equipment: None  Intra-op Plan:   Post-operative Plan: Extubation in OR  Informed Consent: I have reviewed the patients History and Physical, chart, labs and discussed the procedure including the risks, benefits and alternatives for the proposed anesthesia with the patient or authorized representative who has indicated his/her understanding and acceptance.   Dental advisory given  Plan Discussed with: CRNA and Surgeon  Anesthesia Plan Comments:         Anesthesia Quick Evaluation

## 2016-07-22 ENCOUNTER — Encounter (HOSPITAL_COMMUNITY): Payer: Self-pay | Admitting: Surgery

## 2016-07-22 MED ORDER — HYDROCODONE-ACETAMINOPHEN 5-325 MG PO TABS: 1.0000 | ORAL_TABLET | Freq: Four times a day (QID) | ORAL | 0 refills | Status: DC | PRN

## 2016-07-22 NOTE — Progress Notes (Signed)
Pt discharged to home accompanied by family.   Pt given 4 copies or work letter and copy of DC instructions.   DC instructions were all reviewed.  Follow up appointments reviewed and pt understands.  rx for Vicodin given and explained.  Pt also told she could take tylenol (to watch the total amount of tylenol per day) at home.  No further questions about home self care.

## 2016-07-22 NOTE — Progress Notes (Signed)
Talked with Radiologist and he will write addendum to US report.

## 2016-07-22 NOTE — Progress Notes (Signed)
Pt doing well, drinking, voiding, ambulating. States she has to take miralax twice daily at home.  Given Mylanta and mylicon for gas and pt is walking in the hall.  Will see how plain tylenol does for her pain.

## 2016-07-22 NOTE — Discharge Summary (Signed)
Physician Discharge Summary  Patient ID: Traci Peterson MRN: [id number] DOB/AGE: 1986-03-14 30 y.o.  Admit date: 07/20/2016 Discharge date: 07/22/2016  Admission Diagnoses:  Discharge Diagnoses:  Active Problems:   Acute cholecystitis   Discharged Condition: good  Hospital Course: 31 yo female G2P0 presented with acute cholecystitis. She underwent lap chole 2/24 and had resolution of symptoms afterward. Due to inability to find doppler she underwent US showing twin pregnancy with good heart rates in each. She is tolerating a diet and pain is controlled today  Consults: obstetrics ([place] spoken to 2/23) was consulted but never documented an evaluation  Significant Diagnostic Studies:  CBC    Component Value Date/Time   WBC 15.3 (H) 07/20/2016 1922   RBC 3.93 07/20/2016 1922   HGB 11.8 (L) 07/20/2016 1922   HCT 34.8 (L) 07/20/2016 1922   PLT 206 07/20/2016 1922   MCV 88.5 07/20/2016 1922   MCH 30.0 07/20/2016 1922   MCHC 33.9 07/20/2016 1922   RDW 14.3 07/20/2016 1922   LYMPHSABS 1.3 07/20/2016 1922   MONOABS 1.3 (H) 07/20/2016 1922   EOSABS 0.0 07/20/2016 1922   BASOSABS 0.0 07/20/2016 1922     Treatments: IV hydration  Discharge Exam: Blood pressure 118/65, pulse (!) 111, temperature 99.1 F (37.3 C), temperature source Oral, resp. rate 18, height 5\' 2"  (1.575 m), weight 122.5 kg (270 lb), last menstrual period 01/13/2016, SpO2 98 %. General appearance: alert and cooperative Head: Normocephalic, without obvious abnormality, atraumatic Neck: no adenopathy, no carotid bruit, no JVD, supple, symmetrical, trachea midline and thyroid not enlarged, symmetric, no tenderness/mass/nodules Resp: clear to auscultation bilaterally Cardio: regular rate and rhythm, S1, S2 normal, no murmur, click, rub or gallop GI: soft, non-tender; bowel sounds normal; no masses,  no organomegaly, incisions c/d/i, periumbilical incisional pain  Disposition: 01-Home or Self  Care  Discharge Instructions    Call MD for:  difficulty breathing, headache or visual disturbances    Complete by:  As directed    Call MD for:  hives    Complete by:  As directed    Call MD for:  persistant nausea and vomiting    Complete by:  As directed    Call MD for:  redness, tenderness, or signs of infection (pain, swelling, redness, odor or green/yellow discharge around incision site)    Complete by:  As directed    Call MD for:  severe uncontrolled pain    Complete by:  As directed    Call MD for:  temperature >100.4    Complete by:  As directed    Diet - low sodium heart healthy    Complete by:  As directed    Discharge wound care:    Complete by:  As directed    Ok to shower tomorrow. Glue will likely peel off in 1-3 weeks. No bandage required   Driving Restrictions    Complete by:  As directed    No driving while on narcotics   Increase activity slowly    Complete by:  As directed    Lifting restrictions    Complete by:  As directed    No lifting greater than 20 pounds for 3 weeks     Allergies as of 07/22/2016   No Known Allergies     Medication List    TAKE these medications   docusate sodium 100 MG capsule Commonly known as:  COLACE Take 1 capsule (100 mg total) by mouth 2 (two) times daily as needed. What changed:  reasons  to take this   Doxylamine-Pyridoxine 10-10 MG Tbec Commonly known as:  DICLEGIS Take 2 tablets by mouth at bedtime.   HYDROcodone-acetaminophen 5-325 MG tablet Commonly known as:  NORCO/VICODIN Take 1-2 tablets by mouth every 6 (six) hours as needed for moderate pain.   ibuprofen 600 MG tablet Commonly known as:  ADVIL,MOTRIN Take 1 tablet (600 mg total) by mouth every 6 (six) hours as needed.   metoCLOPramide 10 MG tablet Commonly known as:  REGLAN Take 1 tablet (10 mg total) by mouth every 6 (six) hours as needed for nausea.   oxyCODONE-acetaminophen 5-325 MG tablet Commonly known as:  PERCOCET/ROXICET Take 1-2 tablets  by mouth every 6 (six) hours as needed.   polyethylene glycol powder powder Commonly known as:  MIRALAX Take 17 g by mouth daily. What changed:  when to take this  reasons to take this   prenatal multivitamin Tabs tablet Take 1 tablet by mouth every morning.   promethazine 25 MG suppository Commonly known as:  PHENERGAN Place 1 suppository (25 mg total) rectally every 6 (six) hours as needed for nausea or vomiting.   ranitidine 150 MG tablet Commonly known as:  ZANTAC Take 1 tablet (150 mg total) by mouth 2 (two) times daily.      Follow-up Information    [facility] Follow up in 3 week(s).   Specialty:  General Surgery Contact information: [address] [place] [id number] [phone]          Signed: De BlanchLuke Aaron Rahaf Carbonell 07/22/2016, 11:56 AM

## 2016-07-22 NOTE — Progress Notes (Signed)
Called Radiology about US report findings. Radiologist is to call me back.

## 2016-07-25 ENCOUNTER — Ambulatory Visit (INDEPENDENT_AMBULATORY_CARE_PROVIDER_SITE_OTHER): Payer: Medicaid Other | Admitting: *Deleted

## 2016-07-25 DIAGNOSIS — O09212 Supervision of pregnancy with history of pre-term labor, second trimester: Secondary | ICD-10-CM

## 2016-07-25 DIAGNOSIS — Z8751 Personal history of pre-term labor: Secondary | ICD-10-CM

## 2016-07-25 MED ORDER — HYDROXYPROGESTERONE CAPROATE 250 MG/ML IM OIL
250.0000 mg | TOPICAL_OIL | INTRAMUSCULAR | Status: DC
  Administered 2016-07-25 – 2016-08-22 (×5): 250 mg via INTRAMUSCULAR

## 2016-08-01 ENCOUNTER — Ambulatory Visit (INDEPENDENT_AMBULATORY_CARE_PROVIDER_SITE_OTHER): Payer: Medicaid Other | Admitting: *Deleted

## 2016-08-01 DIAGNOSIS — O09212 Supervision of pregnancy with history of pre-term labor, second trimester: Secondary | ICD-10-CM | POA: Diagnosis not present

## 2016-08-01 DIAGNOSIS — Z8751 Personal history of pre-term labor: Secondary | ICD-10-CM

## 2016-08-07 ENCOUNTER — Inpatient Hospital Stay (HOSPITAL_COMMUNITY)
Admission: AD | Admit: 2016-08-07 | Discharge: 2016-08-07 | Disposition: A | Discharge status: Home / Self Care | Payer: Medicaid Other | Source: Ambulatory Visit | Attending: Family Medicine | Admitting: Family Medicine

## 2016-08-07 ENCOUNTER — Inpatient Hospital Stay (HOSPITAL_COMMUNITY): Payer: Medicaid Other

## 2016-08-07 ENCOUNTER — Encounter (HOSPITAL_COMMUNITY): Payer: Self-pay | Admitting: Student

## 2016-08-07 DIAGNOSIS — Z8249 Family history of ischemic heart disease and other diseases of the circulatory system: Secondary | ICD-10-CM | POA: Diagnosis not present

## 2016-08-07 DIAGNOSIS — O3482 Maternal care for other abnormalities of pelvic organs, second trimester: Secondary | ICD-10-CM | POA: Diagnosis not present

## 2016-08-07 DIAGNOSIS — O42112 Preterm premature rupture of membranes, onset of labor more than 24 hours following rupture, second trimester: Secondary | ICD-10-CM | POA: Diagnosis not present

## 2016-08-07 DIAGNOSIS — F419 Anxiety disorder, unspecified: Secondary | ICD-10-CM | POA: Diagnosis not present

## 2016-08-07 DIAGNOSIS — Z87891 Personal history of nicotine dependence: Secondary | ICD-10-CM | POA: Diagnosis not present

## 2016-08-07 DIAGNOSIS — Z9049 Acquired absence of other specified parts of digestive tract: Secondary | ICD-10-CM | POA: Insufficient documentation

## 2016-08-07 DIAGNOSIS — Z801 Family history of malignant neoplasm of trachea, bronchus and lung: Secondary | ICD-10-CM | POA: Insufficient documentation

## 2016-08-07 DIAGNOSIS — Z811 Family history of alcohol abuse and dependence: Secondary | ICD-10-CM | POA: Diagnosis not present

## 2016-08-07 DIAGNOSIS — Z79899 Other long term (current) drug therapy: Secondary | ICD-10-CM | POA: Insufficient documentation

## 2016-08-07 DIAGNOSIS — O99342 Other mental disorders complicating pregnancy, second trimester: Secondary | ICD-10-CM | POA: Insufficient documentation

## 2016-08-07 DIAGNOSIS — Z9889 Other specified postprocedural states: Secondary | ICD-10-CM | POA: Insufficient documentation

## 2016-08-07 DIAGNOSIS — N83209 Unspecified ovarian cyst, unspecified side: Secondary | ICD-10-CM | POA: Diagnosis not present

## 2016-08-07 DIAGNOSIS — O30042 Twin pregnancy, dichorionic/diamniotic, second trimester: Secondary | ICD-10-CM | POA: Insufficient documentation

## 2016-08-07 DIAGNOSIS — O09212 Supervision of pregnancy with history of pre-term labor, second trimester: Secondary | ICD-10-CM | POA: Insufficient documentation

## 2016-08-07 DIAGNOSIS — Z803 Family history of malignant neoplasm of breast: Secondary | ICD-10-CM | POA: Diagnosis not present

## 2016-08-07 DIAGNOSIS — O4692 Antepartum hemorrhage, unspecified, second trimester: Secondary | ICD-10-CM

## 2016-08-07 DIAGNOSIS — Z3A17 17 weeks gestation of pregnancy: Secondary | ICD-10-CM | POA: Diagnosis not present

## 2016-08-07 DIAGNOSIS — O30009 Twin pregnancy, unspecified number of placenta and unspecified number of amniotic sacs, unspecified trimester: Secondary | ICD-10-CM | POA: Diagnosis not present

## 2016-08-07 DIAGNOSIS — O99282 Endocrine, nutritional and metabolic diseases complicating pregnancy, second trimester: Secondary | ICD-10-CM | POA: Insufficient documentation

## 2016-08-07 DIAGNOSIS — O99212 Obesity complicating pregnancy, second trimester: Secondary | ICD-10-CM | POA: Diagnosis not present

## 2016-08-07 DIAGNOSIS — Z833 Family history of diabetes mellitus: Secondary | ICD-10-CM | POA: Diagnosis not present

## 2016-08-07 DIAGNOSIS — O4290 Premature rupture of membranes, unspecified as to length of time between rupture and onset of labor, unspecified weeks of gestation: Secondary | ICD-10-CM

## 2016-08-07 LAB — WET PREP, GENITAL
Clue Cells Wet Prep HPF POC: NONE SEEN
Sperm: NONE SEEN
Trich, Wet Prep: NONE SEEN
Yeast Wet Prep HPF POC: NONE SEEN

## 2016-08-07 LAB — POCT FERN TEST: POCT FERN TEST: POSITIVE

## 2016-08-07 LAB — OB RESULTS CONSOLE GC/CHLAMYDIA: Gonorrhea: NEGATIVE

## 2016-08-07 MED ORDER — AZITHROMYCIN 250 MG PO TABS
1000.0000 mg | ORAL_TABLET | Freq: Once | ORAL | Status: AC
  Administered 2016-08-07: 1000 mg via ORAL
  Filled 2016-08-07: qty 4

## 2016-08-07 MED ORDER — AZITHROMYCIN 250 MG PO TABS: 1000.0000 mg | ORAL_TABLET | Freq: Once | ORAL | 0 refills | Status: DC

## 2016-08-07 MED ORDER — AMOXICILLIN 500 MG PO CAPS: 500.0000 mg | ORAL_CAPSULE | Freq: Three times a day (TID) | ORAL | 0 refills | Status: DC

## 2016-08-07 MED ORDER — AZITHROMYCIN 250 MG PO TABS: 1000.0000 mg | ORAL_TABLET | Freq: Once | ORAL | 0 refills | Status: AC

## 2016-08-07 NOTE — MAU Provider Note (Signed)
Patient Name: Traci Peterson, female   DOB: Jan 26, 1986, 31 y.o.  MRN: [id number]  Diagnosis discussed with patient and her husband, appropriate support given to her.  Patient given options of expectant management or proceeding with delivery (IOL vs D&E); she is leaning towards expectant management for now.  Discussed risks of increased risks of 50% risk of infection (chorioamnionitis and endometritis, greatest between days 2-5) with 10% risk of sepsis, 40-50% chance of abruptio placentae, 1-2% risk of cord prolapse, 10% risk of fetal death, risk of preterm birth, and need for cesarean delivery. Also emphasized that the neonate is at risk for morbidity/mortality related to preterm birth, infection, pulmonary hypoplasia, and musculoskeletal deformation.  She was told that if she makes it to 23 weeks, she will be admitted for inpatient observation and steroids; none of that is indicated for now. I also discussed timing of antibiotics - that some providers would offer antibiotics now vs at viability. I discussed with her that there is not any data regarding administration of antibiotics now, but that successive courses of antibiotics would not be given as studies show that doing so does not change outcomes. Patient does elect to have course of antibiotics now. She was told to call for any signs/symptoms of infection or bleeding; chorioamnionitis or abruption are indications for delivery. She was discharged to home; will follow up in clinic next week.  Traci HeritageJacob J Amyah Clawson, DO 08/07/2016 5:49 PM

## 2016-08-07 NOTE — MAU Note (Signed)
Chaplain Katie in to see pt.

## 2016-08-07 NOTE — MAU Note (Signed)
Dr Adrian BlackwaterStinson in to discuss plan of care with pt. Pt voided on yellow pad and pad changed

## 2016-08-07 NOTE — MAU Provider Note (Signed)
History     CSN: [id number]  Arrival date and time: 08/07/16 1304   First Provider Initiated Contact with Patient 08/07/16 1339      Chief Complaint  Patient presents with  . Vaginal Bleeding   HPI Traci Peterson is a 31 y.o. G2P0100 at 100w6d by di/di twins who presents with vaginal bleeding. Reports pink spotting on toilet paper & in underwear since this morning. Also has had constant lower abdominal pressure since last night. Denies abdominal pain, back pain, n/v/d, constipation, dysuria, or recent intercourse. Is currently using OTC yeast infection medication; last place last night. Feels suprapubic discomfort when she needs to void & thinks she may have a UTI.   OB History    Gravida Para Term Preterm AB Living   2 1   1    0   SAB TAB Ectopic Multiple Live Births         0        Past Medical History:  Diagnosis Date  . Anxiety    no current med.  . Chronic headaches   . Gall stones   . HPV in female   . Jaw snapping    states jaw pops if opens mouth too wide  . Ovarian cyst   . PCOS (polycystic ovarian syndrome)   . Pilonidal cyst 02/2013  . Vaginal Pap smear, abnormal     Past Surgical History:  Procedure Laterality Date  . CHOLECYSTECTOMY N/A 07/21/2016   Procedure: LAPAROSCOPIC CHOLECYSTECTOMY;  Surgeon: Abigail Miyamoto, MD;  Location: [facility] OR;  Service: General;  Laterality: N/A;  . PILONIDAL CYST EXCISION N/A 03/12/2013   Procedure: CYST EXCISION PILONIDAL EXTENSIVE;  Surgeon: Shelly Rubenstein, MD;  Location: [facility];  Service: General;  Laterality: N/A;  . WISDOM TOOTH EXTRACTION      Family History  Problem Relation Age of Onset  . Hypertension Mother   . Hyperlipidemia Mother   . Diabetes Mother   . Thyroid disease Mother   . Diabetes Father   . Hypertension Father   . Alcohol abuse Father   . Breast cancer Maternal Aunt   . Lung cancer Paternal Grandfather     Social History  Substance Use Topics  . Smoking status:  Former Smoker    Packs/day: 0.50    Years: 6.00    Types: Cigarettes    Quit date: 03/28/2016  . Smokeless tobacco: Former Neurosurgeon     Comment: 3-5 cig./day  . Alcohol use No     Comment: socially    Allergies: No Known Allergies  Facility-Administered Medications Prior to Admission  Medication Dose Route Frequency Provider Last Rate Last Dose  . hydroxyprogesterone caproate (MAKENA) 250 mg/mL injection 250 mg  250 mg Intramuscular Weekly Lesly Dukes, MD   250 mg at 08/01/16 1240   Prescriptions Prior to Admission  Medication Sig Dispense Refill Last Dose  . docusate sodium (COLACE) 100 MG capsule Take 1 capsule (100 mg total) by mouth 2 (two) times daily as needed. (Patient taking differently: Take 100 mg by mouth 2 (two) times daily as needed for moderate constipation. ) 30 capsule 2 Past Week at Unknown time  . Doxylamine-Pyridoxine (DICLEGIS) 10-10 MG TBEC Take 2 tablets by mouth at bedtime. 60 tablet 2 08/06/2016 at Unknown time  . Prenatal Vit-Fe Fumarate-FA (PRENATAL MULTIVITAMIN) TABS tablet Take 1 tablet by mouth every morning.    08/06/2016 at Unknown time  . promethazine (PHENERGAN) 25 MG suppository Place 1 suppository (25 mg total) rectally  every 6 (six) hours as needed for nausea or vomiting. 30 suppository 1 Past Week at Unknown time  . ranitidine (ZANTAC) 150 MG tablet Take 1 tablet (150 mg total) by mouth 2 (two) times daily. 60 tablet 0 08/06/2016 at Unknown time  . HYDROcodone-acetaminophen (NORCO/VICODIN) 5-325 MG tablet Take 1-2 tablets by mouth every 6 (six) hours as needed for moderate pain. 30 tablet 0   . ibuprofen (ADVIL,MOTRIN) 600 MG tablet Take 1 tablet (600 mg total) by mouth every 6 (six) hours as needed. 10 tablet 0 07/20/2016 at Unknown time  . metoCLOPramide (REGLAN) 10 MG tablet Take 1 tablet (10 mg total) by mouth every 6 (six) hours as needed for nausea. 30 tablet 1 07/20/2016 at Unknown time  . oxyCODONE-acetaminophen (PERCOCET/ROXICET) 5-325 MG tablet Take  1-2 tablets by mouth every 6 (six) hours as needed. 30 tablet 0 07/20/2016 at Unknown time  . polyethylene glycol powder (MIRALAX) powder Take 17 g by mouth daily. (Patient taking differently: Take 17 g by mouth daily as needed for moderate constipation. ) 255 g 0 07/20/2016 at Unknown time    Review of Systems  Constitutional: Negative.   Gastrointestinal: Negative.   Genitourinary: Positive for vaginal bleeding and vaginal discharge. Negative for dysuria and hematuria.   Physical Exam   Blood pressure (!) 108/42, pulse 102, temperature 98 F (36.7 C), resp. rate 20, weight 271 lb 8 oz (123.2 kg), last menstrual period 01/13/2016, SpO2 100 %.  Physical Exam  Nursing note and vitals reviewed. Constitutional: She is oriented to person, place, and time. She appears well-developed and well-nourished. No distress.  HENT:  Head: Normocephalic and atraumatic.  Eyes: Conjunctivae are normal. Right eye exhibits no discharge. Left eye exhibits no discharge. No scleral icterus.  Neck: Normal range of motion.  Respiratory: Effort normal. No respiratory distress.  GI: Soft. There is no tenderness.  Genitourinary: No bleeding in the vagina.  Genitourinary Comments: Immediately prior to speculum exam patient began leaking moderate amount of yellow fluid containing brown specks. SSE performed, + pooling. Unable to visualize cervix d/t amount of fluid. Digital exam deferred d/t PPROM.   Neurological: She is alert and oriented to person, place, and time.  Skin: Skin is warm and dry. She is not diaphoretic.  Psychiatric: She has a normal mood and affect. Her behavior is normal. Judgment and thought content normal.    MAU Course  Procedures Results for orders placed or performed during the hospital encounter of 08/07/16 (from the past 24 hour(s))  Wet prep, genital     Status: Abnormal   Collection Time: 08/07/16  2:00 PM  Result Value Ref Range   Yeast Wet Prep HPF POC NONE SEEN NONE SEEN   Trich,  Wet Prep NONE SEEN NONE SEEN   Clue Cells Wet Prep HPF POC NONE SEEN NONE SEEN   WBC, Wet Prep HPF POC FEW (A) NONE SEEN   Sperm NONE SEEN   POCT fern test     Status: None   Collection Time: 08/07/16  2:09 PM  Result Value Ref Range   POCT Fern Test Positive = ruptured amniotic membanes    [place] Mfm Ob Limited  Result Date: 08/07/2016 ----------------------------------------------------------------------  OBSTETRICS REPORT                      (Signed Final 08/07/2016 04:20 pm) ---------------------------------------------------------------------- Patient Info  ID #:       [id number]  D.O.B.:  11-15-1985 (30 yrs)  Name:       Traci Peterson               Visit Date: 08/07/2016 03:56 pm ---------------------------------------------------------------------- Performed By  Performed By:     Emeline Darling BS,      Ref. Address:     [address]                                                             [place], [place]                                                             [address]  Attending:        Charlsie Merles MD         Secondary Phy.:   MAU Nursing-                                                             MAU/Triage  Referred By:      Fredrich Romans                Location:         [facility]                    LEGGETT MD ---------------------------------------------------------------------- Orders   #  Description                                 Code   1  [place] MFM OB LIMITED                           [phone]  ----------------------------------------------------------------------   #  Ordered By               Order #        Accession #    Episode #   1  Judeth Horn            [id number]      [id number]     [id number]  ---------------------------------------------------------------------- Indications   [redacted] weeks gestation of pregnancy                Z3A.17   Obesity complicating pregnancy,  first          O99.211   trimester  Poor obstetric history: Previous preterm       O09.219   delivery, antepartum (21 weeks)   Twin pregnancy, di/di, first trimester         O30.041   Premature rupture of membranes - leaking       O42.90   fluid   Vaginal bleeding in pregnancy, second          O46.92   trimester  ---------------------------------------------------------------------- OB History  Blood Type:            Height:  5'2"   Weight (lb):  270       BMI:  49.38  Gravidity:    2         Prem:   1  Living:       0 ---------------------------------------------------------------------- Fetal Evaluation (Fetus A)  Num Of Fetuses:     2  Fetal Heart         161  Rate(bpm):  Cardiac Activity:   Observed  Fetal Lie:          Lower Fetus  Presentation:       Cephalic  Placenta:           Posterior, above cervical os  Amniotic Fluid  AFI FV:      Anhydramnios ---------------------------------------------------------------------- Gestational Age (Fetus A)  Best:          17w 6d     Det. By:  Previous Ultrasound      EDD:   01/09/17                                      (05/30/16) ---------------------------------------------------------------------- Fetal Evaluation (Fetus B)  Num Of Fetuses:     2  Fetal Heart         170  Rate(bpm):  Cardiac Activity:   Observed  Fetal Lie:          Upper Fetus  Presentation:       Cephalic  Placenta:           Posterior, above cervical os  Amniotic Fluid  AFI FV:      Subjectively within normal limits                              Largest Pocket(cm)                              3.9 ---------------------------------------------------------------------- Gestational Age (Fetus B)  Best:          17w 6d     Det. By:  Previous Ultrasound      EDD:   01/09/17                                      (05/30/16) ---------------------------------------------------------------------- Cervix Uterus Adnexa  Cervix  Length:            3.4  cm.  Normal appearance by transabdominal scan.  ---------------------------------------------------------------------- Impression  Diamniotic Dichorionic intrauterine pregnancy at 17+6 weeks  with vaginal bleeding  Presentation is cephalic/cephalic  Normal amniotic fluid in sac of twin B, with  MVP of 3.9 cm.  Anhydramnios of twin A  normal fetal cardiac activity both twins ---------------------------------------------------------------------- Recommendations  Probable PPROM of twin A  continue clinical evaluation and management ----------------------------------------------------------------------                 Charlsie MerlesMark Newman, MD Electronically Signed Final Report   08/07/2016 04:20 pm ----------------------------------------------------------------------    MDM FHT 169 & 173 Wet prep & GC/CT + pooling & + fern Ultrasound -- anhydramnios of baby A. Baby B has subjectively normal fluid. Cervical length 3.4 cm & appears closed Pt afebrile Discussed patient with Dr. Adrian BlackwaterStinson. Dr. Adrian BlackwaterStinson at bedside discussing results and plan of care with patient. Will discharge home on oral abx. Pt to continue 17-P & keep appts, including anatomy scan scheduled tomorrow.  Chaplain called to bedside to speak with patient.  Assessment and Plan  A: 1. Preterm premature rupture of membranes (PPROM) with onset of labor after 24 hours of rupture in second trimester, antepartum   2. [redacted] weeks gestation of pregnancy   3. Twin pregnancy   4. Vaginal bleeding in pregnancy, second trimester   5. Amniotic fluid leaking    P: Discharge home Rx amoxicillin & azythromycin Pelvic rest Discussed reasons to return to MAU including s/s infection or PTL Keep follow up appointment with OB/PCP & MFM GC/CT pending   Judeth Hornrin Sidney Kann 08/07/2016, 1:37 PM

## 2016-08-07 NOTE — MAU Note (Signed)
Unable to void at this time.

## 2016-08-07 NOTE — Progress Notes (Signed)
I first met Beckie Busing and Elenore Rota when she had a 21 week loss in 2016; both remembered meeting with me.   I spent time with them again today, offering support as she waited for more information from the ultrasound.  We shared prayer together and I offered some meditation techniques since she was trying hard to remain calm.    Chaplain Janne Napoleon, Bcc Pager, [phone] 5:08 PM    08/07/16 1700  Clinical Encounter Type  Visited With Patient and family together  Visit Type Spiritual support  Referral From Nurse  Spiritual Encounters  Spiritual Needs Prayer;Emotional

## 2016-08-07 NOTE — MAU Note (Signed)
Having some cramping and throbbing in lower abd.  Feeling pelvic pressure.  Noted some pink when wiped after using restroom x2., more the 2nd time. Is currently on vag cream for yeast inf.  Is feeling the babies move.

## 2016-08-07 NOTE — Discharge Instructions (Signed)
Pelvic Rest Pelvic rest may be recommended if:  Your placenta is partially or completely covering the opening of your cervix (placenta previa).  There is bleeding between the wall of the uterus and the amniotic sac in the first trimester of pregnancy (subchorionic hemorrhage).  You went into labor too early (preterm labor). Based on your overall health and the health of your baby, your health care provider will decide if pelvic rest is right for you. How do I rest my pelvis? For as long as told by your health care provider:  Do not have sex, sexual stimulation, or an orgasm.  Do not use tampons. Do not douche. Do not put anything in your vagina.  Do not lift anything that is heavier than 10 lb (4.5 kg).  Avoid activities that take a lot of effort (are strenuous).  Avoid any activity in which your pelvic muscles could become strained. When should I seek medical care? Seek medical care if you have:  Cramping pain in your lower abdomen.  Vaginal discharge.  A low, dull backache.  Regular contractions.  Uterine tightening. When should I seek immediate medical care? Seek immediate medical care if:  You have vaginal bleeding and you are pregnant. This information is not intended to replace advice given to you by your health care provider. Make sure you discuss any questions you have with your health care provider. Document Released: 09/08/2010 Document Revised: 10/20/2015 Document Reviewed: 11/15/2014 Elsevier Interactive Patient Education  2017 Elsevier Inc.   Premature Rupture and Preterm Premature Rupture of Membranes Rupture of membranes is when the membranes (amniotic sac) that hold your baby break open. This is commonly referred to as your "water breaking." If your water breaks before labor starts (prematurely), it is called premature rupture of membranes (PROM). If PROM occurs before 37 weeks of pregnancy, it is called preterm premature rupture of membranes (PPROM).  Because the amniotic sac keeps infection out and performs other important functions, having the amniotic sac rupture before 37 weeks of pregnancy can lead to serious problems. It requires immediate attention from a health care provider. What are the causes? When PROM occurs at 37 weeks of pregnancy or later, it is usually caused by natural weakening of the membranes and friction caused by contractions. PPROM is usually caused by infection. In many cases, the cause is not known. What increases the risk of PPROM? The following factors may make you more likely to have PPROM:  Infection.  Having had PPROM in a previous pregnancy.  Short cervical length.  Bleeding during the second or third trimester.  Low BMI, which is an estimate of body fat.  Smoking.  Using drugs.  Low socioeconomic status. What problems can be caused by PROM and PPROM? This condition creates health dangers for the mother and the baby. These include:  Delivering a premature baby.  Getting a serious infection of the placental tissues (chorioamnionitis).  Early detachment of the placenta from the uterus (placental abruption).  Compression of the umbilical cord.  Developing a serious infection after delivery. What are the signs of PROM and PPROM? Signs of this condition include:  A sudden gush or slow leaking of fluid from the vagina.  Constant wet underwear. Sometimes, women mistake the leaking or wetness for urine, especially if the leak is slow and not a gush of fluid. If there is constant leaking or if your underwear continues to get wet, your membranes have likely ruptured. What should I do if I think my membranes have ruptured?  Call your health care provider right away.  You will need to go to the hospital immediately to be checked by a health care provider. What happens if I am diagnosed with PROM or PPROM? Once you arrive at the hospital, you will have tests done. A cervical exam will be done using  a lubricated instrument (speculum) to check whether the cervix has softened or started to open (dilate).  If you are diagnosed with PROM, your labor may be started for you (you may be induced) within 24 hours if you are not having contractions.  If you are diagnosed with PPROM and you are not having contractions, you may be induced depending on your trimester. If you have PPROM:  You and your baby will be monitored closely for signs of infection or other complications.  You may be given:  An antibiotic medicine to lower the chances of developing an infection.  A steroid medicine to help mature the baby's lungs more quickly.  A medicine to help prevent cerebral palsy in your baby.  A medicine to stop preterm labor.  You may be ordered to be on bed rest at home or in the hospital.  You may be induced if complications occur for you or the baby. Your treatment will depend on many factors, such as how many weeks you have been pregnant (how far along you are), the development of the baby, and other complications that may occur. This information is not intended to replace advice given to you by your health care provider. Make sure you discuss any questions you have with your health care provider. Document Released: 05/14/2005 Document Revised: 01/11/2016 Document Reviewed: 12/19/2015 Elsevier Interactive Patient Education  2017 [employer]

## 2016-08-08 ENCOUNTER — Other Ambulatory Visit (HOSPITAL_COMMUNITY): Payer: Self-pay | Admitting: Maternal and Fetal Medicine

## 2016-08-08 ENCOUNTER — Ambulatory Visit (HOSPITAL_COMMUNITY)
Admission: RE | Admit: 2016-08-08 | Discharge: 2016-08-08 | Disposition: A | Discharge status: Home / Self Care | Payer: Medicaid Other | Source: Ambulatory Visit | Attending: Obstetrics & Gynecology | Admitting: Obstetrics & Gynecology

## 2016-08-08 ENCOUNTER — Ambulatory Visit (INDEPENDENT_AMBULATORY_CARE_PROVIDER_SITE_OTHER): Payer: Medicaid Other | Admitting: *Deleted

## 2016-08-08 DIAGNOSIS — Z3A18 18 weeks gestation of pregnancy: Secondary | ICD-10-CM | POA: Diagnosis not present

## 2016-08-08 DIAGNOSIS — O4292 Full-term premature rupture of membranes, unspecified as to length of time between rupture and onset of labor: Secondary | ICD-10-CM | POA: Insufficient documentation

## 2016-08-08 DIAGNOSIS — O99212 Obesity complicating pregnancy, second trimester: Secondary | ICD-10-CM | POA: Insufficient documentation

## 2016-08-08 DIAGNOSIS — O30042 Twin pregnancy, dichorionic/diamniotic, second trimester: Secondary | ICD-10-CM | POA: Insufficient documentation

## 2016-08-08 DIAGNOSIS — O4692 Antepartum hemorrhage, unspecified, second trimester: Secondary | ICD-10-CM | POA: Diagnosis not present

## 2016-08-08 DIAGNOSIS — Z8751 Personal history of pre-term labor: Secondary | ICD-10-CM

## 2016-08-08 DIAGNOSIS — O09212 Supervision of pregnancy with history of pre-term labor, second trimester: Secondary | ICD-10-CM | POA: Insufficient documentation

## 2016-08-08 LAB — GC/CHLAMYDIA PROBE AMP (~~LOC~~) NOT AT ARMC
CHLAMYDIA, DNA PROBE: NEGATIVE
Neisseria Gonorrhea: NEGATIVE

## 2016-08-09 ENCOUNTER — Ambulatory Visit (INDEPENDENT_AMBULATORY_CARE_PROVIDER_SITE_OTHER): Payer: Medicaid Other | Admitting: Obstetrics & Gynecology

## 2016-08-09 ENCOUNTER — Inpatient Hospital Stay (HOSPITAL_COMMUNITY)
Admission: AD | Admit: 2016-08-09 | Discharge: 2016-08-09 | Disposition: A | Discharge status: Home / Self Care | Payer: Medicaid Other | Source: Ambulatory Visit | Attending: Obstetrics and Gynecology | Admitting: Obstetrics and Gynecology

## 2016-08-09 ENCOUNTER — Other Ambulatory Visit (HOSPITAL_COMMUNITY): Payer: Self-pay | Admitting: *Deleted

## 2016-08-09 ENCOUNTER — Encounter (HOSPITAL_COMMUNITY): Payer: Self-pay | Admitting: *Deleted

## 2016-08-09 ENCOUNTER — Other Ambulatory Visit (HOSPITAL_COMMUNITY): Payer: Self-pay | Admitting: Maternal and Fetal Medicine

## 2016-08-09 VITALS — BP 105/65 | HR 100 | Wt 270.0 lb

## 2016-08-09 DIAGNOSIS — Z3A18 18 weeks gestation of pregnancy: Secondary | ICD-10-CM | POA: Diagnosis not present

## 2016-08-09 DIAGNOSIS — O42919 Preterm premature rupture of membranes, unspecified as to length of time between rupture and onset of labor, unspecified trimester: Secondary | ICD-10-CM

## 2016-08-09 DIAGNOSIS — O210 Mild hyperemesis gravidarum: Secondary | ICD-10-CM | POA: Diagnosis not present

## 2016-08-09 DIAGNOSIS — O26892 Other specified pregnancy related conditions, second trimester: Secondary | ICD-10-CM

## 2016-08-09 DIAGNOSIS — O30042 Twin pregnancy, dichorionic/diamniotic, second trimester: Secondary | ICD-10-CM

## 2016-08-09 DIAGNOSIS — O30002 Twin pregnancy, unspecified number of placenta and unspecified number of amniotic sacs, second trimester: Secondary | ICD-10-CM | POA: Insufficient documentation

## 2016-08-09 DIAGNOSIS — N898 Other specified noninflammatory disorders of vagina: Secondary | ICD-10-CM | POA: Diagnosis present

## 2016-08-09 DIAGNOSIS — O30009 Twin pregnancy, unspecified number of placenta and unspecified number of amniotic sacs, unspecified trimester: Secondary | ICD-10-CM

## 2016-08-09 DIAGNOSIS — O3680X1 Pregnancy with inconclusive fetal viability, fetus 1: Secondary | ICD-10-CM

## 2016-08-09 DIAGNOSIS — O42912 Preterm premature rupture of membranes, unspecified as to length of time between rupture and onset of labor, second trimester: Secondary | ICD-10-CM | POA: Insufficient documentation

## 2016-08-09 NOTE — MAU Note (Signed)
PT SAYS   SHE  HAS  TWINS-     ON WED  HERE IN MAU -  SROM  WITH  BABY A -    HAD  U/S  AND  SOME  FLUID  STILL THERE.      THEN  TONIGHT   AT 815   SHE WENT   TO B-ROOM    AND  PASSED  SOMETHING  LOOKED  LIKE  TISSUE  .  YESTERDAY   BABY  B  WAS  FINE

## 2016-08-09 NOTE — Discharge Instructions (Signed)
Second Trimester of Pregnancy The second trimester is from week 13 through week 28, month 4 through 6. This is often the time in pregnancy that you feel your best. Often times, morning sickness has lessened or quit. You may have more energy, and you may get hungry more often. Your unborn baby (fetus) is growing rapidly. At the end of the sixth month, he or she is about 9 inches long and weighs about 1 pounds. You will likely feel the baby move (quickening) between 18 and 20 weeks of pregnancy. Follow these instructions at home:  Avoid all smoking, herbs, and alcohol. Avoid drugs not approved by your doctor.  Do not use any tobacco products, including cigarettes, chewing tobacco, and electronic cigarettes. If you need help quitting, ask your doctor. You may get counseling or other support to help you quit.  Only take medicine as told by your doctor. Some medicines are safe and some are not during pregnancy.  Exercise only as told by your doctor. Stop exercising if you start having cramps.  Eat regular, healthy meals.  Wear a good support bra if your breasts are tender.  Do not use hot tubs, steam rooms, or saunas.  Wear your seat belt when driving.  Avoid raw meat, uncooked cheese, and liter boxes and soil used by cats.  Take your prenatal vitamins.  Take 1500-2000 milligrams of calcium daily starting at the 20th week of pregnancy until you deliver your baby.  Try taking medicine that helps you poop (stool softener) as needed, and if your doctor approves. Eat more fiber by eating fresh fruit, vegetables, and whole grains. Drink enough fluids to keep your pee (urine) clear or pale yellow.  Take warm water baths (sitz baths) to soothe pain or discomfort caused by hemorrhoids. Use hemorrhoid cream if your doctor approves.  If you have puffy, bulging veins (varicose veins), wear support hose. Raise (elevate) your feet for 15 minutes, 3-4 times a day. Limit salt in your diet.  Avoid heavy  lifting, wear low heals, and sit up straight.  Rest with your legs raised if you have leg cramps or low back pain.  Visit your dentist if you have not gone during your pregnancy. Use a soft toothbrush to brush your teeth. Be gentle when you floss.  You can have sex (intercourse) unless your doctor tells you not to.  Go to your doctor visits. Get help if:  You feel dizzy.  You have mild cramps or pressure in your lower belly (abdomen).  You have a nagging pain in your belly area.  You continue to feel sick to your stomach (nauseous), throw up (vomit), or have watery poop (diarrhea).  You have bad smelling fluid coming from your vagina.  You have pain with peeing (urination). Get help right away if:  You have a fever.  You are leaking fluid from your vagina.  You have spotting or bleeding from your vagina.  You have severe belly cramping or pain.  You lose or gain weight rapidly.  You have trouble catching your breath and have chest pain.  You notice sudden or extreme puffiness (swelling) of your face, hands, ankles, feet, or legs.  You have not felt the baby move in over an hour.  You have severe headaches that do not go away with medicine.  You have vision changes. This information is not intended to replace advice given to you by your health care provider. Make sure you discuss any questions you have with your health care   provider. Document Released: 08/08/2009 Document Revised: 10/20/2015 Document Reviewed: 07/15/2012 Elsevier Interactive Patient Education  2017 Elsevier Inc.  

## 2016-08-09 NOTE — MAU Provider Note (Signed)
History     CSN: [id number]  Arrival date and time: 08/09/16 2026   First Provider Initiated Contact with Patient 08/09/16 2108      Chief Complaint  Patient presents with  . PASSED SOMETHING  . Vaginal Discharge   Traci Peterson is a 31 y.o. G2P0100 at [redacted]w[redacted]d who presents today with passage of tissue from the vagina. She had PPROM of baby A on 3/13. She had a Dr. Appointment today, and states that every thing was fine at that time. However, this evening around 2015 she was in the bathroom and felt pressure on her cervix and then she passed a "white glob". She thinks it may have been mucous, but she feels she is on high alert for anything abnormla. She reports that she is taking her abx as prescribed, and has a FU appointment in one week.     Vaginal Discharge  The patient's primary symptoms include vaginal discharge. The patient's pertinent negatives include no pelvic pain. This is a new problem. The current episode started today. The problem occurs intermittently. The problem has been unchanged. The patient is experiencing no pain. She is pregnant. Pertinent negatives include no abdominal pain, chills, dysuria, fever, nausea, urgency or vomiting. The vaginal discharge was white. There has been no bleeding.    Past Medical History:  Diagnosis Date  . Anxiety    no current med.  . Chronic headaches   . Gall stones   . HPV in female   . Jaw snapping    states jaw pops if opens mouth too wide  . Ovarian cyst   . PCOS (polycystic ovarian syndrome)   . Pilonidal cyst 02/2013  . Preterm labor 2016   PPROM @ 18 wks, delivery of IUFD @ 21 wks  . Vaginal Pap smear, abnormal     Past Surgical History:  Procedure Laterality Date  . CHOLECYSTECTOMY N/A 07/21/2016   Procedure: LAPAROSCOPIC CHOLECYSTECTOMY;  Surgeon: Abigail Miyamoto, MD;  [facility];  Service: General;  Laterality: N/A;  . PILONIDAL CYST EXCISION N/A 03/12/2013   Procedure: CYST EXCISION PILONIDAL EXTENSIVE;   Surgeon: Shelly Rubenstein, MD;  Location: [facility];  Service: General;  Laterality: N/A;  . WISDOM TOOTH EXTRACTION      Family History  Problem Relation Age of Onset  . Hypertension Mother   . Hyperlipidemia Mother   . Diabetes Mother   . Thyroid disease Mother   . Diabetes Father   . Hypertension Father   . Alcohol abuse Father   . Breast cancer Maternal Aunt   . Lung cancer Paternal Grandfather     Social History  Substance Use Topics  . Smoking status: Former Smoker    Packs/day: 0.50    Years: 6.00    Types: Cigarettes    Quit date: 03/28/2016  . Smokeless tobacco: Former Neurosurgeon     Comment: 3-5 cig./day  . Alcohol use No     Comment: socially    Allergies: No Known Allergies  Facility-Administered Medications Prior to Admission  Medication Dose Route Frequency Provider Last Rate Last Dose  . hydroxyprogesterone caproate (MAKENA) 250 mg/mL injection 250 mg  250 mg Intramuscular Weekly Lesly Dukes, MD   250 mg at 08/08/16 0827   Prescriptions Prior to Admission  Medication Sig Dispense Refill Last Dose  . amoxicillin (AMOXIL) 500 MG capsule Take 1 capsule (500 mg total) by mouth 3 (three) times daily. 21 capsule 0 Taking  . [START ON 08/14/2016] azithromycin (ZITHROMAX) 250  MG tablet Take 4 tablets (1,000 mg total) by mouth once. Take first 2 tablets together, then 1 every day until finished. 4 tablet 0 Taking  . docusate sodium (COLACE) 100 MG capsule Take 1 capsule (100 mg total) by mouth 2 (two) times daily as needed. (Patient taking differently: Take 100 mg by mouth 2 (two) times daily as needed for moderate constipation. ) 30 capsule 2 Taking  . Doxylamine-Pyridoxine (DICLEGIS) 10-10 MG TBEC Take 2 tablets by mouth at bedtime. 60 tablet 2 Taking  . HYDROcodone-acetaminophen (NORCO/VICODIN) 5-325 MG tablet Take 1-2 tablets by mouth every 6 (six) hours as needed for moderate pain. 30 tablet 0 Taking  . ibuprofen (ADVIL,MOTRIN) 600 MG tablet Take 1  tablet (600 mg total) by mouth every 6 (six) hours as needed. 10 tablet 0 Taking  . metoCLOPramide (REGLAN) 10 MG tablet Take 1 tablet (10 mg total) by mouth every 6 (six) hours as needed for nausea. 30 tablet 1 Taking  . oxyCODONE-acetaminophen (PERCOCET/ROXICET) 5-325 MG tablet Take 1-2 tablets by mouth every 6 (six) hours as needed. 30 tablet 0 Taking  . polyethylene glycol powder (MIRALAX) powder Take 17 g by mouth daily. (Patient taking differently: Take 17 g by mouth daily as needed for moderate constipation. ) 255 g 0 Taking  . Prenatal Vit-Fe Fumarate-FA (PRENATAL MULTIVITAMIN) TABS tablet Take 1 tablet by mouth every morning.    Taking  . promethazine (PHENERGAN) 25 MG suppository Place 1 suppository (25 mg total) rectally every 6 (six) hours as needed for nausea or vomiting. 30 suppository 1 Taking  . ranitidine (ZANTAC) 150 MG tablet Take 1 tablet (150 mg total) by mouth 2 (two) times daily. 60 tablet 0 Taking  . terconazole (TERAZOL 7) 0.4 % vaginal cream Place 1 applicator vaginally at bedtime.   Taking    Review of Systems  Constitutional: Negative for chills and fever.  Gastrointestinal: Negative for abdominal pain, nausea and vomiting.  Genitourinary: Positive for vaginal discharge. Negative for dysuria, pelvic pain, urgency, vaginal bleeding and vaginal pain.   Physical Exam   Blood pressure 115/65, pulse 93, temperature 97.8 F (36.6 C), temperature source Oral, resp. rate 20, height $Rem oveBeforeDEID_oEUintSkfdAuckNNeVsNpazURmtsYVcl$5\' 2"trual period 01/13/2016.  Physical Exam  Nursing note and vitals reviewed. Constitutional: She is oriented to person, place, and time. She appears well-developed and well-nourished. No distress.  HENT:  Head: Normocephalic.  Cardiovascular: Normal rate.   Respiratory: Effort normal.  GI: Soft. There is no tenderness. There is no rebound.  Genitourinary:  Genitourinary Comments:  External: no lesion Vagina: small amount of fluid seen   Cervix: pink, smooth,visually closed and thick  Uterus: AGA   Neurological: She is alert and oriented to person, place, and time.  Skin: Skin is warm and dry.  Psychiatric: She has a normal mood and affect.  FHT A: 160 FHT B: 167   MAU Course  Procedures  MDM Bedside US: + FHT visible x 2.   Assessment and Plan   1. Preterm premature rupture of membranes (PPROM) with unknown onset of labor   2. Hyperemesis gravidarum   3. Twin pregnancy with problem affecting pregnancy, antepartum   4. [redacted] weeks gestation of pregnancy   5. Vaginal discharge during pregnancy in second trimester    DC home Comfort measures reviewed  2ndTrimester precautions  Bleeding precautions Fever precautions Labor and delivery precautions  RX: none Return to MAU as needed FU with OB as planned  Follow-up  Information    [facility] Follow up.   Specialty:  Obstetrics and Gynecology Contact information: [address] [place] [address] [phone]           Tawnya Crook 08/09/2016, 9:10 PM

## 2016-08-09 NOTE — Progress Notes (Signed)
Baby A has  PROM

## 2016-08-09 NOTE — Progress Notes (Signed)
   PRENATAL VISIT NOTE  Subjective:  Traci Peterson is a 31 y.o. G2P0100 at 4854w1d being seen today for ongoing prenatal care.  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  Patient reports no complaints. She reports that the bleeding has stopped and she only occasionally has some fluid leaking  . Vag. Bleeding: None.   . Denies leaking of fluid.   The following portions of the patient's history were reviewed and updated as appropriate: allergies, current medications, past family history, past medical history, past social history, past surgical history and problem list. Problem list updated.  Objective:   Vitals:   08/09/16 1352  BP: 105/65  Pulse: 100  Weight: 270 lb (122.5 kg)    Fetal Status: Fetal Heart Rate (bpm): 158/166         General:  Alert, oriented and cooperative. Patient is in no acute distress.  Skin: Skin is warm and dry. No rash noted.   Cardiovascular: Normal heart rate noted  Respiratory: Normal respiratory effort, no problems with respiration noted  Abdomen: Soft, gravid, appropriate for gestational age.       Pelvic:  Cervical exam deferred        Extremities: Normal range of motion.  Edema: Trace  Mental Status: Normal mood and affect. Normal behavior. Normal judgment and thought content.   Assessment and Plan:  Pregnancy: G2P0100 at 5754w1d  1. Twin pregnancy with  problem affecting pregnancy, antepartum   2. Morbidly obese (HCC) 3. PPROM of twin A- I did a bedside u/s and did see some fluid around Twin A (a little). I have given her pain/temperature/bleeding precautions. She will be seen weekly.  Preterm labor symptoms and general obstetric precautions including but not limited to vaginal bleeding, contractions, leaking of fluid and fetal movement were reviewed in detail with the patient. Please refer to After Visit Summary for other counseling recommendations.  No Follow-up on file.   Allie BossierMyra C Jinx Gilden, MD

## 2016-08-12 ENCOUNTER — Inpatient Hospital Stay (HOSPITAL_COMMUNITY)
Admission: AD | Admit: 2016-08-12 | Discharge: 2016-08-12 | Disposition: A | Discharge status: Home / Self Care | Payer: Medicaid Other | Source: Ambulatory Visit | Attending: Family Medicine | Admitting: Family Medicine

## 2016-08-12 ENCOUNTER — Encounter (HOSPITAL_COMMUNITY): Payer: Self-pay

## 2016-08-12 DIAGNOSIS — R112 Nausea with vomiting, unspecified: Secondary | ICD-10-CM | POA: Diagnosis not present

## 2016-08-12 DIAGNOSIS — K5900 Constipation, unspecified: Secondary | ICD-10-CM | POA: Insufficient documentation

## 2016-08-12 DIAGNOSIS — Z801 Family history of malignant neoplasm of trachea, bronchus and lung: Secondary | ICD-10-CM | POA: Diagnosis not present

## 2016-08-12 DIAGNOSIS — E282 Polycystic ovarian syndrome: Secondary | ICD-10-CM | POA: Diagnosis not present

## 2016-08-12 DIAGNOSIS — O3482 Maternal care for other abnormalities of pelvic organs, second trimester: Secondary | ICD-10-CM | POA: Diagnosis not present

## 2016-08-12 DIAGNOSIS — Z79899 Other long term (current) drug therapy: Secondary | ICD-10-CM | POA: Diagnosis not present

## 2016-08-12 DIAGNOSIS — O99282 Endocrine, nutritional and metabolic diseases complicating pregnancy, second trimester: Secondary | ICD-10-CM | POA: Insufficient documentation

## 2016-08-12 DIAGNOSIS — Z833 Family history of diabetes mellitus: Secondary | ICD-10-CM | POA: Diagnosis not present

## 2016-08-12 DIAGNOSIS — E860 Dehydration: Secondary | ICD-10-CM

## 2016-08-12 DIAGNOSIS — O99342 Other mental disorders complicating pregnancy, second trimester: Secondary | ICD-10-CM | POA: Insufficient documentation

## 2016-08-12 DIAGNOSIS — O30009 Twin pregnancy, unspecified number of placenta and unspecified number of amniotic sacs, unspecified trimester: Secondary | ICD-10-CM

## 2016-08-12 DIAGNOSIS — O30002 Twin pregnancy, unspecified number of placenta and unspecified number of amniotic sacs, second trimester: Secondary | ICD-10-CM | POA: Insufficient documentation

## 2016-08-12 DIAGNOSIS — Z3A18 18 weeks gestation of pregnancy: Secondary | ICD-10-CM | POA: Diagnosis present

## 2016-08-12 DIAGNOSIS — Z811 Family history of alcohol abuse and dependence: Secondary | ICD-10-CM | POA: Insufficient documentation

## 2016-08-12 DIAGNOSIS — O219 Vomiting of pregnancy, unspecified: Secondary | ICD-10-CM | POA: Diagnosis present

## 2016-08-12 DIAGNOSIS — O210 Mild hyperemesis gravidarum: Secondary | ICD-10-CM

## 2016-08-12 DIAGNOSIS — Z87891 Personal history of nicotine dependence: Secondary | ICD-10-CM | POA: Diagnosis not present

## 2016-08-12 DIAGNOSIS — Z803 Family history of malignant neoplasm of breast: Secondary | ICD-10-CM | POA: Insufficient documentation

## 2016-08-12 DIAGNOSIS — F419 Anxiety disorder, unspecified: Secondary | ICD-10-CM | POA: Insufficient documentation

## 2016-08-12 DIAGNOSIS — Z9049 Acquired absence of other specified parts of digestive tract: Secondary | ICD-10-CM | POA: Diagnosis not present

## 2016-08-12 DIAGNOSIS — Z9889 Other specified postprocedural states: Secondary | ICD-10-CM | POA: Insufficient documentation

## 2016-08-12 DIAGNOSIS — Z8349 Family history of other endocrine, nutritional and metabolic diseases: Secondary | ICD-10-CM | POA: Insufficient documentation

## 2016-08-12 DIAGNOSIS — Z8489 Family history of other specified conditions: Secondary | ICD-10-CM | POA: Diagnosis not present

## 2016-08-12 DIAGNOSIS — O99612 Diseases of the digestive system complicating pregnancy, second trimester: Secondary | ICD-10-CM | POA: Diagnosis not present

## 2016-08-12 DIAGNOSIS — Z8249 Family history of ischemic heart disease and other diseases of the circulatory system: Secondary | ICD-10-CM | POA: Insufficient documentation

## 2016-08-12 LAB — URINALYSIS, ROUTINE W REFLEX MICROSCOPIC
BILIRUBIN URINE: NEGATIVE
Bacteria, UA: NONE SEEN
GLUCOSE, UA: NEGATIVE mg/dL
HGB URINE DIPSTICK: NEGATIVE
KETONES UR: 80 mg/dL — AB
Leukocytes, UA: NEGATIVE
NITRITE: NEGATIVE
PROTEIN: 30 mg/dL — AB
Specific Gravity, Urine: 1.024 (ref 1.005–1.030)
pH: 6 (ref 5.0–8.0)

## 2016-08-12 MED ORDER — LACTATED RINGERS IV SOLN
INTRAVENOUS | Status: DC
  Administered 2016-08-12: 11:00:00 via INTRAVENOUS

## 2016-08-12 MED ORDER — PROMETHAZINE HCL 25 MG/ML IJ SOLN
25.0000 mg | Freq: Once | INTRAMUSCULAR | Status: AC
  Administered 2016-08-12: 25 mg via INTRAVENOUS
  Filled 2016-08-12: qty 1

## 2016-08-12 NOTE — MAU Note (Signed)
Has been having severe vomiting, is dehydrated and fatigued.  Had not had a BM  For 2 days, thinks it is from the zofran.  Finally had a bm this morning.  Took a phenergan suppository at 0800.  Having cramping in lower abd, worried since the water has broke on one of the babies

## 2016-08-12 NOTE — MAU Provider Note (Signed)
Chief Complaint:  No chief complaint on file.   First Provider Initiated Contact with Patient 08/12/16 1019     HPI: Traci Peterson is a 31 y.o. G2P0100 at [redacted]w[redacted]d with a twin gestation who presents to maternity admissions reporting vomiting which has persisted since 2 days ago.  Did eat jambalaya yesterday.  Has been constipated, relieved this morning.  Phenergan suppository helped some today.  She denies vaginal bleeding, vaginal itching/burning, urinary symptoms, h/a, dizziness, n/v, diarrhea, constipation or fever/chills.  She denies headache, visual changes or RUQ abdominal pain.   Continues to leak fluid.  Unable to get [place] a urine specimen  Pregnancy is high risk and remarkable for PPROM of Twin A on 08/07/16.    Emesis   This is a recurrent problem. The current episode started yesterday. The problem occurs intermittently. The problem has been unchanged. There has been no fever. Pertinent negatives include no chest pain, chills, diarrhea, dizziness, fever, headaches or myalgias. Risk factors include suspect food intake. Treatments tried: phenergan suppository. The treatment provided mild relief.    RN Note: Has been having severe vomiting, is dehydrated and fatigued.  Had not had a BM  For 2 days, thinks it is from the zofran.  Finally had a bm this morning.  Took a phenergan suppository at 0800.  Having cramping in lower abd, worried since the water has broke on one of the babies  Past Medical History: Past Medical History:  Diagnosis Date  . Anxiety    no current med.  . Chronic headaches   . Gall stones   . HPV in female   . Jaw snapping    states jaw pops if opens mouth too wide  . Ovarian cyst   . PCOS (polycystic ovarian syndrome)   . Pilonidal cyst 02/2013  . Preterm labor 2016   PPROM @ 18 wks, delivery of IUFD @ 21 wks  . Vaginal Pap smear, abnormal     Past obstetric history: OB History  Gravida Para Term Preterm AB Living  2 1   1    0  SAB TAB Ectopic Multiple  Live Births        0      # Outcome Date GA Lbr Len/2nd Weight Sex Delivery Anes PTL Lv  2 Current           1 Preterm 11/12/14 [redacted]w[redacted]d  13.2 oz (0.374 kg) M Vag-Spont None  FD      Past Surgical History: Past Surgical History:  Procedure Laterality Date  . CHOLECYSTECTOMY N/A 07/21/2016   Procedure: LAPAROSCOPIC CHOLECYSTECTOMY;  Surgeon: Abigail Miyamoto, MD;  Location: [facility] OR;  Service: General;  Laterality: N/A;  . PILONIDAL CYST EXCISION N/A 03/12/2013   Procedure: CYST EXCISION PILONIDAL EXTENSIVE;  Surgeon: Shelly Rubenstein, MD;  Location: [facility];  Service: General;  Laterality: N/A;  . WISDOM TOOTH EXTRACTION      Family History: Family History  Problem Relation Age of Onset  . Hypertension Mother   . Hyperlipidemia Mother   . Diabetes Mother   . Thyroid disease Mother   . Diabetes Father   . Hypertension Father   . Alcohol abuse Father   . Breast cancer Maternal Aunt   . Lung cancer Paternal Grandfather     Social History: Social History  Substance Use Topics  . Smoking status: Former Smoker    Packs/day: 0.50    Years: 6.00    Types: Cigarettes    Quit date: 03/28/2016  . Smokeless  tobacco: Former NeurosurgeonUser     Comment: 3-5 cig./day  . Alcohol use No     Comment: socially    Allergies: No Known Allergies  Meds:  Facility-Administered Medications Prior to Admission  Medication Dose Route Frequency Provider Last Rate Last Dose  . hydroxyprogesterone caproate (MAKENA) 250 mg/mL injection 250 mg  250 mg Intramuscular Weekly Lesly DukesKelly H Leggett, MD   250 mg at 08/08/16 0827   Prescriptions Prior to Admission  Medication Sig Dispense Refill Last Dose  . amoxicillin (AMOXIL) 500 MG capsule Take 1 capsule (500 mg total) by mouth 3 (three) times daily. 21 capsule 0 Taking  . [START ON 08/14/2016] azithromycin (ZITHROMAX) 250 MG tablet Take 4 tablets (1,000 mg total) by mouth once. Take first 2 tablets together, then 1 every day until finished. 4 tablet  0 Taking  . docusate sodium (COLACE) 100 MG capsule Take 1 capsule (100 mg total) by mouth 2 (two) times daily as needed. (Patient taking differently: Take 100 mg by mouth 2 (two) times daily as needed for moderate constipation. ) 30 capsule 2 Taking  . Doxylamine-Pyridoxine (DICLEGIS) 10-10 MG TBEC Take 2 tablets by mouth at bedtime. 60 tablet 2 Taking  . HYDROcodone-acetaminophen (NORCO/VICODIN) 5-325 MG tablet Take 1-2 tablets by mouth every 6 (six) hours as needed for moderate pain. 30 tablet 0 Taking  . ibuprofen (ADVIL,MOTRIN) 600 MG tablet Take 1 tablet (600 mg total) by mouth every 6 (six) hours as needed. 10 tablet 0 Taking  . metoCLOPramide (REGLAN) 10 MG tablet Take 1 tablet (10 mg total) by mouth every 6 (six) hours as needed for nausea. 30 tablet 1 Taking  . oxyCODONE-acetaminophen (PERCOCET/ROXICET) 5-325 MG tablet Take 1-2 tablets by mouth every 6 (six) hours as needed. 30 tablet 0 Taking  . polyethylene glycol powder (MIRALAX) powder Take 17 g by mouth daily. (Patient taking differently: Take 17 g by mouth daily as needed for moderate constipation. ) 255 g 0 Taking  . Prenatal Vit-Fe Fumarate-FA (PRENATAL MULTIVITAMIN) TABS tablet Take 1 tablet by mouth every morning.    Taking  . promethazine (PHENERGAN) 25 MG suppository Place 1 suppository (25 mg total) rectally every 6 (six) hours as needed for nausea or vomiting. 30 suppository 1 Taking  . ranitidine (ZANTAC) 150 MG tablet Take 1 tablet (150 mg total) by mouth 2 (two) times daily. 60 tablet 0 Taking  . terconazole (TERAZOL 7) 0.4 % vaginal cream Place 1 applicator vaginally at bedtime.   Taking    I have reviewed patient's Past Medical Hx, Surgical Hx, Family Hx, Social Hx, medications and allergies.   ROS:  Review of Systems  Constitutional: Negative for chills and fever.  Cardiovascular: Negative for chest pain.  Gastrointestinal: Positive for vomiting. Negative for diarrhea.  Musculoskeletal: Negative for myalgias.   Neurological: Negative for dizziness and headaches.   Other systems negative  Physical Exam  Patient Vitals for the past 24 hrs:  BP Temp Temp src Pulse Resp SpO2 Weight  08/12/16 0933 129/65 98.6 F (37 C) Oral (!) 113 16 100 % 268 lb 4 oz (121.7 kg)  08/12/16 0931 - - - - - 100 % -   Constitutional: Well-developed, well-nourished female in no acute distress.  Cardiovascular: normal rate and rhythm, mildly tachycardic Respiratory: normal effort, clear to auscultation bilaterally GI: Abd soft, non-tender, gravid appropriate for gestational age.   No rebound or guarding. MS: Extremities nontender, no edema, normal ROM Neurologic: Alert and oriented x 4.  GU: Neg CVAT.  PELVIC  EXAM:  deferred.  Small amount clear fluid leaking from vagina  FHT:  164/176   Labs: O/POS/-- (01/10 0948) Results for orders placed or performed during the hospital encounter of 08/12/16 (from the past 24 hour(s))  Urinalysis, Routine w reflex microscopic     Status: Abnormal   Collection Time: 08/12/16 11:50 AM  Result Value Ref Range   Color, Urine YELLOW YELLOW   APPearance HAZY (A) CLEAR   Specific Gravity, Urine 1.024 1.005 - 1.030   pH 6.0 5.0 - 8.0   Glucose, UA NEGATIVE NEGATIVE mg/dL   Hgb urine dipstick NEGATIVE NEGATIVE   Bilirubin Urine NEGATIVE NEGATIVE   Ketones, ur 80 (A) NEGATIVE mg/dL   Protein, ur 30 (A) NEGATIVE mg/dL   Nitrite NEGATIVE NEGATIVE   Leukocytes, UA NEGATIVE NEGATIVE   RBC / HPF 0-5 0 - 5 RBC/hpf   WBC, UA 0-5 0 - 5 WBC/hpf   Bacteria, UA NONE SEEN NONE SEEN   Squamous Epithelial / LPF 0-5 (A) NONE SEEN   Mucous PRESENT     Imaging:    MAU Course/MDM: I have ordered labs and reviewed results. Urine shows some dehydration  Consult Dr Shawnie Pons with presentation, exam findings and test results. Will hydrate and observe Treatments in MAU included 2 liters of IV fluids with Phenergan for nausea.  No vomiting while here..   After second liter, patient requested to  go home  Assessment: 1. Hyperemesis gravidarum   2. Twin pregnancy with problem affecting pregnancy, antepartum     Plan: Discharge home Has Phenergan suppositories at home for PRN use Preterm Labor precautions and fetal kick counts Follow up in Office for prenatal visits and recheck of status  Encouraged to return here or to other Urgent Care/ED if she develops worsening of symptoms, increase in pain, fever, or other concerning symptoms.   Pt stable at time of discharge.  Wynelle Bourgeois CNM, MSN Certified Nurse-Midwife 08/12/2016 10:19 AM

## 2016-08-12 NOTE — Discharge Instructions (Signed)

## 2016-08-15 ENCOUNTER — Ambulatory Visit (INDEPENDENT_AMBULATORY_CARE_PROVIDER_SITE_OTHER): Payer: Medicaid Other | Admitting: Obstetrics & Gynecology

## 2016-08-15 VITALS — BP 111/65 | HR 107 | Wt 272.0 lb

## 2016-08-15 DIAGNOSIS — O30009 Twin pregnancy, unspecified number of placenta and unspecified number of amniotic sacs, unspecified trimester: Secondary | ICD-10-CM

## 2016-08-15 DIAGNOSIS — O30002 Twin pregnancy, unspecified number of placenta and unspecified number of amniotic sacs, second trimester: Secondary | ICD-10-CM

## 2016-08-15 DIAGNOSIS — O42912 Preterm premature rupture of membranes, unspecified as to length of time between rupture and onset of labor, second trimester: Secondary | ICD-10-CM

## 2016-08-15 DIAGNOSIS — O42919 Preterm premature rupture of membranes, unspecified as to length of time between rupture and onset of labor, unspecified trimester: Secondary | ICD-10-CM

## 2016-08-16 ENCOUNTER — Inpatient Hospital Stay (HOSPITAL_COMMUNITY)
Admission: AD | Admit: 2016-08-16 | Discharge: 2016-08-16 | Disposition: A | Discharge status: Home / Self Care | Payer: Medicaid Other | Source: Ambulatory Visit | Attending: Obstetrics & Gynecology | Admitting: Obstetrics & Gynecology

## 2016-08-16 ENCOUNTER — Encounter (HOSPITAL_COMMUNITY): Payer: Self-pay | Admitting: *Deleted

## 2016-08-16 DIAGNOSIS — Z3A19 19 weeks gestation of pregnancy: Secondary | ICD-10-CM | POA: Diagnosis not present

## 2016-08-16 DIAGNOSIS — O30009 Twin pregnancy, unspecified number of placenta and unspecified number of amniotic sacs, unspecified trimester: Secondary | ICD-10-CM

## 2016-08-16 DIAGNOSIS — O30042 Twin pregnancy, dichorionic/diamniotic, second trimester: Secondary | ICD-10-CM | POA: Diagnosis not present

## 2016-08-16 DIAGNOSIS — O26892 Other specified pregnancy related conditions, second trimester: Secondary | ICD-10-CM | POA: Diagnosis not present

## 2016-08-16 DIAGNOSIS — Z87891 Personal history of nicotine dependence: Secondary | ICD-10-CM | POA: Diagnosis not present

## 2016-08-16 DIAGNOSIS — O210 Mild hyperemesis gravidarum: Secondary | ICD-10-CM

## 2016-08-16 DIAGNOSIS — N949 Unspecified condition associated with female genital organs and menstrual cycle: Secondary | ICD-10-CM

## 2016-08-16 DIAGNOSIS — O42912 Preterm premature rupture of membranes, unspecified as to length of time between rupture and onset of labor, second trimester: Secondary | ICD-10-CM | POA: Diagnosis present

## 2016-08-16 LAB — URINALYSIS, ROUTINE W REFLEX MICROSCOPIC
BACTERIA UA: NONE SEEN
BILIRUBIN URINE: NEGATIVE
GLUCOSE, UA: 50 mg/dL — AB
Ketones, ur: NEGATIVE mg/dL
LEUKOCYTES UA: NEGATIVE
NITRITE: NEGATIVE
Protein, ur: NEGATIVE mg/dL
SPECIFIC GRAVITY, URINE: 1.011 (ref 1.005–1.030)
pH: 6 (ref 5.0–8.0)

## 2016-08-16 NOTE — MAU Provider Note (Signed)
History     CSN: [id number]  Arrival date and time: 08/16/16 1043   First Provider Initiated Contact with Patient 08/16/16 1138      Chief Complaint  Patient presents with  . Abdominal Pain   G2P0100 @19 .4 with didi twins and known PPROM of twin A here with vaginal pressure since last night. She reports waking during the night a few times and was involuntarily pushing. She denies VB. She reports occasional cramping and leaking clear fluid. At times the fluid is yellow and she is unsure if there is odor present. She denies fever or chills. She reports FM x2.     OB History    Gravida Para Term Preterm AB Living   2 1   1    0   SAB TAB Ectopic Multiple Live Births         0        Past Medical History:  Diagnosis Date  . Anxiety    no current med.  . Chronic headaches   . Gall stones   . HPV in female   . Jaw snapping    states jaw pops if opens mouth too wide  . Ovarian cyst   . PCOS (polycystic ovarian syndrome)   . Pilonidal cyst 02/2013  . Preterm labor 2016   PPROM @ 18 wks, delivery of IUFD @ 21 wks  . Vaginal Pap smear, abnormal     Past Surgical History:  Procedure Laterality Date  . CHOLECYSTECTOMY N/A 07/21/2016   Procedure: LAPAROSCOPIC CHOLECYSTECTOMY;  Surgeon: Abigail Miyamoto, MD;  Location: [facility] OR;  Service: General;  Laterality: N/A;  . PILONIDAL CYST EXCISION N/A 03/12/2013   Procedure: CYST EXCISION PILONIDAL EXTENSIVE;  Surgeon: Shelly Rubenstein, MD;  Location: [facility];  Service: General;  Laterality: N/A;  . WISDOM TOOTH EXTRACTION      Family History  Problem Relation Age of Onset  . Hypertension Mother   . Hyperlipidemia Mother   . Diabetes Mother   . Thyroid disease Mother   . Diabetes Father   . Hypertension Father   . Alcohol abuse Father   . Breast cancer Maternal Aunt   . Lung cancer Paternal Grandfather     Social History  Substance Use Topics  . Smoking status: Former Smoker    Packs/day: 0.50    Years:  6.00    Types: Cigarettes    Quit date: 03/28/2016  . Smokeless tobacco: Former Neurosurgeon     Comment: 3-5 cig./day  . Alcohol use No     Comment: socially    Allergies: No Known Allergies  Facility-Administered Medications Prior to Admission  Medication Dose Route Frequency Provider Last Rate Last Dose  . hydroxyprogesterone caproate (MAKENA) 250 mg/mL injection 250 mg  250 mg Intramuscular Weekly Lesly Dukes, MD   250 mg at 08/15/16 1135   Prescriptions Prior to Admission  Medication Sig Dispense Refill Last Dose  . docusate sodium (COLACE) 100 MG capsule Take 1 capsule (100 mg total) by mouth 2 (two) times daily as needed. (Patient taking differently: Take 100 mg by mouth 2 (two) times daily as needed for moderate constipation. ) 30 capsule 2 Past Week at Unknown time  . Doxylamine-Pyridoxine (DICLEGIS) 10-10 MG TBEC Take 2 tablets by mouth at bedtime. 60 tablet 2 08/15/2016 at Unknown time  . lansoprazole (PREVACID) 15 MG capsule Take 15 mg by mouth daily at 12 noon.   08/16/2016 at Unknown time  . metoCLOPramide (REGLAN) 10 MG tablet  Take 1 tablet (10 mg total) by mouth every 6 (six) hours as needed for nausea. 30 tablet 1 Past Month at Unknown time  . polyethylene glycol powder (MIRALAX) powder Take 17 g by mouth daily. (Patient taking differently: Take 17 g by mouth daily as needed for moderate constipation. ) 255 g 0 Past Month at Unknown time  . Prenatal Vit-Fe Fumarate-FA (PRENATAL MULTIVITAMIN) TABS tablet Take 1 tablet by mouth every morning.    Past Week at Unknown time  . promethazine (PHENERGAN) 25 MG suppository Place 1 suppository (25 mg total) rectally every 6 (six) hours as needed for nausea or vomiting. 30 suppository 1 Past Week at Unknown time  . terconazole (TERAZOL 7) 0.4 % vaginal cream Place 1 applicator vaginally at bedtime.   Past Week at Unknown time  . amoxicillin (AMOXIL) 500 MG capsule Take 1 capsule (500 mg total) by mouth 3 (three) times daily. (Patient not  taking: Reported on 08/16/2016) 21 capsule 0 Completed Course at Unknown time  . HYDROcodone-acetaminophen (NORCO/VICODIN) 5-325 MG tablet Take 1-2 tablets by mouth every 6 (six) hours as needed for moderate pain. (Patient not taking: Reported on 08/15/2016) 30 tablet 0 Not Taking at Unknown time  . ibuprofen (ADVIL,MOTRIN) 600 MG tablet Take 1 tablet (600 mg total) by mouth every 6 (six) hours as needed. (Patient not taking: Reported on 08/15/2016) 10 tablet 0 Not Taking at Unknown time  . oxyCODONE-acetaminophen (PERCOCET/ROXICET) 5-325 MG tablet Take 1-2 tablets by mouth every 6 (six) hours as needed. (Patient not taking: Reported on 08/15/2016) 30 tablet 0 Not Taking at Unknown time  . ranitidine (ZANTAC) 150 MG tablet Take 1 tablet (150 mg total) by mouth 2 (two) times daily. (Patient not taking: Reported on 08/15/2016) 60 tablet 0 Not Taking at Unknown time    Review of Systems  Constitutional: Negative for chills and fever.  Gastrointestinal: Negative for abdominal pain.  Genitourinary: Positive for vaginal discharge. Negative for dysuria and vaginal bleeding.   Physical Exam   Blood pressure (!) 123/49, pulse 98, temperature 99 F (37.2 C), resp. rate 18, height 5\' 2"  (1.575 m), weight 123.8 kg (273 lb), last menstrual period 01/13/2016.  Physical Exam  Nursing note and vitals reviewed. Constitutional: She is oriented to person, place, and time. She appears well-developed and well-nourished. No distress.  HENT:  Head: Normocephalic and atraumatic.  Neck: Normal range of motion.  Cardiovascular: Normal rate.   Respiratory: Effort normal.  GI: Soft. She exhibits no distension and no mass. There is tenderness (lower middle ). There is no rebound and no guarding.  gravid  Genitourinary:  Genitourinary Comments: SSE: visually closed, clear fluid coming from os  Musculoskeletal: Normal range of motion.  Neurological: She is alert and oriented to person, place, and time.  Skin: Skin is  warm.  Psychiatric: She has a normal mood and affect.   FHT A: 178 bpm FHT B: 163 bpm  Results for orders placed or performed during the hospital encounter of 08/16/16 (from the past 24 hour(s))  Urinalysis, Routine w reflex microscopic     Status: Abnormal   Collection Time: 08/16/16 11:00 AM  Result Value Ref Range   Color, Urine YELLOW YELLOW   APPearance CLEAR CLEAR   Specific Gravity, Urine 1.011 1.005 - 1.030   pH 6.0 5.0 - 8.0   Glucose, UA 50 (A) NEGATIVE mg/dL   Hgb urine dipstick MODERATE (A) NEGATIVE   Bilirubin Urine NEGATIVE NEGATIVE   Ketones, ur NEGATIVE NEGATIVE mg/dL  Protein, ur NEGATIVE NEGATIVE mg/dL   Nitrite NEGATIVE NEGATIVE   Leukocytes, UA NEGATIVE NEGATIVE   RBC / HPF 0-5 0 - 5 RBC/hpf   WBC, UA 0-5 0 - 5 WBC/hpf   Bacteria, UA NONE SEEN NONE SEEN   Squamous Epithelial / LPF 6-30 (A) NONE SEEN   Mucous PRESENT     MAU Course  Procedures  MDM Labs ordered and reviewed. No evidence of UTI or chorio, only low grade temp at this time. Discussed with patient expectant mngt as this is a very desired pregnancy but infection may develop and then require delivery, pt understands but expresses wishes to give both babies a chance. She also asks about the consequences of low to no fluid around baby A and I discussed this can impact poor lung development and associated morbidity. Strict return precautions. Presentation, clinical findings, and plan discussed with Dr. Macon Large. Stable for discharge home.  Assessment and Plan   1. [redacted] weeks gestation of pregnancy   2. Hyperemesis gravidarum   3. Twin pregnancy with problem affecting pregnancy, antepartum   4. Dichorionic diamniotic twin pregnancy in second trimester   5. Pelvic pressure in pregnancy, antepartum, second trimester   6. Premature rupture of membranes in second trimester, unspecified duration to onset of labor    Discharge home Follow up in office next week as scheduled SAB precautions Notify MD of  fever, chills, pain or cramping Check temp daily   Donette Larry, CNM 08/16/2016, 12:16 PM

## 2016-08-16 NOTE — MAU Note (Signed)
Pt presents to MAU with complaints of pelvic pressure. PT has DI/DI twins, Baby A  Ruptured on march the 13th. Pt is having follow up weekly until viability

## 2016-08-16 NOTE — Progress Notes (Signed)
   PRENATAL VISIT NOTE  Subjective:  Traci Peterson is a 31 y.o. G2P0100 at 3046w4d being seen today for ongoing prenatal care.  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  Patient reports no complaints.  Contractions: Not present. Vag. Bleeding: None.  Movement: Present. Denies leaking of fluid.   The following portions of the patient's history were reviewed and updated as appropriate: allergies, current medications, past family history, past medical history, past social history, past surgical history and problem list. Problem list updated.  Objective:   Vitals:   08/15/16 1114  BP: 111/65  Pulse: (!) 107  Weight: 272 lb (123.4 kg)    Fetal Status: Fetal Heart Rate (bpm): 156/162   Movement: Present     General:  Alert, oriented and cooperative. Patient is in no acute distress.  Skin: Skin is warm and dry. No rash noted.   Cardiovascular: Normal heart rate noted  Respiratory: Normal respiratory effort, no problems with respiration noted  Abdomen: Soft, gravid, appropriate for gestational age. Pain/Pressure: Present     Pelvic:  Cervical exam deferred        Extremities: Normal range of motion.  Edema: Trace  Mental Status: Normal mood and affect. Normal behavior. Normal judgment and thought content.   Assessment and Plan:  Pregnancy: G2P0100 at 4546w4d  1. Twin pregnancy with problem affecting pregnancy, antepartum MFM  scanning twins for growth.  Twin A (ruptured) is hard to see for anatomy.  Growth and evaluation already scheduled with MFM.  No evidence of PTL.    2. Preterm premature rupture of membranes (PPROM) with unknown onset of labor -no evidence of infection -continue vaginal rest -infection signs and symptoms reviewed with patient -plan to admit at 23 weeks for steroids and antibiotics  3.  Obesity -passed 2 hour GTT -2 hour pp today normal. -Plan to re-screen at 26-28 weeks  Preterm labor symptoms and general obstetric precautions including but not limited to vaginal bleeding, contractions, leaking of fluid and fetal movement were reviewed in detail with the patient. Please refer to After Visit Summary for other counseling recommendations.  Return in about 1 week (around 08/22/2016).   Lesly DukesKelly H Lisia Westbay, MD

## 2016-08-22 ENCOUNTER — Ambulatory Visit (HOSPITAL_COMMUNITY)
Admission: RE | Admit: 2016-08-22 | Discharge: 2016-08-22 | Disposition: A | Discharge status: Home / Self Care | Payer: Medicaid Other | Source: Ambulatory Visit | Attending: Obstetrics & Gynecology | Admitting: Obstetrics & Gynecology

## 2016-08-22 ENCOUNTER — Ambulatory Visit (INDEPENDENT_AMBULATORY_CARE_PROVIDER_SITE_OTHER): Payer: Medicaid Other | Admitting: Obstetrics & Gynecology

## 2016-08-22 VITALS — BP 130/60 | HR 107 | Wt 274.0 lb

## 2016-08-22 DIAGNOSIS — O42912 Preterm premature rupture of membranes, unspecified as to length of time between rupture and onset of labor, second trimester: Secondary | ICD-10-CM | POA: Diagnosis not present

## 2016-08-22 DIAGNOSIS — O3680X1 Pregnancy with inconclusive fetal viability, fetus 1: Secondary | ICD-10-CM

## 2016-08-22 DIAGNOSIS — O30042 Twin pregnancy, dichorionic/diamniotic, second trimester: Secondary | ICD-10-CM | POA: Diagnosis not present

## 2016-08-22 DIAGNOSIS — O30009 Twin pregnancy, unspecified number of placenta and unspecified number of amniotic sacs, unspecified trimester: Secondary | ICD-10-CM

## 2016-08-22 DIAGNOSIS — O4292 Full-term premature rupture of membranes, unspecified as to length of time between rupture and onset of labor: Secondary | ICD-10-CM | POA: Insufficient documentation

## 2016-08-22 DIAGNOSIS — O99212 Obesity complicating pregnancy, second trimester: Secondary | ICD-10-CM | POA: Diagnosis not present

## 2016-08-22 DIAGNOSIS — Z3A20 20 weeks gestation of pregnancy: Secondary | ICD-10-CM | POA: Diagnosis not present

## 2016-08-22 DIAGNOSIS — O42919 Preterm premature rupture of membranes, unspecified as to length of time between rupture and onset of labor, unspecified trimester: Secondary | ICD-10-CM

## 2016-08-22 DIAGNOSIS — O09212 Supervision of pregnancy with history of pre-term labor, second trimester: Secondary | ICD-10-CM | POA: Insufficient documentation

## 2016-08-22 DIAGNOSIS — O30002 Twin pregnancy, unspecified number of placenta and unspecified number of amniotic sacs, second trimester: Secondary | ICD-10-CM

## 2016-08-22 NOTE — Progress Notes (Signed)
   PRENATAL VISIT NOTE  Subjective:  Traci Peterson is a 31 y.o. G2P0100 at 4735w0d being seen today for ongoing prenatal care.  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  Patient reports no contractions.  Contractions: Not present. Vag. Bleeding: None.  Movement: Present. Denies leaking of fluid.   The following portions of the patient's history were reviewed and updated as appropriate: allergies, current medications, past family history, past medical history, past social history, past surgical history and problem list. Problem list updated.  Objective:   Vitals:   08/22/16 1323  BP: 130/60  Pulse: (!) 107  Weight: 274 lb (124.3 kg)    Fetal Status: Fetal Heart Rate (bpm): 149/157   Movement: Present     General:  Alert, oriented and cooperative. Patient is in no acute distress.  Skin: Skin is warm and dry. No rash noted.   Cardiovascular: Normal heart rate noted  Respiratory: Normal respiratory effort, no problems with respiration noted  Abdomen: Soft, gravid, appropriate for gestational age. Pain/Pressure: Present     Pelvic:  Cervical exam deferred        Extremities: Normal range of motion.  Edema: Trace  Mental Status: Normal mood and affect. Normal behavior. Normal judgment and thought content.   Assessment and Plan:  Pregnancy: G2P0100 at 8535w0d  1. Twin pregnancy with problem affecting pregnancy, antepartum MFM  scanning twins for growth.  Twin A is Anhydramnios .  Growth and evaluation already scheduled with MFM.  No evidence of PTL.    2. Preterm premature rupture of membranes (PPROM) with unknown onset of labor -no evidence of infection -continue vaginal rest -infection signs and symptoms reviewed with patient -plan to admit at 23 weeks for steroids and antibiotics  3.  Obesity -passed 2 hour GTT -Plan to re-screen at 26-28 weeks  . Preterm labor symptoms and general obstetric precautions including but not limited to vaginal bleeding, contractions, leaking of fluid and fetal movement were reviewed in detail with the patient. Please refer to After Visit Summary for other counseling recommendations.  Return in about 1 week (around 08/29/2016).   Tereasa CoopHannah Dwanda Tufano, RN

## 2016-08-23 ENCOUNTER — Encounter (HOSPITAL_COMMUNITY): Payer: Self-pay | Admitting: *Deleted

## 2016-08-23 ENCOUNTER — Inpatient Hospital Stay (HOSPITAL_COMMUNITY)
Admission: AD | Admit: 2016-08-23 | Discharge: 2016-08-24 | Disposition: A | Discharge status: Home / Self Care | Payer: Medicaid Other | Source: Ambulatory Visit | Attending: Obstetrics and Gynecology | Admitting: Obstetrics and Gynecology

## 2016-08-23 ENCOUNTER — Other Ambulatory Visit (HOSPITAL_COMMUNITY): Payer: Self-pay | Admitting: *Deleted

## 2016-08-23 DIAGNOSIS — O30002 Twin pregnancy, unspecified number of placenta and unspecified number of amniotic sacs, second trimester: Secondary | ICD-10-CM | POA: Diagnosis not present

## 2016-08-23 DIAGNOSIS — O30009 Twin pregnancy, unspecified number of placenta and unspecified number of amniotic sacs, unspecified trimester: Secondary | ICD-10-CM

## 2016-08-23 DIAGNOSIS — Z801 Family history of malignant neoplasm of trachea, bronchus and lung: Secondary | ICD-10-CM | POA: Insufficient documentation

## 2016-08-23 DIAGNOSIS — O99612 Diseases of the digestive system complicating pregnancy, second trimester: Secondary | ICD-10-CM | POA: Diagnosis not present

## 2016-08-23 DIAGNOSIS — O3482 Maternal care for other abnormalities of pelvic organs, second trimester: Secondary | ICD-10-CM | POA: Insufficient documentation

## 2016-08-23 DIAGNOSIS — Z79899 Other long term (current) drug therapy: Secondary | ICD-10-CM | POA: Insufficient documentation

## 2016-08-23 DIAGNOSIS — Z803 Family history of malignant neoplasm of breast: Secondary | ICD-10-CM | POA: Diagnosis not present

## 2016-08-23 DIAGNOSIS — O219 Vomiting of pregnancy, unspecified: Secondary | ICD-10-CM | POA: Diagnosis not present

## 2016-08-23 DIAGNOSIS — O99342 Other mental disorders complicating pregnancy, second trimester: Secondary | ICD-10-CM | POA: Diagnosis not present

## 2016-08-23 DIAGNOSIS — Z8249 Family history of ischemic heart disease and other diseases of the circulatory system: Secondary | ICD-10-CM | POA: Insufficient documentation

## 2016-08-23 DIAGNOSIS — Z3A20 20 weeks gestation of pregnancy: Secondary | ICD-10-CM | POA: Insufficient documentation

## 2016-08-23 DIAGNOSIS — Z87891 Personal history of nicotine dependence: Secondary | ICD-10-CM | POA: Insufficient documentation

## 2016-08-23 DIAGNOSIS — Z3A21 21 weeks gestation of pregnancy: Secondary | ICD-10-CM

## 2016-08-23 DIAGNOSIS — Z9889 Other specified postprocedural states: Secondary | ICD-10-CM | POA: Diagnosis not present

## 2016-08-23 DIAGNOSIS — O42919 Preterm premature rupture of membranes, unspecified as to length of time between rupture and onset of labor, unspecified trimester: Secondary | ICD-10-CM

## 2016-08-23 DIAGNOSIS — O99282 Endocrine, nutritional and metabolic diseases complicating pregnancy, second trimester: Secondary | ICD-10-CM | POA: Diagnosis not present

## 2016-08-23 DIAGNOSIS — Z9049 Acquired absence of other specified parts of digestive tract: Secondary | ICD-10-CM | POA: Diagnosis not present

## 2016-08-23 DIAGNOSIS — Z833 Family history of diabetes mellitus: Secondary | ICD-10-CM | POA: Insufficient documentation

## 2016-08-23 DIAGNOSIS — O210 Mild hyperemesis gravidarum: Secondary | ICD-10-CM | POA: Diagnosis present

## 2016-08-23 DIAGNOSIS — O30042 Twin pregnancy, dichorionic/diamniotic, second trimester: Secondary | ICD-10-CM

## 2016-08-23 LAB — URINALYSIS, ROUTINE W REFLEX MICROSCOPIC
Bilirubin Urine: NEGATIVE
GLUCOSE, UA: NEGATIVE mg/dL
KETONES UR: 80 mg/dL — AB
Nitrite: NEGATIVE
PROTEIN: 100 mg/dL — AB
Specific Gravity, Urine: 1.029 (ref 1.005–1.030)
pH: 5 (ref 5.0–8.0)

## 2016-08-23 MED ORDER — LACTATED RINGERS IV BOLUS (SEPSIS): 1000.0000 mL | Freq: Once | INTRAVENOUS | Status: DC

## 2016-08-23 MED ORDER — DEXTROSE 5 % IN LACTATED RINGERS IV BOLUS
1000.0000 mL | Freq: Once | INTRAVENOUS | Status: AC
  Administered 2016-08-23: 1000 mL via INTRAVENOUS

## 2016-08-23 MED ORDER — PROMETHAZINE HCL 25 MG/ML IJ SOLN
25.0000 mg | Freq: Once | INTRAVENOUS | Status: AC
  Administered 2016-08-23: 25 mg via INTRAVENOUS
  Filled 2016-08-23: qty 1

## 2016-08-23 NOTE — Discharge Instructions (Signed)

## 2016-08-23 NOTE — MAU Note (Signed)
PT SAYS SHE HAD GALLBLADDER REMOVED ON   06-2016.    HAS HAD N/V  ALL PREG.  HAS  TWINS.    TAKES DICLEGIS AT NIGHT .    TOOK ZOFRAN 1 WEEK AGO.    PHENERGAN - TOOK 1 WEEK AGO       TODAY  VOMITED - DID NOT TAKE  MEDS-      SAYS GETS CONSTIPATED.      LAST ATE- 10AM- [facility].      LAST DRANK 10-    VIT  WATER.

## 2016-08-23 NOTE — MAU Provider Note (Signed)
Chief Complaint: Emesis   First Provider Initiated Contact with Patient 08/23/16 2149     SUBJECTIVE HPI: Traci Peterson is a 31 y.o. G2P0100 at 5769w1d who presents to Maternity Admissions reporting exacerbation of N/V of pregnancy. Has had N/V throughout pregnancy, improved and cholecystectomy at 15 weeks, but worsened since yesterday.   Duration: 2 days Context: Pregnancy Modifying factors: Couldn't take PO phenergan 2/2 severity of N/V and couldn't use phenergan suppositories 2/2 loose stools (that she has sometimes since cholecystectomy). Zofran cause constipation.   Associated signs and symptoms: Neg for fever, chills, malaise, watery or bloody stools, or sick contacts.   Has known PPPROM of baby A. Denies fever, abd tenderness, VB, purulent discharge. Continue to leak.   Past Medical History:  Diagnosis Date  . Anxiety    no current med.  . Chronic headaches   . Gall stones   . HPV in female   . Jaw snapping    states jaw pops if opens mouth too wide  . Ovarian cyst   . PCOS (polycystic ovarian syndrome)   . Pilonidal cyst 02/2013  . Preterm labor 2016   PPROM @ 18 wks, delivery of IUFD @ 21 wks  . Vaginal Pap smear, abnormal    OB History  Gravida Para Term Preterm AB Living  2 1   1    0  SAB TAB Ectopic Multiple Live Births        0      # Outcome Date GA Lbr Len/2nd Weight Sex Delivery Anes PTL Lv  2 Current           1 Preterm 11/12/14 722w1d  13.2 oz (0.374 kg) M Vag-Spont None  FD     Past Surgical History:  Procedure Laterality Date  . CHOLECYSTECTOMY N/A 07/21/2016   Procedure: LAPAROSCOPIC CHOLECYSTECTOMY;  Surgeon: Abigail Miyamotoouglas Blackman, MD;  [facility];  Service: General;  Laterality: N/A;  . PILONIDAL CYST EXCISION N/A 03/12/2013   Procedure: CYST EXCISION PILONIDAL EXTENSIVE;  Surgeon: Shelly Rubensteinouglas A Blackman, MD;  Location: [facility];  Service: General;  Laterality: N/A;  . WISDOM TOOTH EXTRACTION     Social History   Social History   . Marital status: Married    Spouse name: N/A  . Number of children: N/A  . Years of education: N/A   Occupational History  . CMA Other    CNA   Social History Main Topics  . Smoking status: Former Smoker    Packs/day: 0.50    Years: 6.00    Types: Cigarettes    Quit date: 03/28/2016  . Smokeless tobacco: Former NeurosurgeonUser     Comment: 3-5 cig./day  . Alcohol use No     Comment: socially  . Drug use: No  . Sexual activity: Yes   Other Topics Concern  . Not on file   Social History Narrative  . No narrative on file   Family History  Problem Relation Age of Onset  . Hypertension Mother   . Hyperlipidemia Mother   . Diabetes Mother   . Thyroid disease Mother   . Diabetes Father   . Hypertension Father   . Alcohol abuse Father   . Breast cancer Maternal Aunt   . Lung cancer Paternal Grandfather    No current facility-administered medications on file prior to encounter.    Current Outpatient Prescriptions on File Prior to Encounter  Medication Sig Dispense Refill  . docusate sodium (COLACE) 100 MG capsule Take 1 capsule (100 mg  total) by mouth 2 (two) times daily as needed. (Patient taking differently: Take 100 mg by mouth 2 (two) times daily as needed for moderate constipation. ) 30 capsule 2  . Doxylamine-Pyridoxine (DICLEGIS) 10-10 MG TBEC Take 2 tablets by mouth at bedtime. 60 tablet 2  . lansoprazole (PREVACID) 15 MG capsule Take 15 mg by mouth daily at 12 noon.    . metoCLOPramide (REGLAN) 10 MG tablet Take 1 tablet (10 mg total) by mouth every 6 (six) hours as needed for nausea. 30 tablet 1  . polyethylene glycol powder (MIRALAX) powder Take 17 g by mouth daily. 255 g 0  . Prenatal Vit-Fe Fumarate-FA (PRENATAL MULTIVITAMIN) TABS tablet Take 1 tablet by mouth every morning.     . promethazine (PHENERGAN) 25 MG suppository Place 1 suppository (25 mg total) rectally every 6 (six) hours as needed for nausea or vomiting. 30 suppository 1   No Known Allergies  I have  reviewed patient's Past Medical Hx, Surgical Hx, Family Hx, Social Hx, medications and allergies.   Review of Systems  Constitutional: Negative for appetite change, chills, fatigue and fever.  Gastrointestinal: Positive for nausea and vomiting. Negative for abdominal pain, blood in stool, constipation and diarrhea.  Genitourinary: Negative for dysuria, flank pain, pelvic pain and vaginal bleeding.       Pos LOF    OBJECTIVE Patient Vitals for the past 24 hrs:  BP Temp Temp src Pulse Resp Height Weight  08/23/16 2124 (!) 124/57 98.1 F (36.7 C) Oral (!) 108 18 5\' 2"  (1.575 m) 267 lb (121.1 kg)  08/23/16 2056 113/68 99.1 F (37.3 C) Oral (!) 108 18 5\' 2"  (1.575 m) 267 lb 8 oz (121.3 kg)   Constitutional: Well-developed, well-nourished female in no acute distress.  Cardiovascular: mild tachycardia Respiratory: normal rate and effort.  GI: Abd soft, non-tender, gravid appropriate for gestational age w/ twins. Neurologic: Alert and oriented x 4.  GU: Neg CVAT.  SPECULUM EXAM: Deferred  Pos FHR x 2  LAB RESULTS Results for orders placed or performed during the hospital encounter of 08/23/16 (from the past 24 hour(s))  Urinalysis, Routine w reflex microscopic     Status: Abnormal   Collection Time: 08/23/16  8:58 PM  Result Value Ref Range   Color, Urine YELLOW YELLOW   APPearance HAZY (A) CLEAR   Specific Gravity, Urine 1.029 1.005 - 1.030   pH 5.0 5.0 - 8.0   Glucose, UA NEGATIVE NEGATIVE mg/dL   Hgb urine dipstick SMALL (A) NEGATIVE   Bilirubin Urine NEGATIVE NEGATIVE   Ketones, ur 80 (A) NEGATIVE mg/dL   Protein, ur 161 (A) NEGATIVE mg/dL   Nitrite NEGATIVE NEGATIVE   Leukocytes, UA MODERATE (A) NEGATIVE   RBC / HPF 6-30 0 - 5 RBC/hpf   WBC, UA 6-30 0 - 5 WBC/hpf   Bacteria, UA RARE (A) NONE SEEN   Squamous Epithelial / LPF 0-5 (A) NONE SEEN   Mucous PRESENT     IMAGING NA  MAU COURSE Orders Placed This Encounter  Procedures  . Urinalysis, Routine w reflex  microscopic   Meds ordered this encounter  Medications  . promethazine (PHENERGAN) 25 mg in lactated ringers 1,000 mL infusion  . dextrose 5% lactated ringers bolus 1,000 mL    MDM - Exacerbation of N/V of pregnancy.  Improved w/ IV fluids and phenergan. Thinks she will be able to use the phenergan she has at home.  - PPPROM w/out evidence of infection or PTL.  Bedside US:+FHT of  Both fetuses   ASSESSMENT 1. Hyperemesis gravidarum   2. Twin pregnancy with problem affecting pregnancy, antepartum   3       PPPROM Twin A-stable  PLAN Discharge home in stable condition. N/V precautions PPPROM precautions.   Follow-up Information    [facility] [place] Follow up on 08/29/2016.   Specialty:  Obstetrics and Gynecology Why:  as scheduled or sooner as needed if symptoms worsen Contact information: [address]  [address] [place] [address] [phone]       [facility] [place] [facility]ERN[facility] ADMISSIONS Follow up.   Why:  in emergencies Contact information: [address] [id number] mc [place] [address] [phone]          Allergies as of 08/23/2016   No Known Allergies     Medication List    TAKE these medications   docusate sodium 100 MG capsule Commonly known as:  COLACE Take 1 capsule (100 mg total) by mouth 2 (two) times daily as needed. What changed:  reasons to take this   Doxylamine-Pyridoxine 10-10 MG Tbec Commonly known as:  DICLEGIS Take 2 tablets by mouth at bedtime.   lansoprazole 15 MG capsule Commonly known as:  PREVACID Take 15 mg by mouth daily at 12 noon.   metoCLOPramide 10 MG tablet Commonly known as:  REGLAN Take 1 tablet (10 mg total) by mouth every 6 (six) hours as needed for nausea.   polyethylene glycol powder powder Commonly known as:  MIRALAX Take 17 g by mouth daily.   prenatal multivitamin Tabs tablet Take 1 tablet by mouth every morning.    promethazine 25 MG suppository Commonly known as:  PHENERGAN Place 1 suppository (25 mg total) rectally every 6 (six) hours as needed for nausea or vomiting.        [place], [place] 08/23/2016  10:47 PM

## 2016-08-23 NOTE — MAU Note (Signed)
Pt. Here with c/o vomiting , vomiting started around 11:00 this morning.  Pt. Also states she has not been taking her zofran because it causes constipation. Pt. Also states she SROM at 18weeks with Twin A, Twin B membrane intact.  Doppler Twin A - 166, Twin B 164. Last sexual encounter was three weeks ago, denies vaginal bleeding, positive for fetal movement (per patient).

## 2016-08-25 ENCOUNTER — Inpatient Hospital Stay (HOSPITAL_COMMUNITY)
Admission: AD | Admit: 2016-08-25 | Discharge: 2016-08-25 | Disposition: A | Discharge status: Home / Self Care | Payer: Medicaid Other | Source: Ambulatory Visit | Attending: Obstetrics & Gynecology | Admitting: Obstetrics & Gynecology

## 2016-08-25 ENCOUNTER — Encounter (HOSPITAL_COMMUNITY): Payer: Self-pay | Admitting: *Deleted

## 2016-08-25 DIAGNOSIS — Z8249 Family history of ischemic heart disease and other diseases of the circulatory system: Secondary | ICD-10-CM | POA: Diagnosis not present

## 2016-08-25 DIAGNOSIS — Z801 Family history of malignant neoplasm of trachea, bronchus and lung: Secondary | ICD-10-CM | POA: Diagnosis not present

## 2016-08-25 DIAGNOSIS — Z833 Family history of diabetes mellitus: Secondary | ICD-10-CM | POA: Insufficient documentation

## 2016-08-25 DIAGNOSIS — R109 Unspecified abdominal pain: Secondary | ICD-10-CM | POA: Diagnosis present

## 2016-08-25 DIAGNOSIS — R51 Headache: Secondary | ICD-10-CM | POA: Diagnosis not present

## 2016-08-25 DIAGNOSIS — O30002 Twin pregnancy, unspecified number of placenta and unspecified number of amniotic sacs, second trimester: Secondary | ICD-10-CM | POA: Diagnosis not present

## 2016-08-25 DIAGNOSIS — Z811 Family history of alcohol abuse and dependence: Secondary | ICD-10-CM | POA: Insufficient documentation

## 2016-08-25 DIAGNOSIS — Z9889 Other specified postprocedural states: Secondary | ICD-10-CM | POA: Insufficient documentation

## 2016-08-25 DIAGNOSIS — O99282 Endocrine, nutritional and metabolic diseases complicating pregnancy, second trimester: Secondary | ICD-10-CM | POA: Insufficient documentation

## 2016-08-25 DIAGNOSIS — O3482 Maternal care for other abnormalities of pelvic organs, second trimester: Secondary | ICD-10-CM | POA: Insufficient documentation

## 2016-08-25 DIAGNOSIS — O30009 Twin pregnancy, unspecified number of placenta and unspecified number of amniotic sacs, unspecified trimester: Secondary | ICD-10-CM

## 2016-08-25 DIAGNOSIS — O210 Mild hyperemesis gravidarum: Secondary | ICD-10-CM | POA: Insufficient documentation

## 2016-08-25 DIAGNOSIS — O99712 Diseases of the skin and subcutaneous tissue complicating pregnancy, second trimester: Secondary | ICD-10-CM | POA: Diagnosis not present

## 2016-08-25 DIAGNOSIS — O99342 Other mental disorders complicating pregnancy, second trimester: Secondary | ICD-10-CM | POA: Diagnosis not present

## 2016-08-25 DIAGNOSIS — Z79899 Other long term (current) drug therapy: Secondary | ICD-10-CM | POA: Insufficient documentation

## 2016-08-25 DIAGNOSIS — O99212 Obesity complicating pregnancy, second trimester: Secondary | ICD-10-CM | POA: Diagnosis not present

## 2016-08-25 DIAGNOSIS — O98812 Other maternal infectious and parasitic diseases complicating pregnancy, second trimester: Secondary | ICD-10-CM | POA: Diagnosis not present

## 2016-08-25 DIAGNOSIS — O26892 Other specified pregnancy related conditions, second trimester: Secondary | ICD-10-CM | POA: Insufficient documentation

## 2016-08-25 DIAGNOSIS — D510 Vitamin B12 deficiency anemia due to intrinsic factor deficiency: Secondary | ICD-10-CM | POA: Insufficient documentation

## 2016-08-25 DIAGNOSIS — Z3A20 20 weeks gestation of pregnancy: Secondary | ICD-10-CM | POA: Diagnosis not present

## 2016-08-25 DIAGNOSIS — Z87891 Personal history of nicotine dependence: Secondary | ICD-10-CM | POA: Insufficient documentation

## 2016-08-25 DIAGNOSIS — Z888 Allergy status to other drugs, medicaments and biological substances status: Secondary | ICD-10-CM | POA: Diagnosis not present

## 2016-08-25 DIAGNOSIS — R0603 Acute respiratory distress: Secondary | ICD-10-CM | POA: Insufficient documentation

## 2016-08-25 DIAGNOSIS — Z9049 Acquired absence of other specified parts of digestive tract: Secondary | ICD-10-CM | POA: Insufficient documentation

## 2016-08-25 DIAGNOSIS — Z803 Family history of malignant neoplasm of breast: Secondary | ICD-10-CM | POA: Diagnosis not present

## 2016-08-25 LAB — CBC WITH DIFFERENTIAL/PLATELET
BASOS ABS: 0 10*3/uL (ref 0.0–0.1)
BASOS PCT: 0 %
Eosinophils Absolute: 0.2 10*3/uL (ref 0.0–0.7)
Eosinophils Relative: 2 %
HEMATOCRIT: 32.7 % — AB (ref 36.0–46.0)
HEMOGLOBIN: 11.3 g/dL — AB (ref 12.0–15.0)
LYMPHS PCT: 24 %
Lymphs Abs: 2.2 10*3/uL (ref 0.7–4.0)
MCH: 29.8 pg (ref 26.0–34.0)
MCHC: 34.6 g/dL (ref 30.0–36.0)
MCV: 86.3 fL (ref 78.0–100.0)
Monocytes Absolute: 0.4 10*3/uL (ref 0.1–1.0)
Monocytes Relative: 4 %
NEUTROS ABS: 6.7 10*3/uL (ref 1.7–7.7)
NEUTROS PCT: 70 %
Platelets: 241 10*3/uL (ref 150–400)
RBC: 3.79 MIL/uL — ABNORMAL LOW (ref 3.87–5.11)
RDW: 14.8 % (ref 11.5–15.5)
WBC: 9.4 10*3/uL (ref 4.0–10.5)

## 2016-08-25 LAB — URINALYSIS, ROUTINE W REFLEX MICROSCOPIC
Bilirubin Urine: NEGATIVE
GLUCOSE, UA: NEGATIVE mg/dL
KETONES UR: NEGATIVE mg/dL
NITRITE: NEGATIVE
PROTEIN: NEGATIVE mg/dL
Specific Gravity, Urine: 1.01 (ref 1.005–1.030)
pH: 7 (ref 5.0–8.0)

## 2016-08-25 LAB — WET PREP, GENITAL
CLUE CELLS WET PREP: NONE SEEN
Sperm: NONE SEEN
TRICH WET PREP: NONE SEEN
Yeast Wet Prep HPF POC: NONE SEEN

## 2016-08-25 MED ORDER — INDOMETHACIN 50 MG PO CAPS
75.0000 mg | ORAL_CAPSULE | Freq: Once | ORAL | Status: AC
  Administered 2016-08-25: 75 mg via ORAL
  Filled 2016-08-25: qty 1

## 2016-08-25 NOTE — MAU Provider Note (Signed)
Obstetric Attending MAU Note  Chief Complaint:  Abdominal Pain   First Provider Initiated Contact with Patient 08/25/16 1103     HPI: Traci Peterson is a 31 y.o. G2P0100 at [redacted]w[redacted]d who presents to maternity admissions reporting painful irregular contractions; coming about every 20-30 minutes. Patient has known Di/Di twin pregnancy complicated by PPROM of Baby A on 08/07/16.  She has been seen a few times in MAU for abdominal pain and contractions.  She reports small amount of ongoing leaking of clear fluid, no bleeding.  Reports feeling fetal movement.  Denies any fevers, chills, sweats, dysuria, nausea, vomiting or other concerns.   Pregnancy Course: Receives care at [facility] Patient Active Problem List   Diagnosis Date Noted  . Gall stones 07/19/2016  . Hyperemesis gravidarum 06/27/2016  . Family history of breast cancer in female 06/06/2016  . Twin pregnancy with problem affecting pregnancy, antepartum 06/06/2016  . Tinea pedis, recurrent 08/08/2015  . Pernicious anemia 08/08/2015  . Preterm premature rupture of membranes (PPROM) with unknown onset of labor   . Morbidly obese (HCC) 10/25/2014  . Eczema 10/25/2014  . 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 Chronic headaches -  no meds 10/25/2014  . Anxiety and depression 11/30/2013  . HPV in female 05/08/2013  . PCOS (polycystic ovarian syndrome) 04/20/2013    Past Medical History:  Diagnosis Date  . Anxiety    no current med.  . Chronic headaches   . Gall stones   . HPV in female   . Jaw snapping    states jaw pops if opens mouth too wide  . Ovarian cyst   . PCOS (polycystic ovarian syndrome)   . Pilonidal cyst 02/2013  . Preterm labor 2016   PPROM @ 18 wks, delivery of IUFD @ 21 wks  . Vaginal Pap smear, abnormal     OB History  Gravida Para Term Preterm AB Living  2 1   1    0  SAB TAB Ectopic Multiple Live Births        0      # Outcome  Date GA Lbr Len/2nd Weight Sex Delivery Anes PTL Lv  2 Current           1 Preterm 11/12/14 [redacted]w[redacted]d  13.2 oz (0.374 kg) M Vag-Spont None  FD      Past Surgical History:  Procedure Laterality Date  . CHOLECYSTECTOMY N/A 07/21/2016   Procedure: LAPAROSCOPIC CHOLECYSTECTOMY;  Surgeon: Abigail Miyamoto, MD;  Location[facility] OR;  Service: General;  Laterality: N/A;  . PILONIDAL CYST EXCISION N/A 03/12/2013   Procedure: CYST EXCISION PILONIDAL EXTENSIVE;  Surgeon: Shelly Rubenstein, MD;  Location: [facility];  Service: General;  Laterality: N/A;  . WISDOM TOOTH EXTRACTION      Family History: Family History  Problem Relation Age of Onset  . Hypertension Mother   . Hyperlipidemia Mother   . Diabetes Mother   . Thyroid disease Mother   . Diabetes Father   . Hypertension Father   . Alcohol abuse Father   . Breast cancer Maternal Aunt   . Lung cancer Paternal Grandfather     Social History: Social History  Substance Use Topics  . Smoking status: Former Smoker    Packs/day: 0.50    Years: 6.00    Types: Cigarettes    Quit date: 03/28/2016  . Smokeless tobacco: Former Neurosurgeon     Comment: 3-5 cig./day  .  Alcohol use No     Comment: socially    Allergies:  Allergies  Allergen Reactions  . Lactaid [Lactase] Diarrhea    Facility-Administered Medications Prior to Admission  Medication Dose Route Frequency Provider Last Rate Last Dose  . hydroxyprogesterone caproate (MAKENA) 250 mg/mL injection 250 mg  250 mg Intramuscular Weekly Lesly Dukes, MD   250 mg at 08/22/16 1334   Prescriptions Prior to Admission  Medication Sig Dispense Refill Last Dose  . docusate sodium (COLACE) 100 MG capsule Take 1 capsule (100 mg total) by mouth 2 (two) times daily as needed. (Patient taking differently: Take 100 mg by mouth 2 (two) times daily as needed for moderate constipation. ) 30 capsule 2 Past Month at Unknown time  . Doxylamine-Pyridoxine (DICLEGIS) 10-10 MG TBEC Take 2 tablets  by mouth at bedtime. 60 tablet 2 08/24/2016 at Unknown time  . lansoprazole (PREVACID) 15 MG capsule Take 15 mg by mouth daily at 12 noon.   08/24/2016 at Unknown time  . metoCLOPramide (REGLAN) 10 MG tablet Take 1 tablet (10 mg total) by mouth every 6 (six) hours as needed for nausea. 30 tablet 1 Past Month at Unknown time  . polyethylene glycol powder (MIRALAX) powder Take 17 g by mouth daily. 255 g 0 Past Month at prn  . Prenatal Vit-Fe Fumarate-FA (PRENATAL MULTIVITAMIN) TABS tablet Take 1 tablet by mouth every morning.    Past Week at Unknown time  . promethazine (PHENERGAN) 25 MG suppository Place 1 suppository (25 mg total) rectally every 6 (six) hours as needed for nausea or vomiting. 30 suppository 1 Past Month at prn    ROS: Pertinent findings in history of present illness.  Physical Exam  Blood pressure 108/60, pulse 94, temperature 98.6 F (37 C), temperature source Axillary, resp. rate 18, last menstrual period 01/13/2016, SpO2 99 %. CONSTITUTIONAL: Well-developed, well-nourished female in no acute distress.  HENT:  Normocephalic, atraumatic, External right and left ear normal. Oropharynx is clear and moist EYES: Conjunctivae and EOM are normal. Pupils are equal, round, and reactive to light. No scleral icterus.  NECK: Normal range of motion, supple, no masses SKIN: Skin is warm and dry. No rash noted. Not diaphoretic. No erythema. No pallor. NEUROLGIC: Alert and oriented to person, place, and time. Normal reflexes, muscle tone coordination. No cranial nerve deficit noted. PSYCHIATRIC: Normal mood and affect. Normal behavior. Normal judgment and thought content. CARDIOVASCULAR: Normal heart rate noted, regular rhythm RESPIRATORY: Effort and breath sounds normal, no problems with respiration noted ABDOMEN: Soft, nontender, nondistended, gravid appropriate for gestational age MUSCULOSKELETAL: Normal range of motion. No edema and no tenderness. 2+ distal pulses.  SPECULUM EXAM: NEFG,  physiologic discharge, no blood, cervix closed.  Mucus discharge noted in cervix. No membranes or fetal parts seen.  Dilation: Closed Exam by:: Dr. Macon Large FHR:  157 bpm for A and 177 for B   Labs: Results for orders placed or performed during the hospital encounter of 08/25/16 (from the past 24 hour(s))  Urinalysis, Routine w reflex microscopic     Status: Abnormal   Collection Time: 08/25/16 10:40 AM  Result Value Ref Range   Color, Urine YELLOW YELLOW   APPearance CLOUDY (A) CLEAR   Specific Gravity, Urine 1.010 1.005 - 1.030   pH 7.0 5.0 - 8.0   Glucose, UA NEGATIVE NEGATIVE mg/dL   Hgb urine dipstick SMALL (A) NEGATIVE   Bilirubin Urine NEGATIVE NEGATIVE   Ketones, ur NEGATIVE NEGATIVE mg/dL   Protein, ur NEGATIVE NEGATIVE mg/dL  Nitrite NEGATIVE NEGATIVE   Leukocytes, UA LARGE (A) NEGATIVE   RBC / HPF 0-5 0 - 5 RBC/hpf   WBC, UA 6-30 0 - 5 WBC/hpf   Bacteria, UA RARE (A) NONE SEEN   Squamous Epithelial / LPF 6-30 (A) NONE SEEN   WBC Clumps PRESENT    Mucous PRESENT   CBC with Differential/Platelet     Status: Abnormal   Collection Time: 08/25/16 11:03 AM  Result Value Ref Range   WBC 9.4 4.0 - 10.5 K/uL   RBC 3.79 (L) 3.87 - 5.11 MIL/uL   Hemoglobin 11.3 (L) 12.0 - 15.0 g/dL   HCT 16.1 (L) 09.6 - 04.5 %   MCV 86.3 78.0 - 100.0 fL   MCH 29.8 26.0 - 34.0 pg   MCHC 34.6 30.0 - 36.0 g/dL   RDW 40.9 81.1 - 91.4 %   Platelets 241 150 - 400 K/uL   Neutrophils Relative % 70 %   Neutro Abs 6.7 1.7 - 7.7 K/uL   Lymphocytes Relative 24 %   Lymphs Abs 2.2 0.7 - 4.0 K/uL   Monocytes Relative 4 %   Monocytes Absolute 0.4 0.1 - 1.0 K/uL   Eosinophils Relative 2 %   Eosinophils Absolute 0.2 0.0 - 0.7 K/uL   Basophils Relative 0 %   Basophils Absolute 0.0 0.0 - 0.1 K/uL  Wet prep, genital     Status: Abnormal   Collection Time: 08/25/16 11:14 AM  Result Value Ref Range   Yeast Wet Prep HPF POC NONE SEEN NONE SEEN   Trich, Wet Prep NONE SEEN NONE SEEN   Clue Cells Wet Prep  HPF POC NONE SEEN NONE SEEN   WBC, Wet Prep HPF POC FEW (A) NONE SEEN   Sperm NONE SEEN     Imaging:  [place] Mfm Ob Detail +14 Wk  Result Date: 08/09/2016 ----------------------------------------------------------------------  OBSTETRICS REPORT                        (Corrected Final 08/09/2016 09:09                                                                          am) ---------------------------------------------------------------------- Patient Info  ID #:       [id number]                          D.O.B.:  [birth year] (30 yrs)  Name:       Traci Peterson               Visit Date: 08/08/2016 02:27 pm ---------------------------------------------------------------------- Performed By  Performed By:     Earley Brooke     Secondary Phy.:   Geralynn Rile, RDMS                                                             [facility]                                                             [facility]  Attending:        Candis Shine        Address:          [address]                    MD                                                             [place], [place]  Referred By:      Fredrich Romans                Location:         [facility]                    LEGGETT MD  Ref. Address:     [address]                    [place], [place]                    [address] ---------------------------------------------------------------------- Orders   #  Description                                 Code   1  [place] MFM OB DETAIL +14 WK                     60454.09  ----------------------------------------------------------------------   #  Ordered By               Order #        Accession #    Episode #   1  Alpha Gula            [id number]      [id number]     [id number]  ---------------------------------------------------------------------- Indications   [redacted] weeks gestation of pregnancy                 Z3A.18   Obesity complicating pregnancy, second         O99.212   trimester   Poor obstetric history: Previous preterm       O09.219   delivery, antepartum (21 weeks)   Twin pregnancy, di/di, second trimester        O30.042   Premature rupture of membranes - leaking       O42.90   fluid   Vaginal bleeding in pregnancy, second          O46.92   trimester  ---------------------------------------------------------------------- OB History  Blood Type:            Height:  5'2"   Weight (lb):  270       BMI:  49.38  Gravidity:  2         Prem:   1  Living:       0 ---------------------------------------------------------------------- Fetal Evaluation (Fetus A)  Num Of Fetuses:     2  Fetal Heart         159  Rate(bpm):  Cardiac Activity:   Observed  Fetal Lie:          Lower Fetus  Presentation:       Shoulder  Placenta:           Posterior, above cervical os  P. Cord Insertion:  Not well visualized  Amniotic Fluid  AFI FV:      Anhydramnios secondary to ROM ---------------------------------------------------------------------- Biometry (Fetus A)  FL:       25.7  mm     G. Age:  17w 6d         38  % ---------------------------------------------------------------------- Gestational Age (Fetus A)  U/S Today:     17w 6d                                        EDD:   01/10/17  Best:          Mackie Pai 0d     Det. By:  Previous Ultrasound      EDD:   01/09/17                                      (05/30/16) ---------------------------------------------------------------------- Anatomy (Fetus A)  Other:  Extremely limited exam due to low amniotic fluid. Technically difficult          due to maternal habitus. ---------------------------------------------------------------------- Fetal Evaluation (Fetus B)  Num Of Fetuses:     2  Fetal Heart         168  Rate(bpm):  Cardiac Activity:   Observed  Fetal Lie:          Upper Fetus  Presentation:       Cephalic  Placenta:           Posterior, above cervical os  P. Cord Insertion:   Visualized  Amniotic Fluid  AFI FV:      Subjectively within normal limits                              Largest Pocket(cm)                              4.8 ---------------------------------------------------------------------- Biometry (Fetus B)  BPD:        42  mm     G. Age:  18w 5d         80  %    CI:        73.79   %    70 - 86                                                          FL/HC:      16.2   %    15.8 - 18  HC:  155.3  mm     G. Age:  18w 3d         65  %    HC/AC:      1.17        1.07 - 1.29  AC:      133.2  mm     G. Age:  18w 6d         74  %    FL/BPD:     59.8   %  FL:       25.1  mm     G. Age:  17w 4d         30  %    FL/AC:      18.8   %    20 - 24  HUM:      25.6  mm     G. Age:  18w 0d         57  %  CER:      18.4  mm     G. Age:  18w 1d         56  %  NFT:       3.1  mm  CM:        5.2  mm  Est. FW:     232  gm      0 lb 8 oz     54  %     FW Discordancy      0 \ 0 % ---------------------------------------------------------------------- Gestational Age (Fetus B)  U/S Today:     18w 3d                                        EDD:   01/06/17  Best:          18w 0d     Det. By:  Previous Ultrasound      EDD:   01/09/17                                      (05/30/16) ---------------------------------------------------------------------- Anatomy (Fetus B)  Cranium:               Appears normal         LVOT:                   Not well visualized  Cavum:                 Appears normal         Aortic Arch:            Not well visualized  Ventricles:            Appears normal         Ductal Arch:            Not well visualized  Choroid Plexus:        Appears normal         Diaphragm:              Appears normal  Cerebellum:            Appears normal         Stomach:                Appears normal, left  sided  Posterior Fossa:       Appears normal         Abdomen:                Appears normal  Nuchal Fold:           Appears  normal         Abdominal Wall:         Appears nml (cord                                                                        insert, abd wall)  Face:                  Orbits appear          Cord Vessels:           Appears normal (3                         normal                                         vessel cord)  Lips:                  Appears normal         Kidneys:                Previously seen  Palate:                Not well visualized    Bladder:                Previously seen  Thoracic:              Appears normal         Spine:                  Not well visualized  Heart:                 Appears normal         Upper Extremities:      Appears normal                         (4CH, axis, and situs  RVOT:                  Not well visualized    Lower Extremities:      Visualized  Other:  Fetus appears to be a female. Rt heel visualized. ---------------------------------------------------------------------- Cervix Uterus Adnexa  Cervix  Length:            3.1  cm.  Normal appearance by transabdominal scan.  Left Ovary  Within normal limits.  Right Ovary  Within normal limits.  Adnexa:       No abnormality visualized. ---------------------------------------------------------------------- Comments  Had long discussion the the patient and her mother - guarded  prognosis for twin A and likely both twins.  Discussed risk of  infection that would require delivery of both fetuses, risk of  spontaneous labor, risk  of abruption, risk of pulmonary  hypoplasia with Twin A due to anhydamnios.  I reiterated the  remote chance that Twin A would make it to viability ( 23  weeks).  The patient had read up on delayed interval delivery.  We  briefly discussed eventualities should twin A deliver.  There is  a possibility that twin B would also deliver shortly thereafter  and she would not be a candidate for further expectant  management or other intervention.  In the event that the  patient delivers Twin A, would recommend a high  ligation of  the cord (use endo loops) and continued observation - if Twin  B does not deliver after a several hour period of observation,  she may be a candidate for delayed interval delivery  (cerclage).  The patient understands while there have been  case reports of favorable outcomes, many providers would  not be comfortable offering this procedure. Ultimately,  this  would be determined following the delivery of Twin and  availability of providers who would be comfortable offering  this intervention. ---------------------------------------------------------------------- Impression  DC/DA twin gestation at 37w 0d  PROM - twin A yesterday  Twin A:  Anhydramnios noted  Unable to clear any fetal anatomy  Fetal heart activity documented at 159 bpm  Twin B:  Cephalic presentation  Limited views of the fetal heart, spine and face obtained  The remainder of the fetal anatomy appears normal.  The estimated fetal weight is at the 54th %tile  Posterior placenta  Normal amniotic fluid volume ---------------------------------------------------------------------- Recommendations  See comments above  Plan for outpatient expectant managment for now  Weekly visits in clinic - frequent temperature checks at home.  Patient instructed to come to the hospital immediately for  fever (> 100 F), increased bleeding or cramping  Limited ultrasound with MFM in 2 weeks ----------------------------------------------------------------------               Fayne Norrie, BS, RDMS, RVT Electronically Signed Corrected Final Report  08/09/2016 09:09 am ----------------------------------------------------------------------  [place] Mfm Ob Limited  Result Date: 08/22/2016 ----------------------------------------------------------------------  OBSTETRICS REPORT                      (Signed Final 08/22/2016 10:53 am) ---------------------------------------------------------------------- Patient Info  ID #:       [id number]                          D.O.B.:   06-27-1985 (30 yrs)  Name:       Traci Peterson              Visit Date:  08/22/2016 10:15 am ---------------------------------------------------------------------- Performed By  Performed By:     Percell Boston          Secondary Phy.:   [facility]                                                             [facility]  Attending:        Charlsie Merles MD         Address:          [phone] [place]  Referred By:      Fredrich Romans                Location:         [facility]                    LEGGETT MD  Ref. Address:     [address]                    [place], [place]                    [address] ---------------------------------------------------------------------- Orders   #  Description                                 Code   1  [place] MFM OB LIMITED                           40981.19  ----------------------------------------------------------------------   #  Ordered By               Order #        Accession #    Episode #   1  Alpha Gula            [id number]      [id number]     [id number]  ---------------------------------------------------------------------- Indications   [redacted] weeks gestation of pregnancy                Z3A.20   Obesity complicating pregnancy, second         O99.212   trimester   Poor obstetric history: Previous preterm       O09.219   delivery, antepartum (21 weeks)   Twin pregnancy, di/di, second trimester        O30.042   Premature rupture of membranes - leaking       O42.90   fluid (baby A)  ---------------------------------------------------------------------- OB History  Blood Type:            Height:  5'2"   Weight (lb):  270      BMI:   49.38  Gravidity:    2         Prem:   1  Living:       0  ---------------------------------------------------------------------- Fetal Evaluation (Fetus A)  Num Of Fetuses:  2  Fetal Heart         149  Rate(bpm):  Cardiac Activity:   Observed  Fetal Lie:          Lower Fetus  Presentation:       Transverse, head to maternal right  Placenta:           Posterior, above cervical os  P. Cord Insertion:  Not well visualized  Amniotic Fluid  AFI FV:      Oligohydramnios                              Largest Pocket(cm)                              1.0 ---------------------------------------------------------------------- Gestational Age (Fetus A)  Best:          20w 0d    Det. By:   Previous Ultrasound      EDD:   01/09/17                                      (05/30/16) ---------------------------------------------------------------------- Anatomy (Fetus A)  Stomach:               Appears normal, left   Bladder:                Appears normal                         sided ---------------------------------------------------------------------- Fetal Evaluation (Fetus B)  Num Of Fetuses:     2  Fetal Heart         157  Rate(bpm):  Cardiac Activity:   Observed  Fetal Lie:          Upper Fetus  Presentation:       Transverse, head to maternal right  Placenta:           Posterior, above cervical os  P. Cord Insertion:  Not well visualized  Amniotic Fluid  AFI FV:      Subjectively within normal limits                              Largest Pocket(cm)                              5.8 ---------------------------------------------------------------------- Gestational Age (Fetus B)  Best:          20w 0d    Det. By:   Previous Ultrasound      EDD:   01/09/17                                      (05/30/16) ---------------------------------------------------------------------- Anatomy (Fetus B)  Stomach:               Appears normal, left   Bladder:                Appears normal                         sided ---------------------------------------------------------------------- Cervix Uterus  Adnexa  Cervix  Length:  2.7  cm.  Normal appearance by transabdominal scan.  Uterus  No abnormality visualized.  Left Ovary  No adnexal mass visualized.  Right Ovary  No adnexal mass visualized.  Cul De Sac:   No free fluid seen.  Adnexa:       No abnormality visualized. ---------------------------------------------------------------------- Impression  diamniotic dichorionic  twin gestation at 20+0 weeks, with  PPROM of twin A x 2 weeks  Twin A:  Anhydramnios noted  Normal fetal cardiac activity  Twin B:  Cephalic presentation  Normal amniotic fluid volume and cardiac activity ---------------------------------------------------------------------- Recommendations  Weekly visits in clinic - frequent temperature checks at home.  Patient instructed to come to the hospital immediately for  fever (> 100 F), increased bleeding or cramping  Ultrasound with MFM in 2 week ----------------------------------------------------------------------                 Charlsie Merles, MD Electronically Signed Final Report   08/22/2016 10:53 am ----------------------------------------------------------------------  [place] Mfm Ob Limited  Result Date: 08/09/2016 ----------------------------------------------------------------------  OBSTETRICS REPORT                      (Signed Final 08/08/2016 05:00 pm) ---------------------------------------------------------------------- Patient Info  ID #:       [id number]                         D.O.B.:   07-Nov-1985 (30 yrs)  Name:       Traci Peterson              Visit Date:  08/08/2016 02:27 pm ---------------------------------------------------------------------- Performed By  Performed By:     Earley Brooke     Secondary Phy.:   Geralynn Rile, RDMS                                                             [facility]                                                             [facility]                                                             [facility]   Attending:        Candis Shine        Address:          [phone] [address]                    MD  [place], [place]  Referred By:      Fredrich Romans                Location:         [facility]                    LEGGETT MD  Ref. Address:     [address] [place] [address]                    [place], [place]                    [address] ---------------------------------------------------------------------- Orders   #  Description                                 Code   1  [place] MFM OB DETAIL ADDL GEST +14              60454.09      WK   2  [place] MFM OB LIMITED                           81191.47  ----------------------------------------------------------------------   #  Ordered By               Order #        Accession #    Episode #   1  Particia Nearing            [id number]      [id number]     [id number]   2  MARTHA DECKER            [id number]      [id number]     [id number]  ---------------------------------------------------------------------- Indications   [redacted] weeks gestation of pregnancy                Z3A.18   Obesity complicating pregnancy, second         O99.212   trimester   Poor obstetric history: Previous preterm       O09.219   delivery, antepartum (21 weeks)   Twin pregnancy, di/di, second trimester        O30.042   Premature rupture of membranes - leaking       O42.90   fluid   Vaginal bleeding in pregnancy, second          O46.92   trimester  ---------------------------------------------------------------------- OB History  Blood Type:            Height:  5'2"   Weight (lb):  270      BMI:   49.38  Gravidity:    2         Prem:   1  Living:       0 ---------------------------------------------------------------------- Fetal Evaluation (Fetus A)  Num Of Fetuses:     2  Fetal Heart         159  Rate(bpm):  Cardiac Activity:   Observed  Fetal Lie:          Lower Fetus  Presentation:       Shoulder  Placenta:           Posterior, above cervical os  P. Cord  Insertion:  Not well visualized  Amniotic Fluid  AFI FV:  Anhydramnios secondary to ROM ---------------------------------------------------------------------- Biometry (Fetus A)  FL:       25.7  mm     G. Age:  17w 6d         38  % ---------------------------------------------------------------------- Gestational Age (Fetus A)  U/S Today:     17w 6d                                        EDD:   01/10/17  Best:          Mackie Pai 0d    Det. By:   Previous Ultrasound      EDD:   01/09/17                                      (05/30/16) ---------------------------------------------------------------------- Anatomy (Fetus A)  Other:  Extremely limited exam due to low amniotic fluid. Technically difficult          due to maternal habitus. ---------------------------------------------------------------------- Fetal Evaluation (Fetus B)  Num Of Fetuses:     2  Fetal Heart         168  Rate(bpm):  Cardiac Activity:   Observed  Fetal Lie:          Upper Fetus  Presentation:       Cephalic  Placenta:           Posterior, above cervical os  P. Cord Insertion:  Visualized  Amniotic Fluid  AFI FV:      Subjectively within normal limits                              Largest Pocket(cm)                              4.8 ---------------------------------------------------------------------- Biometry (Fetus B)  BPD:        42  mm     G. Age:  18w 5d         80  %    CI:        73.79   %   70 - 86                                                          FL/HC:      16.2   %   15.8 - 18  HC:      155.3  mm     G. Age:  18w 3d         65  %    HC/AC:      1.17       1.07 - 1.29  AC:      133.2  mm     G. Age:  18w 6d         74  %    FL/BPD:     59.8   %  FL:       25.1  mm     G. Age:  17w 4d         30  %  FL/AC:      18.8   %   20 - 24  HUM:      25.6  mm     G. Age:  18w 0d         57  %  CER:      18.4  mm     G. Age:  18w 1d         56  %  NFT:       3.1  mm  CM:        5.2  mm  Est. FW:     232  gm      0 lb 8 oz     54  %     FW  Discordancy      0 \ 0 % ---------------------------------------------------------------------- Gestational Age (Fetus B)  U/S Today:     18w 3d                                        EDD:   01/06/17  Best:          18w 0d    Det. By:   Previous Ultrasound      EDD:   01/09/17                                      (05/30/16) ---------------------------------------------------------------------- Anatomy (Fetus B)  Cranium:               Appears normal         LVOT:                   Not well visualized  Cavum:                 Appears normal         Aortic Arch:            Not well visualized  Ventricles:            Appears normal         Ductal Arch:            Not well visualized  Choroid Plexus:        Appears normal         Diaphragm:              Appears normal  Cerebellum:            Appears normal         Stomach:                Appears normal, left                                                                        sided  Posterior Fossa:       Appears normal         Abdomen:                Appears normal  Nuchal Fold:  Appears normal         Abdominal Wall:         Appears nml (cord                                                                        insert, abd wall)  Face:                  Orbits appear          Cord Vessels:           Appears normal (3                         normal                                         vessel cord)  Lips:                  Appears normal         Kidneys:                Previously seen  Palate:                Not well visualized    Bladder:                Previously seen  Thoracic:              Appears normal         Spine:                  Not well visualized  Heart:                 Appears normal         Upper Extremities:      Appears normal                         (4CH, axis, and situs  RVOT:                  Not well visualized    Lower Extremities:      Visualized  Other:  Fetus appears to be a female. Rt heel visualized.  ---------------------------------------------------------------------- Cervix Uterus Adnexa  Cervix  Length:            3.1  cm.  Normal appearance by transabdominal scan.  Left Ovary  Within normal limits.  Right Ovary  Within normal limits.  Adnexa:       No abnormality visualized. ---------------------------------------------------------------------- Comments  Had long discussion the the patient and her mother - guarded  prognosis for twin A and likely both twins.  Discussed risk of  infection that would require delivery of both fetuses, risk of  spontaneous labor, risk of abruption, risk of pulmonary  hypoplasia with Twin A due to anhydamnios.  I reiterated the  remote chance that Twin A would make it to viability ( 23  weeks).  The patient had read up on delayed interval delivery.  We  briefly discussed eventualities should twin A  deliver.  There is  a possibility that twin B would also deliver shortly thereafter  and she would not be a candidate for further expectant  management or other intervention.  In the event that the  patient delivers Twin A, would recommend a high ligation of  the cord (use endo loops) and continued observation - if Twin  B does not deliver after a several hour period of observation,  she may be a candidate for delayed interval delivery  (cerclage).  The patient understands while there have been  case reports of favorable outcomes, many providers would  not be comfortable offering this procedure. Ultimately,  this  would be determined following the delivery of Twin and  availability of providers who would be comfortable offering  this intervention. ---------------------------------------------------------------------- Impression  DC/DA twin gestation at 72w 0d  PROM - twin A yesterday  Twin A:  Anhydramnios noted  Unable to clear any fetal anatomy  Fetal heart activity documented at 159 bpm  Twin B:  Cephalic presentation  Limited views of the fetal heart, spine and face obtained  The  remainder of the fetal anatomy appears normal.  The estimated fetal weight is at the 54th %tile  Posterior placenta  Normal amniotic fluid volume ---------------------------------------------------------------------- Recommendations  See comments above  Plan for outpatient expectant managment for now  Weekly visits in clinic - frequent temperature checks at home.  Patient instructed to come to the hospital immediately for  fever (> 100 F), increased bleeding or cramping  Limited ultrasound with MFM in 2 weeks ----------------------------------------------------------------------                Candis Shine, MD Electronically Signed Final Report   08/08/2016 05:00 pm ----------------------------------------------------------------------  [place] Mfm Ob Limited  Result Date: 08/07/2016 ----------------------------------------------------------------------  OBSTETRICS REPORT                      (Signed Final 08/07/2016 04:20 pm) ---------------------------------------------------------------------- Patient Info  ID #:       [id number]                          D.O.B.:  Jan 31, 1986 (30 yrs)  Name:       Traci Peterson               Visit Date: 08/07/2016 03:56 pm ---------------------------------------------------------------------- Performed By  Performed By:     Emeline Darling BS,      Ref. Address:     [address]                                                             [place], [place]  [address]  Attending:        Charlsie Merles MD         Secondary Phy.:   MAU Nursing-                                                             MAU/Triage  Referred By:      Fredrich Romans                Location:         [facility]                    LEGGETT MD ---------------------------------------------------------------------- Orders   #  Description                                  Code   1  [place] MFM OB LIMITED                           [id number]  ----------------------------------------------------------------------   #  Ordered By               Order #        Accession #    Episode #   1  Judeth Horn            [id number]      [id number]     [id number]  ---------------------------------------------------------------------- Indications   [redacted] weeks gestation of pregnancy                Z3A.17   Obesity complicating pregnancy, first          O99.211   trimester   Poor obstetric history: Previous preterm       O09.219   delivery, antepartum (21 weeks)   Twin pregnancy, di/di, first trimester         O30.041   Premature rupture of membranes - leaking       O42.90   fluid   Vaginal bleeding in pregnancy, second          O46.92   trimester  ---------------------------------------------------------------------- OB History  Blood Type:            Height:  5'2"   Weight (lb):  270       BMI:  49.38  Gravidity:    2         Prem:   1  Living:       0 ---------------------------------------------------------------------- Fetal Evaluation (Fetus A)  Num Of Fetuses:     2  Fetal Heart         161  Rate(bpm):  Cardiac Activity:   Observed  Fetal Lie:          Lower Fetus  Presentation:       Cephalic  Placenta:           Posterior, above cervical os  Amniotic Fluid  AFI FV:      Anhydramnios ---------------------------------------------------------------------- Gestational Age (Fetus A)  Best:          17w 6d     Det. By:  Previous Ultrasound      EDD:   01/09/17                                      (  05/30/16) ---------------------------------------------------------------------- Fetal Evaluation (Fetus B)  Num Of Fetuses:     2  Fetal Heart         170  Rate(bpm):  Cardiac Activity:   Observed  Fetal Lie:          Upper Fetus  Presentation:       Cephalic  Placenta:           Posterior, above cervical os  Amniotic Fluid  AFI FV:      Subjectively within normal limits                              Largest  Pocket(cm)                              3.9 ---------------------------------------------------------------------- Gestational Age (Fetus B)  Best:          17w 6d     Det. By:  Previous Ultrasound      EDD:   01/09/17                                      (05/30/16) ---------------------------------------------------------------------- Cervix Uterus Adnexa  Cervix  Length:            3.4  cm.  Normal appearance by transabdominal scan. ---------------------------------------------------------------------- Impression  Diamniotic Dichorionic intrauterine pregnancy at 17+6 weeks  with vaginal bleeding  Presentation is cephalic/cephalic  Normal amniotic fluid in sac of twin B, with  MVP of 3.9 cm.  Anhydramnios of twin A  normal fetal cardiac activity both twins ---------------------------------------------------------------------- Recommendations  Probable PPROM of twin A  continue clinical evaluation and management ----------------------------------------------------------------------                 Charlsie Merles, MD Electronically Signed Final Report   08/07/2016 04:20 pm ----------------------------------------------------------------------   MAU Course: Checked CBC with differential, UA and wet prep Indocin 75 mg PO x 1 dose given for pain Pain improved.  Negative UA and wet prep. CBC normal.  Assessment: 1. Abdominal pain during pregnancy, second trimester   2. Hyperemesis gravidarum   3. Twin pregnancy with problem affecting pregnancy, antepartum     Plan: Discharge home Recommended Tylenol prn pain for now. PTL and FM precautions advised Follow up with OB provider as scheduled  Follow-up Information    [name], MD Follow up on 08/29/2016.   Specialty:  Obstetrics and Gynecology Why:  9:15 am for OB visit/17P as scheduled Contact information: [address] [place] [address] [phone]           Allergies as of 08/25/2016      Reactions   Lactaid [lactase] Diarrhea       Medication List    TAKE these medications   docusate sodium 100 MG capsule Commonly known as:  COLACE Take 1 capsule (100 mg total) by mouth 2 (two) times daily as needed. What changed:  reasons to take this   Doxylamine-Pyridoxine 10-10 MG Tbec Commonly known as:  DICLEGIS Take 2 tablets by mouth at bedtime.   lansoprazole 15 MG capsule Commonly known as:  PREVACID Take 15 mg by mouth daily at 12 noon.   metoCLOPramide 10 MG tablet Commonly known as:  REGLAN Take 1 tablet (10 mg total) by mouth every 6 (six) hours as needed for nausea.  polyethylene glycol powder powder Commonly known as:  MIRALAX Take 17 g by mouth daily.   prenatal multivitamin Tabs tablet Take 1 tablet by mouth every morning.   promethazine 25 MG suppository Commonly known as:  PHENERGAN Place 1 suppository (25 mg total) rectally every 6 (six) hours as needed for nausea or vomiting.       Tereso Newcomer, MD 08/25/2016 12:25 PM

## 2016-08-25 NOTE — MAU Note (Signed)
Patient presents with having contractions less than every 30 minutes, denies vaginal bleeding, still leaking fluid, Baby A PPROM at 18 weeks.

## 2016-08-25 NOTE — Discharge Instructions (Signed)
Abdominal Pain During Pregnancy  Abdominal pain is common in pregnancy. Most of the time, it does not cause harm. There are many causes of abdominal pain. Some causes are more serious than others and sometimes the cause is not known. Abdominal pain can be a sign that something is very wrong with the pregnancy or the pain may have nothing to do with the pregnancy. Always tell your health care provider if you have any abdominal pain.  Follow these instructions at home:  · Do not have sex or put anything in your vagina until your symptoms go away completely.  · Watch your abdominal pain for any changes.  · Get plenty of rest until your pain improves.  · Drink enough fluid to keep your urine clear or pale yellow.  · Take over-the-counter or prescription medicines only as told by your health care provider.  · Keep all follow-up visits as told by your health care provider. This is important.  Contact a health care provider if:  · You have a fever.  · Your pain gets worse or you have cramping.  · Your pain continues after resting.  Get help right away if:  · You are bleeding, leaking fluid, or passing tissue from the vagina.  · You have vomiting or diarrhea that does not go away.  · You have painful or bloody urination.  · You notice a decrease in your baby's movements.  · You feel very weak or faint.  · You have shortness of breath.  · You develop a severe headache with abdominal pain.  · You have abnormal vaginal discharge with abdominal pain.  This information is not intended to replace advice given to you by your health care provider. Make sure you discuss any questions you have with your health care provider.  Document Released: 05/14/2005 Document Revised: 02/23/2016 Document Reviewed: 12/11/2012  Elsevier Interactive Patient Education © 2017 [employer]

## 2016-08-26 ENCOUNTER — Inpatient Hospital Stay (HOSPITAL_COMMUNITY)
Admission: AD | Admit: 2016-08-26 | Discharge: 2016-08-29 | DRG: 767 | Disposition: A | Discharge status: Home / Self Care | Payer: Medicaid Other | Source: Ambulatory Visit | Attending: Obstetrics and Gynecology | Admitting: Obstetrics and Gynecology

## 2016-08-26 ENCOUNTER — Encounter (HOSPITAL_COMMUNITY): Payer: Self-pay

## 2016-08-26 DIAGNOSIS — Z8249 Family history of ischemic heart disease and other diseases of the circulatory system: Secondary | ICD-10-CM

## 2016-08-26 DIAGNOSIS — O210 Mild hyperemesis gravidarum: Secondary | ICD-10-CM

## 2016-08-26 DIAGNOSIS — Z87891 Personal history of nicotine dependence: Secondary | ICD-10-CM

## 2016-08-26 DIAGNOSIS — O42919 Preterm premature rupture of membranes, unspecified as to length of time between rupture and onset of labor, unspecified trimester: Secondary | ICD-10-CM | POA: Diagnosis present

## 2016-08-26 DIAGNOSIS — Z3A21 21 weeks gestation of pregnancy: Secondary | ICD-10-CM

## 2016-08-26 DIAGNOSIS — O42112 Preterm premature rupture of membranes, onset of labor more than 24 hours following rupture, second trimester: Secondary | ICD-10-CM | POA: Diagnosis present

## 2016-08-26 DIAGNOSIS — O30009 Twin pregnancy, unspecified number of placenta and unspecified number of amniotic sacs, unspecified trimester: Secondary | ICD-10-CM

## 2016-08-26 DIAGNOSIS — Z803 Family history of malignant neoplasm of breast: Secondary | ICD-10-CM

## 2016-08-26 DIAGNOSIS — Z833 Family history of diabetes mellitus: Secondary | ICD-10-CM

## 2016-08-26 DIAGNOSIS — Z8349 Family history of other endocrine, nutritional and metabolic diseases: Secondary | ICD-10-CM

## 2016-08-26 DIAGNOSIS — O30042 Twin pregnancy, dichorionic/diamniotic, second trimester: Secondary | ICD-10-CM | POA: Diagnosis present

## 2016-08-26 DIAGNOSIS — O4702 False labor before 37 completed weeks of gestation, second trimester: Secondary | ICD-10-CM

## 2016-08-26 LAB — TYPE AND SCREEN
ABO/RH(D): O POS
Antibody Screen: NEGATIVE

## 2016-08-26 LAB — CBC
HEMATOCRIT: 33.1 % — AB (ref 36.0–46.0)
HEMOGLOBIN: 11.4 g/dL — AB (ref 12.0–15.0)
MCH: 29.9 pg (ref 26.0–34.0)
MCHC: 34.4 g/dL (ref 30.0–36.0)
MCV: 86.9 fL (ref 78.0–100.0)
Platelets: 249 10*3/uL (ref 150–400)
RBC: 3.81 MIL/uL — AB (ref 3.87–5.11)
RDW: 14.8 % (ref 11.5–15.5)
WBC: 10.9 10*3/uL — AB (ref 4.0–10.5)

## 2016-08-26 MED ORDER — PROMETHAZINE HCL 25 MG/ML IJ SOLN
12.5000 mg | INTRAMUSCULAR | Status: DC | PRN
  Administered 2016-08-26: 12.5 mg via INTRAVENOUS
  Filled 2016-08-26 (×2): qty 1

## 2016-08-26 MED ORDER — ZOLPIDEM TARTRATE 5 MG PO TABS
5.0000 mg | ORAL_TABLET | Freq: Every evening | ORAL | Status: DC | PRN
  Filled 2016-08-26: qty 1

## 2016-08-26 MED ORDER — DOCUSATE SODIUM 100 MG PO CAPS: 100.0000 mg | ORAL_CAPSULE | Freq: Every day | ORAL | Status: DC

## 2016-08-26 MED ORDER — PRENATAL MULTIVITAMIN CH: 1.0000 | ORAL_TABLET | Freq: Every day | ORAL | Status: DC

## 2016-08-26 MED ORDER — CALCIUM CARBONATE ANTACID 500 MG PO CHEW: 2.0000 | CHEWABLE_TABLET | ORAL | Status: DC | PRN

## 2016-08-26 MED ORDER — NALBUPHINE HCL 10 MG/ML IJ SOLN
10.0000 mg | INTRAMUSCULAR | Status: DC | PRN
  Administered 2016-08-26 (×2): 10 mg via INTRAVENOUS
  Filled 2016-08-26 (×3): qty 1

## 2016-08-26 MED ORDER — ACETAMINOPHEN 325 MG PO TABS
650.0000 mg | ORAL_TABLET | ORAL | Status: DC | PRN
Start: 2016-08-26 — End: 2016-08-27

## 2016-08-26 MED ORDER — LACTATED RINGERS IV SOLN
INTRAVENOUS | Status: DC
  Administered 2016-08-26: 19:00:00 via INTRAVENOUS

## 2016-08-26 NOTE — MAU Provider Note (Signed)
History     CSN: [id number]  Arrival date and time: 08/26/16 4098   First Provider Initiated Contact with Patient 08/26/16 1857      Chief Complaint  Patient presents with  . Contractions   HPI [name] is a 31 y.o. G2P0100 at [redacted]w[redacted]d who presents to MAU today with complaint of contractions. The patient had PPROM of baby A on 08/07/16. She was in MAU yesterday with complaint of contractions. She was given indocin and contractions stopped until this morning. She feels they have become more painful and closer together since onset this morning. She denies vaginal bleeding, but has continued to have LOF. She reports pain of 8/10 with contractions.   OB History    Gravida Para Term Preterm AB Living   0   SAB TAB Ectopic Multiple Live Births         0        Past Medical History:  Diagnosis Date  . Anxiety    no current med.  . Chronic headaches   . Gall stones   . HPV in female   . Jaw snapping    states jaw pops if opens mouth too wide  . Ovarian cyst   . PCOS (polycystic ovarian syndrome)   . Pilonidal cyst 02/2013  . Preterm labor 2016   PPROM @ 18 wks, delivery of IUFD @ 21 wks  . Vaginal Pap smear, abnormal     Past Surgical History:  Procedure Laterality Date  . CHOLECYSTECTOMY N/A 07/21/2016   Procedure: LAPAROSCOPIC CHOLECYSTECTOMY;  Surgeon: Abigail Miyamoto, MD;  Location: [facility] OR;  Service: General;  Laterality: N/A;  . PILONIDAL CYST EXCISION N/A 03/12/2013   Procedure: CYST EXCISION PILONIDAL EXTENSIVE;  Surgeon: Shelly Rubenstein, MD;  Location: [facility];  Service: General;  Laterality: N/A;  . WISDOM TOOTH EXTRACTION      Family History  Problem Relation Age of Onset  . Hypertension Mother   . Hyperlipidemia Mother   . Diabetes Mother   . Thyroid disease Mother   . Diabetes Father   . Hypertension Father   . Alcohol abuse Father   . Breast cancer Maternal Aunt   . Lung cancer Paternal Grandfather     Social  History  Substance Use Topics  . Smoking status: Former Smoker    Packs/day: 0.50    Years: 6.00    Types: Cigarettes    Quit date: 03/28/2016  . Smokeless tobacco: Former Neurosurgeon     Comment: 3-5 cig./day  . Alcohol use No     Comment: socially    Allergies:  Allergies  Allergen Reactions  . Lactaid [Lactase] Diarrhea    Facility-Administered Medications Prior to Admission  Medication Dose Route Frequency Provider Last Rate Last Dose  . hydroxyprogesterone caproate (MAKENA) 250 mg/mL injection 250 mg  250 mg Intramuscular Weekly Lesly Dukes, MD   250 mg at 08/22/16 1334   Prescriptions Prior to Admission  Medication Sig Dispense Refill Last Dose  . docusate sodium (COLACE) 100 MG capsule Take 1 capsule (100 mg total) by mouth 2 (two) times daily as needed. (Patient taking differently: Take 100 mg by mouth 2 (two) times daily as needed for moderate constipation. ) 30 capsule 2 Past Month at Unknown time  . Doxylamine-Pyridoxine (DICLEGIS) 10-10 MG TBEC Take 2 tablets by mouth at bedtime. 60 tablet 2 08/24/2016 at Unknown time  . lansoprazole (PREVACID) 15 MG capsule Take 15 mg  by mouth daily at 12 noon.   08/24/2016 at Unknown time  . metoCLOPramide (REGLAN) 10 MG tablet Take 1 tablet (10 mg total) by mouth every 6 (six) hours as needed for nausea. 30 tablet 1 Past Month at Unknown time  . polyethylene glycol powder (MIRALAX) powder Take 17 g by mouth daily. 255 g 0 Past Month at prn  . Prenatal Vit-Fe Fumarate-FA (PRENATAL MULTIVITAMIN) TABS tablet Take 1 tablet by mouth every morning.    Past Week at Unknown time  . promethazine (PHENERGAN) 25 MG suppository Place 1 suppository (25 mg total) rectally every 6 (six) hours as needed for nausea or vomiting. 30 suppository 1 Past Month at prn    Review of Systems  Gastrointestinal: Positive for abdominal pain.  Genitourinary: Positive for vaginal discharge. Negative for vaginal bleeding.   Physical Exam   Blood pressure 131/70,  pulse (!) 111, temperature 98.1 F (36.7 C), temperature source Axillary, resp. rate 20, last menstrual period 01/13/2016, SpO2 100 %.  Physical Exam  Nursing note and vitals reviewed. Constitutional: She is oriented to person, place, and time. She appears well-developed and well-nourished. No distress.  HENT:  Head: Normocephalic and atraumatic.  Cardiovascular: Tachycardia present.   Respiratory: Effort normal.  GI: Soft. She exhibits no distension. There is no tenderness. There is no rebound and no guarding.  Genitourinary: Uterus is enlarged. Cervix exhibits no friability. No bleeding in the vagina. Vaginal discharge (small amount of clear fluid and mucus noted) found.  Neurological: She is alert and oriented to person, place, and time.  Skin: Skin is warm and dry. No erythema.  Psychiatric: She has a normal mood and affect.    MAU Course  Procedures None  MDM TOCO shows no evidence of uterine contractions, but patient appears very uncomfortable.  Discussed patient with Dr. Emelda Fear. Admit for observation over night.   Assessment and Plan  A:  Twin IUP at [redacted]w[redacted]d PPROM Baby A Preterm contractions   P:  Admit for observation  Continue TOCO  Marny Lowenstein, PA-C  08/26/2016, 7:05 PM

## 2016-08-26 NOTE — Progress Notes (Addendum)
Arrived via EMT for ctx. LOF with baby A at [redacted] wksga. Denies bleeding. VSS see flow sheet for details. ]  Hx: Gallbladder surgery Jul 23, 2016  Doppler: Baby A: 167; Baby B: 158    1850: Provider at bs assessing pt  1853: FOB at bs per pt request. Speculum exam performed.   Palpated abdomin during pt stated ctx. Belly soft.   1903: MD at bs assessing pt  1910: pt admitted to L&D. Charge nurse notified.   1917: IV inserted with LR bolus.. Labs drawn.

## 2016-08-27 ENCOUNTER — Encounter (HOSPITAL_COMMUNITY): Payer: Self-pay | Admitting: *Deleted

## 2016-08-27 DIAGNOSIS — Z833 Family history of diabetes mellitus: Secondary | ICD-10-CM | POA: Diagnosis not present

## 2016-08-27 DIAGNOSIS — O30042 Twin pregnancy, dichorionic/diamniotic, second trimester: Secondary | ICD-10-CM | POA: Diagnosis not present

## 2016-08-27 DIAGNOSIS — O42112 Preterm premature rupture of membranes, onset of labor more than 24 hours following rupture, second trimester: Secondary | ICD-10-CM | POA: Diagnosis present

## 2016-08-27 DIAGNOSIS — Z3A21 21 weeks gestation of pregnancy: Secondary | ICD-10-CM

## 2016-08-27 DIAGNOSIS — O42919 Preterm premature rupture of membranes, unspecified as to length of time between rupture and onset of labor, unspecified trimester: Secondary | ICD-10-CM | POA: Diagnosis not present

## 2016-08-27 DIAGNOSIS — Z8249 Family history of ischemic heart disease and other diseases of the circulatory system: Secondary | ICD-10-CM | POA: Diagnosis not present

## 2016-08-27 DIAGNOSIS — Z8349 Family history of other endocrine, nutritional and metabolic diseases: Secondary | ICD-10-CM | POA: Diagnosis not present

## 2016-08-27 DIAGNOSIS — Z87891 Personal history of nicotine dependence: Secondary | ICD-10-CM | POA: Diagnosis not present

## 2016-08-27 DIAGNOSIS — Z803 Family history of malignant neoplasm of breast: Secondary | ICD-10-CM | POA: Diagnosis not present

## 2016-08-27 MED ORDER — ACETAMINOPHEN 325 MG PO TABS: 650.0000 mg | ORAL_TABLET | ORAL | Status: DC | PRN

## 2016-08-27 MED ORDER — FENTANYL CITRATE (PF) 100 MCG/2ML IJ SOLN
100.0000 ug | INTRAMUSCULAR | Status: DC | PRN
  Filled 2016-08-27: qty 2

## 2016-08-27 MED ORDER — PRENATAL MULTIVITAMIN CH
1.0000 | ORAL_TABLET | Freq: Every day | ORAL | Status: DC
  Administered 2016-08-28: 1 via ORAL
  Filled 2016-08-27: qty 1

## 2016-08-27 MED ORDER — FENTANYL CITRATE (PF) 100 MCG/2ML IJ SOLN
INTRAMUSCULAR | Status: AC
  Filled 2016-08-27: qty 2

## 2016-08-27 MED ORDER — PANTOPRAZOLE SODIUM 40 MG PO TBEC
40.0000 mg | DELAYED_RELEASE_TABLET | Freq: Every day | ORAL | Status: DC
  Administered 2016-08-28 – 2016-08-29 (×2): 40 mg via ORAL
  Filled 2016-08-27 (×2): qty 1

## 2016-08-27 MED ORDER — DOCUSATE SODIUM 100 MG PO CAPS
100.0000 mg | ORAL_CAPSULE | Freq: Every day | ORAL | Status: DC
  Administered 2016-08-28: 100 mg via ORAL
  Filled 2016-08-27 (×2): qty 1

## 2016-08-27 MED ORDER — FENTANYL CITRATE (PF) 100 MCG/2ML IJ SOLN
100.0000 ug | Freq: Once | INTRAMUSCULAR | Status: AC
  Administered 2016-08-27: 100 ug via INTRAVENOUS

## 2016-08-27 MED ORDER — HYDROXYPROGESTERONE CAPROATE 250 MG/ML IM OIL
250.0000 mg | TOPICAL_OIL | INTRAMUSCULAR | Status: DC
  Administered 2016-08-29: 250 mg via INTRAMUSCULAR
  Filled 2016-08-27: qty 1

## 2016-08-27 MED ORDER — ZOLPIDEM TARTRATE 5 MG PO TABS
5.0000 mg | ORAL_TABLET | Freq: Every evening | ORAL | Status: DC | PRN
  Administered 2016-08-27 – 2016-08-29 (×3): 5 mg via ORAL
  Filled 2016-08-27 (×2): qty 1

## 2016-08-27 MED ORDER — CALCIUM CARBONATE ANTACID 500 MG PO CHEW
2.0000 | CHEWABLE_TABLET | ORAL | Status: DC | PRN
  Administered 2016-08-28: 400 mg via ORAL
  Filled 2016-08-27: qty 2

## 2016-08-27 MED ORDER — SODIUM CHLORIDE 0.9 % IV SOLN
2.0000 g | Freq: Four times a day (QID) | INTRAVENOUS | Status: AC
  Administered 2016-08-27 – 2016-08-29 (×8): 2 g via INTRAVENOUS
  Filled 2016-08-27 (×8): qty 2000

## 2016-08-27 MED ORDER — DEXTROSE 5 % IV SOLN
500.0000 mg | Freq: Once | INTRAVENOUS | Status: AC
  Administered 2016-08-27: 500 mg via INTRAVENOUS
  Filled 2016-08-27: qty 500

## 2016-08-27 NOTE — Consult Note (Signed)
[facility] --  [place]  Consultation Service: Neonatology   Dr. Emelda Fear has asked for my presence in a group consultation with Ms. Schleifer and family regarding the previable status of her Twin Baby A if she were to deliver her today as is expected.  She is a 30yo G2P0100  who presents in preterm labor with known Di/Di twin pregnancy complicated by PPROM of Baby A on 08/07/16.  I affirmed their little girl's non-viable status when born at this gestational age, as has been already by [facility]. If she were to be born today or in the next week, we would not intervene because of futility and instead OB/L&D will support her in bonding and grieving.    I briefly explained further that if Twin B does not deliver at this time and instead makes it to 23 weeks, he will still be quite immature at that time and that his survivability if intervention is elected is still not certain due to his extreme prematurity.    They expressed understanding that Twin A is not viable.  Please consult Neonatology if Twin B makes it to 23 weeks so that an in depth conversation can be had with family.    Sincerely,  Dineen Kid. Leary Roca, MD Neonatologist

## 2016-08-27 NOTE — Progress Notes (Signed)
Labor Progress Note Traci Peterson is a 31 y.o. G2P0100 at [redacted]w[redacted]d presented for PPROM since 03/13 S: Reports feeling pressure in her bottom. Contractions has spaced out.   O:  BP 125/68 (BP Location: Right Arm)   Pulse 98   Temp 98 F (36.7 C) (Oral)   Resp 18   LMP 01/13/2016   SpO2 100%   Sterile Speculum Exam: cervix 5-7   A&P: 31 y.o. G2P0100 [redacted]w[redacted]d with PPROM #Labor: expectant #Pain: controlled #FWB: previable age #GBS: not applicable Patient is interested in talking to NICU doctor. Called and discussed this with NICU on call who kindly agreed to talk to her.   Almon Hercules, MD 8:53 AM

## 2016-08-28 ENCOUNTER — Encounter (HOSPITAL_COMMUNITY): Payer: Self-pay

## 2016-08-28 DIAGNOSIS — O42919 Preterm premature rupture of membranes, unspecified as to length of time between rupture and onset of labor, unspecified trimester: Secondary | ICD-10-CM

## 2016-08-28 LAB — CBC
HCT: 32.4 % — ABNORMAL LOW (ref 36.0–46.0)
Hemoglobin: 11.2 g/dL — ABNORMAL LOW (ref 12.0–15.0)
MCH: 30.2 pg (ref 26.0–34.0)
MCHC: 34.6 g/dL (ref 30.0–36.0)
MCV: 87.3 fL (ref 78.0–100.0)
Platelets: 224 10*3/uL (ref 150–400)
RBC: 3.71 MIL/uL — ABNORMAL LOW (ref 3.87–5.11)
RDW: 15 % (ref 11.5–15.5)
WBC: 9.6 10*3/uL (ref 4.0–10.5)

## 2016-08-28 NOTE — Progress Notes (Signed)
Patient passed a tiny clot into the toilet, MD notified. No new orders.

## 2016-08-28 NOTE — Progress Notes (Signed)
FACULTY PRACTICE ANTEPARTUM(COMPREHENSIVE) NOTE  Traci Peterson is a 31 y.o. G2P0201 at [redacted]w[redacted]d by best clinical estimate who is admitted for rupture of membranes, Preterm labor.   Fetal presentation is unsure.S/P delivery of twin A yesterday, now singleton  Length of Stay:  1  Days  Subjective:  Patient reports the fetal movement as active. Patient reports uterine contraction  activity as none. Patient reports  vaginal bleeding as none. Patient describes fluid per vagina as None.  Vitals:  Blood pressure (!) 94/55, pulse 98, temperature 98.4 F (36.9 C), temperature source Oral, resp. rate 18, last menstrual period 01/13/2016, SpO2 98 %, unknown if currently breastfeeding. Physical Examination:  General appearance - alert, well appearing, and in no distress Heart - normal rate and regular rhythm Abdomen - soft, nontender, nondistended Fundal Height:  size equals dates Cervical Exam: Not evaluated. Extremities: extremities normal, atraumatic, no cyanosis or edema and Homans sign is negative, no sign of DVT  Membranes:intact  Fetal Monitoring:     Fetal Heart Rate A  Mode External filed at 08/27/2016 2329  Baseline Rate (A) 152 bpm filed at 08/27/2016 2329     Labs:  Results for orders placed or performed during the hospital encounter of 08/26/16 (from the past 24 hour(s))  CBC   Collection Time: 08/28/16  5:32 AM  Result Value Ref Range   WBC 9.6 4.0 - 10.5 K/uL   RBC 3.71 (L) 3.87 - 5.11 MIL/uL   Hemoglobin 11.2 (L) 12.0 - 15.0 g/dL   HCT 16.1 (L) 09.6 - 04.5 %   MCV 87.3 78.0 - 100.0 fL   MCH 30.2 26.0 - 34.0 pg   MCHC 34.6 30.0 - 36.0 g/dL   RDW 40.9 81.1 - 91.4 %   Platelets 224 150 - 400 K/uL     Medications:  Scheduled . ampicillin (OMNIPEN) IV  2 g Intravenous Q6H  . docusate sodium  100 mg Oral Daily  . [START ON 08/29/2016] hydroxyprogesterone caproate  250 mg Intramuscular Weekly  . pantoprazole  40 mg Oral Daily  . prenatal multivitamin  1 tablet Oral  Q1200   I have reviewed the patient's current medications.  ASSESSMENT: Patient Active Problem List   Diagnosis Date Noted  . Gall stones 07/19/2016  . Hyperemesis gravidarum 06/27/2016  . Family history of breast cancer in female 06/06/2016  . Twin pregnancy with problem affecting pregnancy, antepartum 06/06/2016  . Tinea pedis, recurrent 08/08/2015  . Pernicious anemia 08/08/2015  . Preterm premature rupture of membranes (PPROM) with unknown onset of labor   . Morbidly obese (HCC) 10/25/2014  . Eczema 10/25/2014  . 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 Chronic headaches -  no meds 10/25/2014  . Anxiety and depression 11/30/2013  . HPV in female 05/08/2013  . PCOS (polycystic ovarian syndrome) 04/20/2013    PLAN: IV latency antibiotics for 48 hr then PO for 7 days total  Scheryl Darter 08/28/2016,7:53 AM

## 2016-08-28 NOTE — Progress Notes (Addendum)
Pt uo to BR scant amt pink discharge on peri pad.   After using BR Pt had pink mucous on toilet paper nothing in toilet.   Dr. Adrian Blackwater notified of vaginal discharge amt,color will continue to monitor.

## 2016-08-28 NOTE — H&P (Signed)
 Hover for attribution information  History   CSN: [id number]  Arrival date and time: 08/26/16 5638   First Provider Initiated Contact with Patient 08/26/16 1857         Chief Complaint  Patient presents with  . Contractions   HPI [name] is a 31 y.o. G2P0100 at [redacted]w[redacted]d who presents to MAU today with complaint of contractions. The patient had PPROM of baby A on 08/07/16. She was in MAU yesterday with complaint of contractions. She was given indocin and contractions stopped until this morning. She feels they have become more painful and closer together since onset this morning. She denies vaginal bleeding, but has continued to have LOF. She reports pain of 8/10 with contractions.           OB History    Gravida Para Term Preterm AB Living   0   SAB TAB Ectopic Multiple Live Births         0            Past Medical History:  Diagnosis Date  . Anxiety    no current med.  . Chronic headaches   . Gall stones   . HPV in female   . Jaw snapping    states jaw pops if opens mouth too wide  . Ovarian cyst   . PCOS (polycystic ovarian syndrome)   . Pilonidal cyst 02/2013  . Preterm labor 2016   PPROM @ 18 wks, delivery of IUFD @ 21 wks  . Vaginal Pap smear, abnormal          Past Surgical History:  Procedure Laterality Date  . CHOLECYSTECTOMY N/A 07/21/2016   Procedure: LAPAROSCOPIC CHOLECYSTECTOMY;  Surgeon: Abigail Miyamoto, MD;  Location: [facility] OR;  Service: General;  Laterality: N/A;  . PILONIDAL CYST EXCISION N/A 03/12/2013   Procedure: CYST EXCISION PILONIDAL EXTENSIVE;  Surgeon: Shelly Rubenstein, MD;  Location: [facility];  Service: General;  Laterality: N/A;  . WISDOM TOOTH EXTRACTION           Family History  Problem Relation Age of Onset  . Hypertension Mother   . Hyperlipidemia Mother   . Diabetes Mother   . Thyroid disease Mother   . Diabetes Father   . Hypertension Father   .  Alcohol abuse Father   . Breast cancer Maternal Aunt   . Lung cancer Paternal Grandfather           Social History  Substance Use Topics  . Smoking status: Former Smoker    Packs/day: 0.50    Years: 6.00    Types: Cigarettes    Quit date: 03/28/2016  . Smokeless tobacco: Former Neurosurgeon     Comment: 3-5 cig./day  . Alcohol use No     Comment: socially    Allergies:      Allergies  Allergen Reactions  . Lactaid [Lactase] Diarrhea    Facility-Administered Medications Prior to Admission  Medication Dose Route Frequency Provider Last Rate Last Dose  . hydroxyprogesterone caproate (MAKENA) 250 mg/mL injection 250 mg  250 mg Intramuscular Weekly Lesly Dukes, MD   250 mg at 08/22/16 1334          Prescriptions Prior to Admission  Medication Sig Dispense Refill Last Dose  . docusate sodium (COLACE) 100 MG capsule Take 1 capsule (100 mg total) by mouth 2 (two) times daily as needed. (Patient taking differently: Take 100 mg by mouth 2 (two) times daily  as needed for moderate constipation. ) 30 capsule 2 Past Month at Unknown time  . Doxylamine-Pyridoxine (DICLEGIS) 10-10 MG TBEC Take 2 tablets by mouth at bedtime. 60 tablet 2 08/24/2016 at Unknown time  . lansoprazole (PREVACID) 15 MG capsule Take 15 mg by mouth daily at 12 noon.   08/24/2016 at Unknown time  . metoCLOPramide (REGLAN) 10 MG tablet Take 1 tablet (10 mg total) by mouth every 6 (six) hours as needed for nausea. 30 tablet 1 Past Month at Unknown time  . polyethylene glycol powder (MIRALAX) powder Take 17 g by mouth daily. 255 g 0 Past Month at prn  . Prenatal Vit-Fe Fumarate-FA (PRENATAL MULTIVITAMIN) TABS tablet Take 1 tablet by mouth every morning.    Past Week at Unknown time  . promethazine (PHENERGAN) 25 MG suppository Place 1 suppository (25 mg total) rectally every 6 (six) hours as needed for nausea or vomiting. 30 suppository 1 Past Month at prn    Review of Systems  Gastrointestinal:  Positive for abdominal pain.  Genitourinary: Positive for vaginal discharge. Negative for vaginal bleeding.   Physical Exam   Blood pressure 131/70, pulse (!) 111, temperature 98.1 F (36.7 C), temperature source Axillary, resp. rate 20, last menstrual period 01/13/2016, SpO2 100 %.  Physical Exam  Nursing note and vitals reviewed. Constitutional: She is oriented to person, place, and time. She appears well-developed and well-nourished. No distress.  HENT:  Head: Normocephalic and atraumatic.  Cardiovascular: Tachycardia present.   Respiratory: Effort normal.  GI: Soft. She exhibits no distension. There is no tenderness. There is no rebound and no guarding.  Genitourinary: Uterus is enlarged. Cervix exhibits no friability. No bleeding in the vagina. Vaginal discharge (small amount of clear fluid and mucus noted) found.  Neurological: She is alert and oriented to person, place, and time.  Skin: Skin is warm and dry. No erythema.  Psychiatric: She has a normal mood and affect.    MAU Course  Procedures None  MDM TOCO shows no evidence of uterine contractions, but patient appears very uncomfortable.  Discussed patient with Dr. Emelda Fear. Admit for observation over night.   Assessment and Plan  A:  Twin IUP at [redacted]w[redacted]d PPROM Baby A Preterm contractions   P:  Admit for observation  Continue TOCO  Marny Lowenstein, PA-C  08/26/2016, 7:05 PM  `````Attestation of Attending Supervision of Advanced Practitioner: Evaluation and management procedures were performed by the PA/NP/CNM/OB Fellow under my supervision/collaboration. Chart reviewed and agree with management and plan.  Heavenleigh Petruzzi V 08/28/2016 12:33 AM

## 2016-08-28 NOTE — Progress Notes (Signed)
I spoke with pt's RN from Labor and Delivery on 4/2 and was told that pt had her own pastor present.    I followed up today with [name] and with her mother; FOB, Dorinda Hill, had to work today.  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  She stated that she was having a moment because birth registry had just come in.  She had not received a birth certificate for her son Charmian Muff who was born in 2016.  I offered to make a certificate of life for both babies.  She was very Adult nurse.  She asked good questions, including whether or not her milk would come in after delivering one baby even though one is inside.  She requested to speak with Lactation because her milk came in very painfully following her last delivery at 21 weeks.  She also was concerned that her husband would have to go and sign the papers at [facility] alone since she would be on bedrest.  I offered to speak with lactaion and with Ferdinand Lango.  Upon consultation with lactation, I was able to let her know that her milk would not be likely to come in while she is still pregnant, but to let her nurse know if it does so that we can help her figure out how to manage her breast care.  I also let her know that, according to Ferdinand Lango, in this difficult situation, they would come to the hospital or even to her home to sign those papers.    We also spent time processing some of her feelings and affirming her love for all of her babies.  She has very good support from her family and from her community.    Chaplain Dyanne Carrel, Bcc Pager, [phone] 3:51 PM

## 2016-08-29 ENCOUNTER — Encounter: Payer: Medicaid Other | Admitting: Obstetrics & Gynecology

## 2016-08-29 ENCOUNTER — Inpatient Hospital Stay (HOSPITAL_COMMUNITY)
Admission: AD | Admit: 2016-08-29 | Discharge: 2016-08-30 | Disposition: A | Discharge status: Home / Self Care | Payer: Medicaid Other | Source: Ambulatory Visit | Attending: Obstetrics and Gynecology | Admitting: Obstetrics and Gynecology

## 2016-08-29 ENCOUNTER — Encounter (HOSPITAL_COMMUNITY): Payer: Self-pay | Admitting: *Deleted

## 2016-08-29 DIAGNOSIS — O30009 Twin pregnancy, unspecified number of placenta and unspecified number of amniotic sacs, unspecified trimester: Secondary | ICD-10-CM

## 2016-08-29 DIAGNOSIS — O210 Mild hyperemesis gravidarum: Secondary | ICD-10-CM

## 2016-08-29 DIAGNOSIS — O42919 Preterm premature rupture of membranes, unspecified as to length of time between rupture and onset of labor, unspecified trimester: Secondary | ICD-10-CM | POA: Diagnosis present

## 2016-08-29 DIAGNOSIS — Z8751 Personal history of pre-term labor: Secondary | ICD-10-CM

## 2016-08-29 LAB — CBC WITH DIFFERENTIAL/PLATELET
BASOS ABS: 0 10*3/uL (ref 0.0–0.1)
BASOS PCT: 0 %
EOS PCT: 1 %
Eosinophils Absolute: 0.1 10*3/uL (ref 0.0–0.7)
HEMATOCRIT: 32.4 % — AB (ref 36.0–46.0)
Hemoglobin: 11.3 g/dL — ABNORMAL LOW (ref 12.0–15.0)
LYMPHS PCT: 20 %
Lymphs Abs: 2.2 10*3/uL (ref 0.7–4.0)
MCH: 30.3 pg (ref 26.0–34.0)
MCHC: 34.9 g/dL (ref 30.0–36.0)
MCV: 86.9 fL (ref 78.0–100.0)
MONO ABS: 0.6 10*3/uL (ref 0.1–1.0)
Monocytes Relative: 5 %
NEUTROS PCT: 74 %
Neutro Abs: 8 10*3/uL — ABNORMAL HIGH (ref 1.7–7.7)
PLATELETS: 234 10*3/uL (ref 150–400)
RBC: 3.73 MIL/uL — ABNORMAL LOW (ref 3.87–5.11)
RDW: 14.8 % (ref 11.5–15.5)
WBC: 10.9 10*3/uL — ABNORMAL HIGH (ref 4.0–10.5)

## 2016-08-29 LAB — CBC
HCT: 32.8 % — ABNORMAL LOW (ref 36.0–46.0)
Hemoglobin: 11.3 g/dL — ABNORMAL LOW (ref 12.0–15.0)
MCH: 30 pg (ref 26.0–34.0)
MCHC: 34.5 g/dL (ref 30.0–36.0)
MCV: 87 fL (ref 78.0–100.0)
Platelets: 238 10*3/uL (ref 150–400)
RBC: 3.77 MIL/uL — ABNORMAL LOW (ref 3.87–5.11)
RDW: 15 % (ref 11.5–15.5)
WBC: 10.2 10*3/uL (ref 4.0–10.5)

## 2016-08-29 LAB — TYPE AND SCREEN
ABO/RH(D): O POS
ANTIBODY SCREEN: NEGATIVE

## 2016-08-29 MED ORDER — PRENATAL MULTIVITAMIN CH: 1.0000 | ORAL_TABLET | Freq: Every day | ORAL | Status: DC

## 2016-08-29 MED ORDER — LACTATED RINGERS IV SOLN
INTRAVENOUS | Status: DC
  Administered 2016-08-29 (×2): via INTRAVENOUS

## 2016-08-29 MED ORDER — PANTOPRAZOLE SODIUM 20 MG PO TBEC
20.0000 mg | DELAYED_RELEASE_TABLET | Freq: Every day | ORAL | Status: DC
  Filled 2016-08-29 (×2): qty 1

## 2016-08-29 MED ORDER — CALCIUM CARBONATE ANTACID 500 MG PO CHEW: 2.0000 | CHEWABLE_TABLET | ORAL | Status: DC | PRN

## 2016-08-29 MED ORDER — FENTANYL CITRATE (PF) 100 MCG/2ML IJ SOLN
100.0000 ug | Freq: Once | INTRAMUSCULAR | Status: AC
  Administered 2016-08-29: 100 ug via INTRAVENOUS
  Filled 2016-08-29: qty 2

## 2016-08-29 MED ORDER — ZOLPIDEM TARTRATE 5 MG PO TABS: 5.0000 mg | ORAL_TABLET | Freq: Every evening | ORAL | 0 refills | Status: DC | PRN

## 2016-08-29 MED ORDER — FENTANYL CITRATE (PF) 100 MCG/2ML IJ SOLN
INTRAMUSCULAR | Status: AC
  Administered 2016-08-29: 100 ug via INTRAVENOUS
  Filled 2016-08-29: qty 2

## 2016-08-29 MED ORDER — AMOXICILLIN 500 MG PO CAPS
500.0000 mg | ORAL_CAPSULE | Freq: Four times a day (QID) | ORAL | Status: DC
  Administered 2016-08-29: 500 mg via ORAL
  Filled 2016-08-29 (×5): qty 1

## 2016-08-29 MED ORDER — ZOLPIDEM TARTRATE 5 MG PO TABS: 5.0000 mg | ORAL_TABLET | Freq: Every evening | ORAL | Status: DC | PRN

## 2016-08-29 MED ORDER — HYDROXYPROGESTERONE CAPROATE 250 MG/ML IM OIL: 250.0000 mg | TOPICAL_OIL | INTRAMUSCULAR | Status: DC

## 2016-08-29 MED ORDER — ACETAMINOPHEN 325 MG PO TABS: 650.0000 mg | ORAL_TABLET | ORAL | Status: DC | PRN

## 2016-08-29 MED ORDER — HYDROXYPROGESTERONE CAPROATE 250 MG/ML IM OIL
250.0000 mg | TOPICAL_OIL | Freq: Once | INTRAMUSCULAR | Status: DC
Start: 2016-08-29 — End: 2016-08-29

## 2016-08-29 MED ORDER — DOCUSATE SODIUM 100 MG PO CAPS: 100.0000 mg | ORAL_CAPSULE | Freq: Every day | ORAL | Status: DC

## 2016-08-29 MED ORDER — AMOXICILLIN 500 MG PO CAPS: 500.0000 mg | ORAL_CAPSULE | Freq: Four times a day (QID) | ORAL | 0 refills | Status: DC

## 2016-08-29 NOTE — Progress Notes (Signed)
Chaplin notified that patient appears to be going into labor, and if delivery is imminent infant will not be viable.

## 2016-08-29 NOTE — Progress Notes (Signed)
Pt discharged with printed instructions. Pt verbalized an understanding. No concerns are noted. Carmelina Dane, RN

## 2016-08-29 NOTE — Discharge Instructions (Signed)
Premature Rupture and Preterm Premature Rupture of Membranes °A sac made up of membranes surrounds your baby in the womb (uterus). Rupture of membranes is when this sac breaks open. This is also known as your "water breaking." When this sac breaks before labor starts, it is called premature rupture of membranes (PROM). If this happens before 37 weeks of being pregnant, it is called preterm premature rupture of membranes (PPROM). PPROM is serious. It needs medical care right away. °What increases the risk of PPROM? °PPROM is more likely to happen in women who: °· Have an infection. °· Have had PPROM before. °· Have a cervix that is short. °· Have bleeding during the second or third trimester. °· Have a low BMI. This is a measure of body fat. °· Smoke. °· Use drugs. °· Have a low socioeconomic status. ° °What problems can be caused by PROM and PPROM? °This condition creates health dangers for the mother and the baby. These include: °· Giving birth to the baby too early (prematurely). °· Getting a serious infection of the placenta (chorioamnionitis). °· Having the placenta detach from the uterus early (placental abruption). °· Squeezing of the umbilical cord. °· Getting a serious infection after delivery. ° °What are the signs of PROM and PPROM? °· A sudden gush of fluid from the vagina. °· A slow leak of fluid from the vagina. °· Your underwear is wet. °What should I do if I think my water broke? °Call your doctor right away. You will need to go to the hospital to get checked right away. °What happens if I am told that I have PROM or PPROM? °You will have tests done at the hospital. °· If you have PROM, you may be given medicine to start labor (be induced). This may be done if you are not having contractions during the 24 hours after your water broke. °· If you have PPROM and are not having contractions, you may be given medicine to start labor. It will depend on how far along you are in your pregnancy. ° °If you have  PPROM: °· You and your baby will be watched closely to see if you have infections or other problems. °· You may be given: °? An antibiotic medicine. This can stop an infection from starting. °? A steroid medicine. This can help your baby's lungs develop faster. °? A medicine to help prevent cerebral palsy in your baby. °? A medicine to stop early labor (preterm labor). °· You may be told to stay in bed except to use the bathroom (bed rest). °· You may be given medicine to start labor. This may be done if there are problems with you or the baby. ° °Your treatment will depend on many factors. °Contact a doctor if: °· Your water breaks and you are not having contractions. °Get help right away if: °· Your water breaks before you are [redacted] weeks pregnant. °Summary °· When your water breaks before labor starts, it is called premature rupture of membranes (PROM). °· When PROM happens before 37 weeks of pregnancy, it is called preterm premature rupture of membranes (PPROM). °· If you are not having contractions, your labor may be started for you. °This information is not intended to replace advice given to you by your health care provider. Make sure you discuss any questions you have with your health care provider. °Document Released: 08/10/2008 Document Revised: 02/02/2016 Document Reviewed: 02/02/2016 °Elsevier Interactive Patient Education © 2017 [employer] ° °

## 2016-08-29 NOTE — Progress Notes (Signed)
Offered follow up support to [facility] as she prepared for discharge.  She requested a private exit and we took her out a staff door so that she did not have to face other mothers taking their children home.  She is aware that she can call at any point while she is at home on bedrest.  Chaplain Dyanne Carrel, Bcc Pager, [phone] 4:39 PM    08/29/16 1600  Clinical Encounter Type  Visited With Patient  Visit Type Spiritual support

## 2016-08-29 NOTE — H&P (Signed)
ANTEPARTUM ADMISSION HISTORY AND PHYSICAL NOTE   History of Present Illness: Traci Peterson is a 31 y.o. G2P0201 at [redacted]w[redacted]d admitted for Threatened delivery of twin B. She delivered twin A 4 days ago after PPROM on 3.14. Patient reports the fetal movement as active. Patient reports uterine contraction  activity as irregular, every 5-10 minutes. Patient reports  vaginal bleeding as less flow than a normal period. Patient describes fluid per vagina as None. Fetal presentation is unsure.  Patient Active Problem List   Diagnosis Date Noted  . Preterm labor with preterm delivery 08/29/2016  . Preterm labor in second trimester 08/29/2016  . Gall stones 07/19/2016  . Hyperemesis gravidarum 06/27/2016  . Family history of breast cancer in female 06/06/2016  . Twin pregnancy with problem affecting pregnancy, antepartum 06/06/2016  . Tinea pedis, recurrent 08/08/2015  . Pernicious anemia 08/08/2015  . Preterm premature rupture of membranes (PPROM) with unknown onset of labor   . Morbidly obese (HCC) 10/25/2014  . Eczema 10/25/2014  . 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 Chronic headaches -  no meds 10/25/2014  . Anxiety and depression 11/30/2013  . HPV in female 05/08/2013  . PCOS (polycystic ovarian syndrome) 04/20/2013    Past Medical History:  Diagnosis Date  . Anxiety    no current med.  . Chronic headaches   . Gall stones   . HPV in female   . Jaw snapping    states jaw pops if opens mouth too wide  . Ovarian cyst   . PCOS (polycystic ovarian syndrome)   . Pilonidal cyst 02/2013  . Preterm labor 2016   PPROM @ 18 wks, delivery of IUFD @ 21 wks  . Vaginal Pap smear, abnormal     Past Surgical History:  Procedure Laterality Date  . CHOLECYSTECTOMY N/A 07/21/2016   Procedure: LAPAROSCOPIC CHOLECYSTECTOMY;  Surgeon: Abigail Miyamoto, MD;  Location: [facility] OR;  Service: General;  Laterality: N/A;   . PILONIDAL CYST EXCISION N/A 03/12/2013   Procedure: CYST EXCISION PILONIDAL EXTENSIVE;  Surgeon: Shelly Rubenstein, MD;  Location: [facility];  Service: General;  Laterality: N/A;  . WISDOM TOOTH EXTRACTION      OB History  Gravida Para Term Preterm AB Living  SAB TAB Ectopic Multiple Live Births        1 1    # Outcome Date GA Lbr Len/2nd Weight Sex Delivery Anes PTL Lv  2A Preterm 08/27/16 [redacted]w[redacted]d 62:16 / 00:15 10.4 oz (0.295 kg) F Vag-Spont None  LIV  2B Current           1 Preterm 11/12/14 [redacted]w[redacted]d  13.2 oz (0.374 kg) M Vag-Spont None  FD      Social History   Social History  . Marital status: Married    Spouse name: N/A  . Number of children: N/A  . Years of education: N/A   Occupational History  . CMA Other    CNA   Social History Main Topics  . Smoking status: Former Smoker    Packs/day: 0.50    Years: 6.00    Types: Cigarettes    Quit date: 03/28/2016  . Smokeless tobacco: Former Neurosurgeon     Comment: 3-5 cig./day  . Alcohol use No     Comment: socially  . Drug use: No  . Sexual activity: Yes   Other Topics Concern  .  None   Social History Narrative  . None    Family History  Problem Relation Age of Onset  . Hypertension Mother   . Hyperlipidemia Mother   . Diabetes Mother   . Thyroid disease Mother   . Diabetes Father   . Hypertension Father   . Alcohol abuse Father   . Breast cancer Maternal Aunt   . Lung cancer Paternal Grandfather     Allergies  Allergen Reactions  . Lactaid [Lactase] Diarrhea    Facility-Administered Medications Prior to Admission  Medication Dose Route Frequency Provider Last Rate Last Dose  . hydroxyprogesterone caproate (MAKENA) 250 mg/mL injection 250 mg  250 mg Intramuscular Weekly Lesly Dukes, MD   250 mg at 08/22/16 1334   Prescriptions Prior to Admission  Medication Sig Dispense Refill Last Dose  . amoxicillin (AMOXIL) 500 MG capsule Take 1 capsule (500 mg total) by mouth every 6  (six) hours. 20 capsule 0 08/29/2016 at Unknown time  . docusate sodium (COLACE) 100 MG capsule Take 1 capsule (100 mg total) by mouth 2 (two) times daily as needed. (Patient taking differently: Take 100 mg by mouth 2 (two) times daily as needed for moderate constipation. ) 30 capsule 2 08/28/2016 at Unknown time  . Doxylamine-Pyridoxine (DICLEGIS) 10-10 MG TBEC Take 2 tablets by mouth at bedtime. 60 tablet 2 Past Week at Unknown time  . lansoprazole (PREVACID) 15 MG capsule Take 15 mg by mouth daily at 12 noon.   Past Week at Unknown time  . polyethylene glycol powder (MIRALAX) powder Take 17 g by mouth daily. 255 g 0 Past Month at Unknown time  . Prenatal Vit-Fe Fumarate-FA (PRENATAL MULTIVITAMIN) TABS tablet Take 1 tablet by mouth every morning.    Past Week at Unknown time  . promethazine (PHENERGAN) 25 MG suppository Place 1 suppository (25 mg total) rectally every 6 (six) hours as needed for nausea or vomiting. 30 suppository 1 Past Month at Unknown time  . zolpidem (AMBIEN) 5 MG tablet Take 1 tablet (5 mg total) by mouth at bedtime as needed for sleep. 10 tablet 0 08/28/2016 at Unknown time  . metoCLOPramide (REGLAN) 10 MG tablet Take 1 tablet (10 mg total) by mouth every 6 (six) hours as needed for nausea. (Patient not taking: Reported on 08/27/2016) 30 tablet 1 Not Taking at Unknown time    Review of Systems  Constitutional: Negative for chills and fever.  HENT: Negative for congestion and sore throat.   Respiratory: Negative for cough and shortness of breath.   Cardiovascular: Negative for chest pain and leg swelling.  Gastrointestinal: Negative for abdominal pain, heartburn, nausea and vomiting.  Genitourinary: Negative for dysuria, frequency, hematuria and urgency.  Neurological: Negative for dizziness and tingling.     Vitals:  BP 118/65   Pulse 95   Temp 97.9 F (36.6 C) (Oral)   Resp 18   LMP 01/13/2016  Physical Examination: CONSTITUTIONAL: Well-developed, well-nourished female  in no acute distress.  HENT:  Normocephalic, atraumatic, External right and left ear normal. Oropharynx is clear and moist EYES: Conjunctivae and EOM are normal. Pupils are equal, round, and reactive to light. No scleral icterus.  NECK: Normal range of motion, supple, no masses SKIN: Skin is warm and dry. No rash noted. Not diaphoretic. No erythema. No pallor. NEUROLGIC: Alert and oriented to person, place, and time. Normal reflexes, muscle tone coordination. No cranial nerve deficit noted. PSYCHIATRIC: Normal mood and affect. Normal behavior. Normal judgment and thought content. CARDIOVASCULAR: regular rate, no  distal pulses RESPIRATORY: no respiratory distress \\ABDOMEN : Soft, nontender, nondistended, gravid. MUSCULOSKELETAL: Normal range of motion. No edema and no tenderness. 2+ distal pulses. GU: golf ball size clots in vagina  Cervix: visually 1-2 cm. No fetal parts identified on speculum exam.  Membranes:intact Fetal Monitoring:no viable doppler wnl Tocometer: Flat, but mild palpable contractions.  Labs:  Results for orders placed or performed during the hospital encounter of 08/29/16 (from the past 24 hour(s))  CBC with Differential/Platelet   Collection Time: 08/29/16  5:55 PM  Result Value Ref Range   WBC 10.9 (H) 4.0 - 10.5 K/uL   RBC 3.73 (L) 3.87 - 5.11 MIL/uL   Hemoglobin 11.3 (L) 12.0 - 15.0 g/dL   HCT 16.1 (L) 09.6 - 04.5 %   MCV 86.9 78.0 - 100.0 fL   MCH 30.3 26.0 - 34.0 pg   MCHC 34.9 30.0 - 36.0 g/dL   RDW 40.9 81.1 - 91.4 %   Platelets 234 150 - 400 K/uL   Neutrophils Relative % 74 %   Neutro Abs 8.0 (H) 1.7 - 7.7 K/uL   Lymphocytes Relative 20 %   Lymphs Abs 2.2 0.7 - 4.0 K/uL   Monocytes Relative 5 %   Monocytes Absolute 0.6 0.1 - 1.0 K/uL   Eosinophils Relative 1 %   Eosinophils Absolute 0.1 0.0 - 0.7 K/uL   Basophils Relative 0 %   Basophils Absolute 0.0 0.0 - 0.1 K/uL    Imaging Studies: [place] Mfm Ob Detail +14 Wk  Result Date:  08/09/2016 ----------------------------------------------------------------------  OBSTETRICS REPORT                        (Corrected Final 08/09/2016 09:09                                                                          am) ---------------------------------------------------------------------- Patient Info  ID #:       [id number]                          D.O.B.:  November 11, 1985 (30 yrs)  Name:       Julaine Fusi               Visit Date: 08/08/2016 02:27 pm ---------------------------------------------------------------------- Performed By  Performed By:     Earley Brooke     Secondary Phy.:   Geralynn Rile, RDMS                                                             [facility]                                                             [facility]  Attending:        Candis Shine        Address:          [address]                    MD                                                             [place], [place]  Referred By:      Fredrich Romans                Location:         [facility]                    LEGGETT MD  Ref. Address:     [address]                    [place], [place]                    [address] ---------------------------------------------------------------------- Orders   #  Description                                 Code   1  [place] MFM OB DETAIL +14 WK                     60454.09  ----------------------------------------------------------------------   #  Ordered By               Order #        Accession #    Episode #   1  Alpha Gula            [id number]      [id number]     [id number]  ---------------------------------------------------------------------- Indications   [redacted] weeks gestation of pregnancy                Z3A.18   Obesity complicating pregnancy, second         O99.212   trimester   Poor obstetric history: Previous preterm       O09.219   delivery,  antepartum (21 weeks)   Twin pregnancy, di/di, second trimester        O30.042   Premature rupture of membranes - leaking       O42.90   fluid   Vaginal bleeding in pregnancy, second          O46.92   trimester  ---------------------------------------------------------------------- OB History  Blood Type:            Height:  5'2"   Weight (lb):  270       BMI:  49.38  Gravidity:  2         Prem:   1  Living:       0 ---------------------------------------------------------------------- Fetal Evaluation (Fetus A)  Num Of Fetuses:     2  Fetal Heart         159  Rate(bpm):  Cardiac Activity:   Observed  Fetal Lie:          Lower Fetus  Presentation:       Shoulder  Placenta:           Posterior, above cervical os  P. Cord Insertion:  Not well visualized  Amniotic Fluid  AFI FV:      Anhydramnios secondary to ROM ---------------------------------------------------------------------- Biometry (Fetus A)  FL:       25.7  mm     G. Age:  17w 6d         38  % ---------------------------------------------------------------------- Gestational Age (Fetus A)  U/S Today:     17w 6d                                        EDD:   01/10/17  Best:          Mackie Pai 0d     Det. By:  Previous Ultrasound      EDD:   01/09/17                                      (05/30/16) ---------------------------------------------------------------------- Anatomy (Fetus A)  Other:  Extremely limited exam due to low amniotic fluid. Technically difficult          due to maternal habitus. ---------------------------------------------------------------------- Fetal Evaluation (Fetus B)  Num Of Fetuses:     2  Fetal Heart         168  Rate(bpm):  Cardiac Activity:   Observed  Fetal Lie:          Upper Fetus  Presentation:       Cephalic  Placenta:           Posterior, above cervical os  P. Cord Insertion:  Visualized  Amniotic Fluid  AFI FV:      Subjectively within normal limits                              Largest Pocket(cm)                               4.8 ---------------------------------------------------------------------- Biometry (Fetus B)  BPD:        42  mm     G. Age:  18w 5d         80  %    CI:        73.79   %    70 - 86                                                          FL/HC:      16.2   %    15.8 - 18  HC:  155.3  mm     G. Age:  18w 3d         65  %    HC/AC:      1.17        1.07 - 1.29  AC:      133.2  mm     G. Age:  18w 6d         74  %    FL/BPD:     59.8   %  FL:       25.1  mm     G. Age:  17w 4d         30  %    FL/AC:      18.8   %    20 - 24  HUM:      25.6  mm     G. Age:  18w 0d         57  %  CER:      18.4  mm     G. Age:  18w 1d         56  %  NFT:       3.1  mm  CM:        5.2  mm  Est. FW:     232  gm      0 lb 8 oz     54  %     FW Discordancy      0 \ 0 % ---------------------------------------------------------------------- Gestational Age (Fetus B)  U/S Today:     18w 3d                                        EDD:   01/06/17  Best:          18w 0d     Det. By:  Previous Ultrasound      EDD:   01/09/17                                      (05/30/16) ---------------------------------------------------------------------- Anatomy (Fetus B)  Cranium:               Appears normal         LVOT:                   Not well visualized  Cavum:                 Appears normal         Aortic Arch:            Not well visualized  Ventricles:            Appears normal         Ductal Arch:            Not well visualized  Choroid Plexus:        Appears normal         Diaphragm:              Appears normal  Cerebellum:            Appears normal         Stomach:                Appears normal, left  sided  Posterior Fossa:       Appears normal         Abdomen:                Appears normal  Nuchal Fold:           Appears normal         Abdominal Wall:         Appears nml (cord                                                                        insert, abd wall)  Face:                   Orbits appear          Cord Vessels:           Appears normal (3                         normal                                         vessel cord)  Lips:                  Appears normal         Kidneys:                Previously seen  Palate:                Not well visualized    Bladder:                Previously seen  Thoracic:              Appears normal         Spine:                  Not well visualized  Heart:                 Appears normal         Upper Extremities:      Appears normal                         (4CH, axis, and situs  RVOT:                  Not well visualized    Lower Extremities:      Visualized  Other:  Fetus appears to be a female. Rt heel visualized. ---------------------------------------------------------------------- Cervix Uterus Adnexa  Cervix  Length:            3.1  cm.  Normal appearance by transabdominal scan.  Left Ovary  Within normal limits.  Right Ovary  Within normal limits.  Adnexa:       No abnormality visualized. ---------------------------------------------------------------------- Comments  Had long discussion the the patient and her mother - guarded  prognosis for twin A and likely both twins.  Discussed risk of  infection that would require delivery of both fetuses, risk of  spontaneous labor, risk  of abruption, risk of pulmonary  hypoplasia with Twin A due to anhydamnios.  I reiterated the  remote chance that Twin A would make it to viability ( 23  weeks).  The patient had read up on delayed interval delivery.  We  briefly discussed eventualities should twin A deliver.  There is  a possibility that twin B would also deliver shortly thereafter  and she would not be a candidate for further expectant  management or other intervention.  In the event that the  patient delivers Twin A, would recommend a high ligation of  the cord (use endo loops) and continued observation - if Twin  B does not deliver after a several hour period of observation,  she may be a  candidate for delayed interval delivery  (cerclage).  The patient understands while there have been  case reports of favorable outcomes, many providers would  not be comfortable offering this procedure. Ultimately,  this  would be determined following the delivery of Twin and  availability of providers who would be comfortable offering  this intervention. ---------------------------------------------------------------------- Impression  DC/DA twin gestation at 47w 0d  PROM - twin A yesterday  Twin A:  Anhydramnios noted  Unable to clear any fetal anatomy  Fetal heart activity documented at 159 bpm  Twin B:  Cephalic presentation  Limited views of the fetal heart, spine and face obtained  The remainder of the fetal anatomy appears normal.  The estimated fetal weight is at the 54th %tile  Posterior placenta  Normal amniotic fluid volume ---------------------------------------------------------------------- Recommendations  See comments above  Plan for outpatient expectant managment for now  Weekly visits in clinic - frequent temperature checks at home.  Patient instructed to come to the hospital immediately for  fever (> 100 F), increased bleeding or cramping  Limited ultrasound with MFM in 2 weeks ----------------------------------------------------------------------               Fayne Norrie, BS, RDMS, RVT Electronically Signed Corrected Final Report  08/09/2016 09:09 am ----------------------------------------------------------------------  [place] Mfm Ob Limited  Result Date: 08/22/2016 ----------------------------------------------------------------------  OBSTETRICS REPORT                      (Signed Final 08/22/2016 10:53 am) ---------------------------------------------------------------------- Patient Info  ID #:       [id number]                         D.O.B.:   [birth year] (30 yrs)  Name:       Julaine Fusi              Visit Date:  08/22/2016 10:15 am  ---------------------------------------------------------------------- Performed By  Performed By:     Percell Boston          Secondary Phy.:   [facility]                                                             [facility]  Attending:        Charlsie Merles MD         Address:          [id number] [place]  Referred By:      Fredrich Romans                Location:         [facility]                    LEGGETT MD  Ref. Address:     [address]                    [place], [place]                    [address] ---------------------------------------------------------------------- Orders   #  Description                                 Code   1  [place] MFM OB LIMITED                           40981.19  ----------------------------------------------------------------------   #  Ordered By               Order #        Accession #    Episode #   1  Alpha Gula            [id number]      [id number]     [id number]  ---------------------------------------------------------------------- Indications   [redacted] weeks gestation of pregnancy                Z3A.20   Obesity complicating pregnancy, second         O99.212   trimester   Poor obstetric history: Previous preterm       O09.219   delivery, antepartum (21 weeks)   Twin pregnancy, di/di, second trimester        O30.042   Premature rupture of membranes - leaking       O42.90   fluid (baby A)  ---------------------------------------------------------------------- OB History  Blood Type:            Height:  5'2"   Weight (lb):  270      BMI:   49.38  Gravidity:    2         Prem:   1  Living:       0 ---------------------------------------------------------------------- Fetal Evaluation (Fetus A)  Num Of Fetuses:  2  Fetal Heart          149  Rate(bpm):  Cardiac Activity:   Observed  Fetal Lie:          Lower Fetus  Presentation:       Transverse, head to maternal right  Placenta:           Posterior, above cervical os  P. Cord Insertion:  Not well visualized  Amniotic Fluid  AFI FV:      Oligohydramnios                              Largest Pocket(cm)                              1.0 ---------------------------------------------------------------------- Gestational Age (Fetus A)  Best:          20w 0d    Det. By:   Previous Ultrasound      EDD:   01/09/17                                      (05/30/16) ---------------------------------------------------------------------- Anatomy (Fetus A)  Stomach:               Appears normal, left   Bladder:                Appears normal                         sided ---------------------------------------------------------------------- Fetal Evaluation (Fetus B)  Num Of Fetuses:     2  Fetal Heart         157  Rate(bpm):  Cardiac Activity:   Observed  Fetal Lie:          Upper Fetus  Presentation:       Transverse, head to maternal right  Placenta:           Posterior, above cervical os  P. Cord Insertion:  Not well visualized  Amniotic Fluid  AFI FV:      Subjectively within normal limits                              Largest Pocket(cm)                              5.8 ---------------------------------------------------------------------- Gestational Age (Fetus B)  Best:          20w 0d    Det. By:   Previous Ultrasound      EDD:   01/09/17                                      (05/30/16) ---------------------------------------------------------------------- Anatomy (Fetus B)  Stomach:               Appears normal, left   Bladder:                Appears normal                         sided ---------------------------------------------------------------------- Cervix Uterus Adnexa  Cervix  Length:  2.7  cm.  Normal appearance by transabdominal scan.  Uterus  No abnormality visualized.  Left Ovary   No adnexal mass visualized.  Right Ovary  No adnexal mass visualized.  Cul De Sac:   No free fluid seen.  Adnexa:       No abnormality visualized. ---------------------------------------------------------------------- Impression  diamniotic dichorionic  twin gestation at 20+0 weeks, with  PPROM of twin A x 2 weeks  Twin A:  Anhydramnios noted  Normal fetal cardiac activity  Twin B:  Cephalic presentation  Normal amniotic fluid volume and cardiac activity ---------------------------------------------------------------------- Recommendations  Weekly visits in clinic - frequent temperature checks at home.  Patient instructed to come to the hospital immediately for  fever (> 100 F), increased bleeding or cramping  Ultrasound with MFM in 2 week ----------------------------------------------------------------------                 Charlsie Merles, MD Electronically Signed Final Report   08/22/2016 10:53 am ----------------------------------------------------------------------  [place] Mfm Ob Limited  Result Date: 08/09/2016 ----------------------------------------------------------------------  OBSTETRICS REPORT                      (Signed Final 08/08/2016 05:00 pm) ---------------------------------------------------------------------- Patient Info  ID #:       [id number]                         D.O.B.:   1986-02-17 (30 yrs)  Name:       Julaine Fusi              Visit Date:  08/08/2016 02:27 pm ---------------------------------------------------------------------- Performed By  Performed By:     Earley Brooke     Secondary Phy.:   Geralynn Rile, RDMS                                                             [facility]                                                             [facility]                                                             Health[facility]  Attending:        Candis Shine        Address:          [phone] [address]                    MD  [place]ne Sharper, [place]  Referred By:      Fredrich Romans                Location:         [facility]                    LEGGETT MD  Ref. Address:     [address]                    [place], [place]                    [address] ---------------------------------------------------------------------- Orders   #  Description                                 Code   1  [place] MFM OB DETAIL ADDL GEST +14              19147.82      WK   2  [place] MFM OB LIMITED                           95621.30  ----------------------------------------------------------------------   #  Ordered By               Order #        Accession #    Episode #   1  Particia Nearing            [id number]      [id number]     [id number]   2  MARTHA DECKER            [id number]      [id number]     [id number]  ---------------------------------------------------------------------- Indications   [redacted] weeks gestation of pregnancy                Z3A.18   Obesity complicating pregnancy, second         O99.212   trimester   Poor obstetric history: Previous preterm       O09.219   delivery, antepartum (21 weeks)   Twin pregnancy, di/di, second trimester        O30.042   Premature rupture of membranes - leaking       O42.90   fluid   Vaginal bleeding in pregnancy, second          O46.92   trimester  ---------------------------------------------------------------------- OB History  Blood Type:            Height:  5'2"   Weight (lb):  270      BMI:   49.38  Gravidity:    2         Prem:   1  Living:       0 ---------------------------------------------------------------------- Fetal Evaluation (Fetus A)  Num Of Fetuses:     2  Fetal Heart         159  Rate(bpm):  Cardiac Activity:   Observed  Fetal Lie:          Lower Fetus  Presentation:       Shoulder  Placenta:           Posterior, above cervical os  P. Cord Insertion:  Not well visualized  Amniotic Fluid  AFI FV:  Anhydramnios secondary to ROM  ---------------------------------------------------------------------- Biometry (Fetus A)  FL:       25.7  mm     G. Age:  17w 6d         38  % ---------------------------------------------------------------------- Gestational Age (Fetus A)  U/S Today:     17w 6d                                        EDD:   01/10/17  Best:          Mackie Pai 0d    Det. By:   Previous Ultrasound      EDD:   01/09/17                                      (05/30/16) ---------------------------------------------------------------------- Anatomy (Fetus A)  Other:  Extremely limited exam due to low amniotic fluid. Technically difficult          due to maternal habitus. ---------------------------------------------------------------------- Fetal Evaluation (Fetus B)  Num Of Fetuses:     2  Fetal Heart         168  Rate(bpm):  Cardiac Activity:   Observed  Fetal Lie:          Upper Fetus  Presentation:       Cephalic  Placenta:           Posterior, above cervical os  P. Cord Insertion:  Visualized  Amniotic Fluid  AFI FV:      Subjectively within normal limits                              Largest Pocket(cm)                              4.8 ---------------------------------------------------------------------- Biometry (Fetus B)  BPD:        42  mm     G. Age:  18w 5d         80  %    CI:        73.79   %   70 - 86                                                          FL/HC:      16.2   %   15.8 - 18  HC:      155.3  mm     G. Age:  18w 3d         65  %    HC/AC:      1.17       1.07 - 1.29  AC:      133.2  mm     G. Age:  18w 6d         74  %    FL/BPD:     59.8   %  FL:       25.1  mm     G. Age:  17w 4d         30  %  FL/AC:      18.8   %   20 - 24  HUM:      25.6  mm     G. Age:  18w 0d         57  %  CER:      18.4  mm     G. Age:  18w 1d         56  %  NFT:       3.1  mm  CM:        5.2  mm  Est. FW:     232  gm      0 lb 8 oz     54  %     FW Discordancy      0 \ 0 %  ---------------------------------------------------------------------- Gestational Age (Fetus B)  U/S Today:     18w 3d                                        EDD:   01/06/17  Best:          18w 0d    Det. By:   Previous Ultrasound      EDD:   01/09/17                                      (05/30/16) ---------------------------------------------------------------------- Anatomy (Fetus B)  Cranium:               Appears normal         LVOT:                   Not well visualized  Cavum:                 Appears normal         Aortic Arch:            Not well visualized  Ventricles:            Appears normal         Ductal Arch:            Not well visualized  Choroid Plexus:        Appears normal         Diaphragm:              Appears normal  Cerebellum:            Appears normal         Stomach:                Appears normal, left                                                                        sided  Posterior Fossa:       Appears normal         Abdomen:                Appears normal  Nuchal Fold:  Appears normal         Abdominal Wall:         Appears nml (cord                                                                        insert, abd wall)  Face:                  Orbits appear          Cord Vessels:           Appears normal (3                         normal                                         vessel cord)  Lips:                  Appears normal         Kidneys:                Previously seen  Palate:                Not well visualized    Bladder:                Previously seen  Thoracic:              Appears normal         Spine:                  Not well visualized  Heart:                 Appears normal         Upper Extremities:      Appears normal                         (4CH, axis, and situs  RVOT:                  Not well visualized    Lower Extremities:      Visualized  Other:  Fetus appears to be a female. Rt heel visualized.  ---------------------------------------------------------------------- Cervix Uterus Adnexa  Cervix  Length:            3.1  cm.  Normal appearance by transabdominal scan.  Left Ovary  Within normal limits.  Right Ovary  Within normal limits.  Adnexa:       No abnormality visualized. ---------------------------------------------------------------------- Comments  Had long discussion the the patient and her mother - guarded  prognosis for twin A and likely both twins.  Discussed risk of  infection that would require delivery of both fetuses, risk of  spontaneous labor, risk of abruption, risk of pulmonary  hypoplasia with Twin A due to anhydamnios.  I reiterated the  remote chance that Twin A would make it to viability ( 23  weeks).  The patient had read up on delayed interval delivery.  We  briefly discussed eventualities should twin A  deliver.  There is  a possibility that twin B would also deliver shortly thereafter  and she would not be a candidate for further expectant  management or other intervention.  In the event that the  patient delivers Twin A, would recommend a high ligation of  the cord (use endo loops) and continued observation - if Twin  B does not deliver after a several hour period of observation,  she may be a candidate for delayed interval delivery  (cerclage).  The patient understands while there have been  case reports of favorable outcomes, many providers would  not be comfortable offering this procedure. Ultimately,  this  would be determined following the delivery of Twin and  availability of providers who would be comfortable offering  this intervention. ---------------------------------------------------------------------- Impression  DC/DA twin gestation at 5w 0d  PROM - twin A yesterday  Twin A:  Anhydramnios noted  Unable to clear any fetal anatomy  Fetal heart activity documented at 159 bpm  Twin B:  Cephalic presentation  Limited views of the fetal heart, spine and face obtained  The  remainder of the fetal anatomy appears normal.  The estimated fetal weight is at the 54th %tile  Posterior placenta  Normal amniotic fluid volume ---------------------------------------------------------------------- Recommendations  See comments above  Plan for outpatient expectant managment for now  Weekly visits in clinic - frequent temperature checks at home.  Patient instructed to come to the hospital immediately for  fever (> 100 F), increased bleeding or cramping  Limited ultrasound with MFM in 2 weeks ----------------------------------------------------------------------                Candis Shine, MD Electronically Signed Final Report   08/08/2016 05:00 pm ----------------------------------------------------------------------  [place] Mfm Ob Limited  Result Date: 08/07/2016 ----------------------------------------------------------------------  OBSTETRICS REPORT                      (Signed Final 08/07/2016 04:20 pm) ---------------------------------------------------------------------- Patient Info  ID #:       [id number]                          D.O.B.:  06-Oct-1985 (30 yrs)  Name:       Julaine Fusi               Visit Date: 08/07/2016 03:56 pm ---------------------------------------------------------------------- Performed By  Performed By:     Emeline Darling BS,      Ref. Address:     [address]                                                             [place], [place]  [address]  Attending:        Charlsie Merles MD         Secondary Phy.:   MAU Nursing-                                                             MAU/Triage  Referred By:      Fredrich Romans                Location:         [facility]                    LEGGETT MD ---------------------------------------------------------------------- Orders   #  Description                                  Code   1  [place] MFM OB LIMITED                           [id number]  ----------------------------------------------------------------------   #  Ordered By               Order #        Accession #    Episode #   1  Judeth Horn            [id number]      [id number]     [id number]  ---------------------------------------------------------------------- Indications   [redacted] weeks gestation of pregnancy                Z3A.17   Obesity complicating pregnancy, first          O99.211   trimester   Poor obstetric history: Previous preterm       O09.219   delivery, antepartum (21 weeks)   Twin pregnancy, di/di, first trimester         O30.041   Premature rupture of membranes - leaking       O42.90   fluid   Vaginal bleeding in pregnancy, second          O46.92   trimester  ---------------------------------------------------------------------- OB History  Blood Type:            Height:  5'2"   Weight (lb):  270       BMI:  49.38  Gravidity:    2         Prem:   1  Living:       0 ---------------------------------------------------------------------- Fetal Evaluation (Fetus A)  Num Of Fetuses:     2  Fetal Heart         161  Rate(bpm):  Cardiac Activity:   Observed  Fetal Lie:          Lower Fetus  Presentation:       Cephalic  Placenta:           Posterior, above cervical os  Amniotic Fluid  AFI FV:      Anhydramnios ---------------------------------------------------------------------- Gestational Age (Fetus A)  Best:          17w 6d     Det. By:  Previous Ultrasound      EDD:   01/09/17                                      (  05/30/16) ---------------------------------------------------------------------- Fetal Evaluation (Fetus B)  Num Of Fetuses:     2  Fetal Heart         170  Rate(bpm):  Cardiac Activity:   Observed  Fetal Lie:          Upper Fetus  Presentation:       Cephalic  Placenta:           Posterior, above cervical os  Amniotic Fluid  AFI FV:      Subjectively within normal limits                              Largest  Pocket(cm)                              3.9 ---------------------------------------------------------------------- Gestational Age (Fetus B)  Best:          17w 6d     Det. By:  Previous Ultrasound      EDD:   01/09/17                                      (05/30/16) ---------------------------------------------------------------------- Cervix Uterus Adnexa  Cervix  Length:            3.4  cm.  Normal appearance by transabdominal scan. ---------------------------------------------------------------------- Impression  Diamniotic Dichorionic intrauterine pregnancy at 17+6 weeks  with vaginal bleeding  Presentation is cephalic/cephalic  Normal amniotic fluid in sac of twin B, with  MVP of 3.9 cm.  Anhydramnios of twin A  normal fetal cardiac activity both twins ---------------------------------------------------------------------- Recommendations  Probable PPROM of twin A  continue clinical evaluation and management ----------------------------------------------------------------------                 Charlsie Merles, MD Electronically Signed Final Report   08/07/2016 04:20 pm ----------------------------------------------------------------------    Assessment and Plan: Patient Active Problem List   Diagnosis Date Noted  . Preterm labor with preterm delivery 08/29/2016  . Preterm labor in second trimester 08/29/2016  . Gall stones 07/19/2016  . Hyperemesis gravidarum 06/27/2016  . Family history of breast cancer in female 06/06/2016  . Twin pregnancy with problem affecting pregnancy, antepartum 06/06/2016  . Tinea pedis, recurrent 08/08/2015  . Pernicious anemia 08/08/2015  . Preterm premature rupture of membranes (PPROM) with unknown onset of labor   . Morbidly obese (HCC) 10/25/2014  . Eczema 10/25/2014  . 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 Chronic headaches -  no meds 10/25/2014  . Anxiety and depression 11/30/2013  .  HPV in female 05/08/2013  . PCOS (polycystic ovarian syndrome) 04/20/2013   Admit to Antenatal Routine antenatal care LR 75cc/hr Continue amoxicillin latency abx Continue makena Pain medication PRN CBC tonight and repeat in AM Transfer to L and D if getting more uncomfrotable  Ernestina Penna, MD Attending Obstetrician & Gynecologist Faculty Practice, [facility]

## 2016-08-29 NOTE — MAU Provider Note (Signed)
Chief Complaint:  Labor Eval   First Provider Initiated Contact with Patient 08/29/16 1650     HPI: Stepfanie Peterson is a 31 y.o. G2P0201 at 35w3dwho presents to maternity admissions reporting lower abdominal cramping since discharge today.  States they happen about every 5 minutes and last 20 seconds. . She reports good fetal movement, denies LOF, vaginal bleeding, vaginal itching/burning, urinary symptoms, h/a, dizziness, n/v, diarrhea, constipation or fever/chills.    Abdominal Pain  This is a recurrent problem. The current episode started today. The onset quality is gradual. The problem occurs intermittently. The problem has been unchanged. The pain is located in the LLQ and RLQ. The pain is mild. The quality of the pain is cramping. The abdominal pain does not radiate. Pertinent negatives include no constipation, diarrhea, fever, headaches, myalgias, nausea or vomiting. Nothing aggravates the pain. The pain is relieved by nothing. She has tried nothing for the symptoms.    RN Note: Admitted this past Sunday night for preterm labor with twins; delivery Baby A on Monday morning; is on strict bedrest; discharge home this afternoon; c/o ucs since about 1600 today;    Electronically signed by Darene Lamer, RN     Past Medical History: Past Medical History:  Diagnosis Date  . Anxiety    no current med.  . Chronic headaches   . Gall stones   . HPV in female   . Jaw snapping    states jaw pops if opens mouth too wide  . Ovarian cyst   . PCOS (polycystic ovarian syndrome)   . Pilonidal cyst 02/2013  . Preterm labor 2016   PPROM @ 18 wks, delivery of IUFD @ 21 wks  . Vaginal Pap smear, abnormal     Past obstetric history: OB History  Gravida Para Term Preterm AB Living  2 2   2   1   SAB TAB Ectopic Multiple Live Births        1 1    # Outcome Date GA Lbr Len/2nd Weight Sex Delivery Anes PTL Lv  2A Preterm 08/27/16 [redacted]w[redacted]d 62:16 / 00:15 10.4 oz (0.295 kg) F Vag-Spont None   LIV  2B Current           1 Preterm 11/12/14 [redacted]w[redacted]d  13.2 oz (0.374 kg) M Vag-Spont None  FD      Past Surgical History: Past Surgical History:  Procedure Laterality Date  . CHOLECYSTECTOMY N/A 07/21/2016   Procedure: LAPAROSCOPIC CHOLECYSTECTOMY;  Surgeon: Abigail Miyamoto, MD;  Location: [facility] OR;  Service: General;  Laterality: N/A;  . PILONIDAL CYST EXCISION N/A 03/12/2013   Procedure: CYST EXCISION PILONIDAL EXTENSIVE;  Surgeon: Shelly Rubenstein, MD;  Location: [facility];  Service: General;  Laterality: N/A;  . WISDOM TOOTH EXTRACTION      Family History: Family History  Problem Relation Age of Onset  . Hypertension Mother   . Hyperlipidemia Mother   . Diabetes Mother   . Thyroid disease Mother   . Diabetes Father   . Hypertension Father   . Alcohol abuse Father   . Breast cancer Maternal Aunt   . Lung cancer Paternal Grandfather     Social History: Social History  Substance Use Topics  . Smoking status: Former Smoker    Packs/day: 0.50    Years: 6.00    Types: Cigarettes    Quit date: 03/28/2016  . Smokeless tobacco: Former Neurosurgeon     Comment: 3-5 cig./day  . Alcohol use No  Comment: socially    Allergies:  Allergies  Allergen Reactions  . Lactaid [Lactase] Diarrhea    Meds:  Facility-Administered Medications Prior to Admission  Medication Dose Route Frequency Provider Last Rate Last Dose  . hydroxyprogesterone caproate (MAKENA) 250 mg/mL injection 250 mg  250 mg Intramuscular Weekly Lesly Dukes, MD   250 mg at 08/22/16 1334   Prescriptions Prior to Admission  Medication Sig Dispense Refill Last Dose  . amoxicillin (AMOXIL) 500 MG capsule Take 1 capsule (500 mg total) by mouth every 6 (six) hours. 20 capsule 0 08/29/2016 at Unknown time  . docusate sodium (COLACE) 100 MG capsule Take 1 capsule (100 mg total) by mouth 2 (two) times daily as needed. (Patient taking differently: Take 100 mg by mouth 2 (two) times daily as needed for moderate  constipation. ) 30 capsule 2 08/28/2016 at Unknown time  . Doxylamine-Pyridoxine (DICLEGIS) 10-10 MG TBEC Take 2 tablets by mouth at bedtime. 60 tablet 2 Past Week at Unknown time  . lansoprazole (PREVACID) 15 MG capsule Take 15 mg by mouth daily at 12 noon.   Past Week at Unknown time  . polyethylene glycol powder (MIRALAX) powder Take 17 g by mouth daily. 255 g 0 Past Month at Unknown time  . Prenatal Vit-Fe Fumarate-FA (PRENATAL MULTIVITAMIN) TABS tablet Take 1 tablet by mouth every morning.    Past Week at Unknown time  . promethazine (PHENERGAN) 25 MG suppository Place 1 suppository (25 mg total) rectally every 6 (six) hours as needed for nausea or vomiting. 30 suppository 1 Past Month at Unknown time  . zolpidem (AMBIEN) 5 MG tablet Take 1 tablet (5 mg total) by mouth at bedtime as needed for sleep. 10 tablet 0 08/28/2016 at Unknown time  . metoCLOPramide (REGLAN) 10 MG tablet Take 1 tablet (10 mg total) by mouth every 6 (six) hours as needed for nausea. (Patient not taking: Reported on 08/27/2016) 30 tablet 1 Not Taking at Unknown time    I have reviewed patient's Past Medical Hx, Surgical Hx, Family Hx, Social Hx, medications and allergies.   ROS:  Review of Systems  Constitutional: Negative for fever.  Gastrointestinal: Positive for abdominal pain. Negative for constipation, diarrhea, nausea and vomiting.  Musculoskeletal: Negative for myalgias.  Neurological: Negative for headaches.   Other systems negative  Physical Exam  Patient Vitals for the past 24 hrs:  BP Temp Temp src Pulse Resp  08/29/16 1655 118/65 97.9 F (36.6 C) Oral 95 18   Constitutional: Well-developed, well-nourished female in no acute distress.  Cardiovascular: normal rate and rhythm Respiratory: normal effort, clear to auscultation bilaterally GI: Abd soft, tender, gravid appropriate for gestational age.   No rebound or guarding. MS: Extremities nontender, no edema, normal ROM Neurologic: Alert and oriented x 4.   GU: Neg CVAT.  PELVIC EXAM:    FHT:   162    Toco placed, not tracing anything  Labs: No results found for this or any previous visit (from the past 24 hour(s)). --/--/O POS (04/01 1917)  Imaging:  [place] Mfm Ob Detail +14 Wk  Result Date: 08/09/2016 ----------------------------------------------------------------------  OBSTETRICS REPORT                        (Corrected Final 08/09/2016 09:09  am) ---------------------------------------------------------------------- Patient Info  ID #:       [id number]                          D.O.B.:  05-13-1986 (30 yrs)  Name:       Traci Peterson               Visit Date: 08/08/2016 02:27 pm ---------------------------------------------------------------------- Performed By  Performed By:     Earley Brooke     Secondary Phy.:   Geralynn Rile, RDMS                                                             Center for                                                             [facility]                                                             Healthcare  Attending:        Candis Shine        Address:          [address]                    MD                                                             [place], [place]  Referred By:      Fredrich Romans                Location:         [facility]                    LEGGETT MD  Ref. Address:     [address]                    [place], [place]                    [address] ---------------------------------------------------------------------- Orders   #  Description                                 Code   1  [place] MFM OB DETAIL +14 WK  [address].60  ----------------------------------------------------------------------   #  Ordered By               Order #        Accession #    Episode #   1  Alpha Gula            [id number]      [id number]     [id number]   ---------------------------------------------------------------------- Indications   [redacted] weeks gestation of pregnancy                Z3A.18   Obesity complicating pregnancy, second         O99.212   trimester   Poor obstetric history: Previous preterm       O09.219   delivery, antepartum (21 weeks)   Twin pregnancy, di/di, second trimester        O30.042   Premature rupture of membranes - leaking       O42.90   fluid   Vaginal bleeding in pregnancy, second          O46.92   trimester  ---------------------------------------------------------------------- OB History  Blood Type:            Height:  5'2"   Weight (lb):  270       BMI:  49.38  Gravidity:    2         Prem:   1  Living:       0 ---------------------------------------------------------------------- Fetal Evaluation (Fetus A)  Num Of Fetuses:     2  Fetal Heart         159  Rate(bpm):  Cardiac Activity:   Observed  Fetal Lie:          Lower Fetus  Presentation:       Shoulder  Placenta:           Posterior, above cervical os  P. Cord Insertion:  Not well visualized  Amniotic Fluid  AFI FV:      Anhydramnios secondary to ROM ---------------------------------------------------------------------- Biometry (Fetus A)  FL:       25.7  mm     G. Age:  17w 6d         38  % ---------------------------------------------------------------------- Gestational Age (Fetus A)  U/S Today:     17w 6d                                        EDD:   01/10/17  Best:          Mackie Pai 0d     Det. By:  Previous Ultrasound      EDD:   01/09/17                                      (05/30/16) ---------------------------------------------------------------------- Anatomy (Fetus A)  Other:  Extremely limited exam due to low amniotic fluid. Technically difficult          due to maternal habitus. ---------------------------------------------------------------------- Fetal Evaluation (Fetus B)  Num Of Fetuses:     2  Fetal Heart         168  Rate(bpm):  Cardiac Activity:   Observed  Fetal Lie:           Upper Fetus  Presentation:       Cephalic  Placenta:  Posterior, above cervical os  P. Cord Insertion:  Visualized  Amniotic Fluid  AFI FV:      Subjectively within normal limits                              Largest Pocket(cm)                              4.8 ---------------------------------------------------------------------- Biometry (Fetus B)  BPD:        42  mm     G. Age:  18w 5d         80  %    CI:        73.79   %    70 - 86                                                          FL/HC:      16.2   %    15.8 - 18  HC:      155.3  mm     G. Age:  18w 3d         65  %    HC/AC:      1.17        1.07 - 1.29  AC:      133.2  mm     G. Age:  18w 6d         74  %    FL/BPD:     59.8   %  FL:       25.1  mm     G. Age:  17w 4d         30  %    FL/AC:      18.8   %    20 - 24  HUM:      25.6  mm     G. Age:  18w 0d         57  %  CER:      18.4  mm     G. Age:  18w 1d         56  %  NFT:       3.1  mm  CM:        5.2  mm  Est. FW:     232  gm      0 lb 8 oz     54  %     FW Discordancy      0 \ 0 % ---------------------------------------------------------------------- Gestational Age (Fetus B)  U/S Today:     18w 3d                                        EDD:   01/06/17  Best:          18w 0d     Det. By:  Previous Ultrasound      EDD:   01/09/17                                      (  05/30/16) ---------------------------------------------------------------------- Anatomy (Fetus B)  Cranium:               Appears normal         LVOT:                   Not well visualized  Cavum:                 Appears normal         Aortic Arch:            Not well visualized  Ventricles:            Appears normal         Ductal Arch:            Not well visualized  Choroid Plexus:        Appears normal         Diaphragm:              Appears normal  Cerebellum:            Appears normal         Stomach:                Appears normal, left                                                                         sided  Posterior Fossa:       Appears normal         Abdomen:                Appears normal  Nuchal Fold:           Appears normal         Abdominal Wall:         Appears nml (cord                                                                        insert, abd wall)  Face:                  Orbits appear          Cord Vessels:           Appears normal (3                         normal                                         vessel cord)  Lips:                  Appears normal         Kidneys:                Previously seen  Palate:  Not well visualized    Bladder:                Previously seen  Thoracic:              Appears normal         Spine:                  Not well visualized  Heart:                 Appears normal         Upper Extremities:      Appears normal                         (4CH, axis, and situs  RVOT:                  Not well visualized    Lower Extremities:      Visualized  Other:  Fetus appears to be a female. Rt heel visualized. ---------------------------------------------------------------------- Cervix Uterus Adnexa  Cervix  Length:            3.1  cm.  Normal appearance by transabdominal scan.  Left Ovary  Within normal limits.  Right Ovary  Within normal limits.  Adnexa:       No abnormality visualized. ---------------------------------------------------------------------- Comments  Had long discussion the the patient and her mother - guarded  prognosis for twin A and likely both twins.  Discussed risk of  infection that would require delivery of both fetuses, risk of  spontaneous labor, risk of abruption, risk of pulmonary  hypoplasia with Twin A due to anhydamnios.  I reiterated the  remote chance that Twin A would make it to viability ( 23  weeks).  The patient had read up on delayed interval delivery.  We  briefly discussed eventualities should twin A deliver.  There is  a possibility that twin B would also deliver shortly thereafter  and she would not be a candidate for  further expectant  management or other intervention.  In the event that the  patient delivers Twin A, would recommend a high ligation of  the cord (use endo loops) and continued observation - if Twin  B does not deliver after a several hour period of observation,  she may be a candidate for delayed interval delivery  (cerclage).  The patient understands while there have been  case reports of favorable outcomes, many providers would  not be comfortable offering this procedure. Ultimately,  this  would be determined following the delivery of Twin and  availability of providers who would be comfortable offering  this intervention. ---------------------------------------------------------------------- Impression  DC/DA twin gestation at 57w 0d  PROM - twin A yesterday  Twin A:  Anhydramnios noted  Unable to clear any fetal anatomy  Fetal heart activity documented at 159 bpm  Twin B:  Cephalic presentation  Limited views of the fetal heart, spine and face obtained  The remainder of the fetal anatomy appears normal.  The estimated fetal weight is at the 54th %tile  Posterior placenta  Normal amniotic fluid volume ---------------------------------------------------------------------- Recommendations  See comments above  Plan for outpatient expectant managment for now  Weekly visits in clinic - frequent temperature checks at home.  Patient instructed to come to the hospital immediately for  fever (> 100 F), increased bleeding or cramping  Limited ultrasound with MFM in 2 weeks ----------------------------------------------------------------------  Fayne Norrie, BS, RDMS, RVT Electronically Signed Corrected Final Report  08/09/2016 09:09 am ----------------------------------------------------------------------  [place] Mfm Ob Limited  Result Date: 08/22/2016 ----------------------------------------------------------------------  OBSTETRICS REPORT                      (Signed Final 08/22/2016 10:53 am)  ---------------------------------------------------------------------- Patient Info  ID #:       [id number]                         D.O.B.:   18-Aug-1985 (30 yrs)  Name:       Traci Peterson              Visit Date:  08/22/2016 10:15 am ---------------------------------------------------------------------- Performed By  Performed By:     Percell Boston          Secondary Phy.:   [facility]                                                             [facility]                                                             [facility]  Attending:        Charlsie Merles MD         Address:          [address] [place], [place]  Referred By:      Fredrich Romans                Location:         [facility]                    LEGGETT MD  Ref. Address:     [address]                    [place], [place]                    [address] ---------------------------------------------------------------------- Orders   #  Description  Code   1  [place] MFM OB LIMITED                           [id number]  ----------------------------------------------------------------------   #  Ordered By               Order #        Accession #    Episode #   1  Alpha Gula            [id number]      [id number]     [id number]  ---------------------------------------------------------------------- Indications   [redacted] weeks gestation of pregnancy                Z3A.20   Obesity complicating pregnancy, second         O99.212   trimester   Poor obstetric history: Previous preterm       O09.219   delivery, antepartum (21 weeks)   Twin pregnancy, di/di, second trimester        O30.042   Premature rupture of membranes - leaking       O42.90   fluid (baby A)  ---------------------------------------------------------------------- OB History  Blood Type:             Height:  5'2"   Weight (lb):  270      BMI:   49.38  Gravidity:    2         Prem:   1  Living:       0 ---------------------------------------------------------------------- Fetal Evaluation (Fetus A)  Num Of Fetuses:     2  Fetal Heart         149  Rate(bpm):  Cardiac Activity:   Observed  Fetal Lie:          Lower Fetus  Presentation:       Transverse, head to maternal right  Placenta:           Posterior, above cervical os  P. Cord Insertion:  Not well visualized  Amniotic Fluid  AFI FV:      Oligohydramnios                              Largest Pocket(cm)                              1.0 ---------------------------------------------------------------------- Gestational Age (Fetus A)  Best:          20w 0d    Det. By:   Previous Ultrasound      EDD:   01/09/17                                      (05/30/16) ---------------------------------------------------------------------- Anatomy (Fetus A)  Stomach:               Appears normal, left   Bladder:                Appears normal                         sided ---------------------------------------------------------------------- Fetal Evaluation (Fetus B)  Num Of Fetuses:     2  Fetal Heart         157  Rate(bpm):  Cardiac Activity:   Observed  Fetal Lie:          Upper Fetus  Presentation:       Transverse, head to maternal right  Placenta:           Posterior, above cervical os  P. Cord Insertion:  Not well visualized  Amniotic Fluid  AFI FV:      Subjectively within normal limits                              Largest Pocket(cm)                              5.8 ---------------------------------------------------------------------- Gestational Age (Fetus B)  Best:          20w 0d    Det. By:   Previous Ultrasound      EDD:   01/09/17                                      (05/30/16) ---------------------------------------------------------------------- Anatomy (Fetus B)  Stomach:               Appears normal, left   Bladder:                Appears normal                          sided ---------------------------------------------------------------------- Cervix Uterus Adnexa  Cervix  Length:            2.7  cm.  Normal appearance by transabdominal scan.  Uterus  No abnormality visualized.  Left Ovary  No adnexal mass visualized.  Right Ovary  No adnexal mass visualized.  Cul De Sac:   No free fluid seen.  Adnexa:       No abnormality visualized. ---------------------------------------------------------------------- Impression  diamniotic dichorionic  twin gestation at 20+0 weeks, with  PPROM of twin A x 2 weeks  Twin A:  Anhydramnios noted  Normal fetal cardiac activity  Twin B:  Cephalic presentation  Normal amniotic fluid volume and cardiac activity ---------------------------------------------------------------------- Recommendations  Weekly visits in clinic - frequent temperature checks at home.  Patient instructed to come to the hospital immediately for  fever (> 100 F), increased bleeding or cramping  Ultrasound with MFM in 2 week ----------------------------------------------------------------------                 Charlsie Merles, MD Electronically Signed Final Report   08/22/2016 10:53 am ----------------------------------------------------------------------  [place] Mfm Ob Limited  Result Date: 08/09/2016 ----------------------------------------------------------------------  OBSTETRICS REPORT                      (Signed Final 08/08/2016 05:00 pm) ---------------------------------------------------------------------- Patient Info  ID #:       [id number]                         D.O.B.:   [birth year] (30 yrs)  Name:       Traci Peterson              Visit Date:  08/08/2016 02:27 pm ---------------------------------------------------------------------- Performed By  Performed By:     Earley Brooke     Secondary Phy.:   Kathryne Sharper  BS, RDMS                                                             Center for                                                              [facility]                                                             Healthcare  Attending:        Clarene Critchley [facility]        Address:          [address] [place]lyn Kanner                    MD                                                             [place], [place]  Referred By:      Fredrich Romans                Location:         [facility]                    LEGGETT MD  Ref. Address:     [address]                    [place], [place]                    [address] ---------------------------------------------------------------------- Orders   #  Description                                 Code   1  [place] MFM OB DETAIL ADDL GEST +14              60454.09      WK   2  [place] MFM OB LIMITED                           81191.47  ----------------------------------------------------------------------   #  Ordered By               Order #        Accession #    Episode #   1  Particia Nearing            [id number]      [id number]  [id number]   2  MARTHA DECKER            [id number]      [id number]     [id number]  ---------------------------------------------------------------------- Indications   [redacted] weeks gestation of pregnancy                Z3A.18   Obesity complicating pregnancy, second         O99.212   trimester   Poor obstetric history: Previous preterm       O09.219   delivery, antepartum (21 weeks)   Twin pregnancy, di/di, second trimester        O30.042   Premature rupture of membranes - leaking       O42.90   fluid   Vaginal bleeding in pregnancy, second          O46.92   trimester  ---------------------------------------------------------------------- OB History  Blood Type:            Height:  5'2"   Weight (lb):  270      BMI:   49.38  Gravidity:    2         Prem:   1  Living:       0 ---------------------------------------------------------------------- Fetal Evaluation (Fetus A)  Num Of Fetuses:     2  Fetal Heart         159  Rate(bpm):  Cardiac Activity:   Observed  Fetal Lie:           Lower Fetus  Presentation:       Shoulder  Placenta:           Posterior, above cervical os  P. Cord Insertion:  Not well visualized  Amniotic Fluid  AFI FV:      Anhydramnios secondary to ROM ---------------------------------------------------------------------- Biometry (Fetus A)  FL:       25.7  mm     G. Age:  17w 6d         38  % ---------------------------------------------------------------------- Gestational Age (Fetus A)  U/S Today:     17w 6d                                        EDD:   01/10/17  Best:          Mackie Pai 0d    Det. By:   Previous Ultrasound      EDD:   01/09/17                                      (05/30/16) ---------------------------------------------------------------------- Anatomy (Fetus A)  Other:  Extremely limited exam due to low amniotic fluid. Technically difficult          due to maternal habitus. ---------------------------------------------------------------------- Fetal Evaluation (Fetus B)  Num Of Fetuses:     2  Fetal Heart         168  Rate(bpm):  Cardiac Activity:   Observed  Fetal Lie:          Upper Fetus  Presentation:       Cephalic  Placenta:           Posterior, above cervical os  P. Cord Insertion:  Visualized  Amniotic Fluid  AFI FV:      Subjectively within normal limits  Largest Pocket(cm)                              4.8 ---------------------------------------------------------------------- Biometry (Fetus B)  BPD:        42  mm     G. Age:  18w 5d         80  %    CI:        73.79   %   70 - 86                                                          FL/HC:      16.2   %   15.8 - 18  HC:      155.3  mm     G. Age:  18w 3d         65  %    HC/AC:      1.17       1.07 - 1.29  AC:      133.2  mm     G. Age:  18w 6d         74  %    FL/BPD:     59.8   %  FL:       25.1  mm     G. Age:  17w 4d         30  %    FL/AC:      18.8   %   20 - 24  HUM:      25.6  mm     G. Age:  18w 0d         57  %  CER:      18.4  mm     G. Age:  18w 1d          56  %  NFT:       3.1  mm  CM:        5.2  mm  Est. FW:     232  gm      0 lb 8 oz     54  %     FW Discordancy      0 \ 0 % ---------------------------------------------------------------------- Gestational Age (Fetus B)  U/S Today:     18w 3d                                        EDD:   01/06/17  Best:          18w 0d    Det. By:   Previous Ultrasound      EDD:   01/09/17                                      (05/30/16) ---------------------------------------------------------------------- Anatomy (Fetus B)  Cranium:               Appears normal         LVOT:  Not well visualized  Cavum:                 Appears normal         Aortic Arch:            Not well visualized  Ventricles:            Appears normal         Ductal Arch:            Not well visualized  Choroid Plexus:        Appears normal         Diaphragm:              Appears normal  Cerebellum:            Appears normal         Stomach:                Appears normal, left                                                                        sided  Posterior Fossa:       Appears normal         Abdomen:                Appears normal  Nuchal Fold:           Appears normal         Abdominal Wall:         Appears nml (cord                                                                        insert, abd wall)  Face:                  Orbits appear          Cord Vessels:           Appears normal (3                         normal                                         vessel cord)  Lips:                  Appears normal         Kidneys:                Previously seen  Palate:                Not well visualized    Bladder:                Previously seen  Thoracic:              Appears normal  Spine:                  Not well visualized  Heart:                 Appears normal         Upper Extremities:      Appears normal                         (4CH, axis, and situs  RVOT:                  Not well visualized    Lower Extremities:       Visualized  Other:  Fetus appears to be a female. Rt heel visualized. ---------------------------------------------------------------------- Cervix Uterus Adnexa  Cervix  Length:            3.1  cm.  Normal appearance by transabdominal scan.  Left Ovary  Within normal limits.  Right Ovary  Within normal limits.  Adnexa:       No abnormality visualized. ---------------------------------------------------------------------- Comments  Had long discussion the the patient and her mother - guarded  prognosis for twin A and likely both twins.  Discussed risk of  infection that would require delivery of both fetuses, risk of  spontaneous labor, risk of abruption, risk of pulmonary  hypoplasia with Twin A due to anhydamnios.  I reiterated the  remote chance that Twin A would make it to viability ( 23  weeks).  The patient had read up on delayed interval delivery.  We  briefly discussed eventualities should twin A deliver.  There is  a possibility that twin B would also deliver shortly thereafter  and she would not be a candidate for further expectant  management or other intervention.  In the event that the  patient delivers Twin A, would recommend a high ligation of  the cord (use endo loops) and continued observation - if Twin  B does not deliver after a several hour period of observation,  she may be a candidate for delayed interval delivery  (cerclage).  The patient understands while there have been  case reports of favorable outcomes, many providers would  not be comfortable offering this procedure. Ultimately,  this  would be determined following the delivery of Twin and  availability of providers who would be comfortable offering  this intervention. ---------------------------------------------------------------------- Impression  DC/DA twin gestation at 68w 0d  PROM - twin A yesterday  Twin A:  Anhydramnios noted  Unable to clear any fetal anatomy  Fetal heart activity documented at 159 bpm  Twin B:  Cephalic  presentation  Limited views of the fetal heart, spine and face obtained  The remainder of the fetal anatomy appears normal.  The estimated fetal weight is at the 54th %tile  Posterior placenta  Normal amniotic fluid volume ---------------------------------------------------------------------- Recommendations  See comments above  Plan for outpatient expectant managment for now  Weekly visits in clinic - frequent temperature checks at home.  Patient instructed to come to the hospital immediately for  fever (> 100 F), increased bleeding or cramping  Limited ultrasound with MFM in 2 weeks ----------------------------------------------------------------------                Candis Shine, MD Electronically Signed Final Report   08/08/2016 05:00 pm ----------------------------------------------------------------------  [place] Mfm Ob Limited  Result Date: 08/07/2016 ----------------------------------------------------------------------  OBSTETRICS REPORT                      (  Signed Final 08/07/2016 04:20 pm) ---------------------------------------------------------------------- Patient Info  ID #:       [id number]                          D.O.B.:  Dec 05, 1985 (30 yrs)  Name:       Traci Peterson               Visit Date: 08/07/2016 03:56 pm ---------------------------------------------------------------------- Performed By  Performed By:     Emeline Darling BS,      Ref. Address:     [address]                                                             [place], [place]                                                             [address]  Attending:        Charlsie Merles MD         Secondary Phy.:   MAU Nursing-                                                             MAU/Triage  Referred By:      Fredrich Romans                Location:         [facility]                    LEGGETT MD  ---------------------------------------------------------------------- Orders   #  Description                                 Code   1  [place] MFM OB LIMITED                           [id number]  ----------------------------------------------------------------------   #  Ordered By               Order #        Accession #    Episode #   1  Judeth Horn            [id number]      [id number]     [id number]  ---------------------------------------------------------------------- Indications  [redacted] weeks gestation of pregnancy                Z3A.17   Obesity complicating pregnancy, first          O99.211   trimester   Poor obstetric history: Previous preterm       O09.219   delivery, antepartum (21 weeks)   Twin pregnancy, di/di, first trimester         O30.041   Premature rupture of membranes - leaking       O42.90   fluid   Vaginal bleeding in pregnancy, second          O46.92   trimester  ---------------------------------------------------------------------- OB History  Blood Type:            Height:  5'2"   Weight (lb):  270       BMI:  49.38  Gravidity:    2         Prem:   1  Living:       0 ---------------------------------------------------------------------- Fetal Evaluation (Fetus A)  Num Of Fetuses:     2  Fetal Heart         161  Rate(bpm):  Cardiac Activity:   Observed  Fetal Lie:          Lower Fetus  Presentation:       Cephalic  Placenta:           Posterior, above cervical os  Amniotic Fluid  AFI FV:      Anhydramnios ---------------------------------------------------------------------- Gestational Age (Fetus A)  Best:          17w 6d     Det. By:  Previous Ultrasound      EDD:   01/09/17                                      (05/30/16) ---------------------------------------------------------------------- Fetal Evaluation (Fetus B)  Num Of Fetuses:     2  Fetal Heart         170  Rate(bpm):  Cardiac Activity:   Observed  Fetal Lie:          Upper Fetus  Presentation:       Cephalic  Placenta:            Posterior, above cervical os  Amniotic Fluid  AFI FV:      Subjectively within normal limits                              Largest Pocket(cm)                              3.9 ---------------------------------------------------------------------- Gestational Age (Fetus B)  Best:          17w 6d     Det. By:  Previous Ultrasound      EDD:   01/09/17                                      (05/30/16) ---------------------------------------------------------------------- Cervix Uterus Adnexa  Cervix  Length:            3.4  cm.  Normal appearance by transabdominal scan. ---------------------------------------------------------------------- Impression  Diamniotic Dichorionic intrauterine pregnancy at 17+6 weeks  with  vaginal bleeding  Presentation is cephalic/cephalic  Normal amniotic fluid in sac of twin B, with  MVP of 3.9 cm.  Anhydramnios of twin A  normal fetal cardiac activity both twins ---------------------------------------------------------------------- Recommendations  Probable PPROM of twin A  continue clinical evaluation and management ----------------------------------------------------------------------                 Charlsie Merles, MD Electronically Signed Final Report   08/07/2016 04:20 pm ----------------------------------------------------------------------   MAU Course/MDM: Consult Dr Emelda Fear with presentation, exam findings and test results.  Treatments in MAU included exam by Drs Emelda Fear and Genevie Ann.    Assessment: 1. Hyperemesis gravidarum   2. Twin pregnancy with problem affecting pregnancy, antepartum     Plan: Plan per Dr Emelda Fear and Dr Genevie Ann Will admit for observation, anticipate patient progressing to delivery  Wynelle Bourgeois CNM, MSN Certified Nurse-Midwife 08/29/2016 5:32 PM

## 2016-08-29 NOTE — Discharge Summary (Signed)
OB Discharge Summary     Patient Name: Traci Peterson DOB: 05/04/1986 MRN: [id number]  Date of admission: 08/26/2016 Delivering MD: Lorne Skeens   Date of discharge: 08/29/2016  Admitting diagnosis: 21 WKS, TWINS, CTXS Intrauterine pregnancy: [redacted]w[redacted]d     Secondary diagnosis:  Active Problems:   Preterm premature rupture of membranes (PPROM) with unknown onset of labor   Preterm labor with preterm delivery Di/DI twins  Additional problems: none     Discharge diagnosis: Preterm Pregnancy Delivered of baby A                                                                                                Post partum procedures:high ligation of umbilical cord  Augmentation: none  Complications: None  Hospital course:  Onset of Labor With Vaginal Delivery     31 y.o. yo G2P0201 at [redacted]w[redacted]d was admitted in Active Labor on 08/26/2016. Patient had an uncomplicated labor course as follows:  Membrane Rupture Time/Date:   ,    Intrapartum Procedures: Episiotomy: None [1]                                         Lacerations:  None [1]  Patient had a delivery of a Non Viable infant. 08/27/2016  Information for the patient's newborn:  Lekia, Nier [[id number]]  Delivery Method: Vaginal, Spontaneous Delivery (Filed from Delivery Summary)    She was started back on latency antibiotics per MFM. Pateint had an uncomplicated postpartum course.  She is ambulating, tolerating a regular diet, passing flatus, and urinating well. Patient is discharged home in stable condition on 08/29/16. Current plan is for her to return at 23 weeks for inpatient monitoring and administer steroids.   Physical exam  Vitals:   08/28/16 2004 08/29/16 0114 08/29/16 0524 08/29/16 0546  BP: (!) 94/54 99/78  (!) 105/44  Pulse: (!) 104 100 96 94  Resp: 18   18  Temp: 98.4 F (36.9 C) 98.5 F (36.9 C) 98.2 F (36.8 C)   TempSrc: Oral Oral Oral   SpO2: 96% 97% 96%    General: alert, cooperative and  no distress Lochia: none Uterine Fundus: firm DVT Evaluation: No evidence of DVT seen on physical exam. Negative Homan's sign. No cords or calf tenderness. Labs: Lab Results  Component Value Date   WBC 9.6 08/28/2016   HGB 11.2 (L) 08/28/2016   HCT 32.4 (L) 08/28/2016   MCV 87.3 08/28/2016   PLT 224 08/28/2016   CMP Latest Ref Rng & Units 07/20/2016  Glucose 65 - 99 mg/dL 130(Q)  BUN 6 - 20 mg/dL <6(V)  Creatinine 7.84 - 1.00 mg/dL 6.96  Sodium 295 - 284 mmol/L 136  Potassium 3.5 - 5.1 mmol/L 3.9  Chloride 101 - 111 mmol/L 104  CO2 22 - 32 mmol/L 23  Calcium 8.9 - 10.3 mg/dL 8.9  Total Protein 6.5 - 8.1 g/dL 6.4(L)  Total Bilirubin 0.3 - 1.2 mg/dL 0.6  Alkaline Phos 38 - 126 U/L 47  AST 15 - 41 U/L 15  ALT 14 - 54 U/L 14    Discharge instruction: per After Visit Summary and "Baby and Me Booklet".  After visit meds:  Allergies as of 08/29/2016      Reactions   Lactaid [lactase] Diarrhea      Medication List    TAKE these medications   amoxicillin 500 MG capsule Commonly known as:  AMOXIL Take 1 capsule (500 mg total) by mouth every 6 (six) hours.   docusate sodium 100 MG capsule Commonly known as:  COLACE Take 1 capsule (100 mg total) by mouth 2 (two) times daily as needed. What changed:  reasons to take this   Doxylamine-Pyridoxine 10-10 MG Tbec Commonly known as:  DICLEGIS Take 2 tablets by mouth at bedtime.   lansoprazole 15 MG capsule Commonly known as:  PREVACID Take 15 mg by mouth daily at 12 noon.   metoCLOPramide 10 MG tablet Commonly known as:  REGLAN Take 1 tablet (10 mg total) by mouth every 6 (six) hours as needed for nausea.   polyethylene glycol powder powder Commonly known as:  MIRALAX Take 17 g by mouth daily.   prenatal multivitamin Tabs tablet Take 1 tablet by mouth every morning.   promethazine 25 MG suppository Commonly known as:  PHENERGAN Place 1 suppository (25 mg total) rectally every 6 (six) hours as needed for nausea or  vomiting.       Diet: routine diet  Activity: Advance as tolerated. Pelvic rest.   Outpatient follow up:1 week Follow up Appt:No future appointments. Follow up Visit:No Follow-up on file.  Postpartum contraception: None  Newborn Data: Live born female  Birth Weight: 10.4 oz (295 g) APGAR: 5, 5  Disposition:morgue   08/29/2016 Levie Heritage, DO

## 2016-08-29 NOTE — MAU Note (Signed)
Admitted this past Sunday night for preterm labor with twins; delivery Baby A on Monday morning; is on strict bedrest; discharge home this afternoon; c/o ucs since about 1600 today;

## 2016-08-30 ENCOUNTER — Encounter (HOSPITAL_COMMUNITY): Payer: Self-pay

## 2016-08-30 DIAGNOSIS — Z3A21 21 weeks gestation of pregnancy: Secondary | ICD-10-CM

## 2016-08-30 DIAGNOSIS — O30042 Twin pregnancy, dichorionic/diamniotic, second trimester: Secondary | ICD-10-CM

## 2016-08-30 LAB — CBC
HCT: 32.7 % — ABNORMAL LOW (ref 36.0–46.0)
Hemoglobin: 11.3 g/dL — ABNORMAL LOW (ref 12.0–15.0)
MCH: 29.8 pg (ref 26.0–34.0)
MCHC: 34.6 g/dL (ref 30.0–36.0)
MCV: 86.3 fL (ref 78.0–100.0)
PLATELETS: 238 10*3/uL (ref 150–400)
RBC: 3.79 MIL/uL — ABNORMAL LOW (ref 3.87–5.11)
RDW: 14.8 % (ref 11.5–15.5)
WBC: 13.7 10*3/uL — ABNORMAL HIGH (ref 4.0–10.5)

## 2016-08-30 MED ORDER — MISOPROSTOL 200 MCG PO TABS
800.0000 ug | ORAL_TABLET | Freq: Once | ORAL | Status: AC
  Administered 2016-08-30: 800 ug via ORAL

## 2016-08-30 MED ORDER — COCONUT OIL OIL: 1.0000 "application " | TOPICAL_OIL | Status: DC | PRN

## 2016-08-30 MED ORDER — PRENATAL MULTIVITAMIN CH: 1.0000 | ORAL_TABLET | Freq: Every day | ORAL | Status: DC

## 2016-08-30 MED ORDER — IBUPROFEN 600 MG PO TABS: 600.0000 mg | ORAL_TABLET | Freq: Four times a day (QID) | ORAL | Status: DC

## 2016-08-30 MED ORDER — DIBUCAINE 1 % RE OINT
1.0000 | TOPICAL_OINTMENT | RECTAL | Status: DC | PRN
Start: 2016-08-30 — End: 2016-08-30

## 2016-08-30 MED ORDER — FENTANYL CITRATE (PF) 100 MCG/2ML IJ SOLN
100.0000 ug | INTRAMUSCULAR | Status: DC | PRN
  Administered 2016-08-29 – 2016-08-30 (×4): 100 ug via INTRAVENOUS
  Filled 2016-08-30 (×4): qty 2

## 2016-08-30 MED ORDER — ACETAMINOPHEN 325 MG PO TABS: 650.0000 mg | ORAL_TABLET | ORAL | Status: DC | PRN

## 2016-08-30 MED ORDER — WITCH HAZEL-GLYCERIN EX PADS: 1.0000 "application " | MEDICATED_PAD | CUTANEOUS | Status: DC | PRN

## 2016-08-30 MED ORDER — OXYTOCIN 40 UNITS IN LACTATED RINGERS INFUSION - SIMPLE MED
INTRAVENOUS | Status: AC
  Administered 2016-08-30: 03:00:00
  Filled 2016-08-30: qty 1000

## 2016-08-30 MED ORDER — ONDANSETRON HCL 4 MG PO TABS: 4.0000 mg | ORAL_TABLET | ORAL | Status: DC | PRN

## 2016-08-30 MED ORDER — TETANUS-DIPHTH-ACELL PERTUSSIS 5-2.5-18.5 LF-MCG/0.5 IM SUSP: 0.5000 mL | Freq: Once | INTRAMUSCULAR | Status: DC

## 2016-08-30 MED ORDER — OXYTOCIN 40 UNITS IN LACTATED RINGERS INFUSION - SIMPLE MED
1.0000 m[IU]/min | INTRAVENOUS | Status: DC
  Administered 2016-08-30: 666 m[IU]/min via INTRAVENOUS

## 2016-08-30 MED ORDER — BENZOCAINE-MENTHOL 20-0.5 % EX AERO: 1.0000 "application " | INHALATION_SPRAY | CUTANEOUS | Status: DC | PRN

## 2016-08-30 MED ORDER — ZOLPIDEM TARTRATE 5 MG PO TABS: 5.0000 mg | ORAL_TABLET | Freq: Every evening | ORAL | Status: DC | PRN

## 2016-08-30 MED ORDER — HYDROCODONE-ACETAMINOPHEN 5-325 MG PO TABS: 1.0000 | ORAL_TABLET | Freq: Four times a day (QID) | ORAL | 0 refills | Status: DC | PRN

## 2016-08-30 MED ORDER — ONDANSETRON HCL 4 MG/2ML IJ SOLN: 4.0000 mg | INTRAMUSCULAR | Status: DC | PRN

## 2016-08-30 MED ORDER — DIPHENHYDRAMINE HCL 25 MG PO CAPS: 25.0000 mg | ORAL_CAPSULE | Freq: Four times a day (QID) | ORAL | Status: DC | PRN

## 2016-08-30 MED ORDER — IBUPROFEN 600 MG PO TABS: 600.0000 mg | ORAL_TABLET | Freq: Four times a day (QID) | ORAL | 1 refills | Status: DC | PRN

## 2016-08-30 MED ORDER — SIMETHICONE 80 MG PO CHEW: 80.0000 mg | CHEWABLE_TABLET | ORAL | Status: DC | PRN

## 2016-08-30 MED ORDER — SENNOSIDES-DOCUSATE SODIUM 8.6-50 MG PO TABS: 2.0000 | ORAL_TABLET | ORAL | Status: DC

## 2016-08-30 MED ORDER — FENTANYL CITRATE (PF) 100 MCG/2ML IJ SOLN
100.0000 ug | Freq: Once | INTRAMUSCULAR | Status: AC
  Administered 2016-08-30: 100 ug via INTRAVENOUS

## 2016-08-30 MED ORDER — MISOPROSTOL 200 MCG PO TABS
ORAL_TABLET | ORAL | Status: AC
  Filled 2016-08-30: qty 4

## 2016-08-30 NOTE — Discharge Summary (Signed)
Physician Discharge Summary  Patient ID: Traci Peterson MRN: [id number] DOB/AGE: Jan 09, 1986 30 y.o.  Admit date: 08/29/2016 Discharge date: 08/30/2016  Admission Diagnoses:Pregnancy 21 weeks 3 days, twin gestation with prior delivery of twin A, recurrent preterm labor  Discharge Diagnoses: Pregnancy 21 weeks 4 days delivered of twin baby B and placenta baby A, recurrent preterm labor Active Problems:   Preterm labor in second trimester   Discharged Condition: good  Hospital Course: This 31 year old female gravida 2 para 0200 with previous twin gestation recently discharged status post delivery of baby A with high ligation of the cord baby A was being managed at home after deciding to try to attempt prolongation of pregnancy for baby B. The patient presented to the MAU was some bloody carei lochia with cervix on speculum exam is visually closed having some cramping discomfort and the previously mentioned bloody discharge she is admitted with expectations contractions every 6 minutes with increase she was managed expectantly with no active intervention and progressed 7 and 3 AM at which time she agreed to Pitocin and noted The inevitable delivery. She progressed to delivery at 3:15 AM the baby and deliver the placenta simultaneously. The placenta  remaining in the uterus and was unable to be extracted until 2 hours later when a large graves speculum was used along with IV sedation to remove the placenta from the lower uterine segment. Estimated blood loss for this delivery was 200 cc all clots will be allowed to be discharged in 3 hours. Blood type is Rh+. White count was normal on admission.   Consults: None  Significant Diagnostic Studies: labs:  CBC Latest Ref Rng & Units 08/30/2016 08/29/2016 08/29/2016  WBC 4.0 - 10.5 K/uL 13.7(H) 10.2 10.9(H)  Hemoglobin 12.0 - 15.0 g/dL 11.3(L) 11.3(L) 11.3(L)  Hematocrit 36.0 - 46.0 % 32.7(L) 32.8(L) 32.4(L)  Platelets 150 - 400 K/uL 238 238 234      Treatments: IV hydration and spontaneous delivery of twin B and delivered both placentas  Discharge Exam: Blood pressure 132/63, pulse (!) 101, temperature 98.2 F (36.8 C), temperature source Oral, resp. rate 20, height  (1.575 m), weight 121.1 kg (267 lb), last menstrual period 01/13/2016, SpO2 100 %, unknown if currently breastfeeding.   Disposition: 01-Home or Self Care  Discharge Instructions    Call MD for:  persistant nausea and vomiting    Complete by:  As directed    Call MD for:  temperature >100.4    Complete by:  As directed    Diet - low sodium heart healthy    Complete by:  As directed    Increase activity slowly    Complete by:  As directed      Allergies as of 08/30/2016      Reactions   Lactaid [lactase] Diarrhea      Medication List    STOP taking these medications   amoxicillin 500 MG capsule Commonly known as:  AMOXIL   Doxylamine-Pyridoxine 10-10 MG Tbec Commonly known as:  DICLEGIS   metoCLOPramide 10 MG tablet Commonly known as:  REGLAN   promethazine 25 MG suppository Commonly known as:  PHENERGAN   zolpidem 5 MG tablet Commonly known as:  AMBIEN     TAKE these medications   docusate sodium 100 MG capsule Commonly known as:  COLACE Take 1 capsule (100 mg total) by mouth 2 (two) times daily as needed. What changed:  reasons to take this   HYDROcodone-acetaminophen 5-325 MG tablet Commonly known as:  NORCO/VICODIN Take 1  tablet by mouth every 6 (six) hours as needed for moderate pain. May take with ibuprofen   ibuprofen 600 MG tablet Commonly known as:  ADVIL,MOTRIN Take 1 tablet (600 mg total) by mouth every 6 (six) hours as needed.   lansoprazole 15 MG capsule Commonly known as:  PREVACID Take 15 mg by mouth daily at 12 noon.   polyethylene glycol powder powder Commonly known as:  MIRALAX Take 17 g by mouth daily.   prenatal multivitamin Tabs tablet Take 1 tablet by mouth every morning.      Follow-up  Information    [facility] [place] Follow up.   Specialty:  Obstetrics and Gynecology Why:  call to see Dr Penne Lash, or may call [facility] in [place] [phone] Contact information: [address] [place] [address] [phone]          Signed: Tilda Burrow 08/30/2016, 5:40 AM

## 2016-08-30 NOTE — Discharge Instructions (Signed)

## 2016-08-30 NOTE — Progress Notes (Signed)
  Traci Peterson, Traci Peterson [[id number]]  Delivery Note 08/27/16 At 9:15 AM a non-viable female was delivered via Vaginal, Spontaneous Delivery (Presentation: ;  ).  APGAR: 5, 5; weight 10.4 oz (295 g).   Placenta status: , .  Cord:  with the following complications: .  Cord pH: not done  Anesthesia:   Episiotomy: None Lacerations: None Suture Repair:  Est. Blood Loss (mL): 25  Mom to postpartum.  Baby to Couplet care / Skin to Skin.  Tilda Burrow 08/30/2016, 3:49 AM     Traci Peterson, Traci Peterson [[id number]]  Delivery Note 08/30/16 At 3:15 AM a non-viable female was delivered via Vaginal, Spontaneous Delivery (Presentation: ;  ).  APGAR:3,1 , ; weight 13.8 oz (391 g).   Placenta status:placenta B delivered intact, easily  , .  Cord:  with the following complications: .  Cord pH: not done  Anesthesia:  Iv meds Episiotomy: None Lacerations: None Suture Repair:  Est. Blood Loss (mL): 450  Mom to stay in L&D til delivery of baby A placenta.Pecola Leisure to Couplet care / Skin to Skin. The placenta to baby B , this baby, has delivered easily. We are still waiting for delivery of the placenta to baby A, and have done a bedsde u/s which reveals a large amount of placental tissue still in uterus. Cytotec 800 mcg oral given. Will continue pitocin and recheck periodically. Tilda Burrow 08/30/2016, 3:49 AM  Patient ID: Traci Peterson, female   DOB: 10/18/1985, 31 y.o.   MRN: [id number]

## 2016-08-30 NOTE — Lactation Note (Addendum)
Lactation Consultation Note  Patient Name: Traci Peterson [id number] Date: 08/30/2016 Reason for consult: Initial assessment;Other (Comment) (loss of 21 week twins)   Initial consult with mom who lost 21 week twins who wishes to donate her BM. Mom has lost another infant at 21 weeks and did experience engorgement. Maternal history of PCOS.   Discussed supply and demand and normal progression of milk production and milk coming to volume. Discussed with mom we wont know her potential for making a full supply until she begins pumping and stimulating/emptying breasts.   Mom wishes to begin pumping. She was given [employer] [facility] loaner) with instructions for use, set up, assembling, disassembling and cleaning of pump parts. (money for [facility] Loaner donated by L&D RN)Enc mom to pump every 2-3 hours on initiate phase for 15 minutes. Discussed changing to Maintenance phase once milk is in and pump every 2-3 hours for 15-20 minutes. Discussed freezing milk in milk bags and labeling with date, time, and any medications she is taking. Discussed hand expression and enc mom to hand express post pumping, Hand expression handout given.  Lactation after loss Brochure given, mom was referred to [email or web address]. Informed mom there is a milk bank at [facility] in [place] and she is welcome to call them with any questions/concerns.   Screened mom using [facility] Preliminary Screening. Mom is taking Protonix (not on list-only taken in hospital) Diclegis (not on list) Zophran prn, Zophran prn and PNV. Mom reports she has a prescription for Oxycodone, informed her there is a 72 hour waiting period if use for saving milk. Gave mom a copy of the listed medications. Mom is to call Milk Bank to explore above meds. Enc mom to keep accurate and precise records while pumping and donating milk.   Mom is a [facility] client, [facility] referral faxed to [facility] office for pump referral and to inform of fetal losses. Mom informed that she  qualifies for continues [facility] services for 6 months. Mom without further questions/concerns at this time. She has LC phone #. Enc her to call with any questions/concerns. Mom is appreciative of Return call from Comfort Team member, Glenetta Hew, Charity fundraiser.    Maternal Data Has patient been taught Hand Expression?: Yes Does the patient have breastfeeding experience prior to this delivery?: No  Feeding    LATCH Score/Interventions                      Lactation Tools Discussed/Used WIC Program: Yes Pump Review: Setup, frequency, and cleaning Initiated by:: Noralee Stain, RN, IBCLC Date initiated:: 08/30/16   Consult Status Consult Status: Complete    Silas Flood Amory Simonetti 08/30/2016, 11:08 AM

## 2016-09-04 ENCOUNTER — Telehealth (HOSPITAL_COMMUNITY): Payer: Self-pay | Admitting: Lactation Services

## 2016-09-04 NOTE — Telephone Encounter (Signed)
Message left on patient's voice mail inviting her to call me back if she has any breast concerns and/or if she did have success w/donating her milk. (Pt had given permission to S. Hice, RN, IBCLC, for me to call her post-discharge). Glenetta Hew, RN, IBCLC

## 2016-09-05 ENCOUNTER — Encounter (HOSPITAL_COMMUNITY): Payer: Self-pay

## 2016-09-05 ENCOUNTER — Ambulatory Visit (HOSPITAL_COMMUNITY): Payer: Medicaid Other

## 2016-09-05 ENCOUNTER — Encounter: Payer: Medicaid Other | Admitting: Obstetrics & Gynecology

## 2016-09-05 ENCOUNTER — Encounter: Payer: Medicaid Other | Admitting: Obstetrics and Gynecology

## 2016-09-06 ENCOUNTER — Encounter: Payer: Self-pay | Admitting: *Deleted

## 2016-09-12 ENCOUNTER — Ambulatory Visit (INDEPENDENT_AMBULATORY_CARE_PROVIDER_SITE_OTHER): Payer: Medicaid Other | Admitting: Obstetrics & Gynecology

## 2016-09-12 ENCOUNTER — Encounter: Payer: Self-pay | Admitting: Obstetrics & Gynecology

## 2016-09-12 DIAGNOSIS — N883 Incompetence of cervix uteri: Secondary | ICD-10-CM

## 2016-09-12 NOTE — Progress Notes (Signed)
Post Partum Exam  Traci Peterson is a 31 y.o. [phone] female who presents for a postpartum visit. She is 2 weeks postpartum following a spontaneous vaginal delivery. I have fully reviewed the prenatal and intrapartum course. The delivery was at 21w gestational weeks.  Anesthesia: none. Postpartum course has been difficult with the loss of both babies.  She is in counseling. Baby's course has been twins were fetal demise. 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 Bleeding is sporadic. Bowel function is abnormal: Consitpated with gas. Bladder function is normal. Patient is not sexually active. Contraception method is NuvaRing vaginal inserts. Postpartum depression screening:neg  The following portions of the patient's history were reviewed and updated as appropriate: allergies, current medications, past family history, past medical history, past social history, past surgical history and problem list.  Review of Systems Pertinent items noted in HPI and remainder of comprehensive ROS otherwise negative.    Objective:  Blood pressure 118/80, pulse 88, resp. rate 16, height  (1.575 m), weight 259 lb (117.5 kg), last menstrual period 01/13/2016, unknown if currently breastfeeding.   No exam today.  Pt feels well.  Depresion scal 10                                          Assessment:   Doing well after 21 week PPROM twins.  Plan:   1. Contraception: NuvaRing vaginal inserts (sample given today--RN to chart the details) 2. Continue counseling; no evidence of major depression needing meds 3.  Cerclage with next pregnancy 4.  Pap Jan 2019 for HPV positive Jan 2018.

## 2016-09-14 MED ORDER — ETONOGESTREL-ETHINYL ESTRADIOL 0.12-0.015 MG/24HR VA RING: VAGINAL_RING | VAGINAL | 12 refills | Status: DC

## 2016-10-01 ENCOUNTER — Telehealth: Payer: Self-pay | Admitting: *Deleted

## 2016-10-01 DIAGNOSIS — B3731 Acute candidiasis of vulva and vagina: Secondary | ICD-10-CM

## 2016-10-01 DIAGNOSIS — B373 Candidiasis of vulva and vagina: Secondary | ICD-10-CM

## 2016-10-01 MED ORDER — FLUCONAZOLE 150 MG PO TABS: ORAL_TABLET | ORAL | 0 refills | Status: DC

## 2016-10-01 NOTE — Telephone Encounter (Signed)
Pt called stating that she has a yeast infection from antibiotics she got while at the dentist.  She has tried the 3 day OTC Monistat which hasn't helped.  RX for Diflucan sent to her pharmacy.  She also states that she has a boil above her clitoris and is painful.  I recommended warm moist heat and epsom, salt soaks.  If not any relieve in 48 hrs call for appt.

## 2016-10-10 ENCOUNTER — Encounter: Payer: Self-pay | Admitting: Gynecology

## 2016-10-29 NOTE — Addendum Note (Signed)
Addendum  created 10/29/16 1141 by Val EagleMoser, Rolonda Pontarelli, MD   Sign clinical note

## 2016-11-07 ENCOUNTER — Inpatient Hospital Stay (HOSPITAL_COMMUNITY)
Admission: AD | Admit: 2016-11-07 | Discharge: 2016-11-07 | Disposition: A | Discharge status: Home / Self Care | Payer: Medicaid Other | Source: Ambulatory Visit | Attending: Obstetrics & Gynecology | Admitting: Obstetrics & Gynecology

## 2016-11-07 DIAGNOSIS — Z9049 Acquired absence of other specified parts of digestive tract: Secondary | ICD-10-CM | POA: Insufficient documentation

## 2016-11-07 DIAGNOSIS — Z888 Allergy status to other drugs, medicaments and biological substances status: Secondary | ICD-10-CM | POA: Diagnosis not present

## 2016-11-07 DIAGNOSIS — E282 Polycystic ovarian syndrome: Secondary | ICD-10-CM | POA: Diagnosis not present

## 2016-11-07 DIAGNOSIS — F419 Anxiety disorder, unspecified: Secondary | ICD-10-CM | POA: Insufficient documentation

## 2016-11-07 DIAGNOSIS — R102 Pelvic and perineal pain: Secondary | ICD-10-CM | POA: Insufficient documentation

## 2016-11-07 DIAGNOSIS — R51 Headache: Secondary | ICD-10-CM | POA: Insufficient documentation

## 2016-11-07 DIAGNOSIS — Z801 Family history of malignant neoplasm of trachea, bronchus and lung: Secondary | ICD-10-CM | POA: Diagnosis not present

## 2016-11-07 DIAGNOSIS — Z9889 Other specified postprocedural states: Secondary | ICD-10-CM | POA: Insufficient documentation

## 2016-11-07 DIAGNOSIS — Z811 Family history of alcohol abuse and dependence: Secondary | ICD-10-CM | POA: Diagnosis not present

## 2016-11-07 DIAGNOSIS — Z8759 Personal history of other complications of pregnancy, childbirth and the puerperium: Secondary | ICD-10-CM | POA: Diagnosis not present

## 2016-11-07 DIAGNOSIS — Z8349 Family history of other endocrine, nutritional and metabolic diseases: Secondary | ICD-10-CM | POA: Diagnosis not present

## 2016-11-07 DIAGNOSIS — Z87891 Personal history of nicotine dependence: Secondary | ICD-10-CM | POA: Diagnosis not present

## 2016-11-07 DIAGNOSIS — M542 Cervicalgia: Secondary | ICD-10-CM | POA: Diagnosis not present

## 2016-11-07 DIAGNOSIS — Z803 Family history of malignant neoplasm of breast: Secondary | ICD-10-CM | POA: Insufficient documentation

## 2016-11-07 DIAGNOSIS — Z3202 Encounter for pregnancy test, result negative: Secondary | ICD-10-CM | POA: Diagnosis not present

## 2016-11-07 DIAGNOSIS — M2669 Other specified disorders of temporomandibular joint: Secondary | ICD-10-CM | POA: Diagnosis not present

## 2016-11-07 DIAGNOSIS — Z8249 Family history of ischemic heart disease and other diseases of the circulatory system: Secondary | ICD-10-CM | POA: Diagnosis not present

## 2016-11-07 DIAGNOSIS — Z833 Family history of diabetes mellitus: Secondary | ICD-10-CM | POA: Insufficient documentation

## 2016-11-07 LAB — WET PREP, GENITAL
Sperm: NONE SEEN
Trich, Wet Prep: NONE SEEN
YEAST WET PREP: NONE SEEN

## 2016-11-07 LAB — POCT PREGNANCY, URINE: Preg Test, Ur: NEGATIVE

## 2016-11-07 NOTE — MAU Provider Note (Signed)
Non History     CSN: [id number]  Arrival date and time: 11/07/16 1515   First Provider Initiated Contact with Patient 11/07/16 1552      Chief Complaint  Patient presents with  . vaginal pressure and swelling   Non-pregnant female here with cervical/vaginal pressure. She reports sx started today. Feels like "there's a tampon in my cervix". She reached inside her vagina and felt something hard. No recent use of condoms or tampons. No vaginal discharge. No pain. No new partner. No concern for STDs. She's also concerned that she hasn't started her menses since removing Nuvaring 2 days ago.   Past Medical History:  Diagnosis Date  . Anxiety    no current med.  . Chronic headaches   . Gall stones   . HPV in female   . Jaw snapping    states jaw pops if opens mouth too wide  . Ovarian cyst   . PCOS (polycystic ovarian syndrome)   . Pilonidal cyst 02/2013  . Preterm labor 2016   PPROM @ 18 wks, delivery of IUFD @ 21 wks  . Vaginal Pap smear, abnormal     Past Surgical History:  Procedure Laterality Date  . CHOLECYSTECTOMY N/A 07/21/2016   Procedure: LAPAROSCOPIC CHOLECYSTECTOMY;  Surgeon: Abigail Miyamoto, MD;  Location: [facility] OR;  Service: General;  Laterality: N/A;  . PILONIDAL CYST EXCISION N/A 03/12/2013   Procedure: CYST EXCISION PILONIDAL EXTENSIVE;  Surgeon: Shelly Rubenstein, MD;  Location: [facility];  Service: General;  Laterality: N/A;  . WISDOM TOOTH EXTRACTION      Family History  Problem Relation Age of Onset  . Hypertension Mother   . Hyperlipidemia Mother   . Diabetes Mother   . Thyroid disease Mother   . Diabetes Father   . Hypertension Father   . Alcohol abuse Father   . Breast cancer Maternal Aunt   . Lung cancer Paternal Grandfather     Social History  Substance Use Topics  . Smoking status: Former Smoker    Packs/day: 0.50    Years: 6.00    Types: Cigarettes    Quit date: 03/28/2016  . Smokeless tobacco: Former Neurosurgeon     Comment:  3-5 cig./day  . Alcohol use No     Comment: socially    Allergies:  Allergies  Allergen Reactions  . Lactaid [Lactase] Diarrhea    Prescriptions Prior to Admission  Medication Sig Dispense Refill Last Dose  . etonogestrel-ethinyl estradiol (NUVARING) 0.12-0.015 MG/24HR vaginal ring Insert vaginally and leave in place for 3 consecutive weeks, then remove for 1 week. 1 each 12   . fluconazole (DIFLUCAN) 150 MG tablet Take 1 PO now and may repeat in 3 days 2 tablet 0   . Prenatal Vit-Fe Fumarate-FA (PRENATAL MULTIVITAMIN) TABS tablet Take 1 tablet by mouth every morning.    Past Week at Unknown time    Review of Systems  Gastrointestinal: Negative for abdominal pain.  Genitourinary: Positive for vaginal pain. Negative for vaginal bleeding and vaginal discharge.   Physical Exam   Blood pressure 138/85, pulse (!) 108, temperature 98.8 F (37.1 C), temperature source Oral, resp. rate 18, unknown if currently breastfeeding.  Physical Exam  Nursing note and vitals reviewed. Constitutional: She is oriented to person, place, and time. She appears well-developed and well-nourished. No distress.  HENT:  Head: Normocephalic and atraumatic.  Neck: Normal range of motion.  Respiratory: Effort normal. No respiratory distress.  GI: Soft. She exhibits no distension. There is no  tenderness.  Genitourinary:  Genitourinary Comments: External: no lesions or erythema Vagina: rugated, pink, moist, scant thin brown discharge Uterus: non enlarged, anteverted, non tender, no CMT Adnexae: no masses, no tenderness left, no tenderness right   Musculoskeletal: Normal range of motion.  Neurological: She is alert and oriented to person, place, and time.  Skin: Skin is warm and dry.  Psychiatric: She has a normal mood and affect.   Results for orders placed or performed during the hospital encounter of 11/07/16 (from the past 24 hour(s))  Wet prep, genital     Status: Abnormal   Collection Time:  11/07/16  4:02 PM  Result Value Ref Range   Yeast Wet Prep HPF POC NONE SEEN NONE SEEN   Trich, Wet Prep NONE SEEN NONE SEEN   Clue Cells Wet Prep HPF POC PRESENT (A) NONE SEEN   WBC, Wet Prep HPF POC MODERATE (A) NONE SEEN   Sperm NONE SEEN   Pregnancy, urine POC     Status: None   Collection Time: 11/07/16  5:07 PM  Result Value Ref Range   Preg Test, Ur NEGATIVE NEGATIVE   MAU Course  Procedures  MDM Labs ordered and reviewed. No evidence of pregnancy, foreign body, or prolapse. Stable for discharge home.  Assessment and Plan   1. Cervical pain   2.      Negative pregnancy test  Discharge home [facility] Return to MAU for OBGYN emergencies Continue Nuvaring  Allergies as of 11/07/2016      Reactions   Lactaid [lactase] Diarrhea      Medication List    STOP taking these medications   fluconazole 150 MG tablet Commonly known as:  DIFLUCAN     TAKE these medications   etonogestrel-ethinyl estradiol 0.12-0.015 MG/24HR vaginal ring Commonly known as:  NUVARING Insert vaginally and leave in place for 3 consecutive weeks, then remove for 1 week.   prenatal multivitamin Tabs tablet Take 1 tablet by mouth every morning.      Donette LarryMelanie Wendell Fiebig, CNM 11/07/2016, 4:03 PM

## 2016-11-07 NOTE — MAU Note (Addendum)
G2P2L0. Presents to triage for vaginal irritation that started today. Cycle not started and took out nuvaring. 0/10 pain. States "vaginal feels like there is something inside"  Hx twin delivery @ IUP of [redacted] wksga on April 2 and 4. Currently donating breast milk.    1600: Provider at bs assessing. Wet prep and pelvic exam done.   1705: pt voided. Pregnancy test negative. Provider made aware.   Discharge instructions given with pt understanding. Pt left unit via ambulatory with SO.

## 2016-11-07 NOTE — Progress Notes (Signed)
I spent time with Traci Peterson and Traci Peterson to hear about how they have been doing after the loss of their twins several months ago.  They have found a good counselor and practicing good self-care through their grief.  Traci Peterson has found healing by donating breastmilk.    They are still hopeful that they will be able to try again, but they want to give [name]'s body time to heal. They continue to have good support and to support each other well.  Chaplain Dyanne CarrelKaty Lesleigh Hughson, Bcc Pager, [phone] 4:25 PM    11/07/16 1600  Clinical Encounter Type  Visited With Patient and family together  Visit Type Spiritual support  Referral From Patient  Spiritual Encounters  Spiritual Needs Emotional

## 2017-01-29 ENCOUNTER — Inpatient Hospital Stay (HOSPITAL_COMMUNITY)
Admission: AD | Admit: 2017-01-29 | Discharge: 2017-01-29 | Disposition: A | Discharge status: Home / Self Care | Payer: Self-pay | Source: Ambulatory Visit | Attending: Family Medicine | Admitting: Family Medicine

## 2017-01-29 ENCOUNTER — Encounter (HOSPITAL_COMMUNITY): Payer: Self-pay

## 2017-01-29 DIAGNOSIS — Z87891 Personal history of nicotine dependence: Secondary | ICD-10-CM | POA: Insufficient documentation

## 2017-01-29 DIAGNOSIS — Z9049 Acquired absence of other specified parts of digestive tract: Secondary | ICD-10-CM | POA: Insufficient documentation

## 2017-01-29 DIAGNOSIS — B9689 Other specified bacterial agents as the cause of diseases classified elsewhere: Secondary | ICD-10-CM

## 2017-01-29 DIAGNOSIS — E282 Polycystic ovarian syndrome: Secondary | ICD-10-CM | POA: Insufficient documentation

## 2017-01-29 DIAGNOSIS — T192XXA Foreign body in vulva and vagina, initial encounter: Secondary | ICD-10-CM

## 2017-01-29 DIAGNOSIS — Z3202 Encounter for pregnancy test, result negative: Secondary | ICD-10-CM

## 2017-01-29 DIAGNOSIS — X58XXXA Exposure to other specified factors, initial encounter: Secondary | ICD-10-CM | POA: Insufficient documentation

## 2017-01-29 DIAGNOSIS — N760 Acute vaginitis: Secondary | ICD-10-CM | POA: Insufficient documentation

## 2017-01-29 DIAGNOSIS — Z79899 Other long term (current) drug therapy: Secondary | ICD-10-CM | POA: Insufficient documentation

## 2017-01-29 HISTORY — DX: Incomplete uterovaginal prolapse: N81.2

## 2017-01-29 LAB — URINALYSIS, ROUTINE W REFLEX MICROSCOPIC
BACTERIA UA: NONE SEEN
Bilirubin Urine: NEGATIVE
Glucose, UA: NEGATIVE mg/dL
Hgb urine dipstick: NEGATIVE
Ketones, ur: NEGATIVE mg/dL
Nitrite: NEGATIVE
PH: 5 (ref 5.0–8.0)
Protein, ur: 30 mg/dL — AB
SPECIFIC GRAVITY, URINE: 1.032 — AB (ref 1.005–1.030)

## 2017-01-29 LAB — WET PREP, GENITAL
SPERM: NONE SEEN
TRICH WET PREP: NONE SEEN
Yeast Wet Prep HPF POC: NONE SEEN

## 2017-01-29 LAB — CBC
HEMATOCRIT: 39.3 % (ref 36.0–46.0)
HEMOGLOBIN: 13.4 g/dL (ref 12.0–15.0)
MCH: 29.7 pg (ref 26.0–34.0)
MCHC: 34.1 g/dL (ref 30.0–36.0)
MCV: 87.1 fL (ref 78.0–100.0)
Platelets: 236 10*3/uL (ref 150–400)
RBC: 4.51 MIL/uL (ref 3.87–5.11)
RDW: 14.9 % (ref 11.5–15.5)
WBC: 7.8 10*3/uL (ref 4.0–10.5)

## 2017-01-29 LAB — POCT PREGNANCY, URINE: PREG TEST UR: NEGATIVE

## 2017-01-29 MED ORDER — KETOROLAC TROMETHAMINE 60 MG/2ML IM SOLN
60.0000 mg | Freq: Once | INTRAMUSCULAR | Status: AC
  Administered 2017-01-29: 60 mg via INTRAMUSCULAR
  Filled 2017-01-29: qty 2

## 2017-01-29 MED ORDER — METRONIDAZOLE 500 MG PO TABS: 500.0000 mg | ORAL_TABLET | Freq: Two times a day (BID) | ORAL | 0 refills | Status: DC

## 2017-01-29 NOTE — MAU Note (Signed)
Pt states she is having trouble getting her nuvaring out. States it hurts when she tries to take it out. Has been in for over 3 weeks. Pt denies vaginal discharge of bleeding. LMP: sometime in early August. Feels like she is getting "cold sweats" and is worried she is getting an infection.

## 2017-01-29 NOTE — MAU Provider Note (Signed)
History     CSN: [id number]  Arrival date and time: 01/29/17 2029  First Provider Initiated Contact with Patient 01/29/17 2256      Chief Complaint  Patient presents with  . Vaginal Pain  . Contraception   HPI Traci Peterson is a 31 y.o. [id number] female who presents with vaginal pain & contraception issues. States that her nuvaring was due to come out over a week ago but she hasn't been able to remove it. States she can feel it but can't pull out d/t pain. Denies fever but reports some episodes of chills. Denies abdominal pain, dysuria, vaginal discharge, vaginal bleeding, dyspareunia, or postcoital bleeding.   Past Medical History:  Diagnosis Date  . Anxiety    no current med.  . Cervix prolapsed into vagina   . Chronic headaches   . Gall stones   . HPV in female   . Jaw snapping    states jaw pops if opens mouth too wide  . Ovarian cyst   . PCOS (polycystic ovarian syndrome)   . Pilonidal cyst 02/2013  . Preterm labor 2016   PPROM @ 18 wks, delivery of IUFD @ 21 wks  . Vaginal Pap smear, abnormal     Past Surgical History:  Procedure Laterality Date  . CHOLECYSTECTOMY N/A 07/21/2016   Procedure: LAPAROSCOPIC CHOLECYSTECTOMY;  Surgeon: Abigail Miyamoto, MD;  Location: [facility] OR;  Service: General;  Laterality: N/A;  . PILONIDAL CYST EXCISION N/A 03/12/2013   Procedure: CYST EXCISION PILONIDAL EXTENSIVE;  Surgeon: Shelly Rubenstein, MD;  Location: [facility];  Service: General;  Laterality: N/A;  . WISDOM TOOTH EXTRACTION      Family History  Problem Relation Age of Onset  . Hypertension Mother   . Hyperlipidemia Mother   . Diabetes Mother   . Thyroid disease Mother   . Diabetes Father   . Hypertension Father   . Alcohol abuse Father   . Breast cancer Maternal Aunt   . Lung cancer Paternal Grandfather     Social History  Substance Use Topics  . Smoking status: Former Smoker    Packs/day: 0.50    Years: 6.00    Types: Cigarettes    Quit date:  03/28/2016  . Smokeless tobacco: Former Neurosurgeon     Comment: 3-5 cig./day  . Alcohol use No     Comment: socially    Allergies:  Allergies  Allergen Reactions  . Lactaid [Lactase] Diarrhea    Prescriptions Prior to Admission  Medication Sig Dispense Refill Last Dose  . etonogestrel-ethinyl estradiol (NUVARING) 0.12-0.015 MG/24HR vaginal ring Insert vaginally and leave in place for 3 consecutive weeks, then remove for 1 week. 1 each 12   . Prenatal Vit-Fe Fumarate-FA (PRENATAL MULTIVITAMIN) TABS tablet Take 1 tablet by mouth every morning.    Past Week at Unknown time    Review of Systems  Constitutional: Positive for chills. Negative for fever.  Gastrointestinal: Negative.   Genitourinary: Positive for vaginal pain. Negative for dyspareunia, dysuria, vaginal bleeding and vaginal discharge.   Physical Exam   Blood pressure 130/85, pulse 83, temperature 98.3 F (36.8 C), temperature source Oral, resp. rate 19, height 5\' 2"  (1.575 m), weight 248 lb (112.5 kg), SpO2 100 %, unknown if currently breastfeeding.  Physical Exam  Nursing note and vitals reviewed. Constitutional: She is oriented to person, place, and time. She appears well-developed and well-nourished. No distress.  HENT:  Head: Normocephalic and atraumatic.  Eyes: Conjunctivae are normal. Right eye exhibits no discharge. Left  eye exhibits no discharge. No scleral icterus.  Neck: Normal range of motion.  Respiratory: Effort normal. No respiratory distress.  Genitourinary: Cervix exhibits discharge (clear mucoid discharge). Cervix exhibits no motion tenderness and no friability. No bleeding in the vagina. No foreign body in the vagina. Vaginal discharge (small amount of thin gray discharge with foul odor) found.  Genitourinary Comments: nuvaring not visualized; able to palpate & remove nuvaring during bimanual exam  Neurological: She is alert and oriented to person, place, and time.  Skin: Skin is warm and dry. She is not  diaphoretic.  Psychiatric: She has a normal mood and affect. Her behavior is normal. Judgment and thought content normal.    MAU Course  Procedures  Results for orders placed or performed during the hospital encounter of 01/29/17 (from the past 24 hour(s))  Urinalysis, Routine w reflex microscopic     Status: Abnormal   Collection Time: 01/29/17  8:46 PM  Result Value Ref Range   Color, Urine YELLOW YELLOW   APPearance HAZY (A) CLEAR   Specific Gravity, Urine 1.032 (H) 1.005 - 1.030   pH 5.0 5.0 - 8.0   Glucose, UA NEGATIVE NEGATIVE mg/dL   Hgb urine dipstick NEGATIVE NEGATIVE   Bilirubin Urine NEGATIVE NEGATIVE   Ketones, ur NEGATIVE NEGATIVE mg/dL   Protein, ur 30 (A) NEGATIVE mg/dL   Nitrite NEGATIVE NEGATIVE   Leukocytes, UA TRACE (A) NEGATIVE   RBC / HPF 0-5 0 - 5 RBC/hpf   WBC, UA 6-30 0 - 5 WBC/hpf   Bacteria, UA NONE SEEN NONE SEEN   Squamous Epithelial / LPF 0-5 (A) NONE SEEN   Mucus PRESENT   Pregnancy, urine POC     Status: None   Collection Time: 01/29/17  9:12 PM  Result Value Ref Range   Preg Test, Ur NEGATIVE NEGATIVE  Wet prep, genital     Status: Abnormal   Collection Time: 01/29/17 11:10 PM  Result Value Ref Range   Yeast Wet Prep HPF POC NONE SEEN NONE SEEN   Trich, Wet Prep NONE SEEN NONE SEEN   Clue Cells Wet Prep HPF POC PRESENT (A) NONE SEEN   WBC, Wet Prep HPF POC MODERATE (A) NONE SEEN   Sperm NONE SEEN   CBC     Status: None   Collection Time: 01/29/17 11:17 PM  Result Value Ref Range   WBC 7.8 4.0 - 10.5 K/uL   RBC 4.51 3.87 - 5.11 MIL/uL   Hemoglobin 13.4 12.0 - 15.0 g/dL   HCT 16.139.3 09.636.0 - 04.546.0 %   MCV 87.1 78.0 - 100.0 fL   MCH 29.7 26.0 - 34.0 pg   MCHC 34.1 30.0 - 36.0 g/dL   RDW 40.914.9 81.111.5 - 91.415.5 %   Platelets 236 150 - 400 K/uL    MDM UPT negative CBC, wet prep, GC/CT toradol 60 mg IM  Assessment and Plan  A: 1. Foreign body in vagina, initial encounter   2. Pregnancy examination or test, negative result   3. BV (bacterial  vaginosis)    P: Discharge home Rx flagyl Discussed reasons to return to MAU Keep follow up appointment with OB/PCP  GC/CT pending   [name] 01/29/2017, 10:56 PM

## 2017-01-29 NOTE — Discharge Instructions (Signed)

## 2017-01-31 LAB — GC/CHLAMYDIA PROBE AMP (~~LOC~~) NOT AT ARMC
Chlamydia: NEGATIVE
NEISSERIA GONORRHEA: NEGATIVE

## 2017-02-11 ENCOUNTER — Telehealth: Payer: Self-pay | Admitting: *Deleted

## 2017-02-11 DIAGNOSIS — B373 Candidiasis of vulva and vagina: Secondary | ICD-10-CM

## 2017-02-11 DIAGNOSIS — Z3044 Encounter for surveillance of vaginal ring hormonal contraceptive device: Secondary | ICD-10-CM

## 2017-02-11 DIAGNOSIS — B3731 Acute candidiasis of vulva and vagina: Secondary | ICD-10-CM

## 2017-02-11 MED ORDER — ETONOGESTREL-ETHINYL ESTRADIOL 0.12-0.015 MG/24HR VA RING: VAGINAL_RING | VAGINAL | 5 refills | Status: DC

## 2017-02-11 MED ORDER — FLUCONAZOLE 150 MG PO TABS: 150.0000 mg | ORAL_TABLET | Freq: Once | ORAL | 1 refills | Status: AC

## 2017-02-11 NOTE — Telephone Encounter (Signed)
Pt called requesting a RF on Nuvaring and states she does have yeast infection and would like Diflucan.  Rf on Nuvaring and Diflucan sent to [facility] on [address].

## 2017-02-11 NOTE — Telephone Encounter (Signed)
-----   Message from Pennie Banter sent at 02/11/2017 11:57 AM EDT ----- Regarding: Rx request Traci Peterson, needs a refill on her Nuvaring and she also has a yeast infection and would like something for this. She states she does not do well with Monostat.  She uses the [facility]. [address] [employer],

## 2017-04-16 ENCOUNTER — Other Ambulatory Visit: Payer: Self-pay

## 2017-04-16 ENCOUNTER — Encounter (HOSPITAL_BASED_OUTPATIENT_CLINIC_OR_DEPARTMENT_OTHER): Payer: Self-pay | Admitting: *Deleted

## 2017-04-16 ENCOUNTER — Emergency Department (HOSPITAL_BASED_OUTPATIENT_CLINIC_OR_DEPARTMENT_OTHER)
Admission: EM | Admit: 2017-04-16 | Discharge: 2017-04-17 | Disposition: A | Discharge status: Home / Self Care | Payer: Self-pay | Attending: Emergency Medicine | Admitting: Emergency Medicine

## 2017-04-16 DIAGNOSIS — G43409 Hemiplegic migraine, not intractable, without status migrainosus: Secondary | ICD-10-CM | POA: Insufficient documentation

## 2017-04-16 DIAGNOSIS — F17200 Nicotine dependence, unspecified, uncomplicated: Secondary | ICD-10-CM | POA: Insufficient documentation

## 2017-04-16 NOTE — ED Triage Notes (Signed)
Migraine headache since this am. Vision is blurry. She has not had a migraine this severe in years so she no longer has Imitrex to take.

## 2017-04-17 ENCOUNTER — Encounter (HOSPITAL_COMMUNITY): Payer: Self-pay

## 2017-04-17 MED ORDER — SUMATRIPTAN SUCCINATE 6 MG/0.5ML ~~LOC~~ SOLN
6.0000 mg | Freq: Once | SUBCUTANEOUS | Status: AC
  Administered 2017-04-17: 6 mg via SUBCUTANEOUS
  Filled 2017-04-17: qty 0.5

## 2017-04-17 MED ORDER — KETOROLAC TROMETHAMINE 15 MG/ML IJ SOLN
15.0000 mg | Freq: Once | INTRAMUSCULAR | Status: AC
  Administered 2017-04-17: 15 mg via INTRAVENOUS
  Filled 2017-04-17: qty 1

## 2017-04-17 MED ORDER — PROMETHAZINE HCL 25 MG/ML IJ SOLN
25.0000 mg | Freq: Once | INTRAMUSCULAR | Status: AC
  Administered 2017-04-17: 25 mg via INTRAVENOUS
  Filled 2017-04-17: qty 1

## 2017-04-17 MED ORDER — SODIUM CHLORIDE 0.9 % IV BOLUS (SEPSIS)
1000.0000 mL | Freq: Once | INTRAVENOUS | Status: AC
  Administered 2017-04-17: 1000 mL via INTRAVENOUS

## 2017-04-17 NOTE — ED Provider Notes (Signed)
   [facility] Provider Note: Lowella DellJ. Lane Gwenyth Dingee, MD, FACEP  CSN: [id number] MRN: [id number] ARRIVAL: 04/16/17 at 2234 ROOM: MH11/MH11   CHIEF COMPLAINT  Migraine   HISTORY OF PRESENT ILLNESS  04/17/17 2:31 AM Traci Peterson is a 31 y.o. female with a history of sporadic migraines.  She is here with migraine that began yesterday morning.  She describes the pain is moderate to severe and throbbing.  It is located on the top in the front of her head.  Pain is similar to previous migraines.  She has not had a migraine this severe in over a year but these usually respond to Imitrex.  She does not currently have any Imitrex.  There is associated photophobia but no focal numbness or weakness.  She has had transient nausea that is improved.  She has not had vomiting.   History reviewed. No pertinent past medical history.  Past Surgical History:  Procedure Laterality Date  . CHOLECYSTECTOMY      No family history on file.  Social History   Tobacco Use  . Smoking status: Current Every Day Smoker  . Smokeless tobacco: Never Used  Substance Use Topics  . Alcohol use: Yes  . Drug use: No    Prior to Admission medications   Not on File    Allergies Patient has no known allergies.   REVIEW OF SYSTEMS  Negative except as noted here or in the History of Present Illness.   PHYSICAL EXAMINATION  Initial Vital Signs Blood pressure (!) 104/58, pulse 80, temperature 98.3 F (36.8 C), temperature source Oral, resp. rate 18, height 5\' 2"  (1.575 m), weight 111.6 kg (246 lb), SpO2 100 %.  Examination General: Well-developed, well-nourished female in no acute distress; appearance consistent with age of record HENT: normocephalic; atraumatic Eyes: pupils equal, round and reactive to light; extraocular muscles intact; photophobia Neck: supple Heart: regular rate and rhythm Lungs: clear to auscultation bilaterally Abdomen: soft; nondistended; nontender; bowel sounds  present Extremities: No deformity; full range of motion; pulses normal Neurologic: Awake, alert and oriented; motor function intact in all extremities and symmetric; no facial droop Skin: Warm and dry Psychiatric: Normal mood and affect   RESULTS  Summary of this visit's results, reviewed by myself:   EKG Interpretation  Date/Time:    Ventricular Rate:    PR Interval:    QRS Duration:   QT Interval:    QTC Calculation:   R Axis:     Text Interpretation:        Laboratory Studies: No results found for this or any previous visit (from the past 24 hour(s)). Imaging Studies: No results found.  ED COURSE  Nursing notes and initial vitals signs, including pulse oximetry, reviewed.  Vitals:   04/16/17 2238 04/16/17 2240 04/17/17 0103 04/17/17 0507  BP:  108/84 (!) 104/58 105/62  Pulse:  85 80 75  Resp:  16 18 18   Temp:  98.3 F (36.8 C)    TempSrc:  Oral    SpO2:  100% 100% 100%  Weight: 111.6 kg (246 lb)     Height: 5\' 2"  (1.575 m)      5:11 AM Headache relieved after IV medications.  PROCEDURES    ED DIAGNOSES     ICD-10-CM   1. Sporadic migraine G43.409        Delno Blaisdell, Jonny RuizJohn, MD 04/17/17 [phone]

## 2017-04-17 NOTE — ED Notes (Signed)
C/o ha onset yesterday had some nausea but cleared after eating,  No relief w otc meds

## 2017-05-28 ENCOUNTER — Encounter (HOSPITAL_COMMUNITY): Payer: Self-pay

## 2017-05-28 ENCOUNTER — Other Ambulatory Visit: Payer: Self-pay

## 2017-05-28 ENCOUNTER — Emergency Department (HOSPITAL_COMMUNITY)
Admission: EM | Admit: 2017-05-28 | Discharge: 2017-05-29 | Disposition: A | Discharge status: Other Acute Inpatient Hospital | Payer: 59 | Attending: Physician Assistant | Admitting: Physician Assistant

## 2017-05-28 DIAGNOSIS — F17200 Nicotine dependence, unspecified, uncomplicated: Secondary | ICD-10-CM | POA: Diagnosis not present

## 2017-05-28 DIAGNOSIS — Z046 Encounter for general psychiatric examination, requested by authority: Secondary | ICD-10-CM | POA: Diagnosis not present

## 2017-05-28 DIAGNOSIS — R4589 Other symptoms and signs involving emotional state: Secondary | ICD-10-CM | POA: Diagnosis not present

## 2017-05-28 DIAGNOSIS — F329 Major depressive disorder, single episode, unspecified: Secondary | ICD-10-CM | POA: Diagnosis present

## 2017-05-28 LAB — SALICYLATE LEVEL

## 2017-05-28 LAB — COMPREHENSIVE METABOLIC PANEL
ALT: 57 U/L — AB (ref 14–54)
AST: 35 U/L (ref 15–41)
Albumin: 4 g/dL (ref 3.5–5.0)
Alkaline Phosphatase: 71 U/L (ref 38–126)
Anion gap: 5 (ref 5–15)
BILIRUBIN TOTAL: 0.6 mg/dL (ref 0.3–1.2)
BUN: 9 mg/dL (ref 6–20)
CO2: 27 mmol/L (ref 22–32)
CREATININE: 0.89 mg/dL (ref 0.44–1.00)
Calcium: 9.5 mg/dL (ref 8.9–10.3)
Chloride: 108 mmol/L (ref 101–111)
Glucose, Bld: 110 mg/dL — ABNORMAL HIGH (ref 65–99)
Potassium: 3.8 mmol/L (ref 3.5–5.1)
Sodium: 140 mmol/L (ref 135–145)
TOTAL PROTEIN: 7.9 g/dL (ref 6.5–8.1)

## 2017-05-28 LAB — I-STAT BETA HCG BLOOD, ED (MC, WL, AP ONLY): I-stat hCG, quantitative: <5 m[IU]/mL (ref <5)

## 2017-05-28 LAB — RAPID URINE DRUG SCREEN, HOSP PERFORMED
AMPHETAMINES: NOT DETECTED
Barbiturates: NOT DETECTED
Benzodiazepines: NOT DETECTED
Cocaine: NOT DETECTED
Opiates: NOT DETECTED
Tetrahydrocannabinol: NOT DETECTED

## 2017-05-28 LAB — CBC
HCT: 43.2 % (ref 36.0–46.0)
Hemoglobin: 14.5 g/dL (ref 12.0–15.0)
MCH: 29.9 pg (ref 26.0–34.0)
MCHC: 33.6 g/dL (ref 30.0–36.0)
MCV: 89.1 fL (ref 78.0–100.0)
PLATELETS: 290 10*3/uL (ref 150–400)
RBC: 4.85 MIL/uL (ref 3.87–5.11)
RDW: 14 % (ref 11.5–15.5)
WBC: 8.5 10*3/uL (ref 4.0–10.5)

## 2017-05-28 LAB — ACETAMINOPHEN LEVEL: Acetaminophen (Tylenol), Serum: <10 ug/mL — ABNORMAL LOW (ref 10–30)

## 2017-05-28 LAB — ETHANOL

## 2017-05-28 MED ORDER — LORAZEPAM 2 MG/ML IJ SOLN: 0.0000 mg | Freq: Four times a day (QID) | INTRAMUSCULAR | Status: DC

## 2017-05-28 MED ORDER — NICOTINE 21 MG/24HR TD PT24
21.0000 mg | MEDICATED_PATCH | Freq: Every day | TRANSDERMAL | Status: DC
  Administered 2017-05-28: 21 mg via TRANSDERMAL
  Filled 2017-05-28: qty 1

## 2017-05-28 MED ORDER — LORAZEPAM 1 MG PO TABS
0.0000 mg | ORAL_TABLET | Freq: Four times a day (QID) | ORAL | Status: DC
  Administered 2017-05-28: 1 mg via ORAL
  Filled 2017-05-28: qty 1

## 2017-05-28 MED ORDER — THIAMINE HCL 100 MG/ML IJ SOLN: 100.0000 mg | Freq: Every day | INTRAMUSCULAR | Status: DC

## 2017-05-28 MED ORDER — VITAMIN B-1 100 MG PO TABS
100.0000 mg | ORAL_TABLET | Freq: Every day | ORAL | Status: DC
  Administered 2017-05-28: 100 mg via ORAL
  Filled 2017-05-28: qty 1

## 2017-05-28 MED ORDER — LORAZEPAM 1 MG PO TABS: 0.0000 mg | ORAL_TABLET | Freq: Two times a day (BID) | ORAL | Status: DC

## 2017-05-28 MED ORDER — LORAZEPAM 2 MG/ML IJ SOLN: 0.0000 mg | Freq: Two times a day (BID) | INTRAMUSCULAR | Status: DC

## 2017-05-28 NOTE — BH Assessment (Signed)
[facility] Assessment Progress Note  Pt is recommended for inpt treatment per Donell SievertSpencer Simon, PA. [facility] is currently reviewing for possible admission. Pt's nurse Joanie CoddingtonLatricia, RN made aware of recommendation. TTS attempted to contact EDP Joy, Shawn C, PA-C to advise of disposition but line routes to the Secondary school teachernurse secretary.   Princess BruinsAquicha Jonesha Tsuchiya, MSW, LCSW Therapeutic Triage Specialist  [phone]

## 2017-05-28 NOTE — BH Assessment (Signed)
Attempted tele-assessment using [facility] tele[facility]. Camera does not work. [facility].   Harlin RainFord Ellis Patsy BaltimoreWarrick Jr, LPC, [facility], [facility] Triage Specialist [phone]

## 2017-05-28 NOTE — ED Provider Notes (Signed)
[facility]-EMERGENCY DEPT Provider Note   CSN: [id number] Arrival date & time: 05/28/17  1709     History   Chief Complaint Chief Complaint  Patient presents with  . Medical Clearance    HPI Traci Peterson is a 32 y.o. female.  HPI    Traci Peterson is a 32 y.o. female, with a history of depression, presenting to the ED with increased use of alcohol and sleeping pills. States, "I found out my husband has a sex addiction and just got another girl pregnant." Also had a miscarriage of twins this year. States she has been using alcohol and sleeping pills (12mg  melatonin, NyQuil) to try to sleep and cope with recent stressors.  Has been drinking liquor, today drank half of a fifth. Has been in a cycle of sleeping pills and alcohol, sleep, wake up, repeat.  Before the past week, she would typically take "a few swigs of alcohol" at night when she got home from work.  Usually smokes about a pack of cigarettes per week, now smoking almost a pack a day.  Denies illicit drug use.  "I feel like I've been in a downward spiral and feels myself getting worse. I want to get some help before it's too late.  "Feel like there is no one that can relate to what I am going through.  I'm scared."  Not currently being treated for depression.  States she feels as though she has a good support system in her family.  Denies SI/HI.   Denies current physical complaints.    Past Medical History:  Diagnosis Date  . Anxiety    no current med.  . Cervix prolapsed into vagina   . Chronic headaches   . Gall stones   . HPV in female   . Jaw snapping    states jaw pops if opens mouth too wide  . Ovarian cyst   . PCOS (polycystic ovarian syndrome)   . Pilonidal cyst 02/2013  . Preterm labor 2016   PPROM @ 18 wks, delivery of IUFD @ 21 wks  . Vaginal Pap smear, abnormal     Patient Active Problem List   Diagnosis Date Noted  . Incompetent cervix 09/12/2016  . Gall stones  07/19/2016  . Family history of breast cancer in female 06/06/2016  . Tinea pedis, recurrent 08/08/2015  . Pernicious anemia 08/08/2015  . Morbidly obese (HCC) 10/25/2014  . Eczema 10/25/2014  . 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 Chronic headaches -  no meds 10/25/2014  . Anxiety and depression 11/30/2013  . HPV in female 05/08/2013  . PCOS (polycystic ovarian syndrome) 04/20/2013    Past Surgical History:  Procedure Laterality Date  . CHOLECYSTECTOMY N/A 07/21/2016   Procedure: LAPAROSCOPIC CHOLECYSTECTOMY;  Surgeon: Abigail Miyamoto, MD;  Location: [facility] OR;  Service: General;  Laterality: N/A;  . CHOLECYSTECTOMY    . PILONIDAL CYST EXCISION N/A 03/12/2013   Procedure: CYST EXCISION PILONIDAL EXTENSIVE;  Surgeon: Shelly Rubenstein, MD;  Location: [facility];  Service: General;  Laterality: N/A;  . WISDOM TOOTH EXTRACTION      OB History    Gravida Para Term Preterm AB Living   2 2 0 2 0     SAB TAB Ectopic Multiple Live Births   0 0 0 1 2       Home Medications    Prior to Admission medications  Medication Sig Start Date End Date Taking? Authorizing Provider  acetaminophen (TYLENOL) 500 MG tablet Take 1,500 mg by mouth every 6 (six) hours as needed for moderate pain.   Yes [provider]  aspirin-acetaminophen-caffeine (EXCEDRIN MIGRAINE) [phone] MG tablet Take 3 tablets by mouth every 6 (six) hours as needed for headache.   Yes [provider]  etonogestrel-ethinyl estradiol (NUVARING) 0.12-0.015 MG/24HR vaginal ring Insert vaginally and leave in place for 3 consecutive weeks, then remove for 1 week. Patient not taking: Reported on 05/28/2017 02/11/17   Leftwich-Kirby, Wilmer Floor, CNM  metroNIDAZOLE (FLAGYL) 500 MG tablet Take 1 tablet (500 mg total) by mouth 2 (two) times daily. Patient not taking: Reported on 05/28/2017 01/29/17   Judeth Horn, NP    Family  History Family History  Problem Relation Age of Onset  . Hypertension Mother   . Hyperlipidemia Mother   . Diabetes Mother   . Thyroid disease Mother   . Diabetes Father   . Hypertension Father   . Alcohol abuse Father   . Breast cancer Maternal Aunt   . Lung cancer Paternal Grandfather     Social History Social History   Tobacco Use  . Smoking status: Current Every Day Smoker  . Smokeless tobacco: Never Used  . Tobacco comment: 3-5 cig./day  Substance Use Topics  . Alcohol use: Yes    Comment: socially  . Drug use: No     Allergies   Lactose intolerance (gi)   Review of Systems Review of Systems  Constitutional: Negative for chills and fever.  Gastrointestinal: Negative for nausea and vomiting.  Psychiatric/Behavioral: Positive for dysphoric mood and sleep disturbance. Negative for self-injury and suicidal ideas. The patient is not nervous/anxious.   All other systems reviewed and are negative.    Physical Exam Updated Vital Signs BP 121/86 (BP Location: Left Arm)   Pulse 87   Temp 98.1 F (36.7 C) (Oral)   Resp 18   SpO2 100%   Physical Exam  Constitutional: She appears well-developed and well-nourished. No distress.  HENT:  Head: Normocephalic and atraumatic.  Eyes: Conjunctivae are normal.  Neck: Neck supple.  Cardiovascular: Normal rate, regular rhythm, normal heart sounds and intact distal pulses.  Pulmonary/Chest: Effort normal and breath sounds normal. No respiratory distress.  Abdominal: Soft. There is no tenderness. There is no guarding.  Musculoskeletal: She exhibits no edema.  Lymphadenopathy:    She has no cervical adenopathy.  Neurological: She is alert.  Skin: Skin is warm and dry. She is not diaphoretic.  Psychiatric: Her behavior is normal.  Dysphoric mood  Nursing note and vitals reviewed.    ED Treatments / Results  Labs (all labs ordered are listed, but only abnormal results are displayed) Labs Reviewed  COMPREHENSIVE  METABOLIC PANEL - Abnormal; Notable for the following components:      Result Value   Glucose, Bld 110 (*)    ALT 57 (*)    All other components within normal limits  ACETAMINOPHEN LEVEL - Abnormal; Notable for the following components:   Acetaminophen (Tylenol), Serum <10 (*)    All other components within normal limits  ETHANOL  SALICYLATE LEVEL  CBC  RAPID URINE DRUG SCREEN, HOSP PERFORMED  I-STAT BETA HCG BLOOD, ED ([facility], [facility], [facility] ONLY)    EKG  EKG Interpretation None       Radiology No results found.  Procedures Procedures (including critical care time)  Medications Ordered in ED Medications  LORazepam (ATIVAN) injection 0-4 mg ( Intravenous See  Alternative 05/28/17 2116)    Or  LORazepam (ATIVAN) tablet 0-4 mg (1 mg Oral Given 05/28/17 2116)  LORazepam (ATIVAN) injection 0-4 mg (not administered)    Or  LORazepam (ATIVAN) tablet 0-4 mg (not administered)  thiamine (VITAMIN B-1) tablet 100 mg (100 mg Oral Given 05/28/17 2116)    Or  thiamine (B-1) injection 100 mg ( Intravenous See Alternative 05/28/17 2116)  nicotine (NICODERM CQ - dosed in mg/24 hours) patch 21 mg (21 mg Transdermal Patch Applied 05/28/17 2115)     Initial Impression / Assessment and Plan / ED Course  I have reviewed the triage vital signs and the nursing notes.  Pertinent labs & imaging results that were available during my care of the patient were reviewed by me and considered in my medical decision making (see chart for details).  Clinical Course as of May 29 2315  Tue May 28, 2017  2304 Patient has been recommended for inpatient treatment.  [facility]avor[facility]bos.  [SJ]    Clinical Course User Index [SJ] Joy, Shawn C, PA-C     Patient presents for assistance due to her self-destructive behavior.  She denies SI, however, states she is scared as to what she would do if she continues on her current path. Patient medically cleared. No home medications to order. Patient indicates  she would rather have STD evaluation after her current complaints are addressed. Patient advised to make staff aware should she change her mind.     Final Clinical Impressions(s) / ED Diagnoses   Final diagnoses:  Dysphoric mood    ED Discharge Orders    None       Concepcion LivingJoy, Shawn C, PA-C 05/28/17 2319    Abelino DerrickMackuen, Courteney Lyn, MD 06/01/17 [phone]

## 2017-05-28 NOTE — Progress Notes (Signed)
[facility] after 23:00. Attending will be Dr. Jama Flavorsobos, MD, Call to report [phone]. Voluntary [facility] to be completed and [facility]. Pt's nurse Joanie CoddingtonLatricia, RN has been advised of acceptance.   Traci Peterson, MSW, LCSW Therapeutic Triage Specialist  [phone]

## 2017-05-28 NOTE — Progress Notes (Signed)
[facility].  Princess BruinsAquicha Raygen Dahm, MSW, LCSW Therapeutic Triage Specialist  [phone]

## 2017-05-28 NOTE — ED Triage Notes (Addendum)
Pt reports that she recently found out that her husband has been cheating on her and she had a recent death in the family. She has been under a lot of stress and states that she has been coping by using alcohol and sleeping pills. She states that she knows it is not healthy and wants help. She states that she does not want to kill herself, but she feels numb and "wants to hide." A&Ox4. She is tearful, cooperative, and ambulatory.

## 2017-05-28 NOTE — ED Notes (Signed)
Bed: WLPT4 Expected date:  Expected time:  Means of arrival:  Comments: 

## 2017-05-28 NOTE — ED Notes (Signed)
Report called to RN Kathlene NovemberMike, [facility].  Pending [facility] transport after 11pm shift change.
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
## 2017-05-29 ENCOUNTER — Encounter (HOSPITAL_COMMUNITY): Payer: Self-pay

## 2017-05-29 ENCOUNTER — Inpatient Hospital Stay (HOSPITAL_COMMUNITY)
Admission: AD | Admit: 2017-05-29 | Discharge: 2017-05-31 | DRG: 885 | Disposition: A | Discharge status: Home / Self Care | Payer: 59 | Source: Intra-hospital | Attending: Psychiatry | Admitting: Psychiatry

## 2017-05-29 DIAGNOSIS — R45851 Suicidal ideations: Secondary | ICD-10-CM | POA: Diagnosis present

## 2017-05-29 DIAGNOSIS — F17210 Nicotine dependence, cigarettes, uncomplicated: Secondary | ICD-10-CM | POA: Diagnosis present

## 2017-05-29 DIAGNOSIS — Z23 Encounter for immunization: Secondary | ICD-10-CM

## 2017-05-29 DIAGNOSIS — K5900 Constipation, unspecified: Secondary | ICD-10-CM | POA: Diagnosis not present

## 2017-05-29 DIAGNOSIS — G4700 Insomnia, unspecified: Secondary | ICD-10-CM | POA: Diagnosis present

## 2017-05-29 DIAGNOSIS — Z915 Personal history of self-harm: Secondary | ICD-10-CM

## 2017-05-29 DIAGNOSIS — E739 Lactose intolerance, unspecified: Secondary | ICD-10-CM | POA: Diagnosis present

## 2017-05-29 DIAGNOSIS — F1099 Alcohol use, unspecified with unspecified alcohol-induced disorder: Secondary | ICD-10-CM | POA: Diagnosis not present

## 2017-05-29 DIAGNOSIS — Z818 Family history of other mental and behavioral disorders: Secondary | ICD-10-CM

## 2017-05-29 DIAGNOSIS — Z630 Problems in relationship with spouse or partner: Secondary | ICD-10-CM

## 2017-05-29 DIAGNOSIS — Z811 Family history of alcohol abuse and dependence: Secondary | ICD-10-CM

## 2017-05-29 DIAGNOSIS — F332 Major depressive disorder, recurrent severe without psychotic features: Secondary | ICD-10-CM | POA: Diagnosis present

## 2017-05-29 DIAGNOSIS — F39 Unspecified mood [affective] disorder: Secondary | ICD-10-CM | POA: Diagnosis not present

## 2017-05-29 DIAGNOSIS — F419 Anxiety disorder, unspecified: Secondary | ICD-10-CM | POA: Diagnosis present

## 2017-05-29 MED ORDER — TRAZODONE HCL 50 MG PO TABS
50.0000 mg | ORAL_TABLET | Freq: Every evening | ORAL | Status: DC | PRN
  Administered 2017-05-29 – 2017-05-30 (×3): 50 mg via ORAL
  Filled 2017-05-29 (×8): qty 1

## 2017-05-29 MED ORDER — NICOTINE 21 MG/24HR TD PT24
21.0000 mg | MEDICATED_PATCH | Freq: Every day | TRANSDERMAL | Status: DC
  Administered 2017-05-29 – 2017-05-31 (×3): 21 mg via TRANSDERMAL
  Filled 2017-05-29 (×4): qty 1

## 2017-05-29 MED ORDER — TRAZODONE HCL 50 MG PO TABS: 50.0000 mg | ORAL_TABLET | Freq: Every evening | ORAL | Status: DC | PRN

## 2017-05-29 MED ORDER — ACETAMINOPHEN 325 MG PO TABS
650.0000 mg | ORAL_TABLET | Freq: Four times a day (QID) | ORAL | Status: DC | PRN
  Administered 2017-05-29 – 2017-05-31 (×5): 650 mg via ORAL
  Filled 2017-05-29 (×5): qty 2

## 2017-05-29 MED ORDER — MAGNESIUM HYDROXIDE 400 MG/5ML PO SUSP
30.0000 mL | Freq: Every day | ORAL | Status: DC | PRN
  Administered 2017-05-30: 30 mL via ORAL
  Filled 2017-05-29: qty 30

## 2017-05-29 MED ORDER — INFLUENZA VAC SPLIT QUAD 0.5 ML IM SUSY
0.5000 mL | PREFILLED_SYRINGE | INTRAMUSCULAR | Status: AC
  Administered 2017-05-30: 0.5 mL via INTRAMUSCULAR
  Filled 2017-05-29: qty 0.5

## 2017-05-29 MED ORDER — ALUM & MAG HYDROXIDE-SIMETH 200-200-20 MG/5ML PO SUSP: 30.0000 mL | ORAL | Status: DC | PRN

## 2017-05-29 MED ORDER — HYDROXYZINE HCL 25 MG PO TABS
25.0000 mg | ORAL_TABLET | Freq: Four times a day (QID) | ORAL | Status: DC | PRN
  Administered 2017-05-30 – 2017-05-31 (×2): 25 mg via ORAL
  Filled 2017-05-29 (×2): qty 1

## 2017-05-29 MED ORDER — FLUOXETINE HCL 10 MG PO CAPS
10.0000 mg | ORAL_CAPSULE | Freq: Once | ORAL | Status: AC
  Administered 2017-05-29: 10 mg via ORAL
  Filled 2017-05-29: qty 1

## 2017-05-29 MED ORDER — FLUOXETINE HCL 20 MG PO CAPS
20.0000 mg | ORAL_CAPSULE | Freq: Every day | ORAL | Status: DC
  Administered 2017-05-30 – 2017-05-31 (×2): 20 mg via ORAL
  Filled 2017-05-29 (×4): qty 1

## 2017-05-29 NOTE — [facility] Suicide Risk Assessment (Signed)
[facility] INPATIENT:  Family/Significant Other Suicide Prevention Education  Suicide Prevention Education:  Education Completed; NO ONE has been identified by the patient as the family member/significant other with whom the patient will be residing, and identified as the person(s) who will aid the patient in the event of a mental health crisis (suicidal ideations/suicide attempt).  With written consent from the patient, the family member/significant other has been provided the following suicide prevention education, prior to the and/or following the discharge of the patient.  The suicide prevention education provided includes the following:  Suicide risk factors  Suicide prevention and interventions  National Suicide Hotline telephone number  [facility] assessment telephone number  [facility] Emergency Assistance 911  [facility] and/or Residential Mobile Crisis Unit telephone number  Request made of family/significant other to:  Remove weapons (e.g., guns, rifles, knives), all items previously/currently identified as safety concern.    Remove drugs/medications (over-the-counter, prescriptions, illicit drugs), all items previously/currently identified as a safety concern.  The family member/significant other verbalizes understanding of the suicide prevention education information provided.  The family member/significant other agrees to remove the items of safety concern listed above.  The patient did not endorse any suicidal ideation prior to or during her stay at the hospital. No one was called for suicide prevention education.   Ida RogueRodney B Cameran Ahmed 05/29/2017, 10:33 AM

## 2017-05-29 NOTE — Progress Notes (Signed)
ADMISSION NOTE:  Traci Peterson (Prefers to be called [name])  is an 32 y.o. female who presented to the ED voluntarily on 05/28/17. Pt reports she has been experiencing worsening depression due to several stressors. Pt states she recently learned that her husband "has a sex addiction" and he got another woman pregnant. Pt states she has been contacted by several women who claim to have had unprotected sex with her husband. Pt states her husband admitted to her that he got another woman pregnant. Pt states she became pregnant with her first child in 2016 and while she was pregnant she contracted gonorrhea. Pt states she had a miscarriage of her first child. Pt states recently lost a set of twins in April of 2018. Pt states her great aunt recently passed away ( this last Saturday was buryed) and she has not been able to mourn her death due to the issues with her husband. Pt states she has been consuming alcohol and mixing it with sleeping pills. Pt denies that she is intending to kill herself when she does this, however she states she is aware that it is dangerous. Pt states she mixes sleeping pills with alcohol in order to "enhance the effect of the pills." Pt reports she has been using alcohol and sleeping pills as a way to cope with her stress. Pt states she feels she is alone, hopeless, and has no one who understands her pain. 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  Pt is crying on and off during assessment and sts she is afraid to be here and does not know what to expect.  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  Pt sts her husband is seeking help in their church with this addiction. Pt reports when she was an adolescent she attempted to kill herself by ingesting an entire bottle of pepto bismol. Pt states she used to  cut herself when she was a teenager but denies any recent self-harm. Pt states she has not been sleeping or eating and feels like she is "sinking into a hole that she cannot get out of."  Pt presents with multiple risk factors for suicide including hx of suicide attempt as a teenager, hx of childhood trauma, recent loss, depression, and access to lethal methods.   PRN order obtained for sleep.  Pt currently in her room in bed. 15 min checks in place for patient safety.

## 2017-05-29 NOTE — Progress Notes (Signed)
The patient shared in group that she had a meaningful visit with her husband this evening. She admits that their are issues between her and her spouse and that her husband is willing to talk about their issues. Her goal for tomorrow is to get discharged and have her FM [place] paperwork completed. In addition, she intends to find a counselor who can provided her with outpatient therapy.

## 2017-05-29 NOTE — Tx Team (Signed)
Interdisciplinary Treatment and Diagnostic Plan Update  05/29/2017 Time of Session: [place] MRN: [id number]  Principal Diagnosis: <principal problem not specified>  Secondary Diagnoses: Active Problems:   MDD (major depressive disorder), recurrent episode, severe (HCC)   Current Medications:  Current Facility-Administered Medications  Medication Dose Route Frequency Provider Last Rate Last Dose  . acetaminophen (TYLENOL) tablet 650 mg  650 mg Oral Q6H PRN Laverle Hobby, PA-C   650 mg at 05/29/17 0947  . alum & mag hydroxide-simeth (MAALOX/MYLANTA) 200-200-20 MG/5ML suspension 30 mL  30 mL Oral Q4H PRN Laverle Hobby, PA-C      . hydrOXYzine (ATARAX/VISTARIL) tablet 25 mg  25 mg Oral Q6H PRN Laverle Hobby, PA-C      . [START ON 05/30/2017] Influenza vac split quadrivalent PF (FLUARIX) injection 0.5 mL  0.5 mL Intramuscular Tomorrow-1000 Simon, Spencer E, PA-C      . magnesium hydroxide (MILK OF MAGNESIA) suspension 30 mL  30 mL Oral Daily PRN Patriciaann Clan E, PA-C      . nicotine (NICODERM CQ - dosed in mg/24 hours) patch 21 mg  21 mg Transdermal Daily Patriciaann Clan E, PA-C   21 mg at 05/29/17 1004  . traZODone (DESYREL) tablet 50 mg  50 mg Oral QHS,MR X 1 Laverle Hobby, PA-C   50 mg at 05/29/17 0962   PTA Medications: Medications Prior to Admission  Medication Sig Dispense Refill Last Dose  . acetaminophen (TYLENOL) 500 MG tablet Take 1,500 mg by mouth every 6 (six) hours as needed for moderate pain.   05/27/2017 at Unknown time  . aspirin-acetaminophen-caffeine (EXCEDRIN MIGRAINE) 250-250-65 MG tablet Take 3 tablets by mouth every 6 (six) hours as needed for headache.   Past Month at Unknown time  . etonogestrel-ethinyl estradiol (NUVARING) 0.12-0.015 MG/24HR vaginal ring Insert vaginally and leave in place for 3 consecutive weeks, then remove for 1 week. (Patient not taking: Reported on 05/28/2017) 1 each 5 Not Taking at Unknown time  . metroNIDAZOLE (FLAGYL) 500  MG tablet Take 1 tablet (500 mg total) by mouth 2 (two) times daily. (Patient not taking: Reported on 05/28/2017) 14 tablet 0 Not Taking at Unknown time    Patient Stressors: Marital or family conflict Occupational concerns  Patient Strengths: Average or above average intelligence Capable of independent living Child psychotherapist Motivation for treatment/growth Religious Affiliation Supportive family/friends Work skills  Treatment Modalities: Medication Management, Group therapy, Case management,  1 to 1 session with clinician, Psychoeducation, Recreational therapy.   Physician Treatment Plan for Primary Diagnosis: <principal problem not specified> Long Term Goal(s):     Short Term Goals:    Medication Management: Evaluate patient's response, side effects, and tolerance of medication regimen.  Therapeutic Interventions: 1 to 1 sessions, Unit Group sessions and Medication administration.  Evaluation of Outcomes: Not Met  Physician Treatment Plan for Secondary Diagnosis: Active Problems:   MDD (major depressive disorder), recurrent episode, severe ([place])  Long Term Goal(s):     Short Term Goals:       Medication Management: Evaluate patient's response, side effects, and tolerance of medication regimen.  Therapeutic Interventions: 1 to 1 sessions, Unit Group sessions and Medication administration.  Evaluation of Outcomes: Not Met   RN Treatment Plan for Primary Diagnosis: <principal problem not specified> Long Term Goal(s): Knowledge of disease and therapeutic regimen to maintain health will improve  Short Term Goals: Ability to identify and develop effective coping behaviors will improve and Compliance with prescribed medications will improve  Medication Management: RN will administer medications as ordered by provider, will assess and evaluate patient's response and provide education to patient for prescribed medication. RN will report any adverse and/or  side effects to prescribing provider.  Therapeutic Interventions: 1 on 1 counseling sessions, Psychoeducation, Medication administration, Evaluate responses to treatment, Monitor vital signs and CBGs as ordered, Perform/monitor CIWA, COWS, AIMS and Fall Risk screenings as ordered, Perform wound care treatments as ordered.  Evaluation of Outcomes: Not Met   LCSW Treatment Plan for Primary Diagnosis: <principal problem not specified> Long Term Goal(s): Safe transition to appropriate next level of care at discharge, Engage patient in therapeutic group addressing interpersonal concerns.  Short Term Goals: Engage patient in aftercare planning with referrals and resources, Increase social support and Increase skills for wellness and recovery  Therapeutic Interventions: Assess for all discharge needs, 1 to 1 time with Social worker, Explore available resources and support systems, Assess for adequacy in community support network, Educate family and significant other(s) on suicide prevention, Complete Psychosocial Assessment, Interpersonal group therapy.  Evaluation of Outcomes: Not Met   Progress in Treatment: Attending groups: No. Participating in groups: No. Taking medication as prescribed: Yes. Toleration medication: Yes. Family/Significant other contact made: No, will contact:  pt declined Patient understands diagnosis: Yes. Discussing patient identified problems/goals with staff: Yes. Medical problems stabilized or resolved: Yes. Denies suicidal/homicidal ideation: Yes. Issues/concerns per patient self-inventory: No. Other: none  New problem(s) identified: No, Describe:  none  New Short Term/Long Term Goal(s):  Discharge Plan or Barriers:   Reason for Continuation of Hospitalization: Depression Medication stabilization  Estimated Length of Stay: 3-5 days.  Attendees: Patient: 05/29/2017   Physician: Dr Larene Beach, MD 05/29/2017   Nursing: Loletta Specter, RN 05/29/2017   RN Care  Manager: 05/29/2017   Social Worker: Lurline Idol, LCSW 05/29/2017   Recreational Therapist:  05/29/2017   Other:  05/29/2017   Other:  05/29/2017   Other: 05/29/2017      Scribe for Treatment Team: Joanne Chars, [place] 05/29/2017 2:35 PM
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
## 2017-05-29 NOTE — BHH Suicide Risk Assessment (Signed)
[facility] Admission Suicide Risk Assessment   Nursing information obtained from:    Demographic factors:    Current Mental Status:    Loss Factors:    Historical Factors:    Risk Reduction Factors:     Total Time spent with patient: 30 minutes Principal Problem: <principal problem not specified> Diagnosis:   Patient Active Problem List   Diagnosis Date Noted  . MDD (major depressive disorder), recurrent episode, severe (HCC) [F33.2] 05/29/2017  . Incompetent cervix [N88.3] 09/12/2016  . Gall stones [K80.20] 07/19/2016  . Family history of breast cancer in female [Z80.3] 06/06/2016  . Tinea pedis, recurrent [B35.3] 08/08/2015  . Pernicious anemia [D51.0] 08/08/2015  . Morbidly obese (HCC) [E66.01] 10/25/2014  . Eczema [L30.9] 10/25/2014  . 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 -- orange, yellow 6 and red food colorings - moderate to severe respiratory distress [Z91.02] 10/25/2014  . Chronic headaches -  no meds [R51] 10/25/2014  . Anxiety and depression [F41.9, F32.9] 11/30/2013  . HPV in female [B97.7] 05/08/2013  . PCOS (polycystic ovarian syndrome) [E28.2] 04/20/2013   Subjective Data:  [name], married, lives with her husband, employed as a Lawyer. Background history of Unipolar depression. Self presented to the ER unaccompanied. Reports worsening depression associated with suicidal thoughts. Patient has not been coping well. She has used alcohol and pills to cope. She reports she just found out that her husband has sex addiction. She just found out another woman is about to have his baby. She has been contacted by many women who have been sleeping with her husband. Patient has been ruminating on recent losses. She lost two pregnancies recently. Her aunt passed recently.  Routine labs is significant for strained liver enzymes, toxicology is negative,  UDS is negative , BAL ,10 mg/dl. single past suicidal behavior, no family history of suicide, no evidence of psychosis. No evidence of mania. No cognitive  impairment. No access to weapons. She is cooperative with care. She has agreed to treatment recommendations. She has agreed to communicate suicidal thoughts of with staff if the thoughts becomes overwhelming.      Continued Clinical Symptoms:    The "Alcohol Use Disorders Identification Test", Guidelines for Use in Primary Care, Second Edition.  World Science writer [facility]). Score between 0-7:  no or low risk or alcohol related problems. Score between 8-15:  moderate risk of alcohol related problems. Score between 16-19:  high risk of alcohol related problems. Score 20 or above:  warrants further diagnostic evaluation for alcohol dependence and treatment.   CLINICAL FACTORS:   Depression:   Severe   Musculoskeletal: Strength & Muscle Tone: within normal limits Gait & Station: normal Patient leans: N/A  Psychiatric Specialty Exam: Physical Exam  ROS  Blood pressure 103/61, pulse (!) 111, temperature 98.8 F (37.1 C), temperature source Oral, resp. rate 18, height 5\' 2"  (1.575 m), weight 112.5 kg (248 lb), last menstrual period 05/28/2017, SpO2 99 %, not currently breastfeeding.Body mass index is 45.36 kg/m.  General Appearance: As in H&P  Eye Contact:    Speech:    Volume:    Mood:    Affect:    Thought Process:    Orientation:    Thought Content:  As in H&P  Suicidal Thoughts:    Homicidal Thoughts:    Memory:    Judgement:    Insight:    Psychomotor Activity:    Concentration:    Recall:    Fund of Knowledge:    Language:  As in H&P  Akathisia:    Handed:    AIMS (if indicated):     Assets:    ADL's:    Cognition:  As in H&P  Sleep:  Number of Hours: 3.5      COGNITIVE FEATURES THAT CONTRIBUTE TO RISK:  None    SUICIDE RISK:   Minimal: No identifiable suicidal ideation.  Patients presenting with no risk factors but with morbid ruminations; may be classified as minimal risk based on the severity of the depressive symptoms  PLAN OF CARE:  As in  H&P  I certify that inpatient services furnished can reasonably be expected to improve the patient's condition.   Traci CockerVincent A Jenascia Bumpass, MD 05/29/2017, 3:16 PM
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
## 2017-05-29 NOTE — Progress Notes (Signed)
Recreation Therapy Notes  Date: 05/29/17 Time: 0930 Location: [address] Dayroom  Group Topic: Stress Management  Goal Area(s) Addresses:  Patient will verbalize importance of using healthy stress management.  Patient will identify positive emotions associated with healthy stress management.   Intervention: Stress Management  Activity :  Body Scan Meditation.  LRT introduced the stress management technique of meditation.  LRT played Peterson script that guided patients through Peterson body scan that allowed patients to become aware of any sensations they may have been experiencing.  Education:  Stress Management, Discharge Planning.   Education Outcome: Acknowledges edcuation/In group clarification offered/Needs additional education  Clinical Observations/Feedback: Pt did not attend group.     Traci Peterson, LRT/CTRS         Traci Peterson 05/29/2017 1:08 PM 

## 2017-05-29 NOTE — [facility] Group Notes (Signed)
[facility] Group Therapy 05/29/2017 1:15pm  Type of Therapy: [facility] Presentation  Participation Level: Active  Participation Quality: Attentive  Affect: Appropriate  Cognitive: Oriented  Insight: Developing/Improving  Engagement in Therapy: Engaged  Modes of Intervention: Discussion, Education and Socialization  Summary of Progress/Problems: Mental Health Association (MHA) Speaker came to talk about his personal journey with mental health. The pt processed ways by which to relate to the speaker. MHA speaker provided handouts and educational information pertaining to groups and services offered by the MHA. Pt was engaged in speaker's presentation and was receptive to resources provided.    Tremeka Helbling Jon, LCSW 05/29/2017 4:12 PM 

## 2017-05-29 NOTE — Progress Notes (Signed)
D: Pt was in the dayroom upon initial approach.  Pt presents with appropriate affect and mood.  Describes her day as "great" and reports her goal was "to understand how the process would go today, acclimate."  She reports she accomplished her goal.  Pt reports she hopes to discharge tomorrow and she feels safe to do so.  She reports she has "good support outside and I don't have the financial means to stay here."  Pt denies SI/HI, denies hallucinations, reports abdominal pain of 8/10.  Pt has been visible in milieu interacting with peers and staff appropriately.  A: Introduced self to pt.  Actively listened to pt and offered support and encouragement. Medication administered per order.  PRN medication administered for pain.  Heat packs provided for pain.  Q15 minute safety checks maintained.  R: Pt is safe on the unit.  Pt is compliant with medications.  Pt verbally contracts for safety.  Will continue to monitor and assess.

## 2017-05-29 NOTE — Tx Team (Signed)
Initial Treatment Plan 05/29/2017 2:04 AM Traci FusiAntiqua Whidbee QMV:[id number]    PATIENT STRESSORS: Marital or family conflict Occupational concerns   PATIENT STRENGTHS: Average or above average intelligence Capable of independent living Licensed conveyancerCommunication skills Financial means Motivation for treatment/growth Religious Affiliation Supportive family/friends Work skills   PATIENT IDENTIFIED PROBLEMS: Depression "My husband is cheating on me"  "We want to work it out"    Anxiety  "I feel anxious all the time because of my problems with my husband and my work stress"                 DISCHARGE CRITERIA:  Ability to meet basic life and health needs Improved stabilization in mood, thinking, and/or behavior Medical problems require only outpatient monitoring Need for constant or close observation no longer present Verbal commitment to aftercare and medication compliance  PRELIMINARY DISCHARGE PLAN: Outpatient therapy Participate in family therapy Return to previous living arrangement  PATIENT/FAMILY INVOLVEMENT: This treatment plan has been presented to and reviewed with the patient, Traci Fusintiqua Peterson.  The patient has been given the opportunity to ask questions and make suggestions.  Sylvan CheeseSteven M Candence Sease, RN 05/29/2017, 2:04 AM

## 2017-05-29 NOTE — Progress Notes (Signed)
At pt request, CSW contacted CIT GroupLaqueta Red, business mgr at [facility] and informed her that [facility] and discharge projected for end of the week.  [phone] Garner NashGregory Jawanda Passey, MSW, LCSW Clinical Social Worker 05/29/2017 3:46 PM

## 2017-05-29 NOTE — Plan of Care (Signed)
  Progressing Safety: Periods of time without injury will increase 05/29/2017 2304 - Progressing by Traci Peterson, [name], RN Note Pt has not harmed self or others tonight.  She denies SI/HI and verbally contracts for safety.

## 2017-05-30 ENCOUNTER — Other Ambulatory Visit: Payer: Self-pay

## 2017-05-30 DIAGNOSIS — F1099 Alcohol use, unspecified with unspecified alcohol-induced disorder: Secondary | ICD-10-CM

## 2017-05-30 DIAGNOSIS — G4700 Insomnia, unspecified: Secondary | ICD-10-CM

## 2017-05-30 DIAGNOSIS — K5900 Constipation, unspecified: Secondary | ICD-10-CM | POA: Diagnosis present

## 2017-05-30 DIAGNOSIS — F39 Unspecified mood [affective] disorder: Secondary | ICD-10-CM

## 2017-05-30 DIAGNOSIS — F419 Anxiety disorder, unspecified: Secondary | ICD-10-CM

## 2017-05-30 MED ORDER — BISACODYL 10 MG RE SUPP
10.0000 mg | Freq: Once | RECTAL | Status: AC
  Administered 2017-05-30: 10 mg via RECTAL
  Filled 2017-05-30 (×2): qty 1

## 2017-05-30 NOTE — Progress Notes (Signed)
Patient ID: Traci Peterson, female   DOB: 02-Feb-1986, 32 y.o.   MRN: [id number]  Pt currently presents with an anxious affect and restless behavior. Pt reports to writer that "I really wanted to leave and was worried early, but I spoke to EverettRick and realized that there is a purpose for me being here." Pt states "I feel better about things now." Pt reports poor sleep due to 3rd shift job scheduled, requests sleep medications.    Pt provided with medications per providers orders. Pt's labs and vitals were monitored throughout the night. Pt given a 1:1 about emotional and mental status. Pt supported and encouraged to express concerns and questions. Pt educated on medications, supported emotionally.   Pt's safety ensured with 15 minute and environmental checks. Pt currently denies SI/HI and A/V hallucinations. Pt verbally agrees to seek staff if SI/HI or A/VH occurs and to consult with staff before acting on any harmful thoughts. Will continue POC.

## 2017-05-30 NOTE — Progress Notes (Signed)
DAR NOTE: Patient presents with bright affect and pleasant mood. Pt has been visible in the milieu interacting with peers. Pt complained of constipation lasting 3 days, dulcolax 10 mg suppository one time given. Pt also stated she wanted to be discharged so she can go and follow up on her FMLA papers. Denies auditory and visual hallucinations.  Rates depression at 4, hopelessness at 3, and anxiety at 4.  Maintained on routine safety checks.  Medications given as prescribed.  Support and encouragement offered as needed.  Attended group and participated.     Offered no complaint.

## 2017-05-30 NOTE — Progress Notes (Signed)
Adult Psychoeducational Group Note  Date:  05/30/2017 Time:  10:15 PM  Group Topic/Focus:  Wrap-Up Group:   The focus of this group is to help patients review their daily goal of treatment and discuss progress on daily workbooks.  Participation Level:  Active  Participation Quality:  Appropriate  Affect:  Appropriate  Cognitive:  Appropriate  Insight: Appropriate  Engagement in Group:  Distracting  Modes of Intervention:  Discussion  Additional Comments:  Patient is excited to be released tomorrow. Patient mentioned a change in medications. But, does not report any body changes, "because she slept all day."  Lyndee HensenGoins, [place] Gentle R 05/30/2017, 10:15 PM

## 2017-05-30 NOTE — Progress Notes (Signed)
[facility] MD Progress Note  05/30/2017 2:56 PM Traci Peterson  MRN:  [id number]   Subjective:  Patient reports that she is feeling better and is ready to go home. She states that she was told before she came here that if she came in voluntarily then she can leave when she wants. She became tearful, when she found that this was not true. She reports that her husband came to visit last night and he is supportive for her to get the treatment she needs. She reports that she was hoping for a faith based treatment program because she is a Company secretary in studying. She is also concerned about her job and her finances and it is increasing her anxiety. Patient also has complaint of constipation and she requests a suppository or enema. Patient denies any SI/HI/AVH and contracts for safety  Objective: Patient's chart and findings reviewed and discussed with treatment team. Patient presents pleasant and cooperative. Encouraged patient to use Vistaril for anxiety. Patient is encouraged to have 1:1 with Peer Support Specialist. Patient is anxious about staying, but agrees to stay voluntarily, but is still concerned about her job and finances.  Principal Problem: MDD (major depressive disorder), recurrent episode, severe ([place]) Diagnosis:   Patient Active Problem List   Diagnosis Date Noted  . Constipation [K59.00] 05/30/2017  . MDD (major depressive disorder), recurrent episode, severe ([place]) [F33.2] 05/29/2017  . Incompetent cervix [N88.3] 09/12/2016  . Gall stones [K80.20] 07/19/2016  . Family history of breast cancer in female [Z80.3] 06/06/2016  . Tinea pedis, recurrent [B35.3] 08/08/2015  . Pernicious anemia [D51.0] 08/08/2015  . Morbidly obese ([place]) [E66.01] 10/25/2014  . Eczema [L30.9] 10/25/2014  . 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 -- orange, yellow 6 and red food colorings - moderate to severe respiratory distress [Z91.02] 10/25/2014  . Chronic headaches -  no meds [R51] 10/25/2014  . Anxiety and depression [F41.9, F32.9]  11/30/2013  . HPV in female [B97.7] 05/08/2013  . PCOS (polycystic ovarian syndrome) [E28.2] 04/20/2013   Total Time spent with patient: 25 minutes  Past Psychiatric History: See H&P  Past Medical History:  Past Medical History:  Diagnosis Date  . Anxiety    no current med.  . Cervix prolapsed into vagina   . Chronic headaches   . Gall stones   . HPV in female   . Jaw snapping    states jaw pops if opens mouth too wide  . Ovarian cyst   . PCOS (polycystic ovarian syndrome)   . Pilonidal cyst 02/2013  . Preterm labor 2016   PPROM @ 18 wks, delivery of IUFD @ 21 wks  . Vaginal Pap smear, abnormal     Past Surgical History:  Procedure Laterality Date  . CHOLECYSTECTOMY N/A 07/21/2016   Procedure: LAPAROSCOPIC CHOLECYSTECTOMY;  Surgeon: Coralie Keens, MD;  Location: [place];  Service: General;  Laterality: N/A;  . CHOLECYSTECTOMY    . PILONIDAL CYST EXCISION N/A 03/12/2013   Procedure: CYST EXCISION PILONIDAL EXTENSIVE;  Surgeon: Harl Bowie, MD;  Location: [place];  Service: General;  Laterality: N/A;  . WISDOM TOOTH EXTRACTION     Family History:  Family History  Problem Relation Age of Onset  . Hypertension Mother   . Hyperlipidemia Mother   . Diabetes Mother   . Thyroid disease Mother   . Diabetes Father   . Hypertension Father   . Alcohol abuse Father   . Breast cancer Maternal Aunt   . Lung cancer Paternal Grandfather    Family  Psychiatric  History: See H&P Social History:  Social History   Substance and Sexual Activity  Alcohol Use Yes   Comment: socially     Social History   Substance and Sexual Activity  Drug Use No    Social History   Socioeconomic History  . Marital status: Married    Spouse name: None  . Number of children: None  . Years of education: None  . Highest education level: None  Social Needs  . Financial resource strain: None  . Food insecurity - worry: None  . Food insecurity - inability: None  .  Transportation needs - medical: None  . Transportation needs - non-medical: None  Occupational History  . Occupation: CMA    Employer: OTHER    Comment: CNA  Tobacco Use  . Smoking status: Current Every Day Smoker    Packs/day: 0.50    Types: Cigarettes  . Smokeless tobacco: Never Used  . Tobacco comment: 3-5 cig./day  Substance and Sexual Activity  . Alcohol use: Yes    Comment: socially  . Drug use: No  . Sexual activity: Yes  Other Topics Concern  . None  Social History Narrative   ** Merged History Encounter **       Additional Social History:    Pain Medications: See MAR Prescriptions: See MAR Over the Counter: See MAR History of alcohol / drug use?: Yes Longest period of sobriety (when/how long): social drinker Name of Substance 1: Alcohol  1 - Age of First Use: teens 1 - Amount (size/oz): varies 1 - Frequency: several times a week 1 - Duration: ongoing 1 - Last Use / Amount: unknown                  Sleep: Good  Appetite:  Good  Current Medications: Current Facility-Administered Medications  Medication Dose Route Frequency Provider Last Rate Last Dose  . acetaminophen (TYLENOL) tablet 650 mg  650 mg Oral Q6H PRN Laverle Hobby, PA-C   650 mg at 05/30/17 1101  . alum & mag hydroxide-simeth (MAALOX/MYLANTA) 200-200-20 MG/5ML suspension 30 mL  30 mL Oral Q4H PRN Patriciaann Clan E, PA-C      . bisacodyl (DULCOLAX) suppository 10 mg  10 mg Rectal Once Mayu Ronk, Lowry Ram, FNP      . FLUoxetine (PROZAC) capsule 20 mg  20 mg Oral Daily Izediuno, Vincent A, MD   20 mg at 05/30/17 0800  . hydrOXYzine (ATARAX/VISTARIL) tablet 25 mg  25 mg Oral Q6H PRN Laverle Hobby, PA-C   25 mg at 05/30/17 1204  . magnesium hydroxide (MILK OF MAGNESIA) suspension 30 mL  30 mL Oral Daily PRN Laverle Hobby, PA-C   30 mL at 05/30/17 0801  . nicotine (NICODERM CQ - dosed in mg/24 hours) patch 21 mg  21 mg Transdermal Daily Patriciaann Clan E, PA-C   21 mg at 05/30/17 0802  .  traZODone (DESYREL) tablet 50 mg  50 mg Oral QHS,MR X 1 Laverle Hobby, PA-C   50 mg at 05/29/17 2112    Lab Results:  Results for orders placed or performed during the hospital encounter of 05/28/17 (from the past 48 hour(s))  Comprehensive metabolic panel     Status: Abnormal   Collection Time: 05/28/17  7:02 PM  Result Value Ref Range   Sodium 140 135 - 145 mmol/L   Potassium 3.8 3.5 - 5.1 mmol/L   Chloride 108 101 - 111 mmol/L   CO2 27 22 - 32 mmol/L  Glucose, Bld 110 (H) 65 - 99 mg/dL   BUN 9 6 - 20 mg/dL   Creatinine, Ser 0.89 0.44 - 1.00 mg/dL   Calcium 9.5 8.9 - 10.3 mg/dL   Total Protein 7.9 6.5 - 8.1 g/dL   Albumin 4.0 3.5 - 5.0 g/dL   AST 35 15 - 41 U/L   ALT 57 (H) 14 - 54 U/L   Alkaline Phosphatase 71 38 - 126 U/L   Total Bilirubin 0.6 0.3 - 1.2 mg/dL   GFR calc non Af Amer >60 >60 mL/min   GFR calc Af Amer >60 >60 mL/min    Comment: (NOTE) The eGFR has been calculated using the CKD EPI equation. This calculation has not been validated in all clinical situations. eGFR's persistently <60 mL/min signify possible Chronic Kidney Disease.    Anion gap 5 5 - 15  Ethanol     Status: None   Collection Time: 05/28/17  7:02 PM  Result Value Ref Range   Alcohol, Ethyl (B) <10 <10 mg/dL    Comment:        LOWEST DETECTABLE LIMIT FOR SERUM ALCOHOL IS 10 mg/dL FOR MEDICAL PURPOSES ONLY   Salicylate level     Status: None   Collection Time: 05/28/17  7:02 PM  Result Value Ref Range   Salicylate Lvl <7.6 2.8 - 30.0 mg/dL  Acetaminophen level     Status: Abnormal   Collection Time: 05/28/17  7:02 PM  Result Value Ref Range   Acetaminophen (Tylenol), Serum <10 (L) 10 - 30 ug/mL    Comment:        THERAPEUTIC CONCENTRATIONS VARY SIGNIFICANTLY. A RANGE OF 10-30 ug/mL MAY BE AN EFFECTIVE CONCENTRATION FOR MANY PATIENTS. HOWEVER, SOME ARE BEST TREATED AT CONCENTRATIONS OUTSIDE THIS RANGE. ACETAMINOPHEN CONCENTRATIONS >150 ug/mL AT 4 HOURS AFTER INGESTION AND >50  ug/mL AT 12 HOURS AFTER INGESTION ARE OFTEN ASSOCIATED WITH TOXIC REACTIONS.   cbc     Status: None   Collection Time: 05/28/17  7:02 PM  Result Value Ref Range   WBC 8.5 4.0 - 10.5 K/uL   RBC 4.85 3.87 - 5.11 MIL/uL   Hemoglobin 14.5 12.0 - 15.0 g/dL   HCT 43.2 36.0 - 46.0 %   MCV 89.1 78.0 - 100.0 fL   MCH 29.9 26.0 - 34.0 pg   MCHC 33.6 30.0 - 36.0 g/dL   RDW 14.0 11.5 - 15.5 %   Platelets 290 150 - 400 K/uL  I-Stat beta hCG blood, ED     Status: None   Collection Time: 05/28/17  7:11 PM  Result Value Ref Range   I-stat hCG, quantitative <5.0 <5 mIU/mL   Comment 3            Comment:   GEST. AGE      CONC.  (mIU/mL)   <=1 WEEK        5 - 50     2 WEEKS       50 - 500     3 WEEKS       100 - 10,000     4 WEEKS     1,000 - 30,000        FEMALE AND NON-PREGNANT FEMALE:     LESS THAN 5 mIU/mL   Rapid urine drug screen (hospital performed)     Status: None   Collection Time: 05/28/17  7:18 PM  Result Value Ref Range   Opiates NONE DETECTED NONE DETECTED   Cocaine NONE DETECTED NONE DETECTED  Benzodiazepines NONE DETECTED NONE DETECTED   Amphetamines NONE DETECTED NONE DETECTED   Tetrahydrocannabinol NONE DETECTED NONE DETECTED   Barbiturates NONE DETECTED NONE DETECTED    Comment: (NOTE) DRUG SCREEN FOR MEDICAL PURPOSES ONLY.  IF CONFIRMATION IS NEEDED FOR ANY PURPOSE, NOTIFY LAB WITHIN 5 DAYS. LOWEST DETECTABLE LIMITS FOR URINE DRUG SCREEN Drug Class                     Cutoff (ng/mL) Amphetamine and metabolites    1000 Barbiturate and metabolites    200 Benzodiazepine                 388 Tricyclics and metabolites     300 Opiates and metabolites        300 Cocaine and metabolites        300 THC                            50     Blood Alcohol level:  Lab Results  Component Value Date   ETH <10 82/80/0349    Metabolic Disorder Labs: Lab Results  Component Value Date   HGBA1C 5.8 (H) 06/06/2016   MPG 120 06/06/2016   Lab Results  Component Value Date    PROLACTIN 12.6 01/07/2013   Lab Results  Component Value Date   CHOL 157 08/08/2015   TRIG 161.0 (H) 08/08/2015   HDL 46.50 08/08/2015   CHOLHDL 3 08/08/2015   VLDL 32.2 08/08/2015   LDLCALC 78 08/08/2015   LDLCALC 98 12/31/2011    Physical Findings: AIMS: Facial and Oral Movements Muscles of Facial Expression: None, normal Lips and Perioral Area: None, normal Jaw: None, normal Tongue: None, normal,Extremity Movements Upper (arms, wrists, hands, fingers): None, normal Lower (legs, knees, ankles, toes): None, normal, Trunk Movements Neck, shoulders, hips: None, normal, Overall Severity Severity of abnormal movements (highest score from questions above): None, normal Incapacitation due to abnormal movements: None, normal Patient's awareness of abnormal movements (rate only patient's report): No Awareness, Dental Status Current problems with teeth and/or dentures?: No Does patient usually wear dentures?: No  CIWA:    COWS:     Musculoskeletal: Strength & Muscle Tone: within normal limits Gait & Station: normal Patient leans: N/A  Psychiatric Specialty Exam: Physical Exam  ROS  Blood pressure 108/69, pulse 100, temperature 98.2 F (36.8 C), temperature source Oral, resp. rate 18, height 5' 2"  (1.575 m), weight 112.5 kg (248 lb), last menstrual period 05/28/2017, SpO2 99 %, not currently breastfeeding.Body mass index is 45.36 kg/m.  General Appearance: Casual  Eye Contact:  Good  Speech:  Clear and Coherent and Normal Rate  Volume:  Normal  Mood:  Anxious  Affect:  Congruent  Thought Process:  Goal Directed and Descriptions of Associations: Intact  Orientation:  Full (Time, Place, and Person)  Thought Content:  WDL  Suicidal Thoughts:  No  Homicidal Thoughts:  No  Memory:  Immediate;   Good Recent;   Good Remote;   Good  Judgement:  Good  Insight:  Good  Psychomotor Activity:  Normal  Concentration:  Concentration: Good and Attention Span: Good  Recall:  Good   Fund of Knowledge:  Good  Language:  Good  Akathisia:  No  Handed:  Right  AIMS (if indicated):     Assets:  Communication Skills Desire for Improvement Financial Resources/Insurance Housing Physical Health Social Support Transportation  ADL's:  Intact  Cognition:  WNL  Sleep:  Number of Hours: 6.5   Problems Addressed: MDD severe Constipation  Treatment Plan Summary: Daily contact with patient to assess and evaluate symptoms and progress in treatment, Medication management and Plan is to:  -Continue Prozac 20 mg PO Daily for mood stability -Continue Trazodone 50 mg PO QHS PRN for insomnia -Continue Vistaril 25 mg PO Q6H PRN for anxiety -Start Dulcolax suppository 10 mg Once  -Encourage group therapy participation -Encouraged 1:1 with peer support specialist   Lewis Shock, [place] 05/30/2017, 2:56 PM

## 2017-05-31 MED ORDER — HYDROXYZINE HCL 25 MG PO TABS: 25.0000 mg | ORAL_TABLET | Freq: Four times a day (QID) | ORAL | 0 refills | Status: DC | PRN

## 2017-05-31 MED ORDER — FLUOXETINE HCL 20 MG PO CAPS: 20.0000 mg | ORAL_CAPSULE | Freq: Every day | ORAL | 0 refills | Status: DC

## 2017-05-31 MED ORDER — TRAZODONE HCL 50 MG PO TABS: 50.0000 mg | ORAL_TABLET | Freq: Every evening | ORAL | 0 refills | Status: DC | PRN

## 2017-05-31 NOTE — Progress Notes (Signed)
  [facility] :  Will you be returning to the same living situation after discharge:  Yes,  own home At discharge, do you have transportation home?: Yes,  own vehicle is here. Do you have the ability to pay for your medications: Yes,  [employer]  Release of information consent forms completed and in the chart;  Patient's signature needed at discharge.  Patient to Follow up at: Follow-up Information    [facility]2017.   Why:  Please attend your follow up therapy appointment with Earl LagosGlen Zelenak on 06/06/17 at 2:00pm. Please bring a copy of your hospital discharge paperwork. Please ask about medication management with Donnie Ahoobin Bridges at this appointment.  Contact information: [address] [place] [address] [phone]           Next level of care provider has access to [facility] Link:no  Safety Planning and Suicide Prevention discussed: No. NA  Have you used any form of tobacco in the last 30 days? (Cigarettes, Smokeless Tobacco, Cigars, and/or Pipes): Yes  Has patient been referred to the Quitline?: Yes, faxed on 05/31/17  Patient has been referred for addiction treatment: Yes  Lorri FrederickWierda, Angelyna Henderson Jon, LCSW 05/31/2017, 11:28 AM

## 2017-05-31 NOTE — [facility] Suicide Risk Assessment (Signed)
[facility]   Principal Problem: MDD (major depressive disorder), recurrent episode, severe ([facility]) Discharge Diagnoses:  Patient Active Problem List   Diagnosis Date Noted  . Constipation [K59.00] 05/30/2017  . MDD (major depressive disorder), recurrent episode, severe ([facility]) [F33.2] 05/29/2017  . Incompetent cervix [N88.3] 09/12/2016  . Gall stones [K80.20] 07/19/2016  . Family history of breast cancer in female [Z80.3] 06/06/2016  . Tinea pedis, recurrent [B35.3] 08/08/2015  . Pernicious anemia [D51.0] 08/08/2015  . Morbidly obese ([facility]) [E66.01] 10/25/2014  . Eczema [L30.9] 10/25/2014  . 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 -- orange, yellow 6 and red food colorings - moderate to severe respiratory distress [Z91.02] 10/25/2014  . Chronic headaches -  no meds [R51] 10/25/2014  . Anxiety and depression [F41.9, F32.9] 11/30/2013  . HPV in female [B97.7] 05/08/2013  . PCOS (polycystic ovarian syndrome) [E28.2] 04/20/2013    Total Time spent with patient: 45 minutes  Musculoskeletal: Strength & Muscle Tone: within normal limits Gait & Station: normal Patient leans: N/A  Psychiatric Specialty Exam: Review of Systems  Constitutional: Negative.   HENT: Negative.   Eyes: Negative.   Respiratory: Negative.   Cardiovascular: Negative.   Gastrointestinal: Negative.   Genitourinary: Negative.   Musculoskeletal: Negative.   Skin: Negative.   Neurological: Negative.   Endo/Heme/Allergies: Negative.   Psychiatric/Behavioral: Negative for depression, hallucinations, memory loss, substance abuse and suicidal ideas. The patient is not nervous/anxious and does not have insomnia.     Blood pressure 98/62, pulse 86, temperature 98.3 F (36.8 C), temperature source Oral, resp. rate 18, height 5\' 2"  (1.575 m), weight 112.5 kg (248 lb), last menstrual period 05/28/2017, SpO2 99 %, not currently breastfeeding.Body mass index is 45.36 kg/m.  General Appearance: Neatly dressed, pleasant,  engaging well and cooperative. Appropriate behavior. Not in any distress. Good relatedness. Not internally stimulated  Eye Contact::  Good  Speech:  Spontaneous, normal prosody. Normal tone and rate.   Volume:  Normal  Mood:  Euthymic  Affect:  Appropriate and Full Range  Thought Process:  Linear  Orientation:  Full (Time, Place, and Person)  Thought Content:  Future oriented. No delusional theme. No preoccupation with violent thoughts. No negative ruminations. No obsession.  No hallucination in any modality.   Suicidal Thoughts:  No  Homicidal Thoughts:  No  Memory:  Immediate;   Good Recent;   Good Remote;   Good  Judgement:  Good  Insight:  Good  Psychomotor Activity:  Normal  Concentration:  Good  Recall:  Good  Fund of Knowledge:Good  Language: Good  Akathisia:  Negative  Handed:    AIMS (if indicated):     Assets:  Communication Skills Desire for Improvement Financial Resources/Insurance Housing Intimacy Physical Health Resilience Social Support Talents/Skills Transportation Vocational/Educational  Sleep:  Number of Hours: 6.75  Cognition: WNL  ADL's:  Intact   Clinical Assessment::   [name], married, lives with her husband, employed as a LawyerCNA. Background history of Unipolar depression. Self presented to the ER unaccompanied. Reports worsening depression associated with suicidal thoughts. Patient has not been coping well. She has used alcohol and pills to cope. She reports she just found out that her husband has sex addiction. She just found out another woman is about to have his baby. She has been contacted by many women who have been sleeping with her husband. Patient has been ruminating on recent losses. She lost two pregnancies recently. Her aunt passed recently.  Routine labs is significant for strained liver  enzymes, toxicology is negative,  UDS is negative , BAL ,10 mg/dl.  Seen today. Patient says she has been able to process her family situation while she  was here. She is committed to work things out and keep her marriage intact. Says her husband has been visiting while she was here. Patient is feeling better. She is in good spirits. She is not feeling depressed. Says she has been able to enjoy being around people and do routine things. Her sleep wake cycle has been erratic as she works third shift. She is able to think clearly. She is able to focus on task. Her thoughts are not crowded or racing. No evidence of mania. No hallucination in any modality. He is not making any delusional statement. No passivity of will/thought. He is fully in touch with reality. No thoughts of suicide. No thoughts of homicide. No violent thoughts. No overwhelming anxiety. No access to weapons.   Nursing staff reports that patient has been appropriate on the unit. Patient has been interacting well with peers. No behavioral issues. Patient has not voiced any suicidal thoughts. Patient has not been observed to be internally stimulated. Patient has been adherent with treatment recommendations. Patient has been tolerating their medication well.   Patient was discussed at team. Team members feels that patient is back to her baseline level of function. Team agrees with plan to discharge patient today.   Demographic Factors:  NA  Loss Factors: NA  Historical Factors: NA  Risk Reduction Factors:   Responsible for children under 33 years of age, Sense of responsibility to family, Employed, Living with another person, especially a relative, Positive social support, Positive therapeutic relationship and Positive coping skills or problem solving skills  Continued Clinical Symptoms:  As above   Cognitive Features That Contribute To Risk:  None    Suicide Risk:  Minimal: No identifiable suicidal ideation.  Patient is not having any thoughts of suicide at this time. Modifiable risk factors targeted during this admission includes depression and adjustment disorder. Demographical  and historical risk factors cannot be modified. Patient is now engaging well. Patient is reliable and is future oriented. We have buffered patient's support structures. At this point, patient is at low risk of suicide. Patient is aware of the effects of psychoactive substances on decision making process. Patient has been provided with emergency contacts. Patient acknowledges to use resources provided if unforseen circumstances changes their current risk stratification.    Follow-up Information    [facility] Go on 06/06/2017.   Why:  Please attend your follow up therapy appointment with Earl Lagos on 06/06/17 at 2:00pm. Please bring a copy of your hospital discharge paperwork. Please ask about medication management with Donnie Aho at this appointment.  Contact information: [address] [place] [address] [phone]           Plan Of Care/Follow-up recommendations:  1. Continue current psychotropic medications 2. Mental health and addiction follow up as arranged.  3. Discharge in care of her family 4. Provided limited quantity of prescriptions   Georgiann Cocker, MD 05/31/2017, 10:00 AM
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
## 2017-05-31 NOTE — Progress Notes (Signed)
Recreation Therapy Notes  Date: 05/31/17 Time: 0930 Location: 300 [address] Dayroom  Group Topic: Stress Management  Goal Area(s) Addresses:  Patient will verbalize importance of using healthy stress management.  Patient will identify positive emotions associated with healthy stress management.   Behavioral Response: Engaged  Intervention: Stress Management  Activity :  Meditation.  LRT introduced the stress management technique of meditation to patients.  Patients were to listen as the meditation guided them on forgiveness of self.  Education:  Stress Management, Discharge Planning.   Education Outcome: Acknowledges edcuation/In group clarification offered/Needs additional education  Clinical Observations/Feedback: Pt attended group.    Annielee Jemmott, LRT/CTRS         Dillan Candela A 05/31/2017 12:07 PM 

## 2017-05-31 NOTE — Progress Notes (Signed)
Pt discharged to lobby waiting for the security to drop her over [facility] to pick up her car.. Pt was ambulatory, stable and appreciative at that time. All papers and prescriptions were given and valuables returned. Verbal understanding expressed. Denies SI/HI and A/VH. Pt given opportunity to express concerns and ask questions.

## 2017-06-03 ENCOUNTER — Ambulatory Visit: Payer: Self-pay | Admitting: Internal Medicine

## 2017-06-04 ENCOUNTER — Other Ambulatory Visit (HOSPITAL_COMMUNITY)
Admission: RE | Admit: 2017-06-04 | Discharge: 2017-06-04 | Disposition: A | Discharge status: Home / Self Care | Payer: 59 | Source: Ambulatory Visit | Attending: Obstetrics & Gynecology | Admitting: Obstetrics & Gynecology

## 2017-06-04 ENCOUNTER — Other Ambulatory Visit (INDEPENDENT_AMBULATORY_CARE_PROVIDER_SITE_OTHER): Payer: 59

## 2017-06-04 DIAGNOSIS — Z202 Contact with and (suspected) exposure to infections with a predominantly sexual mode of transmission: Secondary | ICD-10-CM | POA: Diagnosis not present

## 2017-06-05 LAB — HEPATITIS C ANTIBODY
Hepatitis C Ab: NONREACTIVE
SIGNAL TO CUT-OFF: 0.03 (ref <1.00)

## 2017-06-05 LAB — HEPATITIS B SURFACE ANTIGEN: Hepatitis B Surface Ag: NONREACTIVE

## 2017-06-05 LAB — RPR: RPR Ser Ql: NONREACTIVE

## 2017-06-05 LAB — CERVICOVAGINAL ANCILLARY ONLY
Chlamydia: NEGATIVE
NEISSERIA GONORRHEA: NEGATIVE
TRICH (WINDOWPATH): NEGATIVE

## 2017-06-05 LAB — HIV ANTIBODY (ROUTINE TESTING W REFLEX): HIV 1&2 Ab, 4th Generation: NONREACTIVE

## 2017-06-06 ENCOUNTER — Telehealth: Payer: Self-pay | Admitting: *Deleted

## 2017-06-06 NOTE — Telephone Encounter (Signed)
Pt notified of normal STD labwork.

## 2017-06-11 ENCOUNTER — Ambulatory Visit: Payer: Self-pay | Admitting: Internal Medicine

## 2017-06-17 ENCOUNTER — Encounter: Payer: Self-pay | Admitting: Internal Medicine

## 2017-06-17 ENCOUNTER — Ambulatory Visit (INDEPENDENT_AMBULATORY_CARE_PROVIDER_SITE_OTHER): Payer: 59 | Admitting: Internal Medicine

## 2017-06-17 VITALS — BP 120/80 | HR 80 | Temp 98.0°F | Resp 16 | Ht 62.0 in | Wt 252.0 lb

## 2017-06-17 DIAGNOSIS — B353 Tinea pedis: Secondary | ICD-10-CM | POA: Diagnosis not present

## 2017-06-17 DIAGNOSIS — J0100 Acute maxillary sinusitis, unspecified: Secondary | ICD-10-CM | POA: Insufficient documentation

## 2017-06-17 MED ORDER — TERBINAFINE HCL 1 % EX SOLN: 1.0000 "application " | Freq: Two times a day (BID) | CUTANEOUS | 1 refills | Status: DC

## 2017-06-17 MED ORDER — AMOXICILLIN 875 MG PO TABS: 875.0000 mg | ORAL_TABLET | Freq: Two times a day (BID) | ORAL | 0 refills | Status: AC

## 2017-06-17 MED ORDER — CHLORPHEN-PE-ACETAMINOPHEN 4-10-325 MG PO TABS: 1.0000 | ORAL_TABLET | Freq: Four times a day (QID) | ORAL | 0 refills | Status: AC | PRN

## 2017-06-17 NOTE — Progress Notes (Signed)
Subjective:  Patient ID: Traci Peterson, female    DOB: 07/05/1985  Age: 32 y.o. MRN: [id number]  CC: Sinusitis and Rash   HPI Daliah Chaudoin presents for a 2-day history of runny nose productive of thick yellow-green phlegm with facial pain, congestion, sneezing, sore throat, low-grade fever, chills, and muscle aches.  She also complains of a several week history of intensely itchy rash in her feet between her toes.  She has not treated this.  Outpatient Medications Prior to Visit  Medication Sig Dispense Refill  . etonogestrel-ethinyl estradiol (NUVARING) 0.12-0.015 MG/24HR vaginal ring Insert vaginally and leave in place for 3 consecutive weeks, then remove for 1 week. 1 each 5  . FLUoxetine (PROZAC) 20 MG capsule Take 1 capsule (20 mg total) by mouth daily. For mood control 30 capsule 0  . hydrOXYzine (ATARAX/VISTARIL) 25 MG tablet Take 1 tablet (25 mg total) by mouth every 6 (six) hours as needed for anxiety. 30 tablet 0  . traZODone (DESYREL) 50 MG tablet Take 1 tablet (50 mg total) by mouth at bedtime as needed for sleep. 30 tablet 0   No facility-administered medications prior to visit.     ROS Review of Systems  Constitutional: Positive for chills and fever. Negative for fatigue and unexpected weight change.  HENT: Positive for congestion, postnasal drip, rhinorrhea, sinus pressure, sinus pain and sore throat. Negative for facial swelling, trouble swallowing and voice change.   Respiratory: Negative for cough, chest tightness and shortness of breath.   Cardiovascular: Negative for chest pain, palpitations and leg swelling.  Gastrointestinal: Negative for abdominal pain, diarrhea and nausea.  Endocrine: Negative.   Genitourinary: Negative.  Negative for difficulty urinating.  Musculoskeletal: Negative.  Negative for arthralgias.  Skin: Positive for rash.  Allergic/Immunologic: Negative.   Neurological: Negative.  Negative for dizziness.  Hematological: Does not  bruise/bleed easily.  Psychiatric/Behavioral: Negative.     Objective:  BP 120/80 (BP Location: Left Arm, Patient Position: Sitting, Cuff Size: Large)   Pulse 80   Temp 98 F (36.7 C) (Oral)   Resp 16   Ht 5\' 2"  (1.575 m)   Wt 252 lb (114.3 kg)   LMP 05/28/2017 (Exact Date)   SpO2 98%   BMI 46.09 kg/m   BP Readings from Last 3 Encounters:  06/17/17 120/80  05/28/17 120/84  04/17/17 105/62    Wt Readings from Last 3 Encounters:  06/17/17 252 lb (114.3 kg)  04/16/17 246 lb (111.6 kg)  01/29/17 248 lb (112.5 kg)    Physical Exam  Constitutional: She is oriented to person, place, and time. No distress.  HENT:  Right Ear: Hearing, tympanic membrane, external ear and ear canal normal.  Left Ear: Hearing, tympanic membrane, external ear and ear canal normal.  Nose: Mucosal edema and rhinorrhea present. No sinus tenderness. No epistaxis. Right sinus exhibits no maxillary sinus tenderness. Left sinus exhibits no maxillary sinus tenderness.  Mouth/Throat: Mucous membranes are normal. Mucous membranes are not pale, not dry and not cyanotic. No oral lesions. No trismus in the jaw. No uvula swelling. Posterior oropharyngeal erythema present. No oropharyngeal exudate, posterior oropharyngeal edema or tonsillar abscesses.  Eyes: Conjunctivae are normal. Left eye exhibits no discharge. No scleral icterus.  Neck: Normal range of motion. Neck supple. No JVD present. No thyromegaly present.  Cardiovascular: Normal rate, regular rhythm, normal heart sounds and intact distal pulses. Exam reveals no gallop.  No murmur heard. Pulmonary/Chest: Effort normal and breath sounds normal. No respiratory distress. She has no wheezes. She  has no rales.  Abdominal: Soft. Bowel sounds are normal. She exhibits no distension and no mass.  Musculoskeletal: Normal range of motion. She exhibits no edema, tenderness or deformity.  Lymphadenopathy:    She has no cervical adenopathy.  Neurological: She is alert  and oriented to person, place, and time.  Skin: Rash noted. She is not diaphoretic.  In both feet in the web spaces between the toes there is maceration, coalesced papules, scale, and hyperpigmentation.  Vitals reviewed.   Lab Results  Component Value Date   WBC 8.5 05/28/2017   HGB 14.5 05/28/2017   HCT 43.2 05/28/2017   PLT 290 05/28/2017   GLUCOSE 110 (H) 05/28/2017   CHOL 157 08/08/2015   TRIG 161.0 (H) 08/08/2015   HDL 46.50 08/08/2015   LDLCALC 78 08/08/2015   ALT 57 (H) 05/28/2017   AST 35 05/28/2017   NA 140 05/28/2017   K 3.8 05/28/2017   CL 108 05/28/2017   CREATININE 0.89 05/28/2017   BUN 9 05/28/2017   CO2 27 05/28/2017   TSH 2.04 06/06/2016   HGBA1C 5.8 (H) 06/06/2016    No results found.  Assessment & Plan:   Isla Pencentiqua was seen today for sinusitis and rash.  Diagnoses and all orders for this visit:  Tinea pedis of both feet -     Terbinafine HCl 1 % SOLN; Apply 1 application topically 2 (two) times daily.  Acute maxillary sinusitis, recurrence not specified -     amoxicillin (AMOXIL) 875 MG tablet; Take 1 tablet (875 mg total) by mouth 2 (two) times daily for 10 days. -     Chlorphen-PE-Acetaminophen (NOREL AD) 4-10-325 MG TABS; Take 1 tablet by mouth every 6 (six) hours as needed for up to 7 days.   I am having Jozy Alleyne "[name]" start on Terbinafine HCl, amoxicillin, and Chlorphen-PE-Acetaminophen. I am also having her maintain her etonogestrel-ethinyl estradiol, FLUoxetine, hydrOXYzine, and traZODone.  Meds ordered this encounter  Medications  . Terbinafine HCl 1 % SOLN    Sig: Apply 1 application topically 2 (two) times daily.    Dispense:  250 mL    Refill:  1  . amoxicillin (AMOXIL) 875 MG tablet    Sig: Take 1 tablet (875 mg total) by mouth 2 (two) times daily for 10 days.    Dispense:  20 tablet    Refill:  0  . Chlorphen-PE-Acetaminophen (NOREL AD) 4-10-325 MG TABS    Sig: Take 1 tablet by mouth every 6 (six) hours as needed for  up to 7 days.    Dispense:  84 tablet    Refill:  0     Follow-up: Return in about 3 weeks (around 07/08/2017).  Sanda Lingerhomas Jevante Hollibaugh, MD

## 2017-06-17 NOTE — Patient Instructions (Signed)
Athlete's Foot Athlete's foot (tinea pedis) is a fungal infection of the skin on the feet. It often occurs on the skin that is between or underneath the toes. It can also occur on the soles of the feet. The infection can spread from person to person (is contagious). Follow these instructions at home:  Apply or take over-the-counter and prescription medicines only as told by your doctor.  Keep all follow-up visits as told by your doctor. This is important.  Do not scratch your feet.  Keep your feet dry: ? Wear cotton or wool socks. Change your socks every day or if they become wet. ? Wear shoes that allow air to move around, such as sandals or canvas tennis shoes.  Wash and dry your feet: ? Every day or as told by your doctor. ? After exercising. ? Including the area between your toes.  Wear sandals in wet areas, such as locker rooms and shared showers.  Do not share any of these items: ? Towels. ? Nail clippers. ? Other personal items that touch your feet.  If you have diabetes, keep your blood sugar under control. Contact a doctor if:  You have a fever.  You have swelling, soreness, warmth, or redness in your foot.  You are not getting better with treatment.  Your symptoms get worse.  You have new symptoms. This information is not intended to replace advice given to you by your health care provider. Make sure you discuss any questions you have with your health care provider. Document Released: 10/31/2007 Document Revised: 10/20/2015 Document Reviewed: 11/15/2014 Elsevier Interactive Patient Education  2018 [employer]  

## 2017-06-27 ENCOUNTER — Ambulatory Visit: Payer: Self-pay | Admitting: Obstetrics & Gynecology

## 2017-06-27 ENCOUNTER — Encounter: Payer: Self-pay | Admitting: Obstetrics & Gynecology

## 2017-06-30 NOTE — Progress Notes (Signed)
This encounter was created in error - please disregard.

## 2017-08-04 ENCOUNTER — Encounter (HOSPITAL_COMMUNITY): Payer: Self-pay

## 2017-08-04 ENCOUNTER — Inpatient Hospital Stay (HOSPITAL_COMMUNITY)
Admission: AD | Admit: 2017-08-04 | Discharge: 2017-08-04 | Disposition: A | Discharge status: Home / Self Care | Payer: 59 | Source: Ambulatory Visit | Attending: Obstetrics and Gynecology | Admitting: Obstetrics and Gynecology

## 2017-08-04 DIAGNOSIS — R109 Unspecified abdominal pain: Secondary | ICD-10-CM | POA: Diagnosis present

## 2017-08-04 DIAGNOSIS — Z833 Family history of diabetes mellitus: Secondary | ICD-10-CM | POA: Insufficient documentation

## 2017-08-04 DIAGNOSIS — N939 Abnormal uterine and vaginal bleeding, unspecified: Secondary | ICD-10-CM | POA: Diagnosis present

## 2017-08-04 DIAGNOSIS — F17210 Nicotine dependence, cigarettes, uncomplicated: Secondary | ICD-10-CM | POA: Insufficient documentation

## 2017-08-04 DIAGNOSIS — Z9049 Acquired absence of other specified parts of digestive tract: Secondary | ICD-10-CM | POA: Insufficient documentation

## 2017-08-04 DIAGNOSIS — N760 Acute vaginitis: Secondary | ICD-10-CM | POA: Diagnosis not present

## 2017-08-04 DIAGNOSIS — B9689 Other specified bacterial agents as the cause of diseases classified elsewhere: Secondary | ICD-10-CM | POA: Diagnosis not present

## 2017-08-04 DIAGNOSIS — R110 Nausea: Secondary | ICD-10-CM | POA: Diagnosis not present

## 2017-08-04 DIAGNOSIS — Z8249 Family history of ischemic heart disease and other diseases of the circulatory system: Secondary | ICD-10-CM | POA: Diagnosis not present

## 2017-08-04 DIAGNOSIS — E282 Polycystic ovarian syndrome: Secondary | ICD-10-CM | POA: Insufficient documentation

## 2017-08-04 DIAGNOSIS — Z8349 Family history of other endocrine, nutritional and metabolic diseases: Secondary | ICD-10-CM | POA: Insufficient documentation

## 2017-08-04 LAB — URINALYSIS, ROUTINE W REFLEX MICROSCOPIC
Bilirubin Urine: NEGATIVE
Glucose, UA: NEGATIVE mg/dL
Ketones, ur: NEGATIVE mg/dL
Leukocytes, UA: NEGATIVE
Nitrite: NEGATIVE
Protein, ur: 30 mg/dL — AB
SPECIFIC GRAVITY, URINE: 1.03 (ref 1.005–1.030)
pH: 5 (ref 5.0–8.0)

## 2017-08-04 LAB — WET PREP, GENITAL
SPERM: NONE SEEN
Trich, Wet Prep: NONE SEEN
Yeast Wet Prep HPF POC: NONE SEEN

## 2017-08-04 LAB — CBC WITH DIFFERENTIAL/PLATELET
Basophils Absolute: 0 10*3/uL (ref 0.0–0.1)
Basophils Relative: 0 %
Eosinophils Absolute: 0.2 10*3/uL (ref 0.0–0.7)
Eosinophils Relative: 4 %
HCT: 39.4 % (ref 36.0–46.0)
HEMOGLOBIN: 13.1 g/dL (ref 12.0–15.0)
LYMPHS ABS: 2.1 10*3/uL (ref 0.7–4.0)
LYMPHS PCT: 36 %
MCH: 29.6 pg (ref 26.0–34.0)
MCHC: 33.2 g/dL (ref 30.0–36.0)
MCV: 89.1 fL (ref 78.0–100.0)
Monocytes Absolute: 0.2 10*3/uL (ref 0.1–1.0)
Monocytes Relative: 3 %
NEUTROS PCT: 57 %
Neutro Abs: 3.2 10*3/uL (ref 1.7–7.7)
Platelets: 230 10*3/uL (ref 150–400)
RBC: 4.42 MIL/uL (ref 3.87–5.11)
RDW: 14.7 % (ref 11.5–15.5)
WBC: 5.7 10*3/uL (ref 4.0–10.5)

## 2017-08-04 LAB — POCT PREGNANCY, URINE: Preg Test, Ur: NEGATIVE

## 2017-08-04 MED ORDER — IBUPROFEN 600 MG PO TABS: 600.0000 mg | ORAL_TABLET | Freq: Four times a day (QID) | ORAL | 0 refills | Status: DC | PRN

## 2017-08-04 MED ORDER — METRONIDAZOLE 500 MG PO TABS: 500.0000 mg | ORAL_TABLET | Freq: Two times a day (BID) | ORAL | 0 refills | Status: AC

## 2017-08-04 MED ORDER — KETOROLAC TROMETHAMINE 60 MG/2ML IM SOLN
60.0000 mg | INTRAMUSCULAR | Status: AC
  Administered 2017-08-04: 60 mg via INTRAMUSCULAR
  Filled 2017-08-04: qty 2

## 2017-08-04 NOTE — MAU Provider Note (Signed)
History     CSN: [id number]  Arrival date and time: 08/04/17 7829   First Provider Initiated Contact with Patient 08/04/17 [phone]      Chief Complaint  Patient presents with  . Abdominal Pain  . Back Pain  . Vaginal Bleeding   HPI  Ms.  Traci Peterson is a 32 y.o. year old [id number] non-pregnant female who presents to MAU reporting heavy VB today. She reports that she began spotting yesterday with abdominal pain. She and her husband are TTC, so she is concerned that she might be pregnant. She also complains of some nausea. She has a h/o PCOS and 2nd trimester losses x 2.  Past Medical History:  Diagnosis Date  . Anxiety    no current med.  . Cervix prolapsed into vagina   . Chronic headaches   . Gall stones   . HPV in female   . Jaw snapping    states jaw pops if opens mouth too wide  . Ovarian cyst   . PCOS (polycystic ovarian syndrome)   . Pilonidal cyst 02/2013  . Preterm labor 2016   PPROM @ 18 wks, delivery of IUFD @ 21 wks  . Vaginal Pap smear, abnormal     Past Surgical History:  Procedure Laterality Date  . CHOLECYSTECTOMY N/A 07/21/2016   Procedure: LAPAROSCOPIC CHOLECYSTECTOMY;  Surgeon: Abigail Miyamoto, MD;  Location: [facility] OR;  Service: General;  Laterality: N/A;  . CHOLECYSTECTOMY    . PILONIDAL CYST EXCISION N/A 03/12/2013   Procedure: CYST EXCISION PILONIDAL EXTENSIVE;  Surgeon: Shelly Rubenstein, MD;  Location: [facility];  Service: General;  Laterality: N/A;  . WISDOM TOOTH EXTRACTION      Family History  Problem Relation Age of Onset  . Hypertension Mother   . Hyperlipidemia Mother   . Diabetes Mother   . Thyroid disease Mother   . Diabetes Father   . Hypertension Father   . Alcohol abuse Father   . Breast cancer Maternal Aunt   . Lung cancer Paternal Grandfather     Social History   Tobacco Use  . Smoking status: Current Every Day Smoker    Packs/day: 0.25    Types: Cigarettes  . Smokeless tobacco: Never Used  .  Tobacco comment: 3-5 cig./day  Substance Use Topics  . Alcohol use: No    Frequency: Never    Comment: socially  . Drug use: No    Allergies:  Allergies  Allergen Reactions  . Lactose Intolerance (Gi) Diarrhea    Medications Prior to Admission  Medication Sig Dispense Refill Last Dose  . Multiple Vitamin (MULTIVITAMIN) LIQD Take 30 mLs by mouth daily.   Past Week at Unknown time  . FLUoxetine (PROZAC) 20 MG capsule Take 1 capsule (20 mg total) by mouth daily. For mood control (Patient not taking: Reported on 08/04/2017) 30 capsule 0 Not Taking at Unknown time  . hydrOXYzine (ATARAX/VISTARIL) 25 MG tablet Take 1 tablet (25 mg total) by mouth every 6 (six) hours as needed for anxiety. (Patient not taking: Reported on 08/04/2017) 30 tablet 0 Not Taking at Unknown time  . Terbinafine HCl 1 % SOLN Apply 1 application topically 2 (two) times daily. (Patient not taking: Reported on 08/04/2017) 250 mL 1 Not Taking at Unknown time  . traZODone (DESYREL) 50 MG tablet Take 1 tablet (50 mg total) by mouth at bedtime as needed for sleep. (Patient not taking: Reported on 08/04/2017) 30 tablet 0 Not Taking at Unknown time    Review  of Systems  Constitutional: Negative.   HENT: Negative.   Eyes: Negative.   Respiratory: Negative.   Cardiovascular: Negative.   Gastrointestinal: Positive for nausea.  Endocrine: Negative.   Genitourinary: Positive for menstrual problem, pelvic pain and vaginal bleeding.  Musculoskeletal: Positive for back pain.  Skin: Negative.   Allergic/Immunologic: Negative.   Neurological: Negative.   Hematological: Negative.   Psychiatric/Behavioral: Negative.    Physical Exam   Blood pressure 128/68, pulse 81, temperature 97.9 F (36.6 C), temperature source Oral, resp. rate 18, height 5\' 2"  (1.575 m), weight 253 lb 12 oz (115.1 kg), last menstrual period 07/04/2017.  Physical Exam  Nursing note and vitals reviewed. Constitutional: She is oriented to person, place, and  time. She appears well-developed and well-nourished.  HENT:  Head: Normocephalic and atraumatic.  Eyes: Pupils are equal, round, and reactive to light.  Neck: Normal range of motion.  Cardiovascular: Normal rate, regular rhythm and normal heart sounds.  Respiratory: Effort normal and breath sounds normal.  GI: Soft. Bowel sounds are normal.  Musculoskeletal: Normal range of motion.  Neurological: She is alert and oriented to person, place, and time.  Skin: Skin is warm and dry.  Psychiatric: She has a normal mood and affect. Her behavior is normal. Judgment and thought content normal.    MAU Course  Procedures  MDM CCUA UPT CBC with Diff Wet Prep GC/CT -- pending HIV -- pending  Results for orders placed or performed during the hospital encounter of 08/04/17 (from the past 24 hour(s))  Urinalysis, Routine w reflex microscopic     Status: Abnormal   Collection Time: 08/04/17  8:52 AM  Result Value Ref Range   Color, Urine YELLOW YELLOW   APPearance HAZY (A) CLEAR   Specific Gravity, Urine 1.030 1.005 - 1.030   pH 5.0 5.0 - 8.0   Glucose, UA NEGATIVE NEGATIVE mg/dL   Hgb urine dipstick LARGE (A) NEGATIVE   Bilirubin Urine NEGATIVE NEGATIVE   Ketones, ur NEGATIVE NEGATIVE mg/dL   Protein, ur 30 (A) NEGATIVE mg/dL   Nitrite NEGATIVE NEGATIVE   Leukocytes, UA NEGATIVE NEGATIVE   RBC / HPF 0-5 0 - 5 RBC/hpf   WBC, UA 0-5 0 - 5 WBC/hpf   Bacteria, UA RARE (A) NONE SEEN   Squamous Epithelial / LPF 0-5 (A) NONE SEEN   Mucus PRESENT   Pregnancy, urine POC     Status: None   Collection Time: 08/04/17  9:06 AM  Result Value Ref Range   Preg Test, Ur NEGATIVE NEGATIVE  CBC with Differential/Platelet     Status: None   Collection Time: 08/04/17  9:29 AM  Result Value Ref Range   WBC 5.7 4.0 - 10.5 K/uL   RBC 4.42 3.87 - 5.11 MIL/uL   Hemoglobin 13.1 12.0 - 15.0 g/dL   HCT 40.9 81.1 - 91.4 %   MCV 89.1 78.0 - 100.0 fL   MCH 29.6 26.0 - 34.0 pg   MCHC 33.2 30.0 - 36.0 g/dL    RDW 78.2 95.6 - 21.3 %   Platelets 230 150 - 400 K/uL   Neutrophils Relative % 57 %   Neutro Abs 3.2 1.7 - 7.7 K/uL   Lymphocytes Relative 36 %   Lymphs Abs 2.1 0.7 - 4.0 K/uL   Monocytes Relative 3 %   Monocytes Absolute 0.2 0.1 - 1.0 K/uL   Eosinophils Relative 4 %   Eosinophils Absolute 0.2 0.0 - 0.7 K/uL   Basophils Relative 0 %   Basophils Absolute  0.0 0.0 - 0.1 K/uL  Wet prep, genital     Status: Abnormal   Collection Time: 08/04/17  9:35 AM  Result Value Ref Range   Yeast Wet Prep HPF POC NONE SEEN NONE SEEN   Trich, Wet Prep NONE SEEN NONE SEEN   Clue Cells Wet Prep HPF POC PRESENT (A) NONE SEEN   WBC, Wet Prep HPF POC FEW (A) NONE SEEN   Sperm NONE SEEN     Assessment and Plan  Bacterial vaginitis  - Rx for Flagyl 500 mg BID x 7 days - Information provided on BV   Vaginal bleeding  - Rx for Ibuprofen 600 mg every 6 hrs - Information provided on AUB - Advised to F/U with Dr. Penne LashLeggett in 1 month   - Discharge patient - Patient verbalized an understanding of the plan of care and agrees.     Raelyn Moraolitta Bruk [place], MSN, CNM 08/04/2017, 9:35 AM

## 2017-08-04 NOTE — Discharge Instructions (Signed)
[facility]  ° ° °[facility]       Phone: [phone] ° °[facility] Phone: [phone] ° °[facility] [place]  Phone: [phone] ° °[facility] [place]  Phone: [phone] ° °[facility] [place]  Phone: [phone] ° °[facility]       Phone: [phone] ° °[facility]    Phone: [phone]  ° °[facility]    Phone: [phone] ° °[facility]    Phone: [phone] ° °[facility]    Phone: [phone] ° °[facility]    Phone: [phone] ° °[facility]       Phone: [phone]  ° °[facility]  Phone: [phone] ° °[facility]    Phone: [phone] ° °[facility]   Phone: [phone] ° °[facility]      Phone: [phone] ° °[facility]    Phone: [phone] ° °

## 2017-08-04 NOTE — MAU Note (Signed)
Reports she and husband are trying to conceieve. Back pain and abdominal pain and spotting since yesterday. Bleeding has gotten heavier and is now needing a pad.

## 2017-08-05 LAB — GC/CHLAMYDIA PROBE AMP (~~LOC~~) NOT AT ARMC
CHLAMYDIA, DNA PROBE: NEGATIVE
Neisseria Gonorrhea: NEGATIVE

## 2017-08-05 LAB — HIV ANTIBODY (ROUTINE TESTING W REFLEX): HIV Screen 4th Generation wRfx: NONREACTIVE

## 2017-10-31 ENCOUNTER — Encounter: Payer: Self-pay | Admitting: Internal Medicine

## 2017-11-16 ENCOUNTER — Other Ambulatory Visit: Payer: Self-pay

## 2017-11-16 ENCOUNTER — Encounter (HOSPITAL_BASED_OUTPATIENT_CLINIC_OR_DEPARTMENT_OTHER): Payer: Self-pay | Admitting: Emergency Medicine

## 2017-11-16 DIAGNOSIS — B353 Tinea pedis: Secondary | ICD-10-CM | POA: Insufficient documentation

## 2017-11-16 DIAGNOSIS — Z79899 Other long term (current) drug therapy: Secondary | ICD-10-CM | POA: Insufficient documentation

## 2017-11-16 DIAGNOSIS — F17210 Nicotine dependence, cigarettes, uncomplicated: Secondary | ICD-10-CM | POA: Diagnosis not present

## 2017-11-16 DIAGNOSIS — M79651 Pain in right thigh: Secondary | ICD-10-CM | POA: Insufficient documentation

## 2017-11-16 NOTE — ED Triage Notes (Signed)
Patient states that she is having pain to her right thigh x 3 -4 days  - the patient states that it is painful to walk on

## 2017-11-17 ENCOUNTER — Emergency Department (HOSPITAL_BASED_OUTPATIENT_CLINIC_OR_DEPARTMENT_OTHER)
Admission: EM | Admit: 2017-11-17 | Discharge: 2017-11-17 | Disposition: A | Discharge status: Home / Self Care | Payer: 59 | Attending: Emergency Medicine | Admitting: Emergency Medicine

## 2017-11-17 DIAGNOSIS — B353 Tinea pedis: Secondary | ICD-10-CM

## 2017-11-17 DIAGNOSIS — M79651 Pain in right thigh: Secondary | ICD-10-CM

## 2017-11-17 LAB — D-DIMER, QUANTITATIVE (NOT AT ARMC): D DIMER QUANT: 0.33 ug{FEU}/mL (ref 0.00–0.50)

## 2017-11-17 MED ORDER — CYCLOBENZAPRINE HCL 10 MG PO TABS: 10.0000 mg | ORAL_TABLET | Freq: Three times a day (TID) | ORAL | 0 refills | Status: DC | PRN

## 2017-11-17 MED ORDER — TERBINAFINE HCL 250 MG PO TABS: 250.0000 mg | ORAL_TABLET | Freq: Every day | ORAL | 0 refills | Status: DC

## 2017-11-17 MED ORDER — NAPROXEN 250 MG PO TABS
500.0000 mg | ORAL_TABLET | Freq: Once | ORAL | Status: AC
  Administered 2017-11-17: 500 mg via ORAL
  Filled 2017-11-17: qty 2

## 2017-11-17 MED ORDER — NAPROXEN 375 MG PO TABS: ORAL_TABLET | ORAL | 0 refills | Status: DC

## 2017-11-17 NOTE — ED Notes (Signed)
Pt c/o right thigh pain x 2-3 days. Pain gives her the sensation of "needing to pee" at times. Denies injury.

## 2017-11-17 NOTE — ED Notes (Signed)
Pt given d/c instructions as per chart. Rx x 3 with precautions. Verbalizes understanding. No questions. 

## 2017-11-17 NOTE — ED Provider Notes (Signed)
[facility] Provider Note: Lowella DellJ. Lane Vela Render, MD, FACEP  CSN: [id number] MRN: [id number] ARRIVAL: 11/16/17 at 2043 ROOM: MH08/[facility]08   CHIEF COMPLAINT  Leg Pain   HISTORY OF PRESENT ILLNESS  11/17/17 1:35 AM Traci Peterson is a 32 y.o. female who complains of pain in her right anterior thigh for 3 to 4 days.  Pain is moderate and worse with ambulation or with flexion and extension of her right foot.  It is not worse with movement of her right leg at the hip.  There is no associated swelling or discoloration.  She denies chest pain or shortness of breath.  She denies injury.  She also complains of irritation of her toes with peeling of skin consistent with previous tinea pedis.  She has been using over-the-counter antifungal creams without improvement.  She states she previously required treatment with oral Lamisil to get relief.   Past Medical History:  Diagnosis Date  . Anxiety    no current med.  . Cervix prolapsed into vagina   . Chronic headaches   . Gall stones   . HPV in female   . Jaw snapping    states jaw pops if opens mouth too wide  . Ovarian cyst   . PCOS (polycystic ovarian syndrome)   . Pilonidal cyst 02/2013  . Preterm labor 2016   PPROM @ 18 wks, delivery of IUFD @ 21 wks  . Vaginal Pap smear, abnormal     Past Surgical History:  Procedure Laterality Date  . CHOLECYSTECTOMY N/A 07/21/2016   Procedure: LAPAROSCOPIC CHOLECYSTECTOMY;  Surgeon: Abigail Miyamotoouglas Blackman, MD;  [facility];  Service: General;  Laterality: N/A;  . CHOLECYSTECTOMY    . PILONIDAL CYST EXCISION N/A 03/12/2013   Procedure: CYST EXCISION PILONIDAL EXTENSIVE;  Surgeon: Shelly Rubensteinouglas A Blackman, MD;  Location: [facility];  Service: General;  Laterality: N/A;  . WISDOM TOOTH EXTRACTION      Family History  Problem Relation Age of Onset  . Hypertension Mother   . Hyperlipidemia Mother   . Diabetes Mother   . Thyroid disease Mother   . Diabetes Father   .  Hypertension Father   . Alcohol abuse Father   . Breast cancer Maternal Aunt   . Lung cancer Paternal Grandfather     Social History   Tobacco Use  . Smoking status: Current Every Day Smoker    Packs/day: 0.25    Types: Cigarettes  . Smokeless tobacco: Never Used  . Tobacco comment: 3-5 cig./day  Substance Use Topics  . Alcohol use: No    Frequency: Never    Comment: socially  . Drug use: No    Prior to Admission medications   Medication Sig Start Date End Date Taking? Authorizing Provider  ibuprofen (ADVIL,MOTRIN) 600 MG tablet Take 1 tablet (600 mg total) by mouth every 6 (six) hours as needed. 08/04/17   Raelyn Moraawson, Rolitta, CNM  Multiple Vitamin (MULTIVITAMIN) LIQD Take 30 mLs by mouth daily.    [provider]    Allergies Lactose intolerance (gi)   REVIEW OF SYSTEMS  Negative except as noted here or in the History of Present Illness.   PHYSICAL EXAMINATION  Initial Vital Signs Blood pressure (!) 106/43, pulse 89, temperature 98.1 F (36.7 C), temperature source Oral, resp. rate 16, height 5\' 2"  (1.575 m), weight 116.1 kg (256 lb), last menstrual period 10/27/2017, SpO2 100 %.  Examination General: Well-developed, well-nourished female in no acute distress; appearance consistent with age of record HENT: normocephalic;  atraumatic Eyes: pupils equal, round and reactive to light; extraocular muscles intact Neck: supple Heart: regular rate and rhythm Lungs: clear to auscultation bilaterally Abdomen: soft; nondistended; nontender; bowel sounds present Extremities: No deformity; full range of motion; pulses normal; tenderness of proximal right anterior thigh without erythema or warmth, pain not worse with movement of the right leg at the hip, pain exacerbated by movement of the right foot at the ankle Neurologic: Awake, alert and oriented; motor function intact in all extremities and symmetric; no facial droop Skin: Warm and dry; irritated, peeling skin of toes  and interphalangeal spaces Psychiatric: Normal mood and affect   RESULTS  Summary of this visit's results, reviewed by myself:   EKG Interpretation  Date/Time:    Ventricular Rate:    PR Interval:    QRS Duration:   QT Interval:    QTC Calculation:   R Axis:     Text Interpretation:        Laboratory Studies: Results for orders placed or performed during the hospital encounter of 11/17/17 (from the past 24 hour(s))  D-dimer, quantitative (not at [facility])     Status: None   Collection Time: 11/17/17  2:12 AM  Result Value Ref Range   D-Dimer, Quant 0.33 0.00 - 0.50 ug/mL-FEU   Imaging Studies: No results found.  ED COURSE and MDM  Nursing notes and initial vitals signs, including pulse oximetry, reviewed.  Vitals:   11/16/17 2101 11/16/17 2102 11/17/17 0015 11/17/17 0215  BP: (!) 100/52  (!) 106/43 (!) 101/59  Pulse: (!) 102  89 88  Resp: 18  16 18   Temp: 98.3 F (36.8 C)  98.1 F (36.7 C) 98.1 F (36.7 C)  TempSrc: Oral  Oral Oral  SpO2: 100%  100% 99%  Weight:  116.1 kg (256 lb)    Height:  5\' 2"  (1.575 m)     The patient's pain is likely musculoskeletal.  Her d-dimer is negative and she has no significant risk factors for DVT.  We will treat with Lamisil for recalcitrant tinea pedis.  PROCEDURES    ED DIAGNOSES     ICD-10-CM   1. Pain of right thigh M79.651   2. Tinea pedis of both feet B35.3        Kelley Polinsky, MD 11/17/17 [phone]

## 2017-11-27 ENCOUNTER — Other Ambulatory Visit (INDEPENDENT_AMBULATORY_CARE_PROVIDER_SITE_OTHER): Payer: 59

## 2017-11-27 DIAGNOSIS — Z3200 Encounter for pregnancy test, result unknown: Secondary | ICD-10-CM

## 2017-11-27 DIAGNOSIS — Z3202 Encounter for pregnancy test, result negative: Secondary | ICD-10-CM

## 2017-11-27 LAB — POCT URINE PREGNANCY: PREG TEST UR: POSITIVE — AB

## 2017-11-27 NOTE — Progress Notes (Signed)
Pt here for a verification of pregnancy letter and a pregnancy test. Pregnancy test is positive and letter printed for patient. Pt had many concerns about this pregnancy and was upset at the fact that her new OB visit wouldn't be until she was 9 weeks. Dr.Constant spoke with her and reassured her.

## 2017-11-30 ENCOUNTER — Inpatient Hospital Stay (HOSPITAL_COMMUNITY): Payer: 59

## 2017-11-30 ENCOUNTER — Encounter: Payer: Self-pay | Admitting: Student

## 2017-11-30 ENCOUNTER — Inpatient Hospital Stay (HOSPITAL_COMMUNITY)
Admission: AD | Admit: 2017-11-30 | Discharge: 2017-11-30 | Disposition: A | Discharge status: Home / Self Care | Payer: 59 | Source: Ambulatory Visit | Attending: Obstetrics and Gynecology | Admitting: Obstetrics and Gynecology

## 2017-11-30 DIAGNOSIS — O24111 Pre-existing diabetes mellitus, type 2, in pregnancy, first trimester: Secondary | ICD-10-CM | POA: Diagnosis not present

## 2017-11-30 DIAGNOSIS — F17210 Nicotine dependence, cigarettes, uncomplicated: Secondary | ICD-10-CM | POA: Diagnosis not present

## 2017-11-30 DIAGNOSIS — E119 Type 2 diabetes mellitus without complications: Secondary | ICD-10-CM | POA: Insufficient documentation

## 2017-11-30 DIAGNOSIS — O3680X0 Pregnancy with inconclusive fetal viability, not applicable or unspecified: Secondary | ICD-10-CM

## 2017-11-30 DIAGNOSIS — O26891 Other specified pregnancy related conditions, first trimester: Secondary | ICD-10-CM

## 2017-11-30 DIAGNOSIS — R109 Unspecified abdominal pain: Secondary | ICD-10-CM | POA: Diagnosis not present

## 2017-11-30 DIAGNOSIS — R1032 Left lower quadrant pain: Secondary | ICD-10-CM | POA: Diagnosis present

## 2017-11-30 DIAGNOSIS — Z3A01 Less than 8 weeks gestation of pregnancy: Secondary | ICD-10-CM

## 2017-11-30 DIAGNOSIS — O99331 Smoking (tobacco) complicating pregnancy, first trimester: Secondary | ICD-10-CM | POA: Insufficient documentation

## 2017-11-30 DIAGNOSIS — O283 Abnormal ultrasonic finding on antenatal screening of mother: Secondary | ICD-10-CM

## 2017-11-30 LAB — COMPREHENSIVE METABOLIC PANEL
ALBUMIN: 3.8 g/dL (ref 3.5–5.0)
ALT: 51 U/L — ABNORMAL HIGH (ref 0–44)
ANION GAP: 9 (ref 5–15)
AST: 38 U/L (ref 15–41)
Alkaline Phosphatase: 68 U/L (ref 38–126)
BUN: 8 mg/dL (ref 6–20)
CHLORIDE: 104 mmol/L (ref 98–111)
CO2: 20 mmol/L — AB (ref 22–32)
Calcium: 8.7 mg/dL — ABNORMAL LOW (ref 8.9–10.3)
Creatinine, Ser: 0.58 mg/dL (ref 0.44–1.00)
GFR calc Af Amer: >60 mL/min (ref >60)
GFR calc non Af Amer: >60 mL/min (ref >60)
GLUCOSE: 240 mg/dL — AB (ref 70–99)
POTASSIUM: 4.1 mmol/L (ref 3.5–5.1)
SODIUM: 133 mmol/L — AB (ref 135–145)
Total Bilirubin: 0.2 mg/dL — ABNORMAL LOW (ref 0.3–1.2)
Total Protein: 7 g/dL (ref 6.5–8.1)

## 2017-11-30 LAB — HCG, QUANTITATIVE, PREGNANCY: hCG, Beta Chain, Quant, S: 2973 m[IU]/mL — ABNORMAL HIGH (ref <5)

## 2017-11-30 LAB — CBC
HEMATOCRIT: 40.1 % (ref 36.0–46.0)
HEMOGLOBIN: 13.8 g/dL (ref 12.0–15.0)
MCH: 30.6 pg (ref 26.0–34.0)
MCHC: 34.4 g/dL (ref 30.0–36.0)
MCV: 88.9 fL (ref 78.0–100.0)
Platelets: 229 10*3/uL (ref 150–400)
RBC: 4.51 MIL/uL (ref 3.87–5.11)
RDW: 14.8 % (ref 11.5–15.5)
WBC: 10.3 10*3/uL (ref 4.0–10.5)

## 2017-11-30 LAB — URINALYSIS, ROUTINE W REFLEX MICROSCOPIC
BILIRUBIN URINE: NEGATIVE
Bacteria, UA: NONE SEEN
Glucose, UA: >=500 mg/dL — AB
Hgb urine dipstick: NEGATIVE
Ketones, ur: 5 mg/dL — AB
Nitrite: NEGATIVE
PH: 5 (ref 5.0–8.0)
Protein, ur: 30 mg/dL — AB
SPECIFIC GRAVITY, URINE: 1.035 — AB (ref 1.005–1.030)

## 2017-11-30 LAB — WET PREP, GENITAL
SPERM: NONE SEEN
Trich, Wet Prep: NONE SEEN
YEAST WET PREP: NONE SEEN

## 2017-11-30 LAB — GLUCOSE, CAPILLARY: Glucose-Capillary: 252 mg/dL — ABNORMAL HIGH (ref 70–99)

## 2017-11-30 MED ORDER — ACCU-CHEK NANO SMARTVIEW W/DEVICE KIT: 1.0000 | PACK | 0 refills | Status: DC

## 2017-11-30 MED ORDER — GLUCOSE BLOOD VI STRP: ORAL_STRIP | 12 refills | Status: DC

## 2017-11-30 MED ORDER — METFORMIN HCL 500 MG PO TABS: 500.0000 mg | ORAL_TABLET | Freq: Two times a day (BID) | ORAL | 1 refills | Status: DC

## 2017-11-30 MED ORDER — ACCU-CHEK FASTCLIX LANCETS MISC: 1.0000 [IU] | Freq: Four times a day (QID) | 12 refills | Status: DC

## 2017-11-30 NOTE — Discharge Instructions (Signed)
Type 1 or Type 2 Diabetes Mellitus During Pregnancy, Self Care Caring for yourself during your pregnancy when you have type 1 diabetes (type 1 diabetes mellitus) or type 2 diabetes (type 2 diabetes mellitus) means keeping your blood sugar (glucose) under control with a balance of:  Nutrition.  Exercise.  Lifestyle changes.  Insulin or medicines, if necessary.  Support from your team of health care providers and others.  The following information explains what you need to know to manage your diabetes at home during your pregnancy. What do I need to do to manage my blood glucose?  Check your blood glucose every day, as often as told by your health care provider.  Contact your health care provider if your blood glucose is above your target for 2 tests in a row.  Have your A1c (hemoglobin A1c) level checked at least two times a year, or as often as told by your health care provider. Your health care provider will set individualized treatment goals for you. Generally, the goal of treatment is to maintain the following blood glucose levels during pregnancy:  After not eating (after fasting) for 8 hours: at or below 95 mg/dL (5.3 mmol/L).  After meals (postprandial): ? One hour after a meal: at or below 140 mg/dL (7.8 mmol/L). ? Two hours after a meal: at or below 120 mg/dL (6.7 mmol/L).  A1c level: 6-6.5%  What do I need to know about hyperglycemia and hypoglycemia? What is hyperglycemia? Hyperglycemia, also called high blood glucose, occurs when blood glucose is too high. Make sure you know the early signs of hyperglycemia, such as:  Increased thirst.  Hunger.  Feeling very tired.  Needing to urinate more often than usual.  Blurry vision.  What is hypoglycemia? Hypoglycemia, also called low blood glucose, occurs with a blood glucose level at or below 70 mg/dL (3.9 mmol/L). The risk for hypoglycemia increases during or after exercise, during sleep, during illness, and when  skipping meals or fasting. It is important to know the symptoms of hypoglycemia and treat it right away. Always have a 15-gram rapid-acting carbohydrate snack with you to treat low blood glucose. Family members and close friends should also know the symptoms and should understand how to treat hypoglycemia, in case you are not able to treat yourself. What are the symptoms of hypoglycemia? Hypoglycemia symptoms can include:  Hunger.  Anxiety.  Sweating and feeling clammy.  Confusion.  Dizziness or feeling light-headed.  Sleepiness.  Nausea.  Increased heart rate.  Headache.  Blurry vision.  Seizure.  Nightmares.  Tingling or numbness around the mouth, lips, or tongue.  A change in speech.  Decreased ability to concentrate.  A change in coordination.  Restless sleep.  Tremors or shakes.  Fainting.  Irritability.  How do I treat hypoglycemia?  If you are alert and able to swallow safely, follow the 15:15 rule:  Take 15 grams of a rapid-acting carbohydrate. Rapid-acting options include: ? 1 tube of glucose gel. ? 3 glucose pills. ? 6-8 pieces of hard candy. ? 4 oz (120 mL) of fruit juice . ? 4 oz (120 mL) of regular (not diet) soda.  Check your blood glucose 15 minutes after you take the carbohydrate.  If the repeat blood glucose level is still at or below 70 mg/dL (3.9 mmol/L), take 15 grams of a carbohydrate again.  If your blood glucose level does not increase above 70 mg/dL (3.9 mmol/L) after 3 tries, seek emergency medical care.  After your blood glucose level returns   to normal, eat a meal or a snack within 1 hour.  How do I treat severe hypoglycemia? Severe hypoglycemia is when your blood glucose level is at or below 54 mg/dL (3 mmol/L). Severe hypoglycemia is an emergency. Do not wait to see if the symptoms will go away. Get medical help right away. Call your local emergency services (911 in the [place] Do not drive yourself to the hospital. If you  have severe hypoglycemia and you cannot eat or drink, you may need an injection of glucagon. A family member or close friend should learn how to check your blood glucose and how to give you a glucagon injection. Ask your health care provider if you need to have an emergency glucagon injection kit available. Severe hypoglycemia may need to be treated in a hospital. The treatment may include getting glucose through an IV tube. You may also need treatment for the cause of your hypoglycemia. Can having diabetes put me at risk for other conditions? Having diabetes can put you at risk for other long-term (chronic) conditions, such as heart disease and kidney disease. Your health care provider may prescribe medicines to help prevent complications from diabetes. These medicines may include:  Aspirin.  Medicine to lower cholesterol.  Medicine to control blood pressure.  What else can I do to manage my diabetes? Take your diabetes medicines as told  If your health care provider prescribed insulin or diabetes medicines, take them every day.  Do not run out of insulin or other diabetes medicines that you take. Plan ahead so you always have these available.  If you use insulin, adjust your dosage based on how physically active you are and what foods you eat. Your health care provider will tell you how to adjust your dosage. Your health care provider may recommend that you take one low-dose aspirin (81 mg) each day to help prevent high blood pressure during pregnancy (preeclampsia or eclampsia). You may be at risk for preeclampsia or eclampsia if:  You had any of the following during a previous pregnancy: ? Preeclampsia or eclampsia. ? A fetal growth rate that was slower than normal. ? An early (preterm) birth. ? Separation of the placenta from the uterus (placental abruption). ? Fetal loss.  You are pregnant with more than one baby.  You have other medical conditions, such as high blood pressure or  an autoimmune disease.  Make healthy food choices  The things that you eat and drink affect your blood glucose and your insulin dosage. Making good choices helps to control your diabetes and prevent other health problems. A healthy meal plan includes eating lean proteins, complex carbohydrates, fresh fruits and vegetables, low-fat dairy products, and healthy fats. Make an appointment to see a diet and nutrition specialist (registered dietitian) to help you create an eating plan that is right for you. Make sure that you:  Follow instructions from your health care provider about eating or drinking restrictions.  Drink enough fluid to keep your urine clear or pale yellow.  Eat healthy snacks between nutritious meals.  Track the carbohydrates that you eat. Do this by reading food labels and learning the standard serving sizes of foods.  Follow your sick day plan whenever you cannot eat or drink as usual. Make this plan in advance with your health care provider.  Stay active   Do at least 30 minutes of physical activity a day, or as much physical activity as your health care provider recommends during your pregnancy.  If you start  a new exercise or activity, work with your health care provider to adjust your insulin, medicines, or food intake as needed. Make healthy lifestyle choices  Do not use any tobacco products, such as cigarettes, chewing tobacco, and e-cigarettes. If you need help quitting, ask your health care provider.  Do not use alcohol.  Learn to manage stress. If you need help with this, ask your health care provider. Care for your body  Keep your immunizations up to date.  Schedule an eye exam during your first trimester of your pregnancy, or as told by your health care provider.  Check your skin and feet every day for cuts, bruises, redness, blisters, or sores. Schedule a foot exam with your health care provider once every year.  Brush your teeth and gums two times a  day, and floss at least one time a day. Visit your dentist at least once every 6 months.  Maintain a healthy weight during your pregnancy. General instructions   Take over-the-counter and prescription medicines only as told by your health care provider.  Talk with your health care provider about your risk for high blood pressure during pregnancy (preeclampsia or eclampsia).  Share your diabetes management plan with people in your workplace, school, and household.  Check your urine ketones when you are ill and as told by your health care provider.  Carry a medical alert card or wear medical alert jewelry.  Ask your health care provider: ? Do I need to meet with a diabetes educator? ? Where can I find a support group for people with diabetes?  Keep all follow-up visits during your pregnancy (prenatal) and after delivery (postnatal) as told by your health care provider. This is important. Where to find more information: For more information about diabetes, visit:  American Diabetes Association (ADA): www.diabetes.org  American Association of Diabetes Educators (AADE): https://www.diabeteseducator.org/patient-resources  This information is not intended to replace advice given to you by your health care provider. Make sure you discuss any questions you have with your health care provider. Document Released: 09/05/2015 Document Revised: 10/20/2015 Document Reviewed: 06/17/2015 Elsevier Interactive Patient Education  [employer].

## 2017-11-30 NOTE — MAU Provider Note (Signed)
History     CSN: [id number]  Arrival date and time: 11/30/17 1042   First Provider Initiated Contact with Patient 11/30/17 1122      Chief Complaint  Patient presents with  . Abdominal Pain   HPI  Traci Peterson is a 32 y.o. G3P0200 at [redacted]w[redacted]d by unsure LMP who presents with abdominal pain. Symptoms have been going on "for a while" but worsened yesterday after moving her grandmother off the couch during a stroke. Suprapubic & LLQ pain that is worse with movement and feels like pulling. Rates pain 6/10. Previously took flexeril with relief but has not treated symptoms since worsening yesterday. Movement & position changes make pain worse; nothing makes pain better. Denies dysuria, vaginal bleeding, or vaginal discharge. Denies n/v/d, constipation. Concerned d/t hx of second trimester losses x 2.   OB History    Gravida  3   Para  2   Term  0   Preterm  2   AB  0   Living        SAB  0   TAB  0   Ectopic  0   Multiple  1   Live Births  2           Past Medical History:  Diagnosis Date  . Anxiety    no current med.  . Cervix prolapsed into vagina   . Chronic headaches   . Gall stones   . HPV in female   . Jaw snapping    states jaw pops if opens mouth too wide  . Ovarian cyst   . PCOS (polycystic ovarian syndrome)   . Pilonidal cyst 02/2013  . Preterm labor 2016   PPROM @ 18 wks, delivery of IUFD @ 21 wks  . Vaginal Pap smear, abnormal     Past Surgical History:  Procedure Laterality Date  . CHOLECYSTECTOMY N/A 07/21/2016   Procedure: LAPAROSCOPIC CHOLECYSTECTOMY;  Surgeon: Abigail Miyamoto, MD;  Location: [facility] OR;  Service: General;  Laterality: N/A;  . PILONIDAL CYST EXCISION N/A 03/12/2013   Procedure: CYST EXCISION PILONIDAL EXTENSIVE;  Surgeon: Shelly Rubenstein, MD;  Location: [facility];  Service: General;  Laterality: N/A;  . WISDOM TOOTH EXTRACTION      Family History  Problem Relation Age of Onset  . Hypertension Mother    . Hyperlipidemia Mother   . Diabetes Mother   . Thyroid disease Mother   . Diabetes Father   . Hypertension Father   . Alcohol abuse Father   . Breast cancer Maternal Aunt   . Lung cancer Paternal Grandfather     Social History   Tobacco Use  . Smoking status: Current Every Day Smoker    Packs/day: 0.25    Types: Cigarettes  . Smokeless tobacco: Never Used  . Tobacco comment: 1 every other day  Substance Use Topics  . Alcohol use: No    Frequency: Never    Comment: socially  . Drug use: No    Allergies:  Allergies  Allergen Reactions  . Lactose Intolerance (Gi) Diarrhea    Medications Prior to Admission  Medication Sig Dispense Refill Last Dose  . cyclobenzaprine (FLEXERIL) 10 MG tablet Take 1 tablet (10 mg total) by mouth 3 (three) times daily as needed for muscle spasms. 20 tablet 0   . Multiple Vitamin (MULTIVITAMIN) LIQD Take 30 mLs by mouth daily.   Past Week at Unknown time  . naproxen (NAPROSYN) 375 MG tablet Take 1 tablet twice daily as needed  for thigh pain. 15 tablet 0   . terbinafine (LAMISIL) 250 MG tablet Take 1 tablet (250 mg total) by mouth daily. 14 tablet 0     Review of Systems  Constitutional: Negative.   Gastrointestinal: Positive for abdominal pain. Negative for constipation, diarrhea, nausea and vomiting.  Endocrine: Negative for polydipsia and polyuria.  Genitourinary: Negative.    Physical Exam   Blood pressure 119/68, pulse (!) 101, temperature 98 F (36.7 C), temperature source Oral, resp. rate 19, weight 266 lb 1.3 oz (120.7 kg), last menstrual period 10/27/2017, SpO2 97 %.  Physical Exam  Nursing note and vitals reviewed. Constitutional: She is oriented to person, place, and time. She appears well-developed and well-nourished. No distress.  HENT:  Head: Normocephalic and atraumatic.  Eyes: Conjunctivae are normal. Right eye exhibits no discharge. Left eye exhibits no discharge. No scleral icterus.  Neck: Normal range of motion.   Respiratory: Effort normal. No respiratory distress.  GI: Soft. There is tenderness in the right lower quadrant and suprapubic area. There is no rigidity, no rebound and no guarding.  Genitourinary: Cervix exhibits no motion tenderness. No bleeding in the vagina.  Genitourinary Comments: Cervix closed.  Unable to assess size of uterus on bimanual d/t body habitus.   Neurological: She is alert and oriented to person, place, and time.  Skin: Skin is warm and dry. She is not diaphoretic.  Psychiatric: She has a normal mood and affect. Her behavior is normal. Judgment and thought content normal.    MAU Course  Procedures Results for orders placed or performed during the hospital encounter of 11/30/17 (from the past 24 hour(s))  Urinalysis, Routine w reflex microscopic     Status: Abnormal   Collection Time: 11/30/17 11:00 AM  Result Value Ref Range   Color, Urine YELLOW YELLOW   APPearance CLOUDY (A) CLEAR   Specific Gravity, Urine 1.035 (H) 1.005 - 1.030   pH 5.0 5.0 - 8.0   Glucose, UA >=500 (A) NEGATIVE mg/dL   Hgb urine dipstick NEGATIVE NEGATIVE   Bilirubin Urine NEGATIVE NEGATIVE   Ketones, ur 5 (A) NEGATIVE mg/dL   Protein, ur 30 (A) NEGATIVE mg/dL   Nitrite NEGATIVE NEGATIVE   Leukocytes, UA TRACE (A) NEGATIVE   RBC / HPF 6-10 0 - 5 RBC/hpf   WBC, UA 6-10 0 - 5 WBC/hpf   Bacteria, UA NONE SEEN NONE SEEN   Squamous Epithelial / LPF 21-50 0 - 5   Mucus PRESENT   Wet prep, genital     Status: Abnormal   Collection Time: 11/30/17 11:36 AM  Result Value Ref Range   Yeast Wet Prep HPF POC NONE SEEN NONE SEEN   Trich, Wet Prep NONE SEEN NONE SEEN   Clue Cells Wet Prep HPF POC PRESENT (A) NONE SEEN   WBC, Wet Prep HPF POC MODERATE (A) NONE SEEN   Sperm NONE SEEN   Glucose, capillary     Status: Abnormal   Collection Time: 11/30/17 11:39 AM  Result Value Ref Range   Glucose-Capillary 252 (H) 70 - 99 mg/dL  CBC     Status: None   Collection Time: 11/30/17 11:49 AM  Result  Value Ref Range   WBC 10.3 4.0 - 10.5 K/uL   RBC 4.51 3.87 - 5.11 MIL/uL   Hemoglobin 13.8 12.0 - 15.0 g/dL   HCT 40.9 81.1 - 91.4 %   MCV 88.9 78.0 - 100.0 fL   MCH 30.6 26.0 - 34.0 pg   MCHC 34.4 30.0 -  36.0 g/dL   RDW 16.114.8 09.611.5 - 04.515.5 %   Platelets 229 150 - 400 K/uL  hCG, quantitative, pregnancy     Status: Abnormal   Collection Time: 11/30/17 11:49 AM  Result Value Ref Range   hCG, Beta Chain, Quant, S 2,973 (H) <5 mIU/mL  Comprehensive metabolic panel     Status: Abnormal   Collection Time: 11/30/17 11:49 AM  Result Value Ref Range   Sodium 133 (L) 135 - 145 mmol/L   Potassium 4.1 3.5 - 5.1 mmol/L   Chloride 104 98 - 111 mmol/L   CO2 20 (L) 22 - 32 mmol/L   Glucose, Bld 240 (H) 70 - 99 mg/dL   BUN 8 6 - 20 mg/dL   Creatinine, Ser 4.090.58 0.44 - 1.00 mg/dL   Calcium 8.7 (L) 8.9 - 10.3 mg/dL   Total Protein 7.0 6.5 - 8.1 g/dL   Albumin 3.8 3.5 - 5.0 g/dL   AST 38 15 - 41 U/L   ALT 51 (H) 0 - 44 U/L   Alkaline Phosphatase 68 38 - 126 U/L   Total Bilirubin 0.2 (L) 0.3 - 1.2 mg/dL   GFR calc non Af Amer >60 >60 mL/min   GFR calc Af Amer >60 >60 mL/min   Anion gap 9 5 - 15     MDM +UPT UA, wet prep, GC/chlamydia, CBC, ABO/Rh, quant hCG, and US today to rule out ectopic pregnancy O positive  U/a with >500 glucose. Reports eating a "bite of steak" this morning and drinking a pineapple soda prior to arrival. No polydipsia or polyuria. CBG 252. Anion gap 9.   Ultrasound shows IUGS, no YS or embryo. HCG 2973. Will bring back for repeat HCG  Reviewed CBG with Dr. Jolayne Pantheronstant. Will start on metformin 500 mg BID.   Assessment and Plan  A: 1. Pregnancy of unknown anatomic location   2. Abdominal pain during pregnancy in first trimester   3. Less than [redacted] weeks gestation of pregnancy   4. Type 2 diabetes mellitus affecting pregnancy in first trimester, antepartum    P: Discharge home Rx metformin 500 mg BID Rx diabetes testing supplies [facility]  [facility] Scheduled for f/u HCG in clinic on Monday afternoon Discussed reasons to return to MAU   Judeth HornErin Witney Huie 11/30/2017, 11:22 AM

## 2017-11-30 NOTE — MAU Note (Signed)
Traci Peterson is a 32 y.o. at 549w6d here in MAU reporting:  +lower mid abdominal pain "for sometime" patient has had "excrutiating" pain with activity that requires her to move side to side Reports having pulled her grandmother from the couch to the floor during a stroke episode yesterday and feels like she may have pulled a muscle. Because now the pain is sharp, pulling at times, and spasm like in nature. LMP: 10/27/17-11/02/17 Pain score: 6/10 +HPT; states has already been seen by [place]BGYN in [place] Vitals:   11/30/17 1058  BP: 138/68  Pulse: (!) 106  Resp: 19  Temp: 98 F (36.7 C)  SpO2: 97%     Lab orders placed from triage: ua

## 2017-12-02 ENCOUNTER — Telehealth: Payer: Self-pay

## 2017-12-02 ENCOUNTER — Ambulatory Visit (INDEPENDENT_AMBULATORY_CARE_PROVIDER_SITE_OTHER): Payer: 59 | Admitting: General Practice

## 2017-12-02 DIAGNOSIS — O0991 Supervision of high risk pregnancy, unspecified, first trimester: Secondary | ICD-10-CM

## 2017-12-02 DIAGNOSIS — O283 Abnormal ultrasonic finding on antenatal screening of mother: Secondary | ICD-10-CM

## 2017-12-02 DIAGNOSIS — O3680X0 Pregnancy with inconclusive fetal viability, not applicable or unspecified: Secondary | ICD-10-CM

## 2017-12-02 LAB — GC/CHLAMYDIA PROBE AMP (~~LOC~~) NOT AT ARMC
CHLAMYDIA, DNA PROBE: NEGATIVE
NEISSERIA GONORRHEA: NEGATIVE

## 2017-12-02 LAB — HCG, QUANTITATIVE, PREGNANCY: hCG, Beta Chain, Quant, S: 5222 m[IU]/mL — ABNORMAL HIGH (ref <5)

## 2017-12-02 NOTE — Progress Notes (Signed)
I have reviewed the chart and agree with nursing staff's documentation of this patient's encounter.  Ayomikun Starling, CNM 12/02/2017 7:53 PM    

## 2017-12-02 NOTE — Progress Notes (Signed)
Patient presents to office today for stat bhcg. Patient denies bleeding, reports occasional cramps but nothing severe. Discussed with patient we are monitoring her bhcg levels today & asked she wait in lobby for results/updated plan of care. Recommended she contact [place] office regarding diabetes ed appt. Patient verbalized understanding & had no questions at this time.   Reviewed bhcg levels with Thressa ShellerHeather Hogan who finds appropriate rise- patient should have follow up ultrasound in 10 days. Scheduled for 7/18 @ 9am.  Informed patient of results & follow up ultrasound appt. Patient verbalized understanding & had no questions.

## 2017-12-02 NOTE — Telephone Encounter (Signed)
Pt called stating that she needed a referral for nutrition and diabetes. Raiford NobleErin Lawerence, NP had sent the [facility] a message to send referral. Referral sent.

## 2017-12-04 ENCOUNTER — Encounter: Payer: 59 | Attending: Student | Admitting: Registered"

## 2017-12-04 DIAGNOSIS — Z3A00 Weeks of gestation of pregnancy not specified: Secondary | ICD-10-CM | POA: Diagnosis not present

## 2017-12-04 DIAGNOSIS — O0991 Supervision of high risk pregnancy, unspecified, first trimester: Secondary | ICD-10-CM | POA: Insufficient documentation

## 2017-12-04 DIAGNOSIS — O24119 Pre-existing diabetes mellitus, type 2, in pregnancy, unspecified trimester: Secondary | ICD-10-CM

## 2017-12-04 DIAGNOSIS — Z713 Dietary counseling and surveillance: Secondary | ICD-10-CM | POA: Diagnosis not present

## 2017-12-04 NOTE — Progress Notes (Signed)
Diabetes Self-Management Education  Visit Type: First/Initial  Appt. Start Time: 1130 Appt. End Time: 1230  ASSESSMENT Patient states she was informed she has T2DM due to BG value 252 mg/dL at 6 weeks into pregnancy. As of today's visit there is not a diagnosis of diabetes in her chart. Per chart A1c lab history: 6.0% 2643yrs ago, 5.8% 1 yr. RD educated on basics of both T2DM and gestational diabetes  Diabetes During pregnancy Self-Management Training:   States the definition of Gestational Diabetes  States why dietary management is important in controlling blood glucose  Describes the effects each nutrient has on blood glucose levels  Demonstrates ability to create a balanced meal plan  Demonstrates carbohydrate counting   States when to check blood glucose levels  Demonstrates proper blood glucose monitoring techniques  States the effect of stress and exercise on blood glucose levels  States the importance of limiting caffeine and abstaining from alcohol and smoking  Vitamin D note: According to the Academy of Nutrition and Dietetics (AND), many pregnant women do not consume adequate dietary calcium or vitamin. AND recommendation 6 on vitamin and mineral supplementation includes considering supplementation. The AND nutrition care in GDM guidelines does not address co-occurring medical condition of PCOS. Because women with PCOS often are vitamin D deficient, there is additional reason to evaluate adequacy of vitamin D and calcium intake in this patient. During initial visit, RD was not able to determine if her diet is adequate and believes additional vitamin D to the 600 units in her multi vitamin may be a good idea. Patient may also benefit from additional nutritional counseling.  Blood glucose monitor given: none, patient purchased reli on meter  Patient instructed to monitor glucose levels: FBS: 60 - <95; 1 hour: <140; 2 hour: <120  Patient received handouts:  Nutrition  Diabetes and Pregnancy, including carb counting list  Patient will be seen for follow-up as needed.

## 2017-12-09 ENCOUNTER — Inpatient Hospital Stay (HOSPITAL_COMMUNITY): Payer: 59

## 2017-12-09 ENCOUNTER — Inpatient Hospital Stay (HOSPITAL_COMMUNITY)
Admission: AD | Admit: 2017-12-09 | Discharge: 2017-12-09 | Disposition: A | Discharge status: Home / Self Care | Payer: 59 | Source: Ambulatory Visit | Attending: Obstetrics & Gynecology | Admitting: Obstetrics & Gynecology

## 2017-12-09 ENCOUNTER — Encounter (HOSPITAL_COMMUNITY): Payer: Self-pay | Admitting: *Deleted

## 2017-12-09 DIAGNOSIS — Z3A01 Less than 8 weeks gestation of pregnancy: Secondary | ICD-10-CM | POA: Diagnosis not present

## 2017-12-09 DIAGNOSIS — O209 Hemorrhage in early pregnancy, unspecified: Secondary | ICD-10-CM

## 2017-12-09 DIAGNOSIS — F17210 Nicotine dependence, cigarettes, uncomplicated: Secondary | ICD-10-CM | POA: Diagnosis not present

## 2017-12-09 DIAGNOSIS — Z7984 Long term (current) use of oral hypoglycemic drugs: Secondary | ICD-10-CM | POA: Diagnosis not present

## 2017-12-09 DIAGNOSIS — E282 Polycystic ovarian syndrome: Secondary | ICD-10-CM | POA: Diagnosis not present

## 2017-12-09 DIAGNOSIS — O99341 Other mental disorders complicating pregnancy, first trimester: Secondary | ICD-10-CM | POA: Diagnosis not present

## 2017-12-09 DIAGNOSIS — O99331 Smoking (tobacco) complicating pregnancy, first trimester: Secondary | ICD-10-CM | POA: Insufficient documentation

## 2017-12-09 DIAGNOSIS — Z8249 Family history of ischemic heart disease and other diseases of the circulatory system: Secondary | ICD-10-CM | POA: Insufficient documentation

## 2017-12-09 DIAGNOSIS — O24911 Unspecified diabetes mellitus in pregnancy, first trimester: Secondary | ICD-10-CM | POA: Insufficient documentation

## 2017-12-09 DIAGNOSIS — N83209 Unspecified ovarian cyst, unspecified side: Secondary | ICD-10-CM | POA: Insufficient documentation

## 2017-12-09 DIAGNOSIS — Z6790 Unspecified blood type, Rh positive: Secondary | ICD-10-CM

## 2017-12-09 DIAGNOSIS — F419 Anxiety disorder, unspecified: Secondary | ICD-10-CM | POA: Insufficient documentation

## 2017-12-09 DIAGNOSIS — O99281 Endocrine, nutritional and metabolic diseases complicating pregnancy, first trimester: Secondary | ICD-10-CM | POA: Diagnosis not present

## 2017-12-09 DIAGNOSIS — O3481 Maternal care for other abnormalities of pelvic organs, first trimester: Secondary | ICD-10-CM | POA: Diagnosis not present

## 2017-12-09 HISTORY — DX: Type 2 diabetes mellitus without complications: E11.9

## 2017-12-09 LAB — URINALYSIS, ROUTINE W REFLEX MICROSCOPIC
Bilirubin Urine: NEGATIVE
Glucose, UA: 50 mg/dL — AB
KETONES UR: NEGATIVE mg/dL
Nitrite: NEGATIVE
PH: 6 (ref 5.0–8.0)
Protein, ur: NEGATIVE mg/dL
RBC / HPF: >50 RBC/hpf — ABNORMAL HIGH (ref 0–5)
Specific Gravity, Urine: 1.017 (ref 1.005–1.030)

## 2017-12-09 LAB — WET PREP, GENITAL
CLUE CELLS WET PREP: NONE SEEN
SPERM: NONE SEEN
Trich, Wet Prep: NONE SEEN
Yeast Wet Prep HPF POC: NONE SEEN

## 2017-12-09 NOTE — MAU Note (Signed)
Pt reports vaginal bleeding when she awakened today. Denies pain.

## 2017-12-09 NOTE — MAU Provider Note (Signed)
History     CSN: [id number]  Arrival date and time: 12/09/17 4259   First Provider Initiated Contact with Patient 12/09/17 [phone]      Chief Complaint  Patient presents with  . Vaginal Bleeding   G3P0200 _0  here with VB. Reports seeing large amt drk red blood this am which ran down thigh. Denies pain or cramping. No recent IC. Having some vaginal itching, used Monistat-1 about 2 night ago. Was seen 9 days ago and had IUGS but no YS or FP. Recently started on Metformin for DM- reports FBS 90s and pp 110-140.    OB History    Gravida  3   Para  2   Term  0   Preterm  2   AB  0   Living  0     SAB  0   TAB  0   Ectopic  0   Multiple  1   Live Births  2           Past Medical History:  Diagnosis Date  . Anxiety    no current med.  . Cervix prolapsed into vagina   . Chronic headaches   . Diabetes mellitus without complication ([place])   . Gall stones   . HPV in female   . Jaw snapping    states jaw pops if opens mouth too wide  . Ovarian cyst   . PCOS (polycystic ovarian syndrome)   . Pilonidal cyst 02/2013  . Preterm labor 2016   PPROM @ 18 wks, delivery of IUFD @ 21 wks  . Vaginal Pap smear, abnormal     Past Surgical History:  Procedure Laterality Date  . CHOLECYSTECTOMY N/A 07/21/2016   Procedure: LAPAROSCOPIC CHOLECYSTECTOMY;  Surgeon: Coralie Keens, MD;  Location: [facility];  Service: General;  Laterality: N/A;  . PILONIDAL CYST EXCISION N/A 03/12/2013   Procedure: CYST EXCISION PILONIDAL EXTENSIVE;  Surgeon: Harl Bowie, MD;  Location: [facility];  Service: General;  Laterality: N/A;  . WISDOM TOOTH EXTRACTION      Family History  Problem Relation Age of Onset  . Hypertension Mother   . Hyperlipidemia Mother   . Diabetes Mother   . Thyroid disease Mother   . Diabetes Father   . Hypertension Father   . Alcohol abuse Father   . Breast cancer Maternal Aunt   . Lung cancer Paternal Grandfather     Social History    Tobacco Use  . Smoking status: Current Some Day Smoker    Packs/day: 0.25    Types: Cigarettes  . Smokeless tobacco: Never Used  . Tobacco comment: 1 every other day  Substance Use Topics  . Alcohol use: No    Frequency: Never    Comment: socially  . Drug use: No    Allergies:  Allergies  Allergen Reactions  . Lactose Intolerance (Gi) Diarrhea    Medications Prior to Admission  Medication Sig Dispense Refill Last Dose  . ACCU-CHEK FASTCLIX LANCETS MISC 1 Units by Percutaneous route 4 (four) times daily. 100 each 12   . Blood Glucose Monitoring Suppl (ACCU-CHEK NANO SMARTVIEW) w/Device KIT 1 kit by Subdermal route as directed. Check blood sugars for fasting, and two hours after breakfast, lunch and dinner (4 checks daily) 1 kit 0   . cyclobenzaprine (FLEXERIL) 10 MG tablet Take 1 tablet (10 mg total) by mouth 3 (three) times daily as needed for muscle spasms. 20 tablet 0   . glucose blood (ACCU-CHEK SMARTVIEW) test  strip Use as instructed to check blood sugars 100 each 12   . metFORMIN (GLUCOPHAGE) 500 MG tablet Take 1 tablet (500 mg total) by mouth 2 (two) times daily with a meal. 60 tablet 1   . Multiple Vitamin (MULTIVITAMIN) LIQD Take 30 mLs by mouth daily.   Past Week at Unknown time  . terbinafine (LAMISIL) 250 MG tablet Take 1 tablet (250 mg total) by mouth daily. 14 tablet 0     Review of Systems  Constitutional: Negative for fever.  Gastrointestinal: Negative for abdominal pain.  Genitourinary: Positive for vaginal bleeding. Negative for vaginal discharge.   Physical Exam   Blood pressure 103/62, pulse 93, temperature 97.9 F (36.6 C), temperature source Oral, resp. rate 18, height _0  (1.575 m), weight 270 lb (122.5 kg), last menstrual period 10/27/2017, SpO2 99 %.  Physical Exam  Constitutional: She is oriented to person, place, and time. She appears well-developed and well-nourished. No distress.  HENT:  Head: Normocephalic and atraumatic.  Neck: Normal  range of motion.  Respiratory: Effort normal. No respiratory distress.  GI: Soft. She exhibits no distension and no mass. There is no tenderness. There is no rebound and no guarding.  Genitourinary:  Genitourinary Comments: External: no lesions or erythema Vagina: rugated, pink, moist, scant bloody mucous discharge Uterus: non enlarged, anteverted, non tender, no CMT Adnexae: no masses, no tenderness left, no tenderness right Cervix closed/long   Musculoskeletal: Normal range of motion.  Neurological: She is alert and oriented to person, place, and time.  Skin: Skin is warm and dry.  Psychiatric: She has a normal mood and affect.   Results for orders placed or performed during the hospital encounter of 12/09/17 (from the past 24 hour(s))  Urinalysis, Routine w reflex microscopic     Status: Abnormal   Collection Time: 12/09/17  6:53 AM  Result Value Ref Range   Color, Urine YELLOW YELLOW   APPearance HAZY (A) CLEAR   Specific Gravity, Urine 1.017 1.005 - 1.030   pH 6.0 5.0 - 8.0   Glucose, UA 50 (A) NEGATIVE mg/dL   Hgb urine dipstick LARGE (A) NEGATIVE   Bilirubin Urine NEGATIVE NEGATIVE   Ketones, ur NEGATIVE NEGATIVE mg/dL   Protein, ur NEGATIVE NEGATIVE mg/dL   Nitrite NEGATIVE NEGATIVE   Leukocytes, UA SMALL (A) NEGATIVE   RBC / HPF >50 (H) 0 - 5 RBC/hpf   WBC, UA 21-50 0 - 5 WBC/hpf   Bacteria, UA RARE (A) NONE SEEN   Squamous Epithelial / LPF 0-5 0 - 5   Mucus PRESENT   Wet prep, genital     Status: Abnormal   Collection Time: 12/09/17  9:13 AM  Result Value Ref Range   Yeast Wet Prep HPF POC NONE SEEN NONE SEEN   Trich, Wet Prep NONE SEEN NONE SEEN   Clue Cells Wet Prep HPF POC NONE SEEN NONE SEEN   WBC, Wet Prep HPF POC FEW (A) NONE SEEN   Sperm NONE SEEN   US Ob Transvaginal  Result Date: 12/09/2017 CLINICAL DATA:  Assessment for viability EXAM: TRANSVAGINAL OB ULTRASOUND TECHNIQUE: Transvaginal ultrasound was performed for complete evaluation of the gestation  as well as the maternal uterus, adnexal regions, and pelvic cul-de-sac. COMPARISON:  November 30, 2017 FINDINGS: Intrauterine gestational sac: Visualized Yolk sac: Visualized Embryo:  Visualized Cardiac Activity: Visualized Heart Rate: 121 bpm CRL:   5 mm   6 w 1 d  [place] EDC: August 03, 2018 Subchorionic hemorrhage:  None visualized. Maternal uterus/adnexae: The cervical os is closed. Note that the gestational sac appears somewhat more inferiorly within the endometrium compared to 9 days prior. Right ovary measures 3.9 x 3.6 x 1.9 cm. Left ovary measures 4.3 x 2.5 x 2.2 cm. Tubular structure is seen within the left adnexal region measuring 5.3 x 3.9 x 3.9 cm. There is a small amount of free pelvic fluid. IMPRESSION: 1. Single live intrauterine gestation identified with estimated gestational age of approximately 21 weeks. Note that the gestational sac is slightly more inferiorly positioned within the endometrium compared to 9 days prior. Close clinical and imaging surveillance in this regard advised. 2. Tubular structure seen in left adnexa concerning for potential tubo-ovarian abscess/hydrosalpinx. 3.  Small amount of free pelvic fluid evident. Electronically Signed   By: Lowella Grip III M.D.   On: 12/09/2017 07:40   MAU Course  Procedures  MDM Labs and [place] ordered and reviewed. Viable IUP on [place], TOA vs hydrosalpinx on left noted- discussed with Dr. Roselie Awkward, since no signs of infection or pain will plan for expectant mngt. Stable for discharge home.  Assessment and Plan   1. [redacted] weeks gestation of pregnancy   2. Vaginal bleeding in pregnancy, first trimester   3. Blood type, Rh positive    Discharge home [facility] up at [facility] in 3 weeks SAB/bleeding precautions  Allergies as of 12/09/2017      Reactions   Lactose Intolerance (gi) Diarrhea      Medication List    TAKE these medications   ACCU-CHEK FASTCLIX LANCETS Misc 1 Units by Percutaneous route 4 (four) times daily.   ACCU-CHEK  NANO SMARTVIEW w/Device Kit 1 kit by Subdermal route as directed. Check blood sugars for fasting, and two hours after breakfast, lunch and dinner (4 checks daily)   cyclobenzaprine 10 MG tablet Commonly known as:  FLEXERIL Take 1 tablet (10 mg total) by mouth 3 (three) times daily as needed for muscle spasms.   glucose blood test strip Commonly known as:  ACCU-CHEK SMARTVIEW Use as instructed to check blood sugars   metFORMIN 500 MG tablet Commonly known as:  GLUCOPHAGE Take 1 tablet (500 mg total) by mouth 2 (two) times daily with a meal.   multivitamin Liqd Take 30 mLs by mouth daily.   terbinafine 250 MG tablet Commonly known as:  LAMISIL Take 1 tablet (250 mg total) by mouth daily.      Julianne Handler, CNM 12/09/2017, 10:21 AM

## 2017-12-09 NOTE — MAU Note (Signed)
Urine sent to lab 

## 2017-12-09 NOTE — Discharge Instructions (Signed)
Vaginal Bleeding During Pregnancy, First Trimester °A small amount of bleeding (spotting) from the vagina is common in early pregnancy. Sometimes the bleeding is normal and is not a problem, and sometimes it is a sign of something serious. Be sure to tell your doctor about any bleeding from your vagina right away. °Follow these instructions at home: °· Watch your condition for any changes. °· Follow your doctor's instructions about how active you can be. °· If you are on bed rest: °? You may need to stay in bed and only get up to use the bathroom. °? You may be allowed to do some activities. °? If you need help, make plans for someone to help you. °· Write down: °? The number of pads you use each day. °? How often you change pads. °? How soaked (saturated) your pads are. °· Do not use tampons. °· Do not douche. °· Do not have sex or orgasms until your doctor says it is okay. °· If you pass any tissue from your vagina, save the tissue so you can show it to your doctor. °· Only take medicines as told by your doctor. °· Do not take aspirin because it can make you bleed. °· Keep all follow-up visits as told by your doctor. °Contact a doctor if: °· You bleed from your vagina. °· You have cramps. °· You have labor pains. °· You have a fever that does not go away after you take medicine. °Get help right away if: °· You have very bad cramps in your back or belly (abdomen). °· You pass large clots or tissue from your vagina. °· You bleed more. °· You feel light-headed or weak. °· You pass out (faint). °· You have chills. °· You are leaking fluid or have a gush of fluid from your vagina. °· You pass out while pooping (having a bowel movement). °This information is not intended to replace advice given to you by your health care provider. Make sure you discuss any questions you have with your health care provider. °Document Released: 09/28/2013 Document Revised: 10/20/2015 Document Reviewed: 01/19/2013 °Elsevier Interactive  Patient Education © 2018 [employer] ° °

## 2017-12-10 ENCOUNTER — Encounter: Payer: Self-pay | Admitting: Obstetrics & Gynecology

## 2017-12-10 DIAGNOSIS — E559 Vitamin D deficiency, unspecified: Secondary | ICD-10-CM | POA: Insufficient documentation

## 2017-12-12 ENCOUNTER — Inpatient Hospital Stay (HOSPITAL_COMMUNITY)
Admission: AD | Admit: 2017-12-12 | Discharge: 2017-12-12 | Disposition: A | Discharge status: Home / Self Care | Payer: 59 | Source: Ambulatory Visit | Attending: Obstetrics and Gynecology | Admitting: Obstetrics and Gynecology

## 2017-12-12 ENCOUNTER — Ambulatory Visit (HOSPITAL_COMMUNITY): Payer: 59

## 2017-12-12 ENCOUNTER — Encounter (HOSPITAL_COMMUNITY): Payer: Self-pay

## 2017-12-12 DIAGNOSIS — Z8379 Family history of other diseases of the digestive system: Secondary | ICD-10-CM | POA: Diagnosis not present

## 2017-12-12 DIAGNOSIS — Z8349 Family history of other endocrine, nutritional and metabolic diseases: Secondary | ICD-10-CM | POA: Diagnosis not present

## 2017-12-12 DIAGNOSIS — Z811 Family history of alcohol abuse and dependence: Secondary | ICD-10-CM | POA: Diagnosis not present

## 2017-12-12 DIAGNOSIS — Z833 Family history of diabetes mellitus: Secondary | ICD-10-CM | POA: Insufficient documentation

## 2017-12-12 DIAGNOSIS — Z9049 Acquired absence of other specified parts of digestive tract: Secondary | ICD-10-CM | POA: Insufficient documentation

## 2017-12-12 DIAGNOSIS — O26891 Other specified pregnancy related conditions, first trimester: Secondary | ICD-10-CM | POA: Diagnosis not present

## 2017-12-12 DIAGNOSIS — Z3A01 Less than 8 weeks gestation of pregnancy: Secondary | ICD-10-CM | POA: Insufficient documentation

## 2017-12-12 DIAGNOSIS — Z803 Family history of malignant neoplasm of breast: Secondary | ICD-10-CM | POA: Insufficient documentation

## 2017-12-12 DIAGNOSIS — Z79899 Other long term (current) drug therapy: Secondary | ICD-10-CM | POA: Insufficient documentation

## 2017-12-12 DIAGNOSIS — O24911 Unspecified diabetes mellitus in pregnancy, first trimester: Secondary | ICD-10-CM | POA: Insufficient documentation

## 2017-12-12 DIAGNOSIS — B373 Candidiasis of vulva and vagina: Secondary | ICD-10-CM | POA: Insufficient documentation

## 2017-12-12 DIAGNOSIS — Z8249 Family history of ischemic heart disease and other diseases of the circulatory system: Secondary | ICD-10-CM | POA: Diagnosis not present

## 2017-12-12 DIAGNOSIS — O98811 Other maternal infectious and parasitic diseases complicating pregnancy, first trimester: Secondary | ICD-10-CM

## 2017-12-12 DIAGNOSIS — R109 Unspecified abdominal pain: Secondary | ICD-10-CM

## 2017-12-12 DIAGNOSIS — Z7984 Long term (current) use of oral hypoglycemic drugs: Secondary | ICD-10-CM | POA: Insufficient documentation

## 2017-12-12 DIAGNOSIS — E739 Lactose intolerance, unspecified: Secondary | ICD-10-CM | POA: Diagnosis not present

## 2017-12-12 DIAGNOSIS — B3731 Acute candidiasis of vulva and vagina: Secondary | ICD-10-CM

## 2017-12-12 DIAGNOSIS — Z801 Family history of malignant neoplasm of trachea, bronchus and lung: Secondary | ICD-10-CM | POA: Insufficient documentation

## 2017-12-12 MED ORDER — TERCONAZOLE 0.4 % VA CREA: 1.0000 | TOPICAL_CREAM | Freq: Every day | VAGINAL | 0 refills | Status: DC

## 2017-12-12 MED ORDER — TRIAMCINOLONE ACETONIDE 0.1 % EX CREA: 1.0000 "application " | TOPICAL_CREAM | Freq: Two times a day (BID) | CUTANEOUS | 0 refills | Status: DC

## 2017-12-12 NOTE — Discharge Instructions (Signed)

## 2017-12-12 NOTE — MAU Note (Signed)
Patient states she hyperextended her leg at work tonight.  Now feeling a pulling sensation in her lower abdomen and some cramping in her butt.  No bleeding or discharge.  Patient states that she has had some vaginal itching she is concerned about since her last wet prep done in MAU 3 days ago.

## 2017-12-12 NOTE — MAU Provider Note (Addendum)
History     CSN: [id number]  Arrival date and time: 12/12/17 4098   First Provider Initiated Contact with Patient 12/12/17 [phone]      Chief Complaint  Patient presents with  . Abdominal Pain  . Vaginal Itching   HPI Traci Peterson is a 32 y.o. G3P0200 at 34w4dwho presents with vaginal itching and irritation & abdominal pain.  While at work tonight she says she lifted up her leg and feels like she pulled something in her abdomen. Initially had severe pain throughout her abdomen that felt like a "gas bubble". Since arriving to MAU she states the pain has calmed down. Denies vaginal bleeding. No n/v/d or urinary complaints. Reports vaginal discharge, itching, and irritation x 3 days. Used 1 day monistat treatment without relief. Also used a friend's nystatin powder without relief. States she knows she has a yeast infection.   OB History    Gravida  3   Para  2   Term  0   Preterm  2   AB  0   Living  0     SAB  0   TAB  0   Ectopic  0   Multiple  1   Live Births  2           Past Medical History:  Diagnosis Date  . Anxiety    no current med.  . Cervix prolapsed into vagina   . Chronic headaches   . Diabetes mellitus without complication ([place]   . Gall stones   . HPV in female   . Jaw snapping    states jaw pops if opens mouth too wide  . Ovarian cyst   . PCOS (polycystic ovarian syndrome)   . Pilonidal cyst 02/2013  . Preterm labor 2016   PPROM @ 18 wks, delivery of IUFD @ 21 wks  . Vaginal Pap smear, abnormal     Past Surgical History:  Procedure Laterality Date  . CHOLECYSTECTOMY N/A 07/21/2016   Procedure: LAPAROSCOPIC CHOLECYSTECTOMY;  Surgeon: DCoralie Keens MD;  [place] [facility]  Service: General;  Laterality: N/A;  . PILONIDAL CYST EXCISION N/A 03/12/2013   Procedure: CYST EXCISION PILONIDAL EXTENSIVE;  Surgeon: DHarl Bowie MD;  [place] [facility]  Service: General;  Laterality: N/A;  . WISDOM TOOTH EXTRACTION       Family History  Problem Relation Age of Onset  . Hypertension Mother   . Hyperlipidemia Mother   . Diabetes Mother   . Thyroid disease Mother   . Diabetes Father   . Hypertension Father   . Alcohol abuse Father   . Breast cancer Maternal Aunt   . Lung cancer Paternal Grandfather     Social History   Tobacco Use  . Smoking status: Current Some Day Smoker    Packs/day: 0.25    Types: Cigarettes  . Smokeless tobacco: Never Used  . Tobacco comment: 1 every other day  Substance Use Topics  . Alcohol use: No    Frequency: Never    Comment: socially  . Drug use: No    Allergies:  Allergies  Allergen Reactions  . Lactose Intolerance (Gi) Diarrhea    Medications Prior to Admission  Medication Sig Dispense Refill Last Dose  . ACCU-CHEK FASTCLIX LANCETS MISC 1 Units by Percutaneous route 4 (four) times daily. 100 each 12   . Blood Glucose Monitoring Suppl (ACCU-CHEK NANO SMARTVIEW) w/Device KIT 1 kit by Subdermal route as directed. Check blood sugars for fasting, and two  hours after breakfast, lunch and dinner (4 checks daily) 1 kit 0   . cyclobenzaprine (FLEXERIL) 10 MG tablet Take 1 tablet (10 mg total) by mouth 3 (three) times daily as needed for muscle spasms. 20 tablet 0   . glucose blood (ACCU-CHEK SMARTVIEW) test strip Use as instructed to check blood sugars 100 each 12   . metFORMIN (GLUCOPHAGE) 500 MG tablet Take 1 tablet (500 mg total) by mouth 2 (two) times daily with a meal. 60 tablet 1   . Multiple Vitamin (MULTIVITAMIN) LIQD Take 30 mLs by mouth daily.   Past Week at Unknown time  . terbinafine (LAMISIL) 250 MG tablet Take 1 tablet (250 mg total) by mouth daily. 14 tablet 0     Review of Systems  Constitutional: Negative.   Gastrointestinal: Positive for abdominal pain. Negative for constipation, diarrhea, nausea and vomiting.  Genitourinary: Positive for vaginal discharge. Negative for dysuria, genital sores and vaginal bleeding.   Physical Exam   Blood  pressure 107/70, pulse 90, temperature 98.5 F (36.9 C), resp. rate 17, last menstrual period 10/27/2017.  Physical Exam  Nursing note and vitals reviewed. Constitutional: She is oriented to person, place, and time. She appears well-developed and well-nourished. No distress.  HENT:  Head: Normocephalic and atraumatic.  Eyes: Conjunctivae are normal. Right eye exhibits no discharge. Left eye exhibits no discharge. No scleral icterus.  Neck: Normal range of motion.  Respiratory: Effort normal. No respiratory distress.  GI: Soft. She exhibits no distension. There is no tenderness. There is no rebound and no guarding.  Neurological: She is alert and oriented to person, place, and time.  Skin: Skin is warm and dry. She is not diaphoretic.  Psychiatric: She has a normal mood and affect. Her behavior is normal. Judgment and thought content normal.    MAU Course  Procedures No results found for this or any previous visit (from the past 24 hour(s)).  MDM Had IUP confirmed on ultrasound on 7/15  Patient most recently used yeast medication just prior to arrival. Had negative wet prep on 7/15. Will defer wet prep tonight as she just used vaginal medication.   Assessment and Plan  A: 1. Vaginal yeast infection   2. Less than [redacted] weeks gestation of pregnancy    P: Discharge home Rx terazol 7 & triamcinolone cream to use externally F/u with ob/gyn Discussed reasons to return to [place] 12/12/2017, 2:12 AM

## 2017-12-15 ENCOUNTER — Encounter (HOSPITAL_COMMUNITY): Payer: Self-pay | Admitting: *Deleted

## 2017-12-15 ENCOUNTER — Inpatient Hospital Stay (HOSPITAL_COMMUNITY)
Admission: AD | Admit: 2017-12-15 | Discharge: 2017-12-15 | Disposition: A | Discharge status: Home / Self Care | Payer: 59 | Source: Ambulatory Visit | Attending: Obstetrics and Gynecology | Admitting: Obstetrics and Gynecology

## 2017-12-15 DIAGNOSIS — O99331 Smoking (tobacco) complicating pregnancy, first trimester: Secondary | ICD-10-CM | POA: Diagnosis not present

## 2017-12-15 DIAGNOSIS — O99611 Diseases of the digestive system complicating pregnancy, first trimester: Secondary | ICD-10-CM

## 2017-12-15 DIAGNOSIS — Z3A01 Less than 8 weeks gestation of pregnancy: Secondary | ICD-10-CM | POA: Diagnosis not present

## 2017-12-15 DIAGNOSIS — K5900 Constipation, unspecified: Secondary | ICD-10-CM

## 2017-12-15 DIAGNOSIS — O2341 Unspecified infection of urinary tract in pregnancy, first trimester: Secondary | ICD-10-CM | POA: Insufficient documentation

## 2017-12-15 DIAGNOSIS — F17210 Nicotine dependence, cigarettes, uncomplicated: Secondary | ICD-10-CM | POA: Diagnosis not present

## 2017-12-15 DIAGNOSIS — E119 Type 2 diabetes mellitus without complications: Secondary | ICD-10-CM

## 2017-12-15 DIAGNOSIS — O26891 Other specified pregnancy related conditions, first trimester: Secondary | ICD-10-CM | POA: Insufficient documentation

## 2017-12-15 DIAGNOSIS — O2340 Unspecified infection of urinary tract in pregnancy, unspecified trimester: Secondary | ICD-10-CM

## 2017-12-15 DIAGNOSIS — R198 Other specified symptoms and signs involving the digestive system and abdomen: Secondary | ICD-10-CM

## 2017-12-15 LAB — URINALYSIS, ROUTINE W REFLEX MICROSCOPIC
BILIRUBIN URINE: NEGATIVE
GLUCOSE, UA: 50 mg/dL — AB
Ketones, ur: NEGATIVE mg/dL
NITRITE: NEGATIVE
PROTEIN: 30 mg/dL — AB
Specific Gravity, Urine: 1.026 (ref 1.005–1.030)
pH: 6 (ref 5.0–8.0)

## 2017-12-15 MED ORDER — CEPHALEXIN 500 MG PO CAPS: 500.0000 mg | ORAL_CAPSULE | Freq: Four times a day (QID) | ORAL | 0 refills | Status: AC

## 2017-12-15 MED ORDER — POLYETHYLENE GLYCOL 3350 17 G PO PACK: 17.0000 g | PACK | Freq: Every day | ORAL | 0 refills | Status: DC

## 2017-12-15 MED ORDER — DOCUSATE SODIUM 100 MG PO CAPS: 100.0000 mg | ORAL_CAPSULE | Freq: Two times a day (BID) | ORAL | 0 refills | Status: DC

## 2017-12-15 NOTE — MAU Note (Addendum)
Traci Peterson is a 32 y.o. at 5964w0d here in MAU reporting:  +constipation and abdominal pain Intermittent pressure, burning, and cramping Last BM reports yesterday am and was loose (she endorses this is typical since being on metformin) Endorses having strained the last 3 hours to have a BM Endorses having tried glycerin suppository with no relief States read that it was not good to try enemas so she did not. Also has not tried stool softeners. Onset of complaint: started today Pain score: 8/10 +nausea Vitals:   12/15/17 1133  BP: 100/61  Pulse: 96  Resp: 18  Temp: 98.5 F (36.9 C)  SpO2: 96%      Lab orders placed from triage: ua (patient reports to is unable to void at this time)  Patient had previous visits to mau this past week: 7/15 for vaginal bleeding and 7/18 for abdominal pain and vaginal itching

## 2017-12-15 NOTE — MAU Provider Note (Addendum)
History     CSN: [id number]  Arrival date and time: 12/15/17 1121   First Provider Initiated Contact with Patient 12/15/17 1148      Chief Complaint  Patient presents with  . Constipation   HPI  Traci Peterson is a 32 y.o. G3P0200 at 44w0dwho presents to MAU with chief complete of severe constipation. Patient reports she has only had one runny stool in the past 24 hours and has concerns that her stool is impacted due to previous experiences with this problem. Denies vaginal bleeding, leaking of fluid, fever, falls, or recent illness.   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 Verbalizes concern that she feels as if she is "ready to have a huge" bowel movement and is concerned that she won't be able to make it to her restroom at home.  Reports adequate PO hydration with flavored water. States she is aware of goal diabetic diet but is "still getting the food part together". Reports she had a hamburger and fried from [employer] for dinner last night.   Pregnancy is complicated by TZ7BV PCOS (polycystic ovarian syndrome); HPV in female; Anxiety and depression; Bacterial vaginitis; Morbidly obese ([place]; Eczema; 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 -- orange, yellow 6 and red food colorings - moderate to severe respiratory distress; Chronic headaches -  no meds; Tinea pedis, recurrent; Tinea pedis of both feet; Family history of breast cancer in female; Gall stones; Incompetent cervix; MDD (major depressive disorder), recurrent episode, severe ([place]; Constipation; Acute maxillary sinusitis; Vagina bleeding; and Vitamin D deficiency.  Patient Active Problem List   Diagnosis Date Noted  . Vitamin D deficiency 12/10/2017  . Vagina bleeding 08/04/2017  . Acute maxillary sinusitis 06/17/2017  . Constipation 05/30/2017  . MDD (major depressive disorder), recurrent episode, severe ([place] 05/29/2017  .  Incompetent cervix 09/12/2016  . Gall stones 07/19/2016  . Family history of breast cancer in female 06/06/2016  . Tinea pedis, recurrent 08/08/2015  . Tinea pedis of both feet 08/08/2015  . Bacterial vaginitis 10/25/2014  . Morbidly obese ([place] 10/25/2014  . Eczema 10/25/2014  . 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 Chronic headaches -  no meds 10/25/2014  . Anxiety and depression 11/30/2013  . HPV in female 05/08/2013  . PCOS (polycystic ovarian syndrome) 04/20/2013   OB History    Gravida  3   Para  2   Term  0   Preterm  2   AB  0   Living  0     SAB  0   TAB  0   Ectopic  0   Multiple  1   Live Births  2           Past Medical History:  Diagnosis Date  . Anxiety    no current med.  . Cervix prolapsed into vagina   . Chronic headaches   . Diabetes mellitus without complication ([place]   . Gall stones   . HPV in female   . Jaw snapping    states jaw pops if opens mouth too wide  . Ovarian cyst   . PCOS (polycystic ovarian syndrome)   . Pilonidal cyst 02/2013  . Preterm labor 2016   PPROM @ 18 wks, delivery of IUFD @ 21 wks  . Vaginal Pap smear, abnormal  Past Surgical History:  Procedure Laterality Date  . CHOLECYSTECTOMY N/A 07/21/2016   Procedure: LAPAROSCOPIC CHOLECYSTECTOMY;  Surgeon: Coralie Keens, MD;  Location: [facility];  Service: General;  Laterality: N/A;  . PILONIDAL CYST EXCISION N/A 03/12/2013   Procedure: CYST EXCISION PILONIDAL EXTENSIVE;  Surgeon: Harl Bowie, MD;  Location: [facility];  Service: General;  Laterality: N/A;  . WISDOM TOOTH EXTRACTION      Family History  Problem Relation Age of Onset  . Hypertension Mother   . Hyperlipidemia Mother   . Diabetes Mother   . Thyroid disease Mother   . Diabetes Father   . Hypertension Father   . Alcohol abuse Father   . Breast cancer Maternal Aunt   . Lung cancer Paternal  Grandfather     Social History   Tobacco Use  . Smoking status: Current Some Day Smoker    Packs/day: 0.25    Types: Cigarettes  . Smokeless tobacco: Never Used  . Tobacco comment: 1 every other day  Substance Use Topics  . Alcohol use: No    Frequency: Never    Comment: socially  . Drug use: No    Allergies:  Allergies  Allergen Reactions  . Lactose Intolerance (Gi) Diarrhea    Medications Prior to Admission  Medication Sig Dispense Refill Last Dose  . ACCU-CHEK FASTCLIX LANCETS MISC 1 Units by Percutaneous route 4 (four) times daily. 100 each 12   . Blood Glucose Monitoring Suppl (ACCU-CHEK NANO SMARTVIEW) w/Device KIT 1 kit by Subdermal route as directed. Check blood sugars for fasting, and two hours after breakfast, lunch and dinner (4 checks daily) 1 kit 0   . cyclobenzaprine (FLEXERIL) 10 MG tablet Take 1 tablet (10 mg total) by mouth 3 (three) times daily as needed for muscle spasms. 20 tablet 0   . glucose blood (ACCU-CHEK SMARTVIEW) test strip Use as instructed to check blood sugars 100 each 12   . metFORMIN (GLUCOPHAGE) 500 MG tablet Take 1 tablet (500 mg total) by mouth 2 (two) times daily with a meal. 60 tablet 1   . Multiple Vitamin (MULTIVITAMIN) LIQD Take 30 mLs by mouth daily.   Past Week at Unknown time  . terbinafine (LAMISIL) 250 MG tablet Take 1 tablet (250 mg total) by mouth daily. 14 tablet 0   . terconazole (TERAZOL 7) 0.4 % vaginal cream Place 1 applicator vaginally at bedtime. Use for seven days 45 g 0   . triamcinolone cream (KENALOG) 0.1 % Apply 1 application topically 2 (two) times daily. 30 g 0     Review of Systems  Constitutional: Negative for fever.  Gastrointestinal: Positive for abdominal pain and constipation. Negative for diarrhea, nausea, rectal pain and vomiting.       LLQ abdominal discomfort "pressure"  Endocrine: Negative for polydipsia, polyphagia and polyuria.  Genitourinary: Negative for difficulty urinating, dyspareunia, dysuria,  vaginal bleeding, vaginal discharge and vaginal pain.  Neurological: Negative for dizziness and headaches.  All other systems reviewed and are negative.  Physical Exam   Blood pressure 100/61, pulse 96, temperature 98.5 F (36.9 C), temperature source Oral, resp. rate 18, weight 271 lb (122.9 kg), last menstrual period 10/27/2017, SpO2 96 %.  Physical Exam  Nursing note and vitals reviewed. Constitutional: She is oriented to person, place, and time. She appears well-developed and well-nourished.  Cardiovascular: Normal rate, regular rhythm, normal heart sounds and intact distal pulses.  Respiratory: Effort normal and breath sounds normal. No respiratory distress. She has no wheezes.  GI: Soft. Bowel sounds are normal. She exhibits no distension and no mass. There is tenderness. There is no rebound and no guarding.  Normoactive bowel sounds in all four quadrants Mild LLQ tenderness to deep palpation  Musculoskeletal: Normal range of motion.  Neurological: She is alert and oriented to person, place, and time. She has normal reflexes.  Skin: Skin is warm and dry.  Psychiatric: She has a normal mood and affect. Her behavior is normal. Judgment and thought content normal.    MAU Course  Procedures  MDM Vital signs stable Constipation related to first trimester, low physical activity, recent diet heavy in fat, low in fiber Work involves walking throughout shift but no physical activity outside work  Patient Vitals for the past 24 hrs:  BP Temp Temp src Pulse Resp SpO2 Weight  12/15/17 1133 100/61 98.5 F (36.9 C) Oral 96 18 96 % 271 lb (122.9 kg)    Results for orders placed or performed during the hospital encounter of 12/15/17 (from the past 24 hour(s))  Urinalysis, Routine w reflex microscopic     Status: Abnormal   Collection Time: 12/15/17 12:25 PM  Result Value Ref Range   Color, Urine YELLOW YELLOW   APPearance HAZY (A) CLEAR   Specific Gravity, Urine 1.026 1.005 - 1.030    pH 6.0 5.0 - 8.0   Glucose, UA 50 (A) NEGATIVE mg/dL   Hgb urine dipstick SMALL (A) NEGATIVE   Bilirubin Urine NEGATIVE NEGATIVE   Ketones, ur NEGATIVE NEGATIVE mg/dL   Protein, ur 30 (A) NEGATIVE mg/dL   Nitrite NEGATIVE NEGATIVE   Leukocytes, UA MODERATE (A) NEGATIVE   RBC / HPF 21-50 0 - 5 RBC/hpf   WBC, UA 21-50 0 - 5 WBC/hpf   Bacteria, UA RARE (A) NONE SEEN   Squamous Epithelial / LPF 0-5 0 - 5   Mucus PRESENT    Meds ordered this encounter  Medications  . polyethylene glycol (MIRALAX / GLYCOLAX) packet    Sig: Take 17 g by mouth daily.    Dispense:  14 each    Refill:  0    Order Specific Question:   Supervising Provider    Answer:   Donnamae Jude [[id number]]  . docusate sodium (COLACE) 100 MG capsule    Sig: Take 1 capsule (100 mg total) by mouth every 12 (twelve) hours.    Dispense:  60 capsule    Refill:  0    Order Specific Question:   Supervising Provider    Answer:   Donnamae Jude [[id number]]  . cephALEXin (KEFLEX) 500 MG capsule    Sig: Take 1 capsule (500 mg total) by mouth 4 (four) times daily for 10 days.    Dispense:  40 capsule    Refill:  0    Order Specific Question:   Supervising Provider    Answer:   Donnamae Jude [[id number]]   Assessment and Plan  --33 y.o. G3P0200 at [redacted]w[redacted]d --Constipation in pregnancy, UTI in pregnancy, rx sent to preferred pharmacy, urine culture ordered --Discussed diet revision, diabetes diet with focus on protein, fiber, PO hydration with water, minimize     caffeine and saturated fats --Miralax q day and docusate BID as ordered --Reviewed possible manual disimpaction at home PRN --Reviewed general obstetric precautions including but not limited to falls, fever, vaginal bleeding, leaking of    fluid,  headache not relieved by Tylenol, rest and PO hydration.  --Discharge home in stable condition  SDarlina Rumpf CNM  12/15/2017, 1:23 PM

## 2017-12-15 NOTE — Discharge Instructions (Signed)

## 2017-12-16 DIAGNOSIS — O24119 Pre-existing diabetes mellitus, type 2, in pregnancy, unspecified trimester: Secondary | ICD-10-CM | POA: Insufficient documentation

## 2017-12-16 LAB — CULTURE, OB URINE: Culture: <10000 — AB

## 2017-12-31 ENCOUNTER — Encounter: Payer: Self-pay | Admitting: Obstetrics & Gynecology

## 2017-12-31 NOTE — Progress Notes (Signed)
Last pap 1/18 Normal but HPV

## 2018-01-01 ENCOUNTER — Ambulatory Visit (INDEPENDENT_AMBULATORY_CARE_PROVIDER_SITE_OTHER): Payer: 59 | Admitting: Obstetrics & Gynecology

## 2018-01-01 ENCOUNTER — Encounter: Payer: Self-pay | Admitting: *Deleted

## 2018-01-01 ENCOUNTER — Encounter: Payer: Self-pay | Admitting: Obstetrics & Gynecology

## 2018-01-01 ENCOUNTER — Other Ambulatory Visit (HOSPITAL_COMMUNITY)
Admission: RE | Admit: 2018-01-01 | Discharge: 2018-01-01 | Disposition: A | Discharge status: Home / Self Care | Payer: 59 | Source: Ambulatory Visit | Attending: Obstetrics & Gynecology | Admitting: Obstetrics & Gynecology

## 2018-01-01 VITALS — BP 96/64 | HR 108 | Wt 274.0 lb

## 2018-01-01 DIAGNOSIS — Z3A09 9 weeks gestation of pregnancy: Secondary | ICD-10-CM | POA: Insufficient documentation

## 2018-01-01 DIAGNOSIS — O0991 Supervision of high risk pregnancy, unspecified, first trimester: Secondary | ICD-10-CM | POA: Diagnosis present

## 2018-01-01 DIAGNOSIS — Z3687 Encounter for antenatal screening for uncertain dates: Secondary | ICD-10-CM

## 2018-01-01 DIAGNOSIS — R7989 Other specified abnormal findings of blood chemistry: Secondary | ICD-10-CM

## 2018-01-01 DIAGNOSIS — O0990 Supervision of high risk pregnancy, unspecified, unspecified trimester: Secondary | ICD-10-CM | POA: Insufficient documentation

## 2018-01-01 LAB — GLUCOSE, POCT (MANUAL RESULT ENTRY): POC GLUCOSE: 88 mg/dL (ref 70–99)

## 2018-01-01 NOTE — Progress Notes (Signed)
Single IUP with FHT of 162 BPM and CRL is 28.647mm  GA 6554w5d

## 2018-01-01 NOTE — Progress Notes (Signed)
Subjective:    Traci Peterson is a G62P0200 49w3dbeing seen today for her first obstetrical visit.  Her obstetrical history is significant for non-compliance, obesity and history of preterm birth x2. Patient does intend to breast feed. Pregnancy history fully reviewed.  Patient reports no complaints.  Vitals:   01/01/18 0903  BP: 96/64  Pulse: (!) 108  Weight: 274 lb (124.3 kg)    HISTORY: OB History  Gravida Para Term Preterm AB Living  3 2 0 2 0 0  SAB TAB Ectopic Multiple Live Births  0 0 0 1 2    # Outcome Date GA Lbr Len/2nd Weight Sex Delivery Anes PTL Lv  3 Current           2A Preterm 08/27/16 251w1d2:16 / 00:15 10.4 oz (0.295 kg) F Vag-Spont None  ND  2B Preterm 08/30/16 2132w4d:16 / 66:15 13.8 oz (0.391 kg) M Vag-Spont None  ND  1 Preterm 11/12/14 21w15w1d.2 oz (0.374 kg) M Vag-Spont None  FD   Past Medical History:  Diagnosis Date  . Anxiety    no current med.  . Cervix prolapsed into vagina   . Chronic headaches   . Diabetes mellitus without complication (HCC)[place]. Gall stones   . HPV in female   . Jaw snapping    states jaw pops if opens mouth too wide  . Ovarian cyst   . PCOS (polycystic ovarian syndrome)   . Pilonidal cyst 02/2013  . Preterm labor 2016   PPROM @ 18 wks, delivery of IUFD @ 21 wks  . Vaginal Pap smear, abnormal    Past Surgical History:  Procedure Laterality Date  . CHOLECYSTECTOMY N/A 07/21/2016   Procedure: LAPAROSCOPIC CHOLECYSTECTOMY;  Surgeon: DougCoralie Keens;  [facility] [facility] [place];  Laterality: N/A;  . PILONIDAL CYST EXCISION N/A 03/12/2013   Procedure: CYST EXCISION PILONIDAL EXTENSIVE;  Surgeon: DougHarl Bowie;  [place] [facility] [facility];  Laterality: N/A;  . WISDOM TOOTH EXTRACTION     Family History  Problem Relation Age of Onset  . Hypertension Mother   . Hyperlipidemia Mother   . Diabetes Mother   . Thyroid disease Mother   . Diabetes Father   .  Hypertension Father   . Alcohol abuse Father   . Breast cancer Maternal Aunt   . Lung cancer Paternal Grandfather      Exam    Uterus:     Pelvic Exam:    Perineum: No Hemorrhoids   Vulva: normal   Vagina:  normal mucosa   pH: n/a   Cervix: no lesions   Adnexa: not evaluated   Bony Pelvis: average  System: Breast:  normal appearance, no masses or tenderness   Skin: normal coloration and turgor, no rashes; fungus on feet--chronic   Neurologic: oriented, normal mood   Extremities: no deformities   HEENT sclera clear, anicteric, oropharynx clear, no lesions, neck supple with midline trachea, thyroid without masses and trachea midline   Mouth/Teeth mucous membranes moist, pharynx normal without lesions and dental hygiene good   Neck supple and no masses   Cardiovascular: regular rate and rhythm   Respiratory:  appears well, vitals normal, no respiratory distress, acyanotic, normal RR, chest clear, no wheezing, crepitations, rhonchi, normal symmetric air entry   Abdomen: soft, non-tender; bowel sounds normal; no masses,  no organomegaly   Urinary: urethral meatus normal      Assessment:    Pregnancy:  [id number] Patient Active Problem List   Diagnosis Date Noted  . Pre-existing type 2 diabetes mellitus during pregnancy, antepartum 12/16/2017  . Type 2 diabetes mellitus ([place]) 12/15/2017  . Vitamin D deficiency 12/10/2017  . Vagina bleeding 08/04/2017  . Acute maxillary sinusitis 06/17/2017  . Constipation 05/30/2017  . MDD (major depressive disorder), recurrent episode, severe ([place]) 05/29/2017  . Incompetent cervix 09/12/2016  . Gall stones 07/19/2016  . Family history of breast cancer in female 06/06/2016  . Tinea pedis, recurrent 08/08/2015  . Tinea pedis of both feet 08/08/2015  . Bacterial vaginitis 10/25/2014  . Morbidly obese ([place]) 10/25/2014  . Eczema 10/25/2014  . 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 -- orange, yellow 6 and red food colorings - moderate to severe respiratory  distress 10/25/2014  . Chronic headaches -  no meds 10/25/2014  . Anxiety and depression 11/30/2013  . HPV in female 05/08/2013  . PCOS (polycystic ovarian syndrome) 04/20/2013        Plan:     1.  History of 21 week loss x2 And has had 2 losses at 21 weeks.  Her cervix is not dilated when she ruptured her membranes with first pregnancy (cervix 3.4 cm).  She is never had a cervical cerclage.  Patient is interested in getting a cerclage.  I am referring her to MFM for record review and recommendations.  2.  Type 2 diabetes We will follow practice protocol for diabetes.  Patient has not been taking her blood sugar.  She also thinks that she is does not have type 2 diabetes but only has gestational diabetes.  We will do a Glucola tomorrow.  Patient has Artie met with the diabetes management and understands how to eat.  She has not follow the recommendations.  Understands risk of fetal birth defects and other maternal fetal adverse outcomes from uncontrolled diabetes.  3.  Vitamin D deficiency We will draw vitamin D today  4.  Patient needs dental care.  Encouraged to do daily brushing and flossing.  5.  Obesity in pregnancy She is aware of obesity and maternal weight gain in pregnancy can be associated with adverse outcomes.  Patient knows her goal is to only gain 12 pounds during this pregnancy.  This could be easily achieved with proper nutrition.    6.  Daily.  Prenatal vitamins and patient having routine prenatal labs drawn.   Silas [place] 01/01/2018

## 2018-01-02 ENCOUNTER — Encounter (HOSPITAL_COMMUNITY): Payer: Self-pay

## 2018-01-02 LAB — CYTOLOGY - PAP
Chlamydia: NEGATIVE
DIAGNOSIS: NEGATIVE
NEISSERIA GONORRHEA: NEGATIVE

## 2018-01-03 ENCOUNTER — Telehealth: Payer: Self-pay

## 2018-01-03 LAB — OBSTETRIC PANEL
ANTIBODY SCREEN: NOT DETECTED
BASOS PCT: 0.3 %
Basophils Absolute: 31 cells/uL (ref 0–200)
EOS PCT: 1.4 %
Eosinophils Absolute: 146 cells/uL (ref 15–500)
HEMATOCRIT: 42.5 % (ref 35.0–45.0)
Hemoglobin: 14.6 g/dL (ref 11.7–15.5)
Hepatitis B Surface Ag: NONREACTIVE
Lymphs Abs: 2475 cells/uL (ref 850–3900)
MCH: 30.2 pg (ref 27.0–33.0)
MCHC: 34.4 g/dL (ref 32.0–36.0)
MCV: 87.8 fL (ref 80.0–100.0)
MONOS PCT: 7.3 %
MPV: 11.4 fL (ref 7.5–12.5)
NEUTROS ABS: 6989 {cells}/uL (ref 1500–7800)
Neutrophils Relative %: 67.2 %
Platelets: 239 10*3/uL (ref 140–400)
RBC: 4.84 10*6/uL (ref 3.80–5.10)
RDW: 14.5 % (ref 11.0–15.0)
RPR Ser Ql: NONREACTIVE
Rubella: 3.02 index
Total Lymphocyte: 23.8 %
WBC mixed population: 759 cells/uL (ref 200–950)
WBC: 10.4 10*3/uL (ref 3.8–10.8)

## 2018-01-03 LAB — 2HR GTT W 1 HR, CARPENTER, 75 G
GLUCOSE, 1 HR, GEST: 163 mg/dL (ref 65–179)
GLUCOSE, 2 HR, GEST: 177 mg/dL — AB (ref 65–152)
Glucose, Fasting, Gest: 107 mg/dL — ABNORMAL HIGH (ref 65–91)

## 2018-01-03 LAB — VITAMIN D 25 HYDROXY (VIT D DEFICIENCY, FRACTURES): VIT D 25 HYDROXY: 14 ng/mL — AB (ref 30–100)

## 2018-01-03 LAB — HEMOGLOBIN A1C
Hgb A1c MFr Bld: 6.2 % of total Hgb — ABNORMAL HIGH (ref <5.7)
MEAN PLASMA GLUCOSE: 131 (calc)
eAG (mmol/L): 7.3 (calc)

## 2018-01-03 LAB — HIV ANTIBODY (ROUTINE TESTING W REFLEX): HIV 1&2 Ab, 4th Generation: NONREACTIVE

## 2018-01-03 NOTE — Telephone Encounter (Signed)
Called pt to let her know she did not pass her 2 hour GTT. Pt did not answer and mailbox was full. Supplies and referral had already been sent by Judeth HornErin Lawrence, NP.

## 2018-01-06 ENCOUNTER — Encounter (HOSPITAL_COMMUNITY): Payer: Self-pay

## 2018-01-06 ENCOUNTER — Ambulatory Visit (HOSPITAL_COMMUNITY)
Admission: RE | Admit: 2018-01-06 | Discharge: 2018-01-06 | Disposition: A | Discharge status: Home / Self Care | Payer: 59 | Source: Ambulatory Visit | Attending: Obstetrics & Gynecology | Admitting: Obstetrics & Gynecology

## 2018-01-06 DIAGNOSIS — O0991 Supervision of high risk pregnancy, unspecified, first trimester: Secondary | ICD-10-CM | POA: Diagnosis not present

## 2018-01-06 DIAGNOSIS — O2621 Pregnancy care for patient with recurrent pregnancy loss, first trimester: Secondary | ICD-10-CM

## 2018-01-06 DIAGNOSIS — O24415 Gestational diabetes mellitus in pregnancy, controlled by oral hypoglycemic drugs: Secondary | ICD-10-CM | POA: Diagnosis not present

## 2018-01-06 DIAGNOSIS — Z3A10 10 weeks gestation of pregnancy: Secondary | ICD-10-CM | POA: Insufficient documentation

## 2018-01-06 DIAGNOSIS — O99331 Smoking (tobacco) complicating pregnancy, first trimester: Secondary | ICD-10-CM

## 2018-01-06 NOTE — Consult Note (Signed)
CONSULT NOTE      MATERNAL FETAL MEDICINE CONSULT  Patient Name: Traci Peterson  Medical Record Number: [id number] Date of Birth: 19-Jan-1986  Requesting Physician Name: Elsie LincolnKelly Leggett, M.D. Date of Service: 01/06/18  I had the pleasure of seeing [facility]. [facility]  He is G3 P0-0-2-0 at [redacted] weeks gestation with a history of 2 midtrimester pregnancy losses.  She is here for consultation.  OB History: In the first pregnancy in 2016 at [redacted] weeks gestation she had leakage of amniotic fluid (PPROM).  [facility] where rupture of membranes was confirmed.  Ultrasound showed a cervical length of 2.5 cm and there was oligohydramnios.  Patient reports she was discharged home 21-weeks she was seen twice [facility] on on both these occasions her cervix was closed.  A 21-weeks gestation, she delivered a stillborn female fetus at home.  The fetus weighed 13 ounces.  Placenta weight 169 g and showed evidence of acute chorioamnionitis.  The miscarriage the patient did not have fever.  In her second pregnancy, in 2018, she conceived dichorionic twins.  Patient took 3817 [place] progesterone prophylaxis.  She had PPROM at [redacted] weeks gestation.  Ultrasound performed by MFM showed anhydramnios and twin a normal amniotic fluid in twin B.Patient was discharged and a 21 weeks she delivered a female infant (08/27/2016) and 3 days later delivered a female infant (twin B on 08/30/16).  Unfortunately, both babies did not survive. Placental examination showed evidence of acute chorioamnionitis.  Twin A's placenta weight of 177 g and twin B's placenta weighed 160 g.  GYN history: History of abnormal Pap smears.  Patient reports she has PCOS.  Her menstrual cycles were irregular.  Medical history: She has early diagnosis of gestational diabetes.  Diagnosis was made on the basis of abnormal GTT.  Recent hemoglobin A1c is 6.2%.  She had a consultation with the diabetic  educator and she is yet to start self-monitoring of her blood glucose regularly. She does not have hypertension or sickle cell trait or any other chronic medical conditions.  Surgical history: Cholecystectomy, pilonidal cyst removal.  Medications: Prenatal vitamins, metformin 500 mg twice daily.  Allergies: No known drug allergies.  Social: She smokes 3 cigarettes daily.  Denies use of alcohol or drugs.  Partner is an African-American and he is in good health.  Family: Has diabetes, hypertension and hypothyroidism.  Father she reports has problems of drug addiction.  Paternal grandfather died of pancreatic cancer.  No history of venous thromboembolism in the family.     I counseled the patient on the following: History of midtrimester pregnancy losses: Patient is aware of the possible diagnosis of cervical incompetence.  Although her history shows no classical symptoms of cervical incompetence, PPROM is one of the symptoms of cervical incompetence.  Her second pregnancy was complicated by twins and it post challenges to the diagnosis of cervical incompetence.  Finding of acute chorioamnionitis on placental examination cannot be a sign of infection preceding miscarriage.    I counseled the patient on the possible diagnosis of cervical incompetence and discussed the following options: 1) Weekly cervical length measurements from [redacted] weeks gestation till 23 weeks and perform rescue cerclage if cervical shortening less than 2.5 cm is noted, or 2) prophylactic cerclage at 13 to [redacted] weeks gestation.  Patient understands that based on her history the diagnosis of cervical incompetence cannot be conclusively proven.  She is very firm in her decision to  undergo prophylactic cerclage.  I explained the procedure and possible complications including miscarriage, bleeding, infection and injuries to bladder or bowel (very rare). I also informed her that cerclage does not guarantee carrying pregnancy to  term.  The benefit of progesterone prophylaxis in women with history of pregnancy losses before 20 weeks is not proven (some insurance companies may not approve).  Patient had taken progesterone prophylaxis in a previous pregnancy and would like to start in this pregnancy.  Gestational diabetes: -Usually resolves after pregnancy. In about 25% to 40% of cases, type 2 diabetes can develop and we recommend postpartum screening with 75-g glucose at 6 to 12 weeks after delivery. -I discussed normal blood glucose parameters. -Discussed the importance of diet, exercise and medications.  -I counseled the patient on the possible complications including macrosomia, birth injuries, neonatal complications (respiratory-distress syndrome, hypoglycemia, metabolic disturbances) and stillbirth (in poorly-controlled diabetes). -Her blood pressures have been normal at prenatal visits. -I discussed ultrasound protocol and antenatal testing. -We will address timing of delivery during the following weeks (depending on her diabetic control).  I discussed the benefit of low-dose aspirin prophylaxis to be taken from 12 to 13 weeks' gestation till delivery to delay or prevent development of preeclampsia. She does not have contraindications to aspirin. Her risk factors include obesity, African-American race and diabetes (early diagnosis and can be type 2).  Cigarette smoking: I briefly discussed nicotine-replacement therapy (NRT). Smoking cessation at any gestational age is beneficial and should routinely be addressed in her prenatal visits. Nicotine-replacement therapy (NRT) can be considered if nonpharmacological approaches have failed. Its chief benefit is that it does not contain other toxic substances present in cigarettes. Cigarettes, in addition to nicotine and Carbon monoxide, contain several other toxic substances.  Screening for fetal aneuploidies:  I discussed first-trimester screening, its significance and  limitations. I also informed her that only chorionic villus sampling (CVS) or amniocentesis will give a definitive result on the fetal karyotype. I also discussed cell-free fetal DNA screening, its significance and  limitations. If patient is willing, patches may be used.   Recommendations: -Prophylactic cerclage between 12 and 14 weeks' gestation. -First-trimester screening or cell-free fetal DNA screening. -Fetal anatomy scan at 20 weeks. -Serial fetal growth assessments from 24 weeks every 4 weeks till delivery. -Weekly antenatal testing from 32 weeks till delivery. -If diabetes is well-controlled, delivery should be considered at 39 weeks provided no other complications exist. -Smoking-cessation counseling and NRT to be considered.  Thank you for the consult.  If you have any questions or concerns please do not hesitate to contact me at the [facility].  Consultation including face-to-face counseling 40 minutes.

## 2018-01-07 ENCOUNTER — Telehealth: Payer: Self-pay | Admitting: *Deleted

## 2018-01-07 MED ORDER — METRONIDAZOLE 500 MG PO TABS: ORAL_TABLET | ORAL | 0 refills | Status: DC

## 2018-01-07 NOTE — Telephone Encounter (Signed)
-----   Message from Lesly DukesKelly H Leggett, MD sent at 01/07/2018  2:23 PM EDT ----- Vit D low, supplement with 2000U daily and recheck labs; + Trich--Rx with with Flagyl 2 gm with TOC in 3 weeks.  Type 2 DM-->Needs to report CBGs, follow diet, use baby Rx,

## 2018-01-08 ENCOUNTER — Encounter (HOSPITAL_COMMUNITY): Payer: Self-pay

## 2018-01-08 ENCOUNTER — Telehealth: Payer: Self-pay | Admitting: *Deleted

## 2018-01-08 NOTE — Telephone Encounter (Signed)
t notified of Positive Trich and RX for Flagyl was sent to her pharmacy.  She is aware that her partner will also need treatment.  She is also to get Vitamin d 2000IU to start daily.  She is to record her CBG's and bring her readings with her at each visit.

## 2018-01-08 NOTE — Addendum Note (Signed)
Addended by: Kathie DikeSOLA, Luciana Cammarata J on: 01/08/2018 09:53 AM   Modules accepted: Orders

## 2018-01-08 NOTE — Telephone Encounter (Signed)
-----   Message from Kelly H Leggett, MD sent at 01/07/2018  2:23 PM EDT ----- Vit D low, supplement with 2000U daily and recheck labs; + Trich--Rx with with Flagyl 2 gm with TOC in 3 weeks.  Type 2 DM-->Needs to report CBGs, follow diet, use baby Rx, 

## 2018-01-13 ENCOUNTER — Encounter (HOSPITAL_COMMUNITY): Payer: Self-pay | Admitting: *Deleted

## 2018-01-13 ENCOUNTER — Other Ambulatory Visit: Payer: Self-pay

## 2018-01-20 ENCOUNTER — Ambulatory Visit (INDEPENDENT_AMBULATORY_CARE_PROVIDER_SITE_OTHER): Payer: 59 | Admitting: Certified Nurse Midwife

## 2018-01-20 VITALS — BP 98/60 | HR 92 | Wt 278.0 lb

## 2018-01-20 DIAGNOSIS — O23599 Infection of other part of genital tract in pregnancy, unspecified trimester: Secondary | ICD-10-CM

## 2018-01-20 DIAGNOSIS — A5901 Trichomonal vulvovaginitis: Secondary | ICD-10-CM | POA: Insufficient documentation

## 2018-01-20 DIAGNOSIS — N883 Incompetence of cervix uteri: Secondary | ICD-10-CM

## 2018-01-20 DIAGNOSIS — O23591 Infection of other part of genital tract in pregnancy, first trimester: Secondary | ICD-10-CM

## 2018-01-20 DIAGNOSIS — O24119 Pre-existing diabetes mellitus, type 2, in pregnancy, unspecified trimester: Secondary | ICD-10-CM

## 2018-01-20 DIAGNOSIS — O0990 Supervision of high risk pregnancy, unspecified, unspecified trimester: Secondary | ICD-10-CM

## 2018-01-20 NOTE — Patient Instructions (Signed)
Cervical Cerclage Cervical cerclage is a surgical procedure to correct a cervix that opens up and thins out before pregnancy is at term (cervical insufficiency, also called incompetent cervix). This condition can cause labor to start early (prematurely). This procedure involves using stitches to sew the cervix shut during pregnancy. Your surgeon may use ultrasound equipment to help guide the procedure and monitor your baby. Ultrasound equipment uses sound waves to take images of your cervix and uterus. Your surgeon will assess these images on a monitor in the operating room. Tell a health care provider about:  Any allergies you have, especially any allergies related to prescribed medicine, stitches, or anesthetic medicines.  All medicines you are taking, including vitamins, herbs, eye drops, creams, and over-the-counter medicines. Bring a list of all of your medicines to your appointment.  Your medical history, including prior labor deliveries.  Any problems you or family members have had with anesthetic medicines.  Any blood disorders you have.  Any surgeries you have had, including prior cervical stitching.  Any medical conditions you have.  Whether you are pregnant or may be pregnant. What are the risks? Generally, this is a safe procedure. However, problems may occur, including:  Infection, such as infection of the cervix or amniotic sac.  Vaginal bleeding.  Allergic reactions to medicines.  Damage to other structures or organs, such as tearing (rupture) of membranes or cervical laceration.  Premature contractions including going into early labor and delivery.  Cervical dystocia, which occurs when the cervix is unable to dilate normally during labor.  What happens before the procedure? Staying hydrated Follow instructions from your health care provider about hydration, which may include:  Up to 2 hours before the procedure - you may continue to drink clear liquids, such as  water, clear fruit juice, black coffee, and plain tea.  Eating and drinking restrictions Follow instructions from your health care provider about eating and drinking, which may include:  8 hours before the procedure - stop eating heavy meals or foods such as meat, fried foods, or fatty foods.  6 hours before the procedure - stop eating light meals or foods, such as toast or cereal.  6 hours before the procedure - stop drinking milk or drinks that contain milk.  2 hours before the procedure - stop drinking clear liquids.  Medicines  Ask your health care provider about: ? Changing or stopping your regular medicines. This is especially important if you are taking diabetes medicines or blood thinners. ? Taking medicines such as aspirin and ibuprofen. These medicines can thin your blood. Do not take these medicines before your procedure if your health care provider instructs you not to.  You may be given antibiotic medicine to help prevent infection. General instructions  Do not put on any lotion, deodorant, or perfume.  Remove contact lenses and jewelry.  Ask your health care provider how your surgical site will be marked or identified.  You may have an exam or testing.  You may have a blood or urine sample taken.  Plan to have someone take you home from the hospital or clinic.  If you will be going home right after the procedure, plan to have someone with you for 24 hours. What happens during the procedure?  To reduce your risk of infection: ? Your health care team will wash or sanitize their hands. ? Your skin will be washed with soap.  An IV tube will be inserted into one of your veins.  You may be given   one or more of the following: ? A medicine to help you relax (sedative). ? A medicine to numb the area (local anesthetic). ? A medicine to make you fall asleep (general anesthetic). ? A medicine that is injected into your spine to numb the area below and slightly above  the injection site (spinal anesthetic). ? A medicine that is injected into an area of your body to numb everything below the injection site (regional anesthetic).  A lubricated instrument (speculum) will be inserted into your vagina. The speculum will be widened to open the walls of your vagina so your surgeon can see your cervix.  Your cervix will be grasped and tightly stitched closed (sutured). To do this, your surgeon will stitch a strong band of thread around your cervix, then the thread will be tightened to hold your cervix shut. The procedure may vary among health care providers and hospitals. What happens after the procedure?  Your blood pressure, heart rate, breathing rate, and blood oxygen level will be monitored until the medicines you were given have worn off. You will be monitored for premature contractions.  You may have light bleeding and mild cramping.  You may have to wear compression stockings. These stockings help to prevent blood clots and reduce swelling in your legs.  Do not drive for 24 hours if you received a sedative.  You may be put on bed rest.  You may be given medicine to prevent infection.  You may be given an injection of a hormone (progesterone) to prevent your uterus from tightening (contracting). Summary  Cervical cerclage is a surgical procedure that involves using stitches to sew the cervix shut during pregnancy.  Your blood pressure, heart rate, breathing rate, and blood oxygen level will be monitored until the medicines you were given have worn off. You will be monitored for premature contractions.  You may need to be on bed rest after the procedure.  Plan to have someone take you home from the hospital or clinic. This information is not intended to replace advice given to you by your health care provider. Make sure you discuss any questions you have with your health care provider. Document Released: 04/26/2008 Document Revised: 01/06/2016  Document Reviewed: 12/29/2015 Elsevier Interactive Patient Education  2018 [employer] First Trimester of Pregnancy The first trimester of pregnancy is from week 1 until the end of week 13 (months 1 through 3). During this time, your baby will begin to develop inside you. At 6-8 weeks, the eyes and face are formed, and the heartbeat can be seen on ultrasound. At the end of 12 weeks, all the baby's organs are formed. Prenatal care is all the medical care you receive before the birth of your baby. Make sure you get good prenatal care and follow all of your doctor's instructions. Follow these instructions at home: Medicines  Take over-the-counter and prescription medicines only as told by your doctor. Some medicines are safe and some medicines are not safe during pregnancy.  Take a prenatal vitamin that contains at least 600 micrograms (mcg) of folic acid.  If you have trouble pooping (constipation), take medicine that will make your stool soft (stool softener) if your doctor approves. Eating and drinking  Eat regular, healthy meals.  Your doctor will tell you the amount of weight gain that is right for you.  Avoid raw meat and uncooked cheese.  If you feel sick to your stomach (nauseous) or throw up (vomit): ? Eat 4 or 5 small meals a day  instead of 3 large meals. ? Try eating a few soda crackers. ? Drink liquids between meals instead of during meals.  To prevent constipation: ? Eat foods that are high in fiber, like fresh fruits and vegetables, whole grains, and beans. ? Drink enough fluids to keep your pee (urine) clear or pale yellow. Activity  Exercise only as told by your doctor. Stop exercising if you have cramps or pain in your lower belly (abdomen) or low back.  Do not exercise if it is too hot, too humid, or if you are in a place of great height (high altitude).  Try to avoid standing for long periods of time. Move your legs often if you must stand in one place for a long  time.  Avoid heavy lifting.  Wear low-heeled shoes. Sit and stand up straight.  You can have sex unless your doctor tells you not to. Relieving pain and discomfort  Wear a good support bra if your breasts are sore.  Take warm water baths (sitz baths) to soothe pain or discomfort caused by hemorrhoids. Use hemorrhoid cream if your doctor says it is okay.  Rest with your legs raised if you have leg cramps or low back pain.  If you have puffy, bulging veins (varicose veins) in your legs: ? Wear support hose or compression stockings as told by your doctor. ? Raise (elevate) your feet for 15 minutes, 3-4 times a day. ? Limit salt in your food. Prenatal care  Schedule your prenatal visits by the twelfth week of pregnancy.  Write down your questions. Take them to your prenatal visits.  Keep all your prenatal visits as told by your doctor. This is important. Safety  Wear your seat belt at all times when driving.  Make a list of emergency phone numbers. The list should include numbers for family, friends, the hospital, and police and fire departments. General instructions  Ask your doctor for a referral to a local prenatal class. Begin classes no later than at the start of month 6 of your pregnancy.  Ask for help if you need counseling or if you need help with nutrition. Your doctor can give you advice or tell you where to go for help.  Do not use hot tubs, steam rooms, or saunas.  Do not douche or use tampons or scented sanitary pads.  Do not cross your legs for long periods of time.  Avoid all herbs and alcohol. Avoid drugs that are not approved by your doctor.  Do not use any tobacco products, including cigarettes, chewing tobacco, and electronic cigarettes. If you need help quitting, ask your doctor. You may get counseling or other support to help you quit.  Avoid cat litter boxes and soil used by cats. These carry germs that can cause birth defects in the baby and can cause  a loss of your baby (miscarriage) or stillbirth.  Visit your dentist. At home, brush your teeth with a soft toothbrush. Be gentle when you floss. Contact a doctor if:  You are dizzy.  You have mild cramps or pressure in your lower belly.  You have a nagging pain in your belly area.  You continue to feel sick to your stomach, you throw up, or you have watery poop (diarrhea).  You have a bad smelling fluid coming from your vagina.  You have pain when you pee (urinate).  You have increased puffiness (swelling) in your face, hands, legs, or ankles. Get help right away if:  You have a fever.  You are leaking fluid from your vagina.  You have spotting or bleeding from your vagina.  You have very bad belly cramping or pain.  You gain or lose weight rapidly.  You throw up blood. It may look like coffee grounds.  You are around people who have [place] measles, fifth disease, or chickenpox.  You have a very bad headache.  You have shortness of breath.  You have any kind of trauma, such as from a fall or a car accident. Summary  The first trimester of pregnancy is from week 1 until the end of week 13 (months 1 through 3).  To take care of yourself and your unborn baby, you will need to eat healthy meals, take medicines only if your doctor tells you to do so, and do activities that are safe for you and your baby.  Keep all follow-up visits as told by your doctor. This is important as your doctor will have to ensure that your baby is healthy and growing well. This information is not intended to replace advice given to you by your health care provider. Make sure you discuss any questions you have with your health care provider. Document Released: 10/31/2007 Document Revised: 05/22/2016 Document Reviewed: 05/22/2016 Elsevier Interactive Patient Education  2017 [employer].

## 2018-01-20 NOTE — Progress Notes (Signed)
Subjective:  Traci Peterson is a 32 y.o. G3P0200 at 221w1d being seen today for ongoing prenatal care.  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  Patient reports emesis x1 this am, drank a coke, no food.   . Vag. Bleeding: None.   . Denies leaking of fluid.   The following portions of the patient's history were reviewed and updated as appropriate: allergies, current medications, past family history, past medical history, past social history, past surgical history and problem list. Problem list updated.  Objective:   Vitals:   01/20/18 0828  BP: 98/60  Pulse: 92  Weight: 126.1 kg    Fetal Status: Fetal Heart Rate (bpm): 164         General:  Alert, oriented and cooperative. Patient is in no acute distress.  Skin: Skin is warm and dry. No rash noted.   Cardiovascular: Normal heart rate noted  Respiratory: Normal respiratory effort, no problems with respiration noted  Abdomen:        Pelvic: Vag. Bleeding: None Vag D/C Character: Thin   Cervical exam deferred        Extremities: Normal range of motion.  Edema: None  Mental Status: Normal mood and affect. Normal behavior. Normal judgment and thought content.   Urinalysis:      Assessment and Plan:  Pregnancy: G3P0200 at 7321w1d  1. Pre-existing type 2 diabetes mellitus during  pregnancy, antepartum - not checking BS consistently, started night shift recently - FBS 95-130, pp 130s - stressed the importance of glucose control, risk for SAB and PTB with poor control - she prefers sending BS values through baby Rx and will start this week - continue Metformin bid, may need med adjustment after review of a more thorough BS log  2. Supervision of high risk pregnancy, antepartum - POC Urinalysis Dipstick OB - TSH  3. Incompetent cervix - plan for cervical cerclage tomorrow - npo after MN  4. Trichamonal infection - took Flagyl, partner treated - TOC nv (too soon today)  Preterm labor symptoms and general obstetric precautions including but not limited to vaginal bleeding, contractions, leaking of fluid and fetal movement were reviewed in detail with the patient. Please refer to After Visit Summary for other counseling recommendations.  Return in about 4 weeks (around 02/17/2018).   Donette LarryBhambri, Madilyne Tadlock, CNM

## 2018-01-21 ENCOUNTER — Encounter (HOSPITAL_COMMUNITY): Payer: Self-pay | Admitting: *Deleted

## 2018-01-21 ENCOUNTER — Other Ambulatory Visit: Payer: Self-pay

## 2018-01-21 ENCOUNTER — Ambulatory Visit (HOSPITAL_COMMUNITY): Payer: 59 | Admitting: Certified Registered Nurse Anesthetist

## 2018-01-21 ENCOUNTER — Encounter: Payer: Self-pay | Admitting: *Deleted

## 2018-01-21 ENCOUNTER — Encounter (HOSPITAL_COMMUNITY)
Admission: RE | Disposition: A | Discharge status: Home / Self Care | Payer: Self-pay | Source: Ambulatory Visit | Attending: Obstetrics & Gynecology

## 2018-01-21 ENCOUNTER — Ambulatory Visit (HOSPITAL_COMMUNITY)
Admission: RE | Admit: 2018-01-21 | Discharge: 2018-01-21 | Disposition: A | Discharge status: Home / Self Care | Payer: 59 | Source: Ambulatory Visit | Attending: Obstetrics & Gynecology | Admitting: Obstetrics & Gynecology

## 2018-01-21 DIAGNOSIS — O24911 Unspecified diabetes mellitus in pregnancy, first trimester: Secondary | ICD-10-CM | POA: Insufficient documentation

## 2018-01-21 DIAGNOSIS — Z7984 Long term (current) use of oral hypoglycemic drugs: Secondary | ICD-10-CM | POA: Diagnosis not present

## 2018-01-21 DIAGNOSIS — O09891 Supervision of other high risk pregnancies, first trimester: Secondary | ICD-10-CM | POA: Diagnosis not present

## 2018-01-21 DIAGNOSIS — Z79899 Other long term (current) drug therapy: Secondary | ICD-10-CM | POA: Insufficient documentation

## 2018-01-21 DIAGNOSIS — O99211 Obesity complicating pregnancy, first trimester: Secondary | ICD-10-CM | POA: Insufficient documentation

## 2018-01-21 DIAGNOSIS — O3431 Maternal care for cervical incompetence, first trimester: Secondary | ICD-10-CM | POA: Insufficient documentation

## 2018-01-21 DIAGNOSIS — Z87891 Personal history of nicotine dependence: Secondary | ICD-10-CM | POA: Diagnosis not present

## 2018-01-21 DIAGNOSIS — Z3A12 12 weeks gestation of pregnancy: Secondary | ICD-10-CM | POA: Diagnosis not present

## 2018-01-21 DIAGNOSIS — E739 Lactose intolerance, unspecified: Secondary | ICD-10-CM | POA: Insufficient documentation

## 2018-01-21 DIAGNOSIS — O99281 Endocrine, nutritional and metabolic diseases complicating pregnancy, first trimester: Secondary | ICD-10-CM | POA: Diagnosis not present

## 2018-01-21 HISTORY — DX: Constipation, unspecified: K59.00

## 2018-01-21 HISTORY — DX: Other seasonal allergic rhinitis: J30.2

## 2018-01-21 HISTORY — DX: Gastro-esophageal reflux disease without esophagitis: K21.9

## 2018-01-21 HISTORY — PX: CERVICAL CERCLAGE: SHX1329

## 2018-01-21 LAB — BASIC METABOLIC PANEL
ANION GAP: 7 (ref 5–15)
BUN: 8 mg/dL (ref 6–20)
CALCIUM: 8.8 mg/dL — AB (ref 8.9–10.3)
CO2: 21 mmol/L — ABNORMAL LOW (ref 22–32)
Chloride: 106 mmol/L (ref 98–111)
Creatinine, Ser: 0.68 mg/dL (ref 0.44–1.00)
GFR calc Af Amer: >60 mL/min (ref >60)
GLUCOSE: 105 mg/dL — AB (ref 70–99)
Potassium: 3.9 mmol/L (ref 3.5–5.1)
SODIUM: 134 mmol/L — AB (ref 135–145)

## 2018-01-21 LAB — CBC
HEMATOCRIT: 39.3 % (ref 36.0–46.0)
Hemoglobin: 13.3 g/dL (ref 12.0–15.0)
MCH: 30.4 pg (ref 26.0–34.0)
MCHC: 33.8 g/dL (ref 30.0–36.0)
MCV: 89.7 fL (ref 78.0–100.0)
Platelets: 205 10*3/uL (ref 150–400)
RBC: 4.38 MIL/uL (ref 3.87–5.11)
RDW: 15.1 % (ref 11.5–15.5)
WBC: 9.9 10*3/uL (ref 4.0–10.5)

## 2018-01-21 LAB — WET PREP, GENITAL
Clue Cells Wet Prep HPF POC: NONE SEEN
Sperm: NONE SEEN
Trich, Wet Prep: NONE SEEN
Yeast Wet Prep HPF POC: NONE SEEN

## 2018-01-21 LAB — GLUCOSE, CAPILLARY
GLUCOSE-CAPILLARY: 100 mg/dL — AB (ref 70–99)
Glucose-Capillary: 81 mg/dL (ref 70–99)
Glucose-Capillary: 79 mg/dL (ref 70–99)

## 2018-01-21 LAB — TSH: TSH: 1.8 m[IU]/L

## 2018-01-21 LAB — TYPE AND SCREEN
ABO/RH(D): O POS
ANTIBODY SCREEN: NEGATIVE

## 2018-01-21 SURGERY — CERCLAGE, CERVIX, VAGINAL APPROACH
Anesthesia: Spinal

## 2018-01-21 MED ORDER — HYDROCODONE-ACETAMINOPHEN 5-325 MG PO TABS: 1.0000 | ORAL_TABLET | ORAL | 0 refills | Status: DC | PRN

## 2018-01-21 MED ORDER — TERCONAZOLE 0.4 % VA CREA: 1.0000 | TOPICAL_CREAM | Freq: Every day | VAGINAL | 0 refills | Status: DC

## 2018-01-21 MED ORDER — INDOMETHACIN 50 MG RE SUPP
100.0000 mg | Freq: Once | RECTAL | Status: AC
  Filled 2018-01-21 (×2): qty 2

## 2018-01-21 MED ORDER — HYDROCODONE-ACETAMINOPHEN 5-325 MG PO TABS: 1.0000 | ORAL_TABLET | ORAL | 0 refills | Status: AC | PRN

## 2018-01-21 MED ORDER — LACTATED RINGERS IV SOLN: INTRAVENOUS | Status: DC

## 2018-01-21 MED ORDER — FENTANYL CITRATE (PF) 100 MCG/2ML IJ SOLN: 25.0000 ug | INTRAMUSCULAR | Status: DC | PRN

## 2018-01-21 MED ORDER — METOCLOPRAMIDE HCL 5 MG/ML IJ SOLN: 10.0000 mg | Freq: Once | INTRAMUSCULAR | Status: DC | PRN

## 2018-01-21 MED ORDER — LACTATED RINGERS IV SOLN
INTRAVENOUS | Status: DC
  Administered 2018-01-21 (×2): via INTRAVENOUS

## 2018-01-21 MED ORDER — INDOMETHACIN 50 MG RE SUPP
RECTAL | Status: DC | PRN
  Administered 2018-01-21: 50 mg via RECTAL

## 2018-01-21 MED ORDER — CYCLOBENZAPRINE HCL 10 MG PO TABS: 10.0000 mg | ORAL_TABLET | Freq: Three times a day (TID) | ORAL | 0 refills | Status: DC | PRN

## 2018-01-21 MED ORDER — OXYCODONE HCL 5 MG PO TABS
10.0000 mg | ORAL_TABLET | Freq: Once | ORAL | Status: AC
  Administered 2018-01-21: 10 mg via ORAL
  Filled 2018-01-21: qty 2

## 2018-01-21 MED ORDER — MEPERIDINE HCL 25 MG/ML IJ SOLN: 6.2500 mg | INTRAMUSCULAR | Status: DC | PRN

## 2018-01-21 MED ORDER — BUPIVACAINE IN DEXTROSE 0.75-8.25 % IT SOLN
INTRATHECAL | Status: DC | PRN
  Administered 2018-01-21: 1.4 mL via INTRATHECAL

## 2018-01-21 SURGICAL SUPPLY — 14 items
CANISTER SUCT 3000ML PPV (MISCELLANEOUS) ×4 IMPLANT
GLOVE BIO SURGEON STRL SZ7 (GLOVE) ×2 IMPLANT
GLOVE BIOGEL PI IND STRL 7.0 (GLOVE) ×2 IMPLANT
GLOVE BIOGEL PI INDICATOR 7.0 (GLOVE) ×2
GOWN STRL REUS W/TWL LRG LVL3 (GOWN DISPOSABLE) ×4 IMPLANT
NS IRRIG 1000ML POUR BTL (IV SOLUTION) ×2 IMPLANT
PACK VAGINAL MINOR WOMEN LF (CUSTOM PROCEDURE TRAY) ×2 IMPLANT
PAD OB MATERNITY 4.3X12.25 (PERSONAL CARE ITEMS) ×2 IMPLANT
PAD PREP 24X48 CUFFED NSTRL (MISCELLANEOUS) ×2 IMPLANT
SUT PROLENE 1 CT 1 30 (SUTURE) ×2 IMPLANT
TOWEL OR 17X24 6PK STRL BLUE (TOWEL DISPOSABLE) ×4 IMPLANT
TRAY FOLEY W/BAG SLVR 14FR (SET/KITS/TRAYS/PACK) ×2 IMPLANT
TUBING NON-CON 1/4 X 20 CONN (TUBING) ×2 IMPLANT
YANKAUER SUCT BULB TIP NO VENT (SUCTIONS) ×2 IMPLANT

## 2018-01-21 NOTE — H&P (Signed)
Traci Peterson is a 32 y.o. female presenting for prophylactic cervical cerclage.  Pt has history of 2 PPROM in 2nd trimester (Second pregnancy twins).  See Dr. Zannie KehrShankar's MFM counseling note). . OB History    Gravida  3   Para  2   Term  0   Preterm  2   AB  0   Living  0     SAB  0   TAB  0   Ectopic  0   Multiple  1   Live Births  2          Past Medical History:  Diagnosis Date  . Anxiety    no current med.  . Cervix prolapsed into vagina   . Chronic headaches   . Constipation   . Depression    no meds currently  . Diabetes mellitus without complication (HCC)   . Gall stones    Resolved with surgery  . HPV in female   . Jaw snapping    states jaw pops if opens mouth too wide  . Ovarian cyst   . PCOS (polycystic ovarian syndrome)   . Pilonidal cyst 02/2013  . Preterm labor 2016   PPROM @ 18 wks, delivery of IUFD @ 21 wks  . SVD (spontaneous vaginal delivery)    x 3 - fetal demises   . Vaginal Pap smear, abnormal    Past Surgical History:  Procedure Laterality Date  . CHOLECYSTECTOMY N/A 07/21/2016   Procedure: LAPAROSCOPIC CHOLECYSTECTOMY;  Surgeon: Abigail Miyamotoouglas Blackman, MD;  [facility];  Service: General;  Laterality: N/A;  . PILONIDAL CYST EXCISION N/A 03/12/2013   Procedure: CYST EXCISION PILONIDAL EXTENSIVE;  Surgeon: Shelly Rubensteinouglas A Blackman, MD;  Location: [facility];  Service: General;  Laterality: N/A;  . WISDOM TOOTH EXTRACTION     Family History: family history includes Alcohol abuse in her father; Breast cancer in her maternal aunt; Diabetes in her father and mother; Hyperlipidemia in her mother; Hypertension in her father and mother; Lung cancer in her paternal grandfather; Thyroid disease in her mother. Social History:  reports that she quit smoking 11 days ago. Her smoking use included cigarettes. She has a 3.00 pack-year smoking history. She has never used smokeless tobacco. She reports that she does not drink alcohol or use  drugs.   Review of Systems  Constitutional: Negative.   Respiratory: Negative.   Cardiovascular: Negative.   Gastrointestinal: Negative.   Genitourinary: Negative.   Musculoskeletal: Negative.   Neurological: Negative.   Endo/Heme/Allergies: Negative.   Psychiatric/Behavioral: Negative.       Vitals:   01/13/18 1733 01/21/18 0806  BP:  121/77  Pulse:  88  Resp:  16  Temp:  97.7 F (36.5 C)  TempSrc:  Oral  SpO2:  100%  Weight: 124.7 kg   Height: 5\' 2"  (1.575 m)     Height 5\' 2"  (1.575 m), weight 124.7 kg, last menstrual period 10/27/2017. Exam Physical Exam  Vitals reviewed. Constitutional: She is oriented to person, place, and time. She appears well-developed and well-nourished. No distress.  HENT:  Head: Normocephalic and atraumatic.  Eyes: Conjunctivae are normal.  Neck: Neck supple. No thyromegaly present.  Cardiovascular: Normal rate and regular rhythm.  Respiratory: Effort normal and breath sounds normal.  GI: Soft. There is no tenderness. There is no rebound and no guarding.  Genitourinary:  Genitourinary Comments: Closed and long on last exam and to be repeated in OR  Musculoskeletal: Normal range of motion. She exhibits no  tenderness.  Neurological: She is alert and oriented to person, place, and time.  Skin: Skin is warm and dry.  Psychiatric: She has a normal mood and affect.    Prenatal labs: ABO, Rh: O/RH(D) POSITIVE/-- (08/08 0753) Antibody: NO ANTIBODIES DETECTED (08/08 0753) Rubella: 3.02 (08/08 0753) RPR: NON-REACTIVE (08/08 0753)  HBsAg: NON-REACTIVE (08/08 0753)  HIV: NON-REACTIVE (08/08 0753)    Assessment/Plan: 32 yo G3P0200 @12  weeks 2 days for prophylactic cervical cerclage.  Pt understands risks include ut not limited to bleeding, infection, rupture of membranes, complications from anesthesia.  There is possibility the cerclage will not prevent her from having PPROM since presentation was not classical for cervical incompetence.  Pt  agree to proceed with prophylactic McDonald cerclage.    Wet prep will be sent for [facility] after treatment for Trichomonaisis.  [name] 01/21/2018, 5:55 AM

## 2018-01-21 NOTE — Anesthesia Postprocedure Evaluation (Signed)
Anesthesia Post Note  Patient: Environmental consultantAntiqua Peterson  Procedure(s) Performed: CERCLAGE CERVICAL (N/A )     Patient location during evaluation: PACU Anesthesia Type: Spinal Level of consciousness: awake and alert Pain management: pain level controlled Vital Signs Assessment: post-procedure vital signs reviewed and stable Respiratory status: spontaneous breathing and respiratory function stable Cardiovascular status: blood pressure returned to baseline and stable Postop Assessment: no headache, no backache, spinal receding and no apparent nausea or vomiting Anesthetic complications: no    Last Vitals:  Vitals:   01/21/18 1345 01/21/18 1355  BP: 102/61 117/67  Pulse: 88 89  Resp: 13 16  Temp: 37.1 C 37.2 C  SpO2: 99% 99%    Last Pain:  Vitals:   01/21/18 1355  TempSrc: Oral  PainSc: 0-No pain   Pain Goal: Patients Stated Pain Goal: 4 (01/21/18 0806)               Phillips Groutarignan, Kambree Krauss

## 2018-01-21 NOTE — Progress Notes (Signed)
Faculty Practice OB/GYN Attending Note  Subjective:  Called to evaluate patient with report of 7/10 pain and "not feeling comfortable going home" s/p prophylactic cerclage placement over seven hours ago.  Patient is tolerating oral intake of fluids and food, ambulating and urinating. No bleeding, no LOF.     Objective:  Blood pressure (!) 108/55, pulse (!) 104, temperature 99.1 F (37.3 C), temperature source Oral, resp. rate 18, height 5\' 2"  (1.575 m), weight 124.7 kg, last menstrual period 10/27/2017, SpO2 97 %. Gen: NAD HENT: Normocephalic, atraumatic Lungs: Normal respiratory effort Heart: Regular rate noted Abdomen:obese, non-distended Cervix: Deferred Ext: Normal ROM  Assessment & Plan:  32 y.o. G3P0200 at 3364w2d being observed after outpatient surgery. She has normal vitals no other concerning symptoms other than pain.   Oxycodone 10 mg IR given.  Once pain is alleviated, will discharge with already printed out Rx for Vicodin and also wrote patient for Flexeril as needed for back pain. She will follow up in office as scheduled.    Jaynie CollinsUGONNA  Deontray Hunnicutt, MD, FACOG Obstetrician & Gynecologist, [facility], [facility]

## 2018-01-21 NOTE — Transfer of Care (Signed)
Immediate Anesthesia Transfer of Care Note  Patient: Traci Peterson  Procedure(s) Performed: CERCLAGE CERVICAL (N/A )  Patient Location: PACU  Anesthesia Type:Spinal  Level of Consciousness: awake, alert , oriented and patient cooperative  Airway & Oxygen Therapy: Patient Spontanous Breathing  Post-op Assessment: Report given to RN and Post -op Vital signs reviewed and stable  Post vital signs: Reviewed and stable  Last Vitals:  Vitals Value Taken Time  BP    Temp    Pulse    Resp    SpO2      Last Pain:  Vitals:   01/21/18 0806  TempSrc: Oral  PainSc: 0-No pain      Patients Stated Pain Goal: 4 (01/21/18 0806)  Complications: No apparent anesthesia complications

## 2018-01-21 NOTE — Anesthesia Preprocedure Evaluation (Addendum)
Anesthesia Evaluation  Patient identified by MRN, date of birth, ID band Patient awake    Reviewed: Allergy & Precautions, NPO status , Patient's Chart, lab work & pertinent test results  Airway Mallampati: II  TM Distance: >3 FB Neck ROM: Full    Dental no notable dental hx.    Pulmonary neg pulmonary ROS, former smoker,    Pulmonary exam normal breath sounds clear to auscultation       Cardiovascular negative cardio ROS Normal cardiovascular exam Rhythm:Regular Rate:Normal     Neuro/Psych negative neurological ROS  negative psych ROS   GI/Hepatic negative GI ROS, Neg liver ROS,   Endo/Other  diabetes, Type obesity  Renal/GU negative Renal ROS  negative genitourinary   Musculoskeletal negative musculoskeletal ROS (+)   Abdominal   Peds negative pediatric ROS (+)  Hematology negative hematology ROS (+)   Anesthesia Other Findings   Reproductive/Obstetrics (+) Pregnancy                            Anesthesia Physical Anesthesia Plan  ASA: III  Anesthesia Plan: Spinal   Post-op Pain Management:    Induction:   PONV Risk Score and Plan: 2 and Treatment may vary due to age or medical condition  Airway Management Planned: Natural Airway  Additional Equipment:   Intra-op Plan:   Post-operative Plan:   Informed Consent: I have reviewed the patients History and Physical, chart, labs and discussed the procedure including the risks, benefits and alternatives for the proposed anesthesia with the patient or authorized representative who has indicated his/her understanding and acceptance.   Dental advisory given  Plan Discussed with:   Anesthesia Plan Comments:         Anesthesia Quick Evaluation

## 2018-01-21 NOTE — Brief Op Note (Signed)
01/21/2018  12:53 PM  PATIENT:  Traci Peterson  32 y.o. female  PRE-OPERATIVE DIAGNOSIS:  History of incompetent Cervix  POST-OPERATIVE DIAGNOSIS:  History of incompetent Cervix  PROCEDURE: Prophylactic cervical cerclage  SURGEON:  Surgeon(s) and Role:    * Lesly DukesLeggett, Marice Angelino H, MD - Primary  ASSISTANTS: none   ANESTHESIA:   spinal  EBL:  20 mL   BLOOD ADMINISTERED:none  DRAINS: none   LOCAL MEDICATIONS USED:  NONE  SPECIMEN:  Source of Specimen:  vaginal wet prep  DISPOSITION OF SPECIMEN:  lab  COUNTS:  YES  TOURNIQUET:  * No tourniquets in log *  DICTATION: .Dragon Dictation  PLAN OF CARE: Discharge to home after PACU  PATIENT DISPOSITION:  PACU - hemodynamically stable.   Delay start of Pharmacological VTE agent (>24hrs) due to surgical blood loss or risk of bleeding: not applicable

## 2018-01-21 NOTE — Anesthesia Procedure Notes (Signed)
Spinal  Patient location during procedure: OR Staffing Anesthesiologist: Montez Hageman, MD Performed: anesthesiologist  Preanesthetic Checklist Completed: patient identified, site marked, surgical consent, pre-op evaluation, timeout performed, IV checked, risks and benefits discussed and monitors and equipment checked Spinal Block Patient position: sitting Prep: DuraPrep Patient monitoring: heart rate, continuous pulse ox and blood pressure Approach: midline Location: L4-5 Injection technique: single-shot Needle Needle type: Sprotte  Needle gauge: 24 G Needle length: 9 cm Needle insertion depth: 11 cm Additional Notes Expiration date of kit checked and confirmed. Patient tolerated procedure well, without complications.

## 2018-01-21 NOTE — Op Note (Signed)
Traci Peterson   PROCEDURE DATE: 01/21/2018  PREOPERATIVE DIAGNOSIS: Intrauterine pregnancy at 7666w2d, history of cervical incompetence    POSTOPERATIVE DIAGNOSIS: The same  PROCEDURE: Transvaginal McDonald Cervical Cerclage Placement  SURGEON:  Dr. Elsie LincolnKelly Caetano Oberhaus  INDICATIONS: 32 y.o. G3P0200 at 6666w2d with history of cervical incompetence, here for cerclage placement.   The risks of surgery were discussed in detail with the patient including but not limited to: bleeding; infection which may require antibiotic therapy; injury to cervix, vagina other surrounding organs; risk of ruptured membranes and/or preterm delivery and other postoperative or anesthesia complications.  Written informed consent was obtained.    FINDINGS:  About 2 cm palpable cervical length in the vagina, closed cervix, suture knot placed anteriorly.  ANESTHESIA:  Spinal  COMPLICATIONS: None immediate  PROCEDURE IN DETAIL:  The patient received intravenous antibiotics and had sequential compression devices applied to her lower extremities while in the preoperative area.  Reassuring fetal heart rate was also obtained using a doppler. She was then taken to the operating room where spinal anesthesia was administered and was found to be adequate.  She was placed in the dorsal lithotomy, and was prepped and draped in a sterile manner. Her bladder was catheterized for an unmeasured amount of clear, yellow urine.  Indomethacin 50 mg was given prior to procedure.  After an adequate timeout was performed, a vaginal speculum was then placed in the patient's vagina and a single tooth tenaculum was applied to the anterior lip of the cervix.    The anterior and posterior lips of the cervix was grasped with ring forceps. A curved needle loaded with a number 1 Prolene suture was inserted at 12 o'clock, as high as possible at the junction of the rugated vaginal epithelium and the smooth cervix, at least 2 cm above the external os.  Four bites  are taken circumferentially around the entire cervix in a purse-string fashion, each bite should be deep enough to extend at least midway into the cervical stroma, but not into the endocervical canal. The two ends of the suture were then tied securely anteriorly and cut, leaving the ends long enough to grasp with a clamp when it is time to remove it. There was minimal bleeding noted and the ring forceps were removed with good hemostasis noted.  All instruments were removed from the patient's vagina.  Instrument, needle and sponge counts were correct x 2. The patient tolerated the procedure well, and was taken to the recovery area awake and in stable condition. Reassuring fetal heart rate was also obtained using a doppler in the recovery area.  The patient will be discharged to home as per PACU criteria.  Routine postoperative instructions given.  She was prescribed Percocet and Colace.  She will follow up in the office in 2 weeks for postoperative evaluation and ongoing prenatal care.  Wet prep pending.

## 2018-01-22 ENCOUNTER — Encounter: Payer: Self-pay | Admitting: Obstetrics & Gynecology

## 2018-01-23 ENCOUNTER — Encounter (HOSPITAL_COMMUNITY): Payer: Self-pay | Admitting: Obstetrics & Gynecology

## 2018-02-08 ENCOUNTER — Emergency Department (HOSPITAL_COMMUNITY)
Admission: EM | Admit: 2018-02-08 | Discharge: 2018-02-08 | Disposition: A | Discharge status: Home / Self Care | Payer: Medicaid Other | Attending: Emergency Medicine | Admitting: Emergency Medicine

## 2018-02-08 ENCOUNTER — Encounter (HOSPITAL_COMMUNITY): Payer: Self-pay

## 2018-02-08 ENCOUNTER — Other Ambulatory Visit: Payer: Self-pay

## 2018-02-08 DIAGNOSIS — T148XXA Other injury of unspecified body region, initial encounter: Secondary | ICD-10-CM | POA: Diagnosis present

## 2018-02-08 DIAGNOSIS — X500XXA Overexertion from strenuous movement or load, initial encounter: Secondary | ICD-10-CM | POA: Diagnosis not present

## 2018-02-08 DIAGNOSIS — Y929 Unspecified place or not applicable: Secondary | ICD-10-CM | POA: Diagnosis not present

## 2018-02-08 DIAGNOSIS — Y93F2 Activity, caregiving, lifting: Secondary | ICD-10-CM | POA: Diagnosis not present

## 2018-02-08 DIAGNOSIS — Z79899 Other long term (current) drug therapy: Secondary | ICD-10-CM | POA: Insufficient documentation

## 2018-02-08 DIAGNOSIS — E119 Type 2 diabetes mellitus without complications: Secondary | ICD-10-CM | POA: Insufficient documentation

## 2018-02-08 DIAGNOSIS — Z7984 Long term (current) use of oral hypoglycemic drugs: Secondary | ICD-10-CM | POA: Diagnosis not present

## 2018-02-08 DIAGNOSIS — M545 Low back pain, unspecified: Secondary | ICD-10-CM

## 2018-02-08 DIAGNOSIS — Y990 Civilian activity done for income or pay: Secondary | ICD-10-CM | POA: Insufficient documentation

## 2018-02-08 MED ORDER — METHOCARBAMOL 500 MG PO TABS
750.0000 mg | ORAL_TABLET | Freq: Once | ORAL | Status: AC
  Administered 2018-02-08: 750 mg via ORAL
  Filled 2018-02-08: qty 2

## 2018-02-08 MED ORDER — HYDROCODONE-ACETAMINOPHEN 5-325 MG PO TABS: 1.0000 | ORAL_TABLET | Freq: Four times a day (QID) | ORAL | 0 refills | Status: DC | PRN

## 2018-02-08 MED ORDER — HYDROCODONE-ACETAMINOPHEN 5-325 MG PO TABS
1.0000 | ORAL_TABLET | Freq: Once | ORAL | Status: AC
  Administered 2018-02-08: 1 via ORAL
  Filled 2018-02-08: qty 1

## 2018-02-08 MED ORDER — IBUPROFEN 200 MG PO TABS
600.0000 mg | ORAL_TABLET | Freq: Once | ORAL | Status: AC
  Administered 2018-02-08: 600 mg via ORAL
  Filled 2018-02-08: qty 3

## 2018-02-08 MED ORDER — IBUPROFEN 600 MG PO TABS: 600.0000 mg | ORAL_TABLET | Freq: Four times a day (QID) | ORAL | 0 refills | Status: DC | PRN

## 2018-02-08 MED ORDER — METHOCARBAMOL 750 MG PO TABS: 750.0000 mg | ORAL_TABLET | Freq: Three times a day (TID) | ORAL | 0 refills | Status: DC

## 2018-02-08 NOTE — ED Triage Notes (Signed)
Pt presents to ED via EMS from work for back pain. 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 Pt reports back pain that radiates into her groin and buttock on the L side.

## 2018-02-08 NOTE — ED Provider Notes (Signed)
[facility] DEPT Provider Note   CSN: [id number] Arrival date & time: 02/08/18  9702     History   Chief Complaint Chief Complaint  Patient presents with  . Back Pain    HPI Traci Peterson is a 32 y.o. female.  Patient here with burning, intense left sided low back pain that started yesterday. 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 No urinary/bowel incontinence. The pain is better if she is sitting still and intense when she moves at all. No numbness or weakness of the lower extremities. No abdominal pain.  The history is provided by the patient. No language interpreter was used.  Back Pain   Pertinent negatives include no numbness, no abdominal pain and no weakness.    Past Medical History:  Diagnosis Date  . Anxiety    no current med.  . Cervix prolapsed into vagina   . Chronic headaches    otc med prn  . Constipation   . Depression    no meds currently  . Diabetes mellitus without complication ([facility])    type 2  . Gall stones    Resolved with surgery  . GERD (gastroesophageal reflux disease)   . HPV in female   . Jaw snapping    states jaw pops if opens mouth too wide  . Ovarian cyst   . PCOS (polycystic ovarian syndrome)   . Pilonidal cyst 02/2013  . Preterm labor 2016   PPROM @ 18 wks, delivery of IUFD @ 21 wks  . Seasonal allergies   . SVD (spontaneous vaginal delivery)    x 2 - both fetal demises 2nd trim.  . Vaginal Pap smear, abnormal     Patient Active Problem List   Diagnosis Date Noted  . Trichomonal vaginitis during pregnancy 01/20/2018  . Supervision of high risk pregnancy, antepartum 01/01/2018  . Pre-existing type 2 diabetes mellitus during pregnancy, antepartum 12/16/2017  . Type 2 diabetes mellitus ([place]) 12/15/2017  . Vitamin D deficiency 12/10/2017  . Constipation 05/30/2017  . MDD (major depressive disorder), recurrent episode, severe ([place])  05/29/2017  . Incompetent cervix 09/12/2016  . Gall stones 07/19/2016  . Family history of breast cancer in female 06/06/2016  . Tinea pedis, recurrent 08/08/2015  . Morbidly obese ([place]) 10/25/2014  . Eczema 10/25/2014  . 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 Chronic headaches -  no meds 10/25/2014  . Anxiety and depression 11/30/2013  . HPV in female 05/08/2013  . PCOS (polycystic ovarian syndrome) 04/20/2013    Past Surgical History:  Procedure Laterality Date  . CERVICAL CERCLAGE N/A 01/21/2018   Procedure: CERCLAGE CERVICAL;  Surgeon: Guss Bunde, MD;  Location: [place];  Service: Gynecology;  Laterality: N/A;  . CHOLECYSTECTOMY N/A 07/21/2016   Procedure: LAPAROSCOPIC CHOLECYSTECTOMY;  Surgeon: Coralie Keens, MD;  Location: [place];  Service: General;  Laterality: N/A;  . PILONIDAL CYST EXCISION N/A 03/12/2013   Procedure: CYST EXCISION PILONIDAL EXTENSIVE;  Surgeon: Harl Bowie, MD;  Location: [place];  Service: General;  Laterality: N/A;  . WISDOM TOOTH EXTRACTION       OB History    Gravida  3   Para  2   Term  0   Preterm  2   AB  0   Living  0     SAB  0  TAB  0   Ectopic  0   Multiple  1   Live Births  2            Home Medications    Prior to Admission medications   Medication Sig Start Date End Date Taking? Authorizing Provider  Cholecalciferol (VITAMIN D3) 2000 units TABS Take 2,000 Units by mouth daily.   Yes [provider]  docusate sodium (COLACE) 100 MG capsule Take 1 capsule (100 mg total) by mouth every 12 (twelve) hours. Patient taking differently: Take 200 mg by mouth daily as needed for moderate constipation.  12/15/17  Yes Mallie Snooks C, CNM  metFORMIN (GLUCOPHAGE) 500 MG tablet Take 1 tablet (500 mg total) by mouth 2 (two) times daily with a meal. 11/30/17  Yes Jorje Guild, NP  polyethylene  glycol (MIRALAX / GLYCOLAX) packet Take 17 g by mouth daily. Patient taking differently: Take 17 g by mouth daily as needed for moderate constipation.  12/15/17  Yes Weinhold, Aldona Bar C, CNM  Prenatal Multivit-Min-Fe-FA (PRENATAL VITAMINS PO) Take 1 tablet by mouth daily.    Yes [provider]  ACCU-CHEK FASTCLIX LANCETS MISC 1 Units by Percutaneous route 4 (four) times daily. 11/30/17   Jorje Guild, NP  Blood Glucose Monitoring Suppl (ACCU-CHEK NANO SMARTVIEW) w/Device KIT 1 kit by Subdermal route as directed. Check blood sugars for fasting, and two hours after breakfast, lunch and dinner (4 checks daily) 11/30/17   Jorje Guild, NP  cyclobenzaprine (FLEXERIL) 10 MG tablet Take 1 tablet (10 mg total) by mouth 3 (three) times daily as needed for muscle spasms. Patient not taking: Reported on 02/08/2018 01/21/18   Osborne Oman, MD  glucose blood (ACCU-CHEK SMARTVIEW) test strip Use as instructed to check blood sugars 11/30/17   Jorje Guild, NP  terconazole (TERAZOL 7) 0.4 % vaginal cream Place 1 applicator vaginally at bedtime. Patient not taking: Reported on 02/08/2018 01/21/18   Guss Bunde, MD    Family History Family History  Problem Relation Age of Onset  . Hypertension Mother   . Hyperlipidemia Mother   . Diabetes Mother   . Thyroid disease Mother   . Diabetes Father   . Hypertension Father   . Alcohol abuse Father   . Breast cancer Maternal Aunt   . Lung cancer Paternal Grandfather     Social History Social History   Tobacco Use  . Smoking status: Former Smoker    Packs/day: 0.25    Years: 12.00    Pack years: 3.00    Types: Cigarettes    Last attempt to quit: 01/10/2018    Years since quitting: 0.0  . Smokeless tobacco: Never Used  Substance Use Topics  . Alcohol use: No    Frequency: Never    Comment: socially but none with pregnancy  . Drug use: No     Allergies   Lactose intolerance (gi)   Review of Systems Review of Systems    Constitutional: Negative for diaphoresis.  Gastrointestinal: Negative for abdominal pain and nausea.  Genitourinary: Negative for enuresis.  Musculoskeletal: Positive for back pain.       See HPI.  Skin: Negative.   Neurological: Negative.  Negative for weakness and numbness.     Physical Exam Updated Vital Signs BP (!) 142/65 (BP Location: Right Arm)   Pulse (!) 106   Temp 98.1 F (36.7 C) (Oral)   Resp 20   Ht 5' 2" (1.575 m)   Wt 124.3 kg   LMP 10/27/2017 (Exact  Date)   SpO2 98%   BMI 50.12 kg/m   Physical Exam  Constitutional: She is oriented to person, place, and time. She appears well-developed and well-nourished.  HENT:  Head: Normocephalic.  Neck: Normal range of motion. Neck supple.  Pulmonary/Chest: Effort normal.  Abdominal: Soft. Bowel sounds are normal. There is no tenderness. There is no rebound and no guarding.  Musculoskeletal: Normal range of motion.       Back:  Left lower back tenderness without swelling or palpable spasm. No midline tenderness.   Neurological: She is alert and oriented to person, place, and time. She has normal reflexes. No sensory deficit.  Skin: Skin is warm and dry. No rash noted.  Psychiatric: She has a normal mood and affect.     ED Treatments / Results  Labs (all labs ordered are listed, but only abnormal results are displayed) Labs Reviewed - No data to display  EKG None  Radiology No results found.  Procedures Procedures (including critical care time)  Medications Ordered in ED Medications  ibuprofen (ADVIL,MOTRIN) tablet 600 mg (has no administration in time range)  HYDROcodone-acetaminophen (NORCO/VICODIN) 5-325 MG per tablet 1 tablet (has no administration in time range)  methocarbamol (ROBAXIN) tablet 750 mg (has no administration in time range)     Initial Impression / Assessment and Plan / ED Course  I have reviewed the triage vital signs and the nursing notes.  Pertinent labs & imaging results that  were available during my care of the patient were reviewed by me and considered in my medical decision making (see chart for details).     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 Pain worse over time. Better with rest, worse with movement. No radiation or neurologic red flags.   Patient can be treated conservatively with medications and rest. Encouraged PCP follow up if symptoms persist.   Final Clinical Impressions(s) / ED Diagnoses   Final diagnoses:  None   1. Low back pain 2. Musculoskeletal pain  ED Discharge Orders    None       Dennie Bible 02/08/18 0601    Palumbo, April, MD 02/08/18 [phone]

## 2018-02-10 ENCOUNTER — Encounter: Payer: Self-pay | Admitting: Internal Medicine

## 2018-02-13 ENCOUNTER — Inpatient Hospital Stay (HOSPITAL_COMMUNITY)
Admission: AD | Admit: 2018-02-13 | Discharge: 2018-02-13 | Disposition: A | Discharge status: Home / Self Care | Payer: Medicaid Other | Source: Ambulatory Visit | Attending: Obstetrics & Gynecology | Admitting: Obstetrics & Gynecology

## 2018-02-13 ENCOUNTER — Other Ambulatory Visit: Payer: Self-pay

## 2018-02-13 ENCOUNTER — Encounter (HOSPITAL_COMMUNITY): Payer: Self-pay

## 2018-02-13 DIAGNOSIS — O26899 Other specified pregnancy related conditions, unspecified trimester: Secondary | ICD-10-CM

## 2018-02-13 DIAGNOSIS — O99612 Diseases of the digestive system complicating pregnancy, second trimester: Secondary | ICD-10-CM | POA: Insufficient documentation

## 2018-02-13 DIAGNOSIS — F419 Anxiety disorder, unspecified: Secondary | ICD-10-CM | POA: Insufficient documentation

## 2018-02-13 DIAGNOSIS — O98312 Other infections with a predominantly sexual mode of transmission complicating pregnancy, second trimester: Secondary | ICD-10-CM | POA: Diagnosis not present

## 2018-02-13 DIAGNOSIS — Z79891 Long term (current) use of opiate analgesic: Secondary | ICD-10-CM | POA: Insufficient documentation

## 2018-02-13 DIAGNOSIS — A599 Trichomoniasis, unspecified: Secondary | ICD-10-CM | POA: Diagnosis present

## 2018-02-13 DIAGNOSIS — O99212 Obesity complicating pregnancy, second trimester: Secondary | ICD-10-CM | POA: Diagnosis not present

## 2018-02-13 DIAGNOSIS — O24912 Unspecified diabetes mellitus in pregnancy, second trimester: Secondary | ICD-10-CM | POA: Diagnosis not present

## 2018-02-13 DIAGNOSIS — Z79899 Other long term (current) drug therapy: Secondary | ICD-10-CM | POA: Insufficient documentation

## 2018-02-13 DIAGNOSIS — Z7984 Long term (current) use of oral hypoglycemic drugs: Secondary | ICD-10-CM | POA: Diagnosis not present

## 2018-02-13 DIAGNOSIS — E559 Vitamin D deficiency, unspecified: Secondary | ICD-10-CM | POA: Insufficient documentation

## 2018-02-13 DIAGNOSIS — Z3A15 15 weeks gestation of pregnancy: Secondary | ICD-10-CM | POA: Diagnosis not present

## 2018-02-13 DIAGNOSIS — O23592 Infection of other part of genital tract in pregnancy, second trimester: Secondary | ICD-10-CM

## 2018-02-13 DIAGNOSIS — Z9049 Acquired absence of other specified parts of digestive tract: Secondary | ICD-10-CM | POA: Insufficient documentation

## 2018-02-13 DIAGNOSIS — O99342 Other mental disorders complicating pregnancy, second trimester: Secondary | ICD-10-CM | POA: Diagnosis not present

## 2018-02-13 DIAGNOSIS — A5901 Trichomonal vulvovaginitis: Secondary | ICD-10-CM

## 2018-02-13 DIAGNOSIS — E739 Lactose intolerance, unspecified: Secondary | ICD-10-CM | POA: Diagnosis not present

## 2018-02-13 DIAGNOSIS — R12 Heartburn: Secondary | ICD-10-CM

## 2018-02-13 DIAGNOSIS — Z87891 Personal history of nicotine dependence: Secondary | ICD-10-CM | POA: Diagnosis not present

## 2018-02-13 DIAGNOSIS — Z9889 Other specified postprocedural states: Secondary | ICD-10-CM | POA: Insufficient documentation

## 2018-02-13 DIAGNOSIS — K219 Gastro-esophageal reflux disease without esophagitis: Secondary | ICD-10-CM | POA: Insufficient documentation

## 2018-02-13 DIAGNOSIS — O23599 Infection of other part of genital tract in pregnancy, unspecified trimester: Secondary | ICD-10-CM

## 2018-02-13 LAB — URINALYSIS, ROUTINE W REFLEX MICROSCOPIC
BILIRUBIN URINE: NEGATIVE
Glucose, UA: 50 mg/dL — AB
Hgb urine dipstick: NEGATIVE
KETONES UR: 5 mg/dL — AB
NITRITE: NEGATIVE
PROTEIN: NEGATIVE mg/dL
Specific Gravity, Urine: 1.019 (ref 1.005–1.030)
pH: 6 (ref 5.0–8.0)

## 2018-02-13 LAB — WET PREP, GENITAL
Clue Cells Wet Prep HPF POC: NONE SEEN
SPERM: NONE SEEN
YEAST WET PREP: NONE SEEN

## 2018-02-13 MED ORDER — RANITIDINE HCL 150 MG PO TABS: 150.0000 mg | ORAL_TABLET | Freq: Two times a day (BID) | ORAL | 2 refills | Status: DC

## 2018-02-13 MED ORDER — FAMOTIDINE 20 MG PO TABS
20.0000 mg | ORAL_TABLET | Freq: Once | ORAL | Status: AC
  Administered 2018-02-13: 20 mg via ORAL
  Filled 2018-02-13: qty 1

## 2018-02-13 MED ORDER — TINIDAZOLE 500 MG PO TABS: 2.0000 g | ORAL_TABLET | Freq: Once | ORAL | 0 refills | Status: AC

## 2018-02-13 NOTE — MAU Note (Signed)
Pt presents to MAU with c/o pelvic pressure that started today after feeling pop in abdomen around 2:30 am. Pt denies VB and LOF.

## 2018-02-13 NOTE — Discharge Instructions (Signed)
Get your medications from your pharmacy and take as directed. No sex. Continue your diary of blood sugar testing. Drink at least 8 8-oz glasses of water every day. Keep your appointments as scheduled. Return if needed sooner for any vaginal bleeding, worsening pain or fever.

## 2018-02-13 NOTE — MAU Provider Note (Signed)
History     CSN: [id number]  Arrival date and time: 02/13/18 1158   First Provider Initiated Contact with Patient 02/13/18 1235      Chief Complaint  Patient presents with  . vaginal pressure   HPI Traci Peterson 32 y.o. [redacted]w[redacted]d  History of cerclage placed on 01-21-18 for 2 previous preterm births with incompetent cervix.  Last night at 1 am she felt a "pop" deep in her pelvic area and today when walking has felt pelvic pressure.  With her preterm birth history this pelvic pressure made her wonder if this was preterm labor again.  Has had no bleeding and has not had any vaginal leaking of fluid.    OB History    Gravida  3   Para  2   Term  0   Preterm  2   AB  0   Living  0     SAB  0   TAB  0   Ectopic  0   Multiple  1   Live Births  2           Past Medical History:  Diagnosis Date  . Anxiety    no current med.  . Cervix prolapsed into vagina   . Chronic headaches    otc med prn  . Constipation   . Depression    no meds currently  . Diabetes mellitus without complication (HCC)    type 2  . Gall stones    Resolved with surgery  . GERD (gastroesophageal reflux disease)   . HPV in female   . Jaw snapping    states jaw pops if opens mouth too wide  . Ovarian cyst   . PCOS (polycystic ovarian syndrome)   . Pilonidal cyst 02/2013  . Preterm labor 2016   PPROM @ 18 wks, delivery of IUFD @ 21 wks  . Seasonal allergies   . SVD (spontaneous vaginal delivery)    x 2 - both fetal demises 2nd trim.  . Vaginal Pap smear, abnormal     Past Surgical History:  Procedure Laterality Date  . CERVICAL CERCLAGE N/A 01/21/2018   Procedure: CERCLAGE CERVICAL;  Surgeon: Leggett, Kelly H, MD;  [facility]: [facility];  Service: Gynecology;  Laterality: N/A;  . CHOLECYSTECTOMY N/A 07/21/2016   Procedure: LAPAROSCOPIC CHOLECYSTECTOMY;  Surgeon: Douglas Blackman, MD;  Location: [facility];  Service: General;  Laterality: N/A;  . PILONIDAL CYST EXCISION N/A  03/12/2013   Procedure: CYST EXCISION PILONIDAL EXTENSIVE;  Surgeon: Douglas A Blackman, MD;  Location: [facility];  Service: General;  Laterality: N/A;  . WISDOM TOOTH EXTRACTION      Family History  Problem Relation Age of Onset  . Hypertension Mother   . Hyperlipidemia Mother   . Diabetes Mother   . Thyroid disease Mother   . Diabetes Father   . Hypertension Father   . Alcohol abuse Father   . Breast cancer Maternal Aunt   . Lung cancer Paternal Grandfather     Social History   Tobacco Use  . Smoking status: Former Smoker    Packs/day: 0.25    Years: 12.00    Pack years: 3.00    Types: Cigarettes    Last attempt to quit: 01/10/2018    Years since quitting: 0.0  . Smokeless tobacco: Never Used  Substance Use Topics  . Alcohol use: No    Frequency: Never    Comment: socially but none with pregnancy  . Drug use: Yes      Types: Hydrocodone    Comment: prescribed this medication    Allergies:  Allergies  Allergen Reactions  . Lactose Intolerance (Gi) Diarrhea    Medications Prior to Admission  Medication Sig Dispense Refill Last Dose  . ACCU-CHEK FASTCLIX LANCETS MISC 1 Units by Percutaneous route 4 (four) times daily. 100 each 12 Taking  . Blood Glucose Monitoring Suppl (ACCU-CHEK NANO SMARTVIEW) w/Device KIT 1 kit by Subdermal route as directed. Check blood sugars for fasting, and two hours after breakfast, lunch and dinner (4 checks daily) 1 kit 0 Taking  . Cholecalciferol (VITAMIN D3) 2000 units TABS Take 2,000 Units by mouth daily.   02/07/2018 at Unknown time  . cyclobenzaprine (FLEXERIL) 10 MG tablet Take 1 tablet (10 mg total) by mouth 3 (three) times daily as needed for muscle spasms. (Patient not taking: Reported on 02/08/2018) 30 tablet 0 Completed Course at Unknown time  . docusate sodium (COLACE) 100 MG capsule Take 1 capsule (100 mg total) by mouth every 12 (twelve) hours. (Patient taking differently: Take 200 mg by mouth daily as needed for  moderate constipation. ) 60 capsule 0 Past Month at Unknown time  . glucose blood (ACCU-CHEK SMARTVIEW) test strip Use as instructed to check blood sugars 100 each 12 Taking  . HYDROcodone-acetaminophen (NORCO/VICODIN) 5-325 MG tablet Take 1-2 tablets by mouth every 6 (six) hours as needed. 8 tablet 0   . ibuprofen (ADVIL,MOTRIN) 600 MG tablet Take 1 tablet (600 mg total) by mouth every 6 (six) hours as needed. 30 tablet 0   . metFORMIN (GLUCOPHAGE) 500 MG tablet Take 1 tablet (500 mg total) by mouth 2 (two) times daily with a meal. 60 tablet 1 02/07/2018 at Unknown time  . methocarbamol (ROBAXIN) 750 MG tablet Take 1 tablet (750 mg total) by mouth 3 (three) times daily. 30 tablet 0   . polyethylene glycol (MIRALAX / GLYCOLAX) packet Take 17 g by mouth daily. (Patient taking differently: Take 17 g by mouth daily as needed for moderate constipation. ) 14 each 0 Past Week at Unknown time  . Prenatal Multivit-Min-Fe-FA (PRENATAL VITAMINS PO) Take 1 tablet by mouth daily.    02/07/2018 at Unknown time  . terconazole (TERAZOL 7) 0.4 % vaginal cream Place 1 applicator vaginally at bedtime. (Patient not taking: Reported on 02/08/2018) 45 g 0 Not Taking at Unknown time    Review of Systems  Constitutional: Negative for fever.  Gastrointestinal: Negative for nausea and vomiting.       Heartburn  Genitourinary: Negative for dysuria, vaginal bleeding and vaginal discharge.   Physical Exam   Blood pressure 114/67, pulse (!) 106, temperature 98.1 F (36.7 C), temperature source Oral, resp. rate 18, height 5' 2" (1.575 m), weight 128.4 kg, last menstrual period 10/27/2017, SpO2 99 %.  Physical Exam  Nursing note and vitals reviewed. Constitutional: She is oriented to person, place, and time. She appears well-developed and well-nourished.  HENT:  Head: Normocephalic.  Eyes: EOM are normal.  Neck: Neck supple.  GI: Soft. There is no tenderness. There is no rebound and no guarding.  Obese and large FHT  heard with doppler  Genitourinary:  Genitourinary Comments: Speculum exam: Vagina - Mod amount of white discharge, some bubbles seen, no odor Cervix - Unable to visualize with speculum Bimanual exam:  Very gentle bimanual done Cervix closed, cerclage in place, large amount of stool palpated in the rectum, no blood with the exam GC/Chlam, wet prep done Chaperone present for exam.   Musculoskeletal: Normal range of motion.    Neurological: She is alert and oriented to person, place, and time.  Skin: Skin is warm and dry.  Psychiatric: She has a normal mood and affect.   Patient Active Problem List   Diagnosis Date Noted  . Heartburn in pregnancy 02/13/2018  . Trichomonal vaginitis during pregnancy 01/20/2018  . Supervision of high risk pregnancy, antepartum 01/01/2018  . Pre-existing type 2 diabetes mellitus during pregnancy, antepartum 12/16/2017  . Type 2 diabetes mellitus (HCC) 12/15/2017  . Vitamin D deficiency 12/10/2017  . Constipation 05/30/2017  . MDD (major depressive disorder), recurrent episode, severe (HCC) 05/29/2017  . Incompetent cervix 09/12/2016  . Gall stones 07/19/2016  . Family history of breast cancer in female 06/06/2016  . Tinea pedis, recurrent 08/08/2015  . Morbidly obese (HCC) 10/25/2014  . Eczema 10/25/2014  . 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 Chronic headaches -  no meds 10/25/2014  . Anxiety and depression 11/30/2013  . HPV in female 05/08/2013  . PCOS (polycystic ovarian syndrome) 04/20/2013     MAU Course  Procedures Results for orders placed or performed during the hospital encounter of 02/13/18 (from the past 24 hour(s))  Wet prep, genital     Status: Abnormal   Collection Time: 02/13/18 12:46 PM  Result Value Ref Range   Yeast Wet Prep HPF POC NONE SEEN NONE SEEN   Trich, Wet Prep PRESENT (A) NONE SEEN   Clue Cells Wet Prep HPF POC NONE SEEN NONE SEEN    WBC, Wet Prep HPF POC MANY (A) NONE SEEN   Sperm NONE SEEN   Urinalysis, Routine w reflex microscopic     Status: Abnormal   Collection Time: 02/13/18 12:53 PM  Result Value Ref Range   Color, Urine YELLOW YELLOW   APPearance CLOUDY (A) CLEAR   Specific Gravity, Urine 1.019 1.005 - 1.030   pH 6.0 5.0 - 8.0   Glucose, UA 50 (A) NEGATIVE mg/dL   Hgb urine dipstick NEGATIVE NEGATIVE   Bilirubin Urine NEGATIVE NEGATIVE   Ketones, ur 5 (A) NEGATIVE mg/dL   Protein, ur NEGATIVE NEGATIVE mg/dL   Nitrite NEGATIVE NEGATIVE   Leukocytes, UA MODERATE (A) NEGATIVE   RBC / HPF 0-5 0 - 5 RBC/hpf   WBC, UA 6-10 0 - 5 WBC/hpf   Bacteria, UA FEW (A) NONE SEEN   Squamous Epithelial / LPF 11-20 0 - 5   Mucus PRESENT     MDM Unable to visualize cervix likely due to large amount of stool in rectum.  Talked with client about continuing to monitor the pressure she is feeling but today that pressure seems normal.  The baby is not pressing down and her cervix is closed with the cerclage intact.  Advised to drink  64 ounces of water daily.  Assessment and Plan  [redacted]w[redacted]d with cerclage and history of 2 very preterm, previable births Trichomonas - second time in pregnancy Heartburn  Plan Will prescribe zantac for heartburn - given one dose of pepcid in MAU Will prescribe Tindamax 2 gm as single dose as metronidazole bid for one week did not clear trichomonas. Continue medication for Type 2 diabetes in pregnancy. Continue your blood sugar diary and bring to your next appointment. Keep your appointment in the office on Monday.  Terri L Burleson 02/13/2018, 12:50 PM  

## 2018-02-14 LAB — GC/CHLAMYDIA PROBE AMP (~~LOC~~) NOT AT ARMC
Chlamydia: NEGATIVE
Neisseria Gonorrhea: NEGATIVE

## 2018-02-14 IMAGING — US US OB TRANSVAGINAL
1 series · 15 of 28 positions shown · non-contrast
Comparison: None.

CLINICAL DATA: Pelvic pain affecting early pregnancy

EXAM:
TWIN OBSTETRIC <14WK US AND TRANSVAGINAL OB US

[Series 1: us ob transvaginal · 15 of 81 slices shown]
[im 1/81]
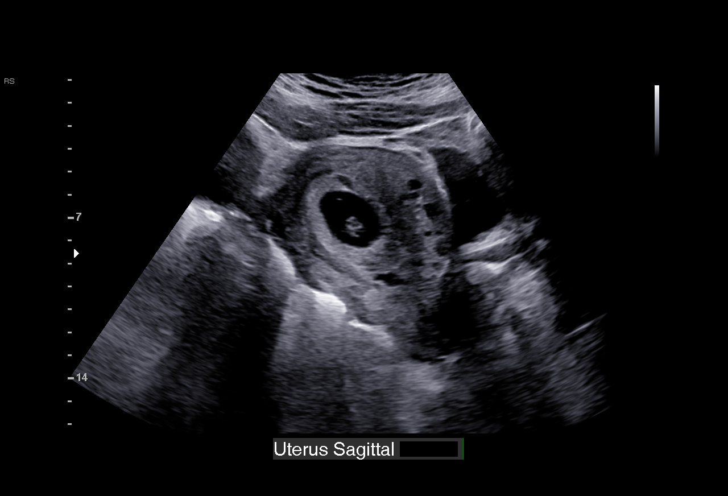
[im 6/81]
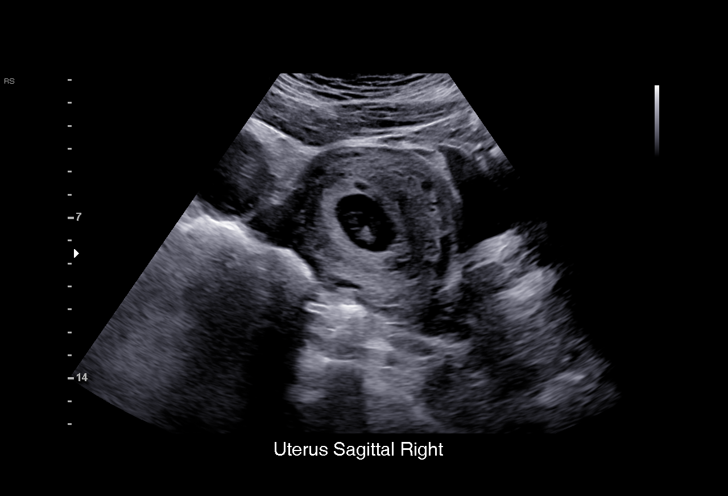
[im 12/81]
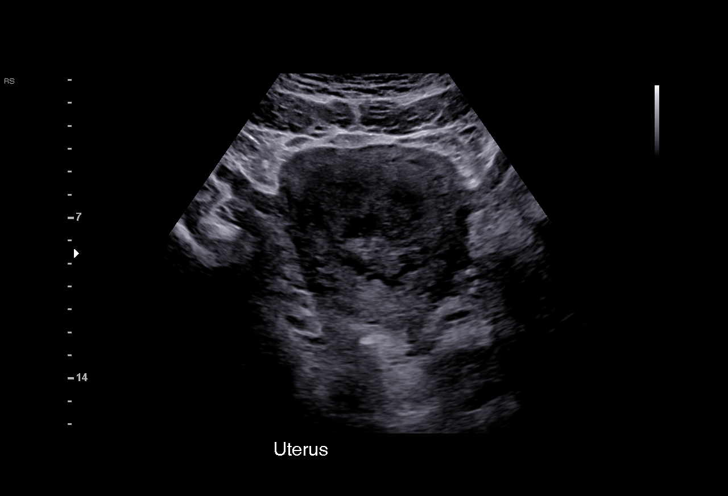
[im 18/81]
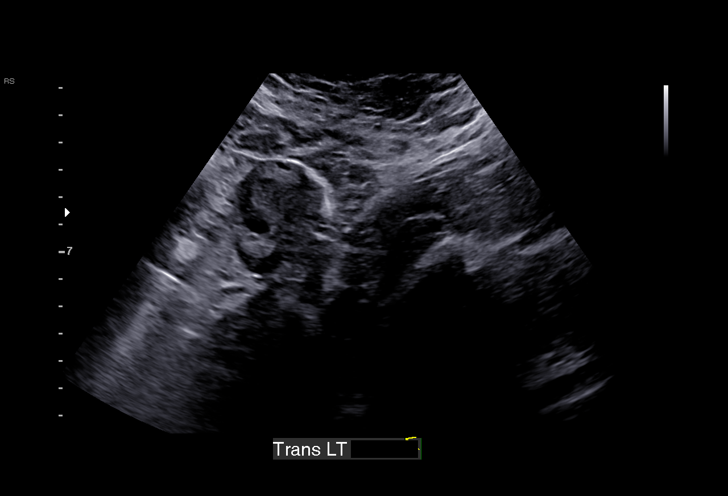
[im 24/81]
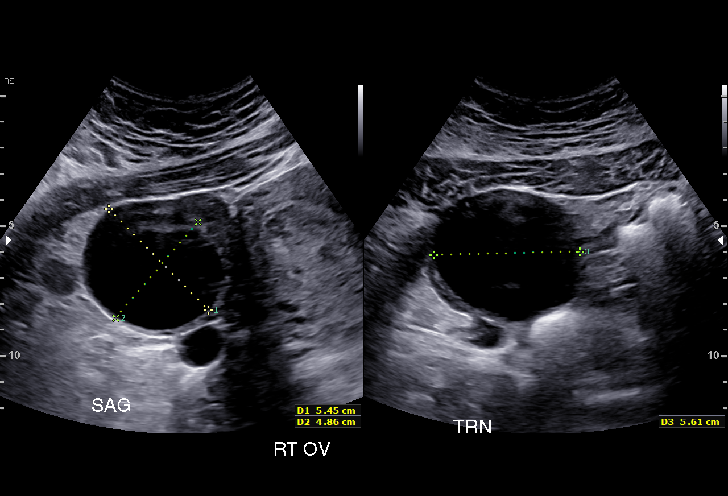
[im 30/81]
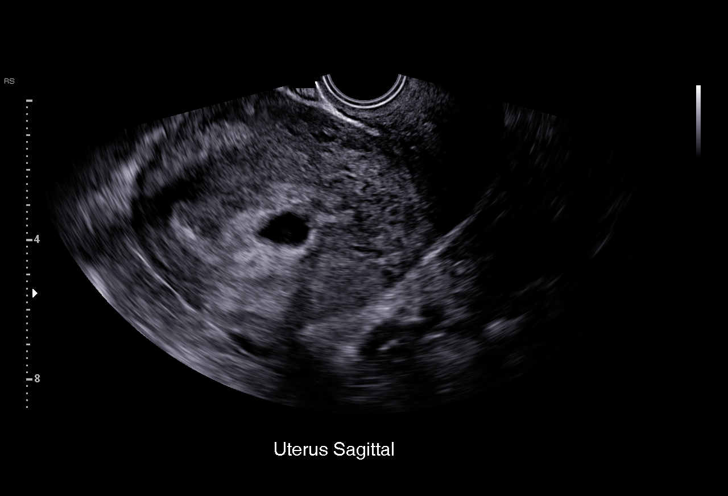
[im 36/81]
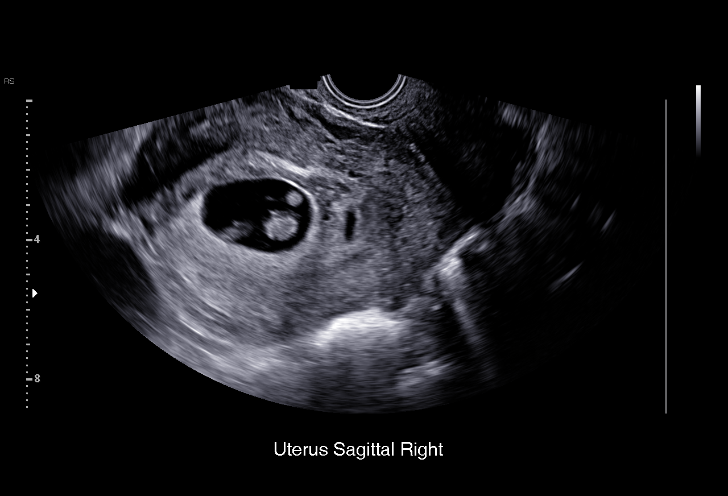
[im 42/81]
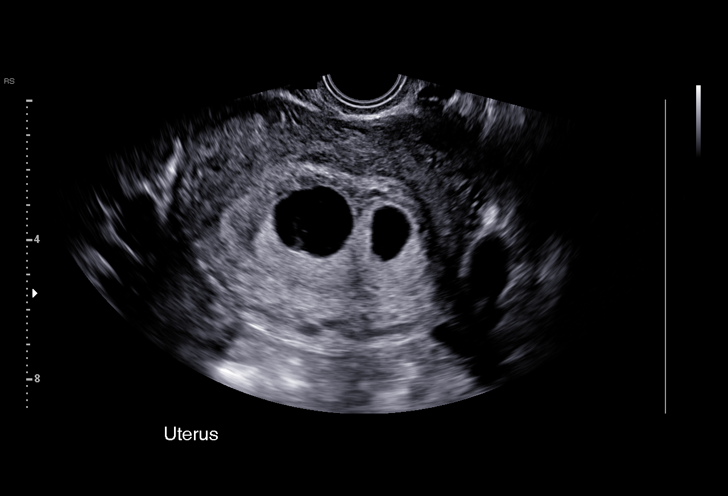
[im 45/81]
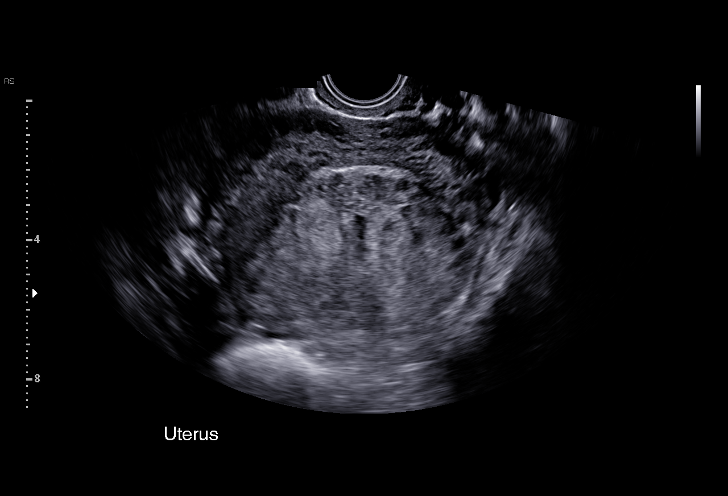
[im 51/81]
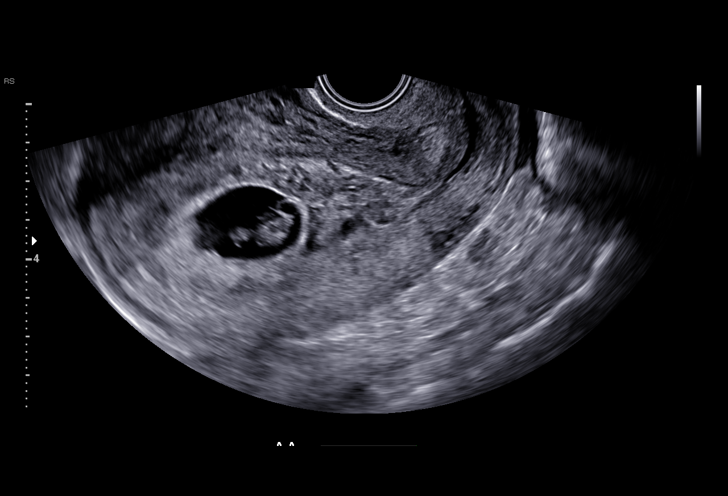
[im 57/81]
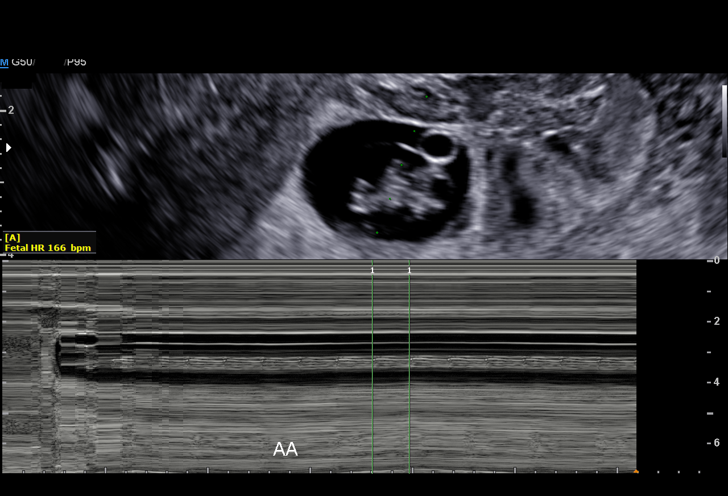
[im 63/81]
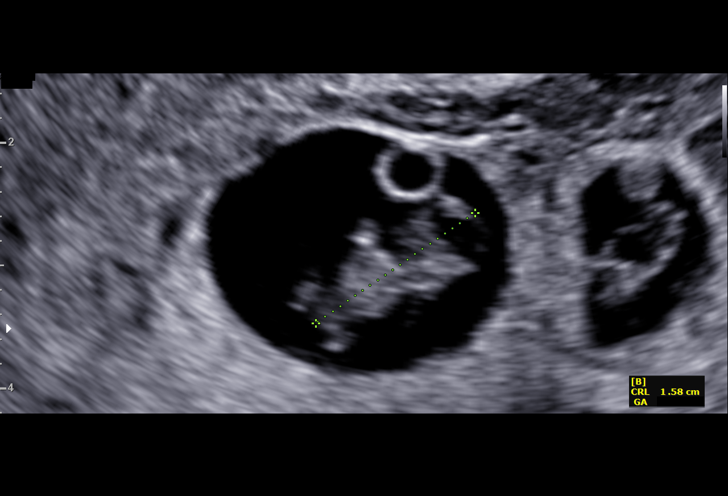
[im 69/81]
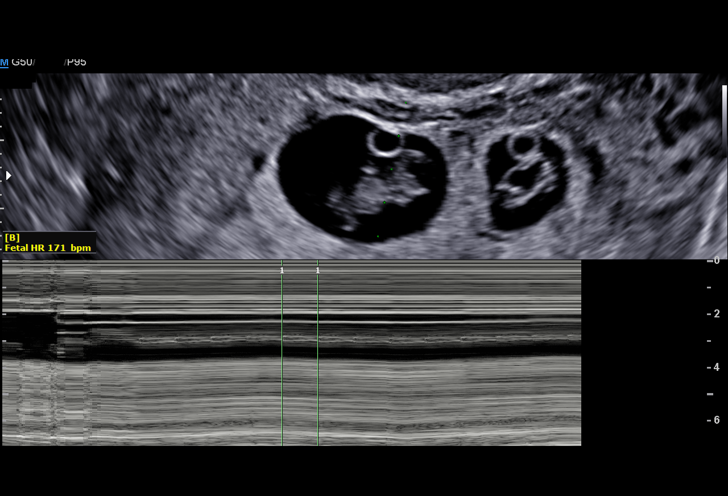
[im 75/81]
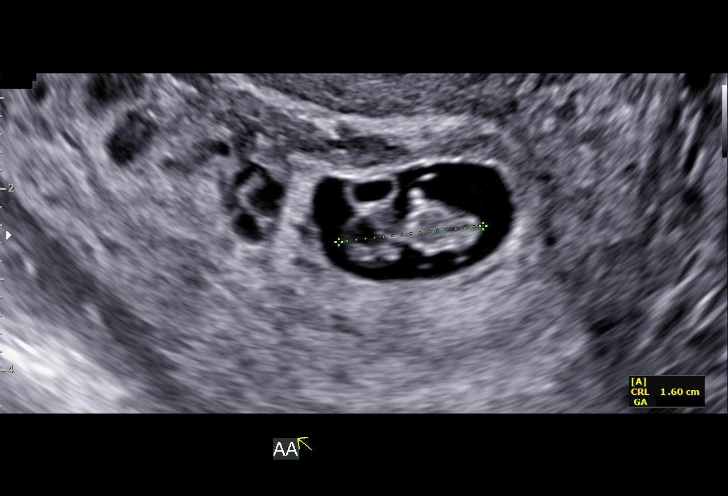
[im 81/81]
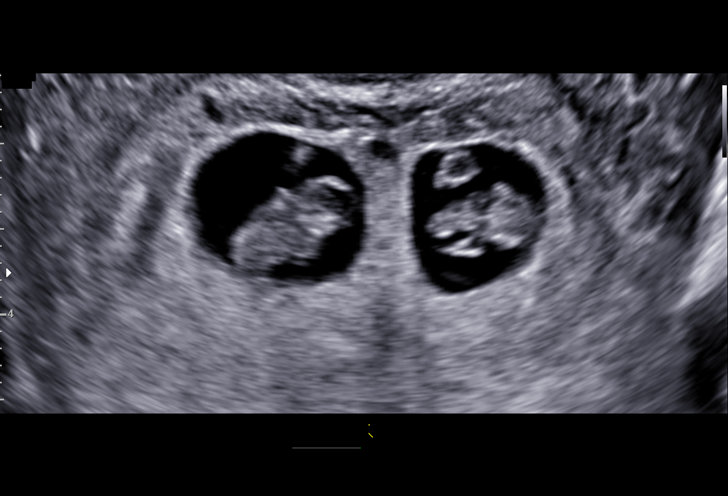

[15 of 28 positions shown; findings below may reference images not displayed]

FINDINGS: Number of IUPs:  2

Chorionicity/Amnionicity:  Dichorionic diamniotic

TWIN 1

Yolk sac:  Visualized

Embryo:  Visualized

Cardiac Activity: Visualized

Heart Rate: 166 bpm

MSD:   mm    w     d

CRL:  16.1  mm   8 w 0 d                  US EDC: 01/09/2017

TWIN 2

Yolk sac:  Visualized

Embryo:  Visualized

Cardiac Activity: Visualized

Heart Rate: 171 bpm

MSD:   mm    w     d

CRL:  16.0  mm   8 w 0 d                  US EDC: 01/09/2017

Subchorionic hemorrhage: Moderate subchorionic hemorrhage measuring
3.2 x 2.8 x 2.0 cm.

Maternal uterus/adnexae: There is a benign-appearing cyst in the
right ovary measuring 5.6 x 5.5 x 4.9 cm. No free fluid
IMPRESSION: Twin pregnancy, estimated gestational age 8 weeks 0 day.
Moderate-sized subchorionic hemorrhage.

Benign-appearing 5.6 cm right ovarian cyst.

## 2018-02-15 ENCOUNTER — Inpatient Hospital Stay (HOSPITAL_COMMUNITY)
Admission: AD | Admit: 2018-02-15 | Discharge: 2018-02-15 | Disposition: A | Discharge status: Home / Self Care | Payer: Medicaid Other | Source: Ambulatory Visit | Attending: Obstetrics & Gynecology | Admitting: Obstetrics & Gynecology

## 2018-02-15 ENCOUNTER — Encounter (HOSPITAL_COMMUNITY): Payer: Self-pay | Admitting: *Deleted

## 2018-02-15 DIAGNOSIS — O210 Mild hyperemesis gravidarum: Secondary | ICD-10-CM | POA: Insufficient documentation

## 2018-02-15 DIAGNOSIS — Z3A15 15 weeks gestation of pregnancy: Secondary | ICD-10-CM | POA: Insufficient documentation

## 2018-02-15 DIAGNOSIS — K529 Noninfective gastroenteritis and colitis, unspecified: Secondary | ICD-10-CM | POA: Diagnosis not present

## 2018-02-15 DIAGNOSIS — Z87891 Personal history of nicotine dependence: Secondary | ICD-10-CM | POA: Insufficient documentation

## 2018-02-15 DIAGNOSIS — O26892 Other specified pregnancy related conditions, second trimester: Secondary | ICD-10-CM

## 2018-02-15 DIAGNOSIS — O99612 Diseases of the digestive system complicating pregnancy, second trimester: Secondary | ICD-10-CM | POA: Diagnosis not present

## 2018-02-15 DIAGNOSIS — O09299 Supervision of pregnancy with other poor reproductive or obstetric history, unspecified trimester: Secondary | ICD-10-CM

## 2018-02-15 DIAGNOSIS — O219 Vomiting of pregnancy, unspecified: Secondary | ICD-10-CM

## 2018-02-15 LAB — URINALYSIS, ROUTINE W REFLEX MICROSCOPIC
BILIRUBIN URINE: NEGATIVE
Glucose, UA: NEGATIVE mg/dL
HGB URINE DIPSTICK: NEGATIVE
KETONES UR: 20 mg/dL — AB
NITRITE: NEGATIVE
PROTEIN: 30 mg/dL — AB
SPECIFIC GRAVITY, URINE: 1.025 (ref 1.005–1.030)
pH: 6 (ref 5.0–8.0)

## 2018-02-15 LAB — CBC
HCT: 42.2 % (ref 36.0–46.0)
Hemoglobin: 14.6 g/dL (ref 12.0–15.0)
MCH: 30.7 pg (ref 26.0–34.0)
MCHC: 34.6 g/dL (ref 30.0–36.0)
MCV: 88.8 fL (ref 78.0–100.0)
PLATELETS: 196 10*3/uL (ref 150–400)
RBC: 4.75 MIL/uL (ref 3.87–5.11)
RDW: 14.5 % (ref 11.5–15.5)
WBC: 8.5 10*3/uL (ref 4.0–10.5)

## 2018-02-15 LAB — COMPREHENSIVE METABOLIC PANEL
ALT: 24 U/L (ref 0–44)
AST: 34 U/L (ref 15–41)
Albumin: 3.6 g/dL (ref 3.5–5.0)
Alkaline Phosphatase: 54 U/L (ref 38–126)
Anion gap: 10 (ref 5–15)
BUN: 7 mg/dL (ref 6–20)
CHLORIDE: 104 mmol/L (ref 98–111)
CO2: 22 mmol/L (ref 22–32)
Calcium: 8.9 mg/dL (ref 8.9–10.3)
Creatinine, Ser: 0.57 mg/dL (ref 0.44–1.00)
GFR calc Af Amer: >60 mL/min (ref >60)
Glucose, Bld: 92 mg/dL (ref 70–99)
Potassium: 4.5 mmol/L (ref 3.5–5.1)
SODIUM: 136 mmol/L (ref 135–145)
TOTAL PROTEIN: 7 g/dL (ref 6.5–8.1)
Total Bilirubin: 1.2 mg/dL (ref 0.3–1.2)

## 2018-02-15 LAB — LIPASE, BLOOD: LIPASE: 24 U/L (ref 11–51)

## 2018-02-15 MED ORDER — IBUPROFEN 600 MG PO TABS
600.0000 mg | ORAL_TABLET | Freq: Once | ORAL | Status: AC
  Administered 2018-02-15: 600 mg via ORAL
  Filled 2018-02-15: qty 1

## 2018-02-15 MED ORDER — LACTATED RINGERS IV BOLUS
1000.0000 mL | Freq: Once | INTRAVENOUS | Status: AC
  Administered 2018-02-15: 1000 mL via INTRAVENOUS

## 2018-02-15 MED ORDER — PROMETHAZINE HCL 25 MG/ML IJ SOLN
25.0000 mg | Freq: Once | INTRAMUSCULAR | Status: AC
  Administered 2018-02-15: 25 mg via INTRAVENOUS
  Filled 2018-02-15: qty 1

## 2018-02-15 MED ORDER — FAMOTIDINE IN NACL 20-0.9 MG/50ML-% IV SOLN
20.0000 mg | Freq: Once | INTRAVENOUS | Status: AC
  Administered 2018-02-15: 20 mg via INTRAVENOUS
  Filled 2018-02-15: qty 50

## 2018-02-15 MED ORDER — SODIUM CHLORIDE 0.9 % IV SOLN
8.0000 mg | Freq: Once | INTRAVENOUS | Status: AC
  Administered 2018-02-15: 8 mg via INTRAVENOUS
  Filled 2018-02-15: qty 4

## 2018-02-15 MED ORDER — ONDANSETRON HCL 4 MG PO TABS: 4.0000 mg | ORAL_TABLET | Freq: Three times a day (TID) | ORAL | 0 refills | Status: DC | PRN

## 2018-02-15 MED ORDER — PROMETHAZINE HCL 25 MG PO TABS: 12.5000 mg | ORAL_TABLET | Freq: Four times a day (QID) | ORAL | 0 refills | Status: DC | PRN

## 2018-02-15 NOTE — MAU Note (Signed)
Traci Peterson is a 32 y.o. at 3860w6d here in MAU reporting: N/V and a headache. Onset of complaint: started last night. Pain score: 9/10 States unable to keep anything down. Blood sugar at noon was 95 Vitals:   02/15/18 1454  BP: 110/72  Pulse: (!) 130  Resp: 19  Temp: 98.1 F (36.7 C)  SpO2: 98%     FHT:169 Lab orders placed from triage: ua

## 2018-02-15 NOTE — MAU Provider Note (Addendum)
History     CSN: [id number]  Arrival date and time: 02/15/18 1352   First Provider Initiated Contact with Patient 02/15/18 1533      Chief Complaint  Patient presents with  . Emesis  . Headache   HPI   [name] is a 32 y.o. female G32P0200 @[redacted]w[redacted]d  with hx cervical incompetence in prior pregnancies with cerclage in place, here in MAU with a new onset of vomiting. She says she has had vomiting every hour since last night around midnight. No sick contacts. She did have a co-worker eat something similar to what she ate yesterday and is also having some vomiting. The last thing she ate was a ham and cheese sand which. She had one episode of diarrhea last night, however none since. She has not been able to keep anything down. She tried taking pepto bismuth which did not help. Patient with a history of Type 2 DM, checked her Bs this morning and it was 95.  Had constipation recently however has had normal BM's for the last week.    No abdominal pain, cramping, vaginal bleeding or LOF.   OB History    Gravida  3   Para  2   Term  0   Preterm  2   AB  0   Living  0     SAB  0   TAB  0   Ectopic  0   Multiple  1   Live Births  2           Past Medical History:  Diagnosis Date  . Anxiety    no current med.  . Cervix prolapsed into vagina   . Chronic headaches    otc med prn  . Constipation   . Depression    no meds currently  . Diabetes mellitus without complication ([place])    type 2  . Gall stones    Resolved with surgery  . GERD (gastroesophageal reflux disease)   . HPV in female   . Jaw snapping    states jaw pops if opens mouth too wide  . Ovarian cyst   . PCOS (polycystic ovarian syndrome)   . Pilonidal cyst 02/2013  . Preterm labor 2016   PPROM @ 18 wks, delivery of IUFD @ 21 wks  . Seasonal allergies   . SVD (spontaneous vaginal delivery)    x 2 - both fetal demises 2nd trim.  . Vaginal Pap smear, abnormal     Past Surgical History:   Procedure Laterality Date  . CERVICAL CERCLAGE N/A 01/21/2018   Procedure: CERCLAGE CERVICAL;  Surgeon: Guss Bunde, MD;  Location: [facility];  Service: Gynecology;  Laterality: N/A;  . CHOLECYSTECTOMY N/A 07/21/2016   Procedure: LAPAROSCOPIC CHOLECYSTECTOMY;  Surgeon: Coralie Keens, MD;  Location: [facility];  Service: General;  Laterality: N/A;  . PILONIDAL CYST EXCISION N/A 03/12/2013   Procedure: CYST EXCISION PILONIDAL EXTENSIVE;  Surgeon: Harl Bowie, MD;  Location: [facility];  Service: General;  Laterality: N/A;  . WISDOM TOOTH EXTRACTION      Family History  Problem Relation Age of Onset  . Hypertension Mother   . Hyperlipidemia Mother   . Diabetes Mother   . Thyroid disease Mother   . Diabetes Father   . Hypertension Father   . Alcohol abuse Father   . Breast cancer Maternal Aunt   . Lung cancer Paternal Grandfather     Social History   Tobacco Use  . Smoking  status: Former Smoker    Packs/day: 0.25    Years: 12.00    Pack years: 3.00    Types: Cigarettes    Last attempt to quit: 01/10/2018    Years since quitting: 0.0  . Smokeless tobacco: Never Used  Substance Use Topics  . Alcohol use: No    Frequency: Never    Comment: socially but none with pregnancy  . Drug use: Yes    Types: Hydrocodone    Comment: prescribed this medication    Allergies:  Allergies  Allergen Reactions  . Lactose Intolerance (Gi) Diarrhea    Medications Prior to Admission  Medication Sig Dispense Refill Last Dose  . ACCU-CHEK FASTCLIX LANCETS MISC 1 Units by Percutaneous route 4 (four) times daily. 100 each 12 02/15/2018 at Unknown time  . Blood Glucose Monitoring Suppl (ACCU-CHEK NANO SMARTVIEW) w/Device KIT 1 kit by Subdermal route as directed. Check blood sugars for fasting, and two hours after breakfast, lunch and dinner (4 checks daily) 1 kit 0 02/15/2018 at Unknown time  . Cholecalciferol (VITAMIN D3) 2000 units TABS Take 2,000 Units by mouth  daily.   02/14/2018 at Unknown time  . docusate sodium (COLACE) 100 MG capsule Take 1 capsule (100 mg total) by mouth every 12 (twelve) hours. (Patient taking differently: Take 200 mg by mouth daily as needed for moderate constipation. ) 60 capsule 0 Past Week at Unknown time  . glucose blood (ACCU-CHEK SMARTVIEW) test strip Use as instructed to check blood sugars 100 each 12 02/15/2018 at Unknown time  . HYDROcodone-acetaminophen (NORCO/VICODIN) 5-325 MG tablet Take 1-2 tablets by mouth every 6 (six) hours as needed. 8 tablet 0 Past Week at Unknown time  . metFORMIN (GLUCOPHAGE) 500 MG tablet Take 1 tablet (500 mg total) by mouth 2 (two) times daily with a meal. 60 tablet 1 02/14/2018 at Unknown time  . methocarbamol (ROBAXIN) 750 MG tablet Take 1 tablet (750 mg total) by mouth 3 (three) times daily. 30 tablet 0 Past Week at Unknown time  . polyethylene glycol (MIRALAX / GLYCOLAX) packet Take 17 g by mouth daily. (Patient taking differently: Take 17 g by mouth daily as needed for moderate constipation. ) 14 each 0 Past Week at Unknown time  . Prenatal Multivit-Min-Fe-FA (PRENATAL VITAMINS PO) Take 1 tablet by mouth daily.    02/14/2018 at Unknown time  . ranitidine (ZANTAC) 150 MG tablet Take 1 tablet (150 mg total) by mouth 2 (two) times daily. 60 tablet 2 02/15/2018 at Unknown time  . ibuprofen (ADVIL,MOTRIN) 600 MG tablet Take 1 tablet (600 mg total) by mouth every 6 (six) hours as needed. 30 tablet 0 More than a month at Unknown time   Results for orders placed or performed during the hospital encounter of 02/15/18 (from the past 48 hour(s))  Urinalysis, Routine w reflex microscopic     Status: Abnormal   Collection Time: 02/15/18  2:25 PM  Result Value Ref Range   Color, Urine AMBER (A) YELLOW    Comment: BIOCHEMICALS MAY BE AFFECTED BY COLOR   APPearance TURBID (A) CLEAR   Specific Gravity, Urine 1.025 1.005 - 1.030   pH 6.0 5.0 - 8.0   Glucose, UA NEGATIVE NEGATIVE mg/dL   Hgb urine dipstick  NEGATIVE NEGATIVE   Bilirubin Urine NEGATIVE NEGATIVE   Ketones, ur 20 (A) NEGATIVE mg/dL   Protein, ur 30 (A) NEGATIVE mg/dL   Nitrite NEGATIVE NEGATIVE   Leukocytes, UA SMALL (A) NEGATIVE   WBC, UA 21-50 0 - 5 WBC/hpf  Bacteria, UA FEW (A) NONE SEEN   Squamous Epithelial / LPF 21-50 0 - 5   Mucus PRESENT     Comment: Performed at [facility], [address], [place], [place] [address]  CBC     Status: None   Collection Time: 02/15/18  3:52 PM  Result Value Ref Range   WBC 8.5 4.0 - 10.5 K/uL   RBC 4.75 3.87 - 5.11 MIL/uL   Hemoglobin 14.6 12.0 - 15.0 g/dL   HCT 42.2 36.0 - 46.0 %   MCV 88.8 78.0 - 100.0 fL   MCH 30.7 26.0 - 34.0 pg   MCHC 34.6 30.0 - 36.0 g/dL   RDW 14.5 11.5 - 15.5 %   Platelets 196 150 - 400 K/uL    Comment: Performed at [facility], [address], [place], [place] [address]  Comprehensive metabolic panel     Status: None   Collection Time: 02/15/18  3:52 PM  Result Value Ref Range   Sodium 136 135 - 145 mmol/L   Potassium 4.5 3.5 - 5.1 mmol/L   Chloride 104 98 - 111 mmol/L   CO2 22 22 - 32 mmol/L   Glucose, Bld 92 70 - 99 mg/dL   BUN 7 6 - 20 mg/dL   Creatinine, Ser 0.57 0.44 - 1.00 mg/dL   Calcium 8.9 8.9 - 10.3 mg/dL   Total Protein 7.0 6.5 - 8.1 g/dL   Albumin 3.6 3.5 - 5.0 g/dL   AST 34 15 - 41 U/L   ALT 24 0 - 44 U/L   Alkaline Phosphatase 54 38 - 126 U/L   Total Bilirubin 1.2 0.3 - 1.2 mg/dL   GFR calc non Af Amer >60 >60 mL/min   GFR calc Af Amer >60 >60 mL/min    Comment: (NOTE) The eGFR has been calculated using the CKD EPI equation. This calculation has not been validated in all clinical situations. eGFR's persistently <60 mL/min signify possible Chronic Kidney Disease.    Anion gap 10 5 - 15    Comment: Performed at [facility], [address], [place], [place] [address]  Lipase, blood     Status: None   Collection Time: 02/15/18  3:52 PM  Result Value Ref Range   Lipase 24 11 - 51 U/L    Comment: Performed at  [facility], [address], [place], [place] [address]    Review of Systems  Constitutional: Negative for fever.  Gastrointestinal: Positive for nausea and vomiting. Negative for abdominal pain, constipation and diarrhea.  Genitourinary: Negative for dysuria, flank pain, vaginal bleeding and vaginal discharge.  Musculoskeletal: Negative for back pain.   Physical Exam   Blood pressure 110/72, pulse (!) 111, temperature 98.1 F (36.7 C), temperature source Oral, resp. rate 19, weight 125.7 kg, last menstrual period 10/27/2017, SpO2 98 %.  Physical Exam  Constitutional: She is oriented to person, place, and time. She appears well-developed and well-nourished.  Non-toxic appearance. She has a sickly appearance. She does not appear ill. No distress.  HENT:  Head: Normocephalic.  GI: Soft. Normal appearance and bowel sounds are normal. She exhibits no distension. There is no tenderness. There is no rebound and no CVA tenderness.  Musculoskeletal: Normal range of motion.  Neurological: She is alert and oriented to person, place, and time.  Skin: Skin is warm. She is not diaphoretic.  Psychiatric: Her behavior is normal.    MAU Course  Procedures  None  MDM  Patient actively vomiting  in mau at arrival.  +  fetal hear tones via doppler  CBC with diff CMP Lipase Urine culture sent   Maternal pulse 111, patient without further active vomiting. Will give 1 additional liter of fluid.   Lr bolus x2 In MAU  Phenergan 25 mg IV Report given to Fatima Blank CNM who resumes care of the patient  Rasch, Artist Pais, NP 02/15/2018 8:04 PM  MDM: Reviewed lab results, no elevated WBCs, CMP normal without electrolyte imbalance, anion gap wnl so no indication of DKA with pt hx of DM.  Pt doing well after 2 liters of fluid and antiemetics. Tolerating PO food/fluids in MAU.  Likely food bourne gastroenteritis caused pt symptoms.  Rx for Zofran and Phenergan for use PRN if symptoms  return.  No abdominal pain, cramping, vaginal bleeding or LOF.  D/C home to f/u as scheduled in office, return if symptoms persist or worsen. Return to MAU with any emergencies.      Assessment and Plan   1. Gastroenteritis, acute   2. Nausea/vomiting in pregnancy   3. Pregnancy with poor obstetric history     D/C home Rx for Zofran and Phenergan  Fatima Blank, CNM 9:14 PM

## 2018-02-15 NOTE — Progress Notes (Signed)
Written and verbal d/c instructions given and understanding voiced. 

## 2018-02-15 NOTE — MAU Note (Signed)
Urine in lab 

## 2018-02-17 ENCOUNTER — Ambulatory Visit (INDEPENDENT_AMBULATORY_CARE_PROVIDER_SITE_OTHER): Payer: Medicaid Other | Admitting: Nurse Practitioner

## 2018-02-17 VITALS — BP 115/76 | HR 90 | Wt 279.0 lb

## 2018-02-17 DIAGNOSIS — E119 Type 2 diabetes mellitus without complications: Secondary | ICD-10-CM

## 2018-02-17 DIAGNOSIS — N883 Incompetence of cervix uteri: Secondary | ICD-10-CM

## 2018-02-17 DIAGNOSIS — O0990 Supervision of high risk pregnancy, unspecified, unspecified trimester: Secondary | ICD-10-CM

## 2018-02-17 LAB — CULTURE, OB URINE: SPECIAL REQUESTS: NORMAL

## 2018-02-17 MED ORDER — METRONIDAZOLE 500 MG PO TABS: 500.0000 mg | ORAL_TABLET | Freq: Two times a day (BID) | ORAL | 0 refills | Status: DC

## 2018-02-17 NOTE — Progress Notes (Signed)
Pt states she is having issues with her insurance and hasn't been able to get Makena. She is working on fixing it.

## 2018-02-17 NOTE — Progress Notes (Signed)
Subjective:  Traci Peterson is a 32 y.o. G3P0200 at [redacted]w[redacted]d being seen today for ongoing prenatal care.  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  Patient reports no problems today but was in MAU on Thursday and again on Saturday (vomiting).  Contractions: Not present. Vag. Bleeding: None.  Movement: Absent. Denies leaking of fluid. No vomiting today.  Works at night - ate meal at 3 am so unsure if blood sugar this AM is truly fasing - 107.  Did not bring diary of blood sugars with her.  Planning to change shifts at work and will be working 8-5.    The following portions of the patient's history were reviewed and updated as appropriate: allergies, current medications, past family history, past medical history, past social history, past surgical history and problem list. Problem list updated.  Objective:   Vitals:   02/17/18 0902  BP: 115/76  Pulse: 90  Weight: 279 lb (126.6 kg)    Fetal Status: Fetal Heart Rate (bpm): 157 Fundal Height: 33 cm Movement: Absent     General:  Alert, oriented and cooperative. Patient is in no acute distress.  Skin: Skin is warm and dry. No rash noted.   Cardiovascular: Normal heart rate noted  Respiratory: Normal respiratory effort, no problems  with respiration noted  Abdomen: Soft, gravid, appropriate for gestational age. Pain/Pressure: Absent     Pelvic:  Cervical exam deferred        Extremities: Normal range of motion.  Edema: Trace  Mental Status: Normal mood and affect. Normal behavior. Normal judgment and thought content.   Urinalysis:      Assessment and Plan:  Pregnancy: G3P0200 at [redacted]w[redacted]d  1. Supervision of high risk pregnancy, antepartum Ordered detailed anatomy scan for 19 weeks. Measures size greater than dates but likely is due to morbid obesity.  2. Incompetent cervix Has cerclage. No bleeding, no cramping, no leaking.  Advised not to have sex. Advised to return in one week to see about getting her 17P - will need to have insurance updated - see below.  3. Type 2 diabetes mellitus without complication, without long-term current use of insulin (HCC) Bring your diary of blood sugar checks with you to a visit - did not bring today.  Reports sugars of 95-107 fasting and 135 after a meal.  May actually need to have metformin increased but since she had vomiting on Saturday and did not bring blood sugar record with her, will defer this until the next visit.    4.  Trichomonas Did not get medication for Trichomonas ordered on last Thursday due to a problem with her Medicaid - it was not approved by [facility] - pharmacy told her there were several other insurances listed in addition to Medicaid and her medication was not filled.  She actually does not have other insurance but will need to see DSS to have the insurance she  no longer has removed and update her status at DSS. Until that time, prescribed Metformin 500 mg BID x 7 days again for her.  Preterm labor symptoms and general obstetric precautions including but not limited to vaginal bleeding, contractions, leaking of fluid and fetal movement were reviewed in detail with the patient. Please refer to After Visit Summary for other counseling recommendations.  Return in  about 1 week (around 02/24/2018) [facility]  Nolene BernheimERRI Alexsandra Shontz, RN, MSN, NP-BC Nurse Practitioner, [facility] for [facility], [facility] 02/17/2018 1:43 PM

## 2018-02-20 ENCOUNTER — Ambulatory Visit (HOSPITAL_COMMUNITY): Payer: Medicaid Other

## 2018-02-24 ENCOUNTER — Ambulatory Visit (INDEPENDENT_AMBULATORY_CARE_PROVIDER_SITE_OTHER): Payer: Medicaid Other | Admitting: Obstetrics & Gynecology

## 2018-02-24 DIAGNOSIS — O0990 Supervision of high risk pregnancy, unspecified, unspecified trimester: Secondary | ICD-10-CM

## 2018-02-24 DIAGNOSIS — O09212 Supervision of pregnancy with history of pre-term labor, second trimester: Secondary | ICD-10-CM

## 2018-02-24 LAB — GLUCOSE, POCT (MANUAL RESULT ENTRY): POC Glucose: 133 mg/dl — AB (ref 70–99)

## 2018-02-24 MED ORDER — HYDROXYPROGESTERONE CAPROATE 275 MG/1.1ML ~~LOC~~ SOAJ
275.0000 mg | SUBCUTANEOUS | Status: AC
  Administered 2018-02-24 – 2018-06-09 (×8): 275 mg via SUBCUTANEOUS

## 2018-02-24 NOTE — Progress Notes (Signed)
Pt did not bring CBG's and has not been putting in BRX app    PRENATAL VISIT NOTE  Subjective:  Traci Peterson is a 32 y.o. G3P0200 at [redacted]w[redacted]d being seen today for ongoing prenatal care.  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  Patient reports constipaton but not severe.  Not recording CBGs..  Contractions: Not present. Vag. Bleeding: None.  Movement: Present. Denies leaking of fluid.   The following portions of the patient's history were reviewed and updated as appropriate: allergies, current medications, past family history, past medical history, past social history, past surgical history and problem list. Problem list updated.  Objective:   Vitals:   02/24/18 1433  BP: 113/60  Pulse: 92  Weight: 282 lb (127.9 kg)    Fetal Status: Fetal Heart Rate (bpm): 161   Movement: Present     General:  Alert, oriented and cooperative. Patient is in no acute distress.  Skin: Skin is warm and dry. No rash noted.   Cardiovascular: Normal heart rate noted  Respiratory: Normal respiratory effort, no problems with respiration noted  Abdomen: Soft, gravid, appropriate for gestational age.  Pain/Pressure: Absent     Pelvic: Cervical exam performed Dilation: Closed Effacement (%): 0  Station: Ballotable  Extremities: Normal range of motion.  Edema: None  Mental Status: Normal mood and affect. Normal behavior. Normal judgment and thought content.   Assessment and Plan:  Pregnancy: G3P0200 at [redacted]w[redacted]d  1. Supervision of high risk pregnancy, antepartum -per patient:  fastings mostly 95-100; pp are mostly in 130s.   - POCT Glucose (CBG) -Start Lantus 10 units QHS; expressed extreme importantce of recording CBGs in baby RX app--we can have more surveillance of her values.  -Watch diet and weight gain  2.  Genetic screening--patient left before confirming genetic testing.   RN to call  3.  Flu shot   4.  Recurrent boils ob perineum and axilla--probably hydrantinitis suppurativa.  No fluctuant areas.  One resolving area on right inner thigh.  Pt to contact immediately if enlarges or becomes painful again.  5.  Cerclage in place and cerix closed.  Pt started [facility] today.  6.  Pt still has not gotten trich treated--insurance reasons.  Pt will pick up Rx today.  TOC in 3 weeks.  No sex until TOC.  Partner may need to be treated again before intercourse.  7.  Anatomy US and cervical length by MFM  Preterm labor symptoms and general obstetric precautions including but not limited to vaginal bleeding, contractions, leaking of fluid and fetal movement were reviewed in detail with the patient. Please refer to After Visit Summary for other counseling recommendations.  Return in about 1 week (around 03/03/2018).  Future Appointments  Date Time Provider Department Center  03/03/2018  8:30 AM Allie Bossier, MD [facility]  03/03/2018  1:30 PM [facility]-MFC MD [facility]  03/10/2018  2:00 PM WH-MFC  [place] 3 [facility]-MFCUS MFC-US  03/10/2018  3:00 PM Lesly Dukes, MD [facility]  03/17/2018  8:15 AM Lesly Dukes, MD [facility]  03/24/2018  7:00 AM Allie Bossier, MD [facility]    [name], MD

## 2018-02-25 MED ORDER — INSULIN GLARGINE 100 UNIT/ML ~~LOC~~ SOLN: 10.0000 [IU] | Freq: Every day | SUBCUTANEOUS | 11 refills | Status: DC

## 2018-03-03 ENCOUNTER — Ambulatory Visit: Payer: Self-pay

## 2018-03-03 ENCOUNTER — Encounter: Payer: Self-pay | Admitting: Obstetrics & Gynecology

## 2018-03-03 ENCOUNTER — Ambulatory Visit (INDEPENDENT_AMBULATORY_CARE_PROVIDER_SITE_OTHER): Payer: Medicaid Other | Admitting: Obstetrics & Gynecology

## 2018-03-03 ENCOUNTER — Ambulatory Visit (HOSPITAL_COMMUNITY): Admission: RE | Admit: 2018-03-03 | Payer: Medicaid Other | Source: Ambulatory Visit

## 2018-03-03 VITALS — BP 100/66 | HR 90 | Wt 284.0 lb

## 2018-03-03 DIAGNOSIS — Z23 Encounter for immunization: Secondary | ICD-10-CM | POA: Diagnosis not present

## 2018-03-03 DIAGNOSIS — O3432 Maternal care for cervical incompetence, second trimester: Secondary | ICD-10-CM

## 2018-03-03 DIAGNOSIS — O0992 Supervision of high risk pregnancy, unspecified, second trimester: Secondary | ICD-10-CM

## 2018-03-03 DIAGNOSIS — O24112 Pre-existing diabetes mellitus, type 2, in pregnancy, second trimester: Secondary | ICD-10-CM

## 2018-03-03 DIAGNOSIS — O0990 Supervision of high risk pregnancy, unspecified, unspecified trimester: Secondary | ICD-10-CM

## 2018-03-03 DIAGNOSIS — O24119 Pre-existing diabetes mellitus, type 2, in pregnancy, unspecified trimester: Secondary | ICD-10-CM

## 2018-03-03 DIAGNOSIS — N883 Incompetence of cervix uteri: Secondary | ICD-10-CM

## 2018-03-03 DIAGNOSIS — M7989 Other specified soft tissue disorders: Secondary | ICD-10-CM

## 2018-03-03 DIAGNOSIS — E119 Type 2 diabetes mellitus without complications: Secondary | ICD-10-CM

## 2018-03-03 NOTE — Progress Notes (Signed)
   PRENATAL VISIT NOTE  Subjective:  Traci Peterson is a 32 y.o. G3P0200 at [redacted]w[redacted]d being seen today for ongoing prenatal care.  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  Patient reports no complaints.  Contractions: Not present. Vag. Bleeding: None.  Movement: Absent. Denies leaking of fluid.   The following portions of the patient's history were reviewed and updated as appropriate: allergies, current medications, past family history, past medical history, past social history, past surgical history and problem list. Problem list updated.  Objective:   Vitals:   03/03/18 0934  BP: 100/66  Pulse: 90  Weight: 284 lb (128.8 kg)    Fetal Status:     Movement: Absent     General:  Alert, oriented and cooperative. Patient is in no acute distress.  Skin: Skin is warm and dry. No rash noted.   Cardiovascular: Normal heart rate noted  Respiratory: Normal respiratory effort, no problems with respiration noted  Abdomen: Soft, gravid, appropriate for gestational age.  Pain/Pressure: Absent     Pelvic: Cervical exam deferred        Extremities: Normal range of motion.  Edema: Mild pitting, slight indentation  Mental Status: Normal mood and affect. Normal behavior. Normal judgment and  thought content.   Assessment and Plan:  Pregnancy: G3P0200 at [redacted]w[redacted]d  1. Incompetent cervix -weekly 17-P, cerclage, on pelvic rest  2. Supervision of high risk pregnancy, antepartum - MFM u/s next Monday  4. Pre-existing type 2 diabetes mellitus during pregnancy, antepartum - sugars are excellent, she never started the insulin - she is taking metformin 1000mg  BID  Preterm labor symptoms and general obstetric precautions including but not limited to vaginal bleeding, contractions, leaking of fluid and fetal movement were reviewed in detail with the patient. Please refer to After Visit Summary for other counseling recommendations.  Return for weekly 17-P.  Future Appointments  Date Time Provider Department Center  03/10/2018  2:00 PM WH-MFC [place] 3 [facility]-M[facility]US MFC-US  03/10/2018  3:00 PM Lesly Dukes, MD [facility]  03/17/2018  8:15 AM Lesly Dukes, MD [facility]  03/24/2018  7:00 AM Allie Bossier, MD [facility]    Allie Bossier, MD

## 2018-03-05 ENCOUNTER — Ambulatory Visit: Payer: Self-pay | Admitting: Internal Medicine

## 2018-03-10 ENCOUNTER — Encounter: Payer: Medicaid Other | Admitting: Obstetrics & Gynecology

## 2018-03-10 ENCOUNTER — Encounter (HOSPITAL_COMMUNITY): Payer: Self-pay

## 2018-03-10 ENCOUNTER — Other Ambulatory Visit: Payer: Self-pay | Admitting: Nurse Practitioner

## 2018-03-10 ENCOUNTER — Ambulatory Visit (HOSPITAL_COMMUNITY)
Admission: RE | Admit: 2018-03-10 | Discharge: 2018-03-10 | Disposition: A | Discharge status: Home / Self Care | Payer: Medicaid Other | Source: Ambulatory Visit | Attending: Nurse Practitioner | Admitting: Nurse Practitioner

## 2018-03-10 DIAGNOSIS — O09212 Supervision of pregnancy with history of pre-term labor, second trimester: Secondary | ICD-10-CM | POA: Diagnosis not present

## 2018-03-10 DIAGNOSIS — O24112 Pre-existing diabetes mellitus, type 2, in pregnancy, second trimester: Secondary | ICD-10-CM | POA: Insufficient documentation

## 2018-03-10 DIAGNOSIS — Z363 Encounter for antenatal screening for malformations: Secondary | ICD-10-CM | POA: Insufficient documentation

## 2018-03-10 DIAGNOSIS — O0990 Supervision of high risk pregnancy, unspecified, unspecified trimester: Secondary | ICD-10-CM

## 2018-03-10 DIAGNOSIS — O99212 Obesity complicating pregnancy, second trimester: Secondary | ICD-10-CM | POA: Insufficient documentation

## 2018-03-10 DIAGNOSIS — O3432 Maternal care for cervical incompetence, second trimester: Secondary | ICD-10-CM | POA: Insufficient documentation

## 2018-03-10 DIAGNOSIS — O23591 Infection of other part of genital tract in pregnancy, first trimester: Secondary | ICD-10-CM

## 2018-03-10 DIAGNOSIS — Z3686 Encounter for antenatal screening for cervical length: Secondary | ICD-10-CM | POA: Diagnosis not present

## 2018-03-10 DIAGNOSIS — A5901 Trichomonal vulvovaginitis: Secondary | ICD-10-CM

## 2018-03-10 DIAGNOSIS — Z3A19 19 weeks gestation of pregnancy: Secondary | ICD-10-CM

## 2018-03-11 ENCOUNTER — Encounter: Payer: Self-pay | Admitting: Certified Nurse Midwife

## 2018-03-11 ENCOUNTER — Ambulatory Visit (INDEPENDENT_AMBULATORY_CARE_PROVIDER_SITE_OTHER): Payer: Medicaid Other | Admitting: Certified Nurse Midwife

## 2018-03-11 VITALS — BP 109/65 | HR 97 | Wt 284.0 lb

## 2018-03-11 DIAGNOSIS — O26892 Other specified pregnancy related conditions, second trimester: Secondary | ICD-10-CM

## 2018-03-11 DIAGNOSIS — O3432 Maternal care for cervical incompetence, second trimester: Secondary | ICD-10-CM | POA: Diagnosis not present

## 2018-03-11 DIAGNOSIS — N883 Incompetence of cervix uteri: Secondary | ICD-10-CM

## 2018-03-11 DIAGNOSIS — R12 Heartburn: Secondary | ICD-10-CM

## 2018-03-11 DIAGNOSIS — O0990 Supervision of high risk pregnancy, unspecified, unspecified trimester: Secondary | ICD-10-CM

## 2018-03-11 DIAGNOSIS — O24119 Pre-existing diabetes mellitus, type 2, in pregnancy, unspecified trimester: Secondary | ICD-10-CM

## 2018-03-11 LAB — GLUCOSE, POCT (MANUAL RESULT ENTRY): POC Glucose: 127 mg/dl — AB (ref 70–99)

## 2018-03-11 MED ORDER — COMFORT FIT MATERNITY SUPP LG MISC: 1.0000 [IU] | Freq: Every day | 0 refills | Status: DC

## 2018-03-11 MED ORDER — FAMOTIDINE 20 MG PO TABS: 20.0000 mg | ORAL_TABLET | Freq: Two times a day (BID) | ORAL | 2 refills | Status: DC

## 2018-03-11 NOTE — Patient Instructions (Signed)
Take prescription for maternity support belt to [facility] in [place]  [address] [place] [address] [phone]

## 2018-03-11 NOTE — Progress Notes (Signed)
PRENATAL VISIT NOTE  Subjective:  Traci Peterson is a 32 y.o. G3P0200 at [redacted]w[redacted]d being seen today for ongoing prenatal care.  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  Patient reports heartburn.  Contractions: Not present. Vag. Bleeding: None.  Movement: Present. Denies leaking of fluid.   The following portions of the patient's history were reviewed and updated as appropriate: allergies, current medications, past family history, past medical history, past social history, past surgical history and problem list. Problem list updated.  Objective:   Vitals:   03/11/18 0852  BP: 109/65  Pulse: 97  Weight: 284 lb (128.8 kg)    Fetal Status: Fetal Heart Rate (bpm): 159   Movement: Present     General:  Alert, oriented and cooperative. Patient is in no acute distress.  Skin: Skin is warm and dry. No rash noted.   Cardiovascular: Normal heart rate noted  Respiratory: Normal respiratory effort, no problems with respiration noted  Abdomen: Soft, gravid, appropriate for gestational age.  Pain/Pressure: Absent     Pelvic: Cervical exam deferred        Extremities: Normal range of motion.  Edema: Trace  Mental Status: Normal mood and affect. Normal behavior. Normal judgment and  thought content.   Assessment and Plan:  Pregnancy: G3P0200 at [redacted]w[redacted]d  1. Supervision of high risk pregnancy, antepartum - Patient doing well, complains of continued heartburn and round ligament pain  - Maternity support belt Rx given and information on [employer] supply to have belt fitted  - [place] reviewed with patient, anticipatory guidance with serial growth every 4 weeks, Fetal ECHO with Pediatric Cardiology at 22 weeks and Weekly BPP starting at 32 weeks.  - POCT Glucose (CBG) - Elastic Bandages & Supports (COMFORT FIT MATERNITY SUPP LG) MISC; 1 Units by Does not apply route daily.  Dispense: 1 each; Refill: 0 - [place] MFM OB FOLLOW UP; Future - US FETAL ECHOCARDIOGRAPHY, Future   2. Incompetent cervix - Cerclage in placed  - Transvaginal US performed yesterday noted CL of 3.76cm - She denies bleeding, contractions or cramping - Reviewed pelvic rest   3. Pre-existing type 2 diabetes mellitus during pregnancy, antepartum - Patient forgot to bring in log due to just getting off of work - Reports fasting sugars have not been over 95 and postprandial have not been over 113 - Currently not on insulin  - POCT glucose 127 this morning, reports eating at work around 3am   4. Heartburn during pregnancy in second trimester - Has been taking Zantac with no relief of heartburn  - famotidine (PEPCID) 20 MG tablet; Take 1 tablet (20 mg total) by mouth 2 (two) times daily.  Dispense: 30 tablet; Refill: 2  Preterm labor symptoms and general obstetric precautions including but not limited to vaginal bleeding, contractions, leaking of fluid and fetal movement were reviewed in detail with the patient. Please refer to After Visit Summary  for other counseling recommendations.  Return weekly 17P.  Future Appointments  Date Time Provider Department Center  03/17/2018  8:15 AM Lesly Dukes, MD [facility]  03/24/2018  7:00 AM Allie Bossier, MD [facility]  04/14/2018  7:45 AM WH-MFC  [place] 2 WH-MFCUS MFC-US    Sharyon Cable, CNM

## 2018-03-14 ENCOUNTER — Other Ambulatory Visit (HOSPITAL_COMMUNITY): Payer: Self-pay | Admitting: *Deleted

## 2018-03-14 DIAGNOSIS — Z362 Encounter for other antenatal screening follow-up: Secondary | ICD-10-CM

## 2018-03-17 ENCOUNTER — Ambulatory Visit: Payer: Medicaid Other | Admitting: *Deleted

## 2018-03-17 DIAGNOSIS — N883 Incompetence of cervix uteri: Secondary | ICD-10-CM

## 2018-03-19 ENCOUNTER — Encounter: Payer: Self-pay | Admitting: *Deleted

## 2018-03-19 ENCOUNTER — Ambulatory Visit (INDEPENDENT_AMBULATORY_CARE_PROVIDER_SITE_OTHER): Payer: Medicaid Other | Admitting: Obstetrics and Gynecology

## 2018-03-19 ENCOUNTER — Encounter: Payer: Self-pay | Admitting: Obstetrics and Gynecology

## 2018-03-19 VITALS — BP 110/68 | HR 99 | Wt 286.0 lb

## 2018-03-19 DIAGNOSIS — O0990 Supervision of high risk pregnancy, unspecified, unspecified trimester: Secondary | ICD-10-CM

## 2018-03-19 DIAGNOSIS — N883 Incompetence of cervix uteri: Secondary | ICD-10-CM

## 2018-03-19 DIAGNOSIS — O24119 Pre-existing diabetes mellitus, type 2, in pregnancy, unspecified trimester: Secondary | ICD-10-CM

## 2018-03-19 DIAGNOSIS — O99210 Obesity complicating pregnancy, unspecified trimester: Secondary | ICD-10-CM

## 2018-03-19 NOTE — Progress Notes (Signed)
   PRENATAL VISIT NOTE  Subjective:  Traci Peterson is a 32 y.o. G3P0200 at [redacted]w[redacted]d being seen today for ongoing prenatal care.  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  Patient reports no complaints.  Contractions: Not present. Vag. Bleeding: None.  Movement: Present. Denies leaking of fluid.   The following portions of the patient's history were reviewed and updated as appropriate: allergies, current medications, past family history, past medical history, past social history, past surgical history and problem list. Problem list updated.  Objective:   Vitals:   03/19/18 1519  BP: 110/68  Pulse: 99  Weight: 286 lb (129.7 kg)    Fetal Status: Fetal Heart Rate (bpm): 155   Movement: Present     General:  Alert, oriented and cooperative. Patient is in no acute distress.  Skin: Skin is warm and dry. No rash noted.   Cardiovascular: Normal heart rate noted  Respiratory: Normal respiratory effort, no problems with respiration noted  Abdomen: Soft, gravid, appropriate for gestational age.  Pain/Pressure: Absent     Pelvic: Cervical exam deferred        Extremities: Normal range of motion.     Mental Status: Normal mood and affect. Normal behavior. Normal judgment and thought content.   Assessment and Plan:  Pregnancy: G3P0200  at [redacted]w[redacted]d  1. Supervision of high risk pregnancy, antepartum Patient is doing well without complaints Work letter provided allowing her to have frequent bathroom breaks, limiting lifting to 15 lb and max hours of 40 weeks  2. Incompetent cervix S/p cerclage. Normal CL on last ultrasound  3. Pre-existing type 2 diabetes mellitus during pregnancy, antepartum CBGs reviewed and all within range Continue metformin 1000 BID Fetal echo scheduled at [facility] at 22 weeks Follow up growth ultrasound and anatomy 11/18  4. Obesity in pregnancy   Preterm labor symptoms and general obstetric precautions including but not limited to vaginal bleeding, contractions, leaking of fluid and fetal movement were reviewed in detail with the patient. Please refer to After Visit Summary for other counseling recommendations.  Return in about 3 weeks (around 04/09/2018) for ROB.  Future Appointments  Date Time Provider Department Center  03/24/2018  7:00 AM Allie Bossier, MD [facility]  04/14/2018  7:45 AM WH-MFC [place] 2 WH-MFCUS MFC-US    Catalina Antigua, MD

## 2018-03-24 ENCOUNTER — Ambulatory Visit: Payer: Self-pay

## 2018-03-24 ENCOUNTER — Ambulatory Visit (INDEPENDENT_AMBULATORY_CARE_PROVIDER_SITE_OTHER): Payer: Medicaid Other | Admitting: Obstetrics & Gynecology

## 2018-03-24 VITALS — BP 105/46 | HR 97

## 2018-03-24 DIAGNOSIS — O09212 Supervision of pregnancy with history of pre-term labor, second trimester: Secondary | ICD-10-CM

## 2018-03-24 DIAGNOSIS — N883 Incompetence of cervix uteri: Secondary | ICD-10-CM

## 2018-03-24 NOTE — Progress Notes (Addendum)
Makena given SQ in Rt arm.  Cervix was checked by Dr Marice Potter @ bedside and pt was closed and thick.  Her NST did not show any contractions.

## 2018-03-24 NOTE — Progress Notes (Signed)
Pt here for weekly Makena injections.  She states she is having lower back spasms that started about 1 hour ago.  She has had a BM this AM but no relief.  She states it feels like gas.  Pain scale is a 7.  Pt placed on Toco for 290 minutes. NST was not positive for any contractions.  Recommended Therma Care for lower back and Tylenol for pain.

## 2018-03-31 ENCOUNTER — Ambulatory Visit (INDEPENDENT_AMBULATORY_CARE_PROVIDER_SITE_OTHER): Payer: Medicaid Other | Admitting: *Deleted

## 2018-03-31 DIAGNOSIS — N883 Incompetence of cervix uteri: Secondary | ICD-10-CM

## 2018-03-31 DIAGNOSIS — O09212 Supervision of pregnancy with history of pre-term labor, second trimester: Secondary | ICD-10-CM

## 2018-04-01 ENCOUNTER — Other Ambulatory Visit: Payer: Self-pay

## 2018-04-01 ENCOUNTER — Emergency Department (HOSPITAL_BASED_OUTPATIENT_CLINIC_OR_DEPARTMENT_OTHER)
Admission: EM | Admit: 2018-04-01 | Discharge: 2018-04-01 | Disposition: A | Discharge status: Home / Self Care | Payer: Medicaid Other | Attending: Emergency Medicine | Admitting: Emergency Medicine

## 2018-04-01 ENCOUNTER — Encounter (HOSPITAL_BASED_OUTPATIENT_CLINIC_OR_DEPARTMENT_OTHER): Payer: Self-pay | Admitting: *Deleted

## 2018-04-01 DIAGNOSIS — Z794 Long term (current) use of insulin: Secondary | ICD-10-CM | POA: Diagnosis not present

## 2018-04-01 DIAGNOSIS — Z79899 Other long term (current) drug therapy: Secondary | ICD-10-CM | POA: Diagnosis not present

## 2018-04-01 DIAGNOSIS — R05 Cough: Secondary | ICD-10-CM | POA: Diagnosis present

## 2018-04-01 DIAGNOSIS — Z87891 Personal history of nicotine dependence: Secondary | ICD-10-CM | POA: Insufficient documentation

## 2018-04-01 DIAGNOSIS — E119 Type 2 diabetes mellitus without complications: Secondary | ICD-10-CM | POA: Diagnosis not present

## 2018-04-01 DIAGNOSIS — J0100 Acute maxillary sinusitis, unspecified: Secondary | ICD-10-CM | POA: Diagnosis not present

## 2018-04-01 MED ORDER — AMOXICILLIN-POT CLAVULANATE 875-125 MG PO TABS: 1.0000 | ORAL_TABLET | Freq: Two times a day (BID) | ORAL | 0 refills | Status: DC

## 2018-04-01 NOTE — ED Provider Notes (Signed)
[facility] EMERGENCY DEPARTMENT Provider Note   CSN: [id number] Arrival date & time: 04/01/18  1112     History   Chief Complaint Chief Complaint  Patient presents with  . Cough    HPI Traci Peterson is a 32 y.o. female.  32 yo F with a chief complaint of cough congestion subjective fevers and chills and myalgias.  Going on for the past couple days.  Patient is about [redacted] weeks pregnant.  Denies vaginal bleeding abdominal pain pelvic pain.  Denies sick contacts.  Denies shortness of breath.  The history is provided by the patient.  Cough  This is a new problem. The current episode started 2 days ago. The problem occurs constantly. The problem has been gradually worsening. The cough is non-productive. Maximum temperature: subjective. The fever has been present for 1 to 2 days. Associated symptoms include chills. Pertinent negatives include no chest pain, no headaches, no rhinorrhea, no myalgias, no shortness of breath, no wheezing and no eye redness. She has tried nothing for the symptoms. The treatment provided no relief. She is not a smoker.    Past Medical History:  Diagnosis Date  . Anxiety    no current med.  . Cervix prolapsed into vagina   . Chronic headaches    otc med prn  . Constipation   . Depression    no meds currently  . Diabetes mellitus without complication ([place])    type 2  . Gall stones    Resolved with surgery  . GERD (gastroesophageal reflux disease)   . HPV in female   . Jaw snapping    states jaw pops if opens mouth too wide  . Ovarian cyst   . PCOS (polycystic ovarian syndrome)   . Pilonidal cyst 02/2013  . Preterm labor 2016   PPROM @ 18 wks, delivery of IUFD @ 21 wks  . Seasonal allergies   . SVD (spontaneous vaginal delivery)    x 2 - both fetal demises 2nd trim.  . Vaginal Pap smear, abnormal     Patient Active Problem List   Diagnosis Date Noted  . Heartburn in pregnancy 02/13/2018  . Trichomonal vaginitis during  pregnancy 01/20/2018  . Supervision of high risk pregnancy, antepartum 01/01/2018  . Pre-existing type 2 diabetes mellitus during pregnancy, antepartum 12/16/2017  . Type 2 diabetes mellitus ([place]) 12/15/2017  . Vitamin D deficiency 12/10/2017  . MDD (major depressive disorder), recurrent episode, severe ([place]) 05/29/2017  . Incompetent cervix 09/12/2016  . Gall stones 07/19/2016  . Family history of breast cancer in female 06/06/2016  . Obesity in pregnancy 10/25/2014  . Eczema 10/25/2014  . 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 Chronic headaches -  no meds 10/25/2014  . Anxiety and depression 11/30/2013  . PCOS (polycystic ovarian syndrome) 04/20/2013    Past Surgical History:  Procedure Laterality Date  . CERVICAL CERCLAGE N/A 01/21/2018   Procedure: CERCLAGE CERVICAL;  Surgeon: Guss Bunde, MD;  Location: [place];  Service: Gynecology;  Laterality: N/A;  . CHOLECYSTECTOMY N/A 07/21/2016   Procedure: LAPAROSCOPIC CHOLECYSTECTOMY;  Surgeon: Coralie Keens, MD;  Location: [facility];  Service: General;  Laterality: N/A;  . PILONIDAL CYST EXCISION N/A 03/12/2013   Procedure: CYST EXCISION PILONIDAL EXTENSIVE;  Surgeon: Harl Bowie, MD;  Location: [place];  Service: General;  Laterality: N/A;  . WISDOM TOOTH EXTRACTION  OB History    Gravida  3   Para  2   Term  0   Preterm  2   AB  0   Living  0     SAB  0   TAB  0   Ectopic  0   Multiple  1   Live Births  2            Home Medications    Prior to Admission medications   Medication Sig Start Date End Date Taking? Authorizing Provider  ACCU-CHEK FASTCLIX LANCETS MISC 1 Units by Percutaneous route 4 (four) times daily. 11/30/17   Jorje Guild, NP  amoxicillin-clavulanate (AUGMENTIN) 875-125 MG tablet Take 1 tablet by mouth every 12 (twelve) hours. 04/01/18   Deno Etienne, DO  Blood Glucose  Monitoring Suppl (ACCU-CHEK NANO SMARTVIEW) w/Device KIT 1 kit by Subdermal route as directed. Check blood sugars for fasting, and two hours after breakfast, lunch and dinner (4 checks daily) 11/30/17   Jorje Guild, NP  Cholecalciferol (VITAMIN D3) 2000 units TABS Take 2,000 Units by mouth daily.    [provider]  docusate sodium (COLACE) 100 MG capsule Take 1 capsule (100 mg total) by mouth every 12 (twelve) hours. Patient taking differently: Take 200 mg by mouth daily as needed for moderate constipation.  12/15/17   Darlina Rumpf, CNM  Elastic Bandages & Supports (COMFORT FIT MATERNITY SUPP LG) MISC 1 Units by Does not apply route daily. 03/11/18   Lajean Manes, CNM  famotidine (PEPCID) 20 MG tablet Take 1 tablet (20 mg total) by mouth 2 (two) times daily. 03/11/18   Lajean Manes, CNM  glucose blood (ACCU-CHEK SMARTVIEW) test strip Use as instructed to check blood sugars 11/30/17   Jorje Guild, NP  ibuprofen (ADVIL,MOTRIN) 600 MG tablet Take 1 tablet (600 mg total) by mouth every 6 (six) hours as needed. 02/08/18   Charlann Lange, PA-C  insulin glargine (LANTUS) 100 UNIT/ML injection Inject 0.1 mLs (10 Units total) into the skin at bedtime. 02/25/18   Guss Bunde, MD  metFORMIN (GLUCOPHAGE) 500 MG tablet Take 1 tablet (500 mg total) by mouth 2 (two) times daily with a meal. 11/30/17   Jorje Guild, NP  methocarbamol (ROBAXIN) 750 MG tablet Take 1 tablet (750 mg total) by mouth 3 (three) times daily. 02/08/18   Charlann Lange, PA-C  ondansetron (ZOFRAN) 4 MG tablet Take 1 tablet (4 mg total) by mouth every 8 (eight) hours as needed for nausea or vomiting. 02/15/18   Leftwich-Kirby, Kathie Dike, CNM  polyethylene glycol (MIRALAX / GLYCOLAX) packet Take 17 g by mouth daily. Patient taking differently: Take 17 g by mouth daily as needed for moderate constipation.  12/15/17   Darlina Rumpf, CNM  Prenatal Multivit-Min-Fe-FA (PRENATAL VITAMINS PO) Take 1 tablet by mouth  daily.     [provider]  ranitidine (ZANTAC) 150 MG tablet Take 1 tablet (150 mg total) by mouth 2 (two) times daily. 02/13/18 05/14/18  Virginia Rochester, NP    Family History Family History  Problem Relation Age of Onset  . Hypertension Mother   . Hyperlipidemia Mother   . Diabetes Mother   . Thyroid disease Mother   . Diabetes Father   . Hypertension Father   . Alcohol abuse Father   . Breast cancer Maternal Aunt   . Lung cancer Paternal Grandfather     Social History Social History   Tobacco Use  . Smoking status: Former Smoker  Packs/day: 0.25    Years: 12.00    Pack years: 3.00    Types: Cigarettes    Last attempt to quit: 01/10/2018    Years since quitting: 0.2  . Smokeless tobacco: Never Used  Substance Use Topics  . Alcohol use: No    Frequency: Never    Comment: socially but none with pregnancy  . Drug use: Yes    Types: Hydrocodone    Comment: prescribed this medication     Allergies   Lactose intolerance (gi)   Review of Systems Review of Systems  Constitutional: Positive for chills and fever ( subjective).  HENT: Positive for congestion. Negative for rhinorrhea.   Eyes: Negative for redness and visual disturbance.  Respiratory: Positive for cough. Negative for shortness of breath and wheezing.   Cardiovascular: Negative for chest pain and palpitations.  Gastrointestinal: Negative for nausea and vomiting.  Genitourinary: Negative for dysuria and urgency.  Musculoskeletal: Negative for arthralgias and myalgias.  Skin: Negative for pallor and wound.  Neurological: Negative for dizziness and headaches.     Physical Exam Updated Vital Signs BP 112/62 (BP Location: Right Arm)   Pulse (!) 104   Temp 98.3 F (36.8 C) (Oral)   Resp 20   Ht 5' 2" (1.575 m)   Wt 129.7 kg   LMP 10/27/2017 (Exact Date)   SpO2 100%   BMI 52.30 kg/m   Physical Exam  Constitutional: She is oriented to person, place, and time. She appears  well-developed and well-nourished. No distress.  HENT:  Head: Normocephalic and atraumatic.  Swollen turbinates, posterior nasal drip, left maxillary sinus exquisitely ttp, tm normal bilaterally.    Eyes: Pupils are equal, round, and reactive to light. EOM are normal.  Neck: Normal range of motion. Neck supple.  Cardiovascular: Normal rate and regular rhythm. Exam reveals no gallop and no friction rub.  No murmur heard. Pulmonary/Chest: Effort normal. She has no wheezes. She has no rales.  Abdominal: Soft. She exhibits no distension. There is no tenderness.  Musculoskeletal: She exhibits no edema or tenderness.  Neurological: She is alert and oriented to person, place, and time.  Skin: Skin is warm and dry. She is not diaphoretic.  Psychiatric: She has a normal mood and affect. Her behavior is normal.  Nursing note and vitals reviewed.    ED Treatments / Results  Labs (all labs ordered are listed, but only abnormal results are displayed) Labs Reviewed - No data to display  EKG None  Radiology No results found.  Procedures Procedures (including critical care time)  Medications Ordered in ED Medications - No data to display   Initial Impression / Assessment and Plan / ED Course  I have reviewed the triage vital signs and the nursing notes.  Pertinent labs & imaging results that were available during my care of the patient were reviewed by me and considered in my medical decision making (see chart for details).     32 yo F with a chief complaint of URI-like symptoms.  Clinically the patient has sinusitis.  Will treat with antibiotics.  OB follow-up.  3:00 PM:  I have discussed the diagnosis/risks/treatment options with the patient and believe the pt to be eligible for discharge home to follow-up with OB. We also discussed returning to the ED immediately if new or worsening sx occur. We discussed the sx which are most concerning (e.g., sudden worsening pain, fever, inability  to tolerate by mouth) that necessitate immediate return. Medications administered to the patient during their  visit and any new prescriptions provided to the patient are listed below.  Medications given during this visit Medications - No data to display    The patient appears reasonably screen and/or stabilized for discharge and I doubt any other medical condition or other [facility] requiring further screening, evaluation, or treatment in the ED at this time prior to discharge.    Final Clinical Impressions(s) / ED Diagnoses   Final diagnoses:  Acute maxillary sinusitis, recurrence not specified    ED Discharge Orders         Ordered    amoxicillin-clavulanate (AUGMENTIN) 875-125 MG tablet  Every 12 hours     04/01/18 [place], Yarel Rushlow, DO 04/01/18 1500

## 2018-04-01 NOTE — ED Triage Notes (Addendum)
Body aches, productive cough with yellow sputum x 2 days. She is [redacted] weeks pregnant with no pregnancy related complaints today. Baby is active.

## 2018-04-05 IMAGING — US US ABDOMEN LIMITED
1 series · 15 of 25 positions shown · non-contrast
Comparison: 01/16/2016

CLINICAL DATA: Right upper quadrant pain

EXAM:
US ABDOMEN LIMITED - RIGHT UPPER QUADRANT

[Series 1: us abdomen limited · 15 of 31 slices shown]
[im 1/31]
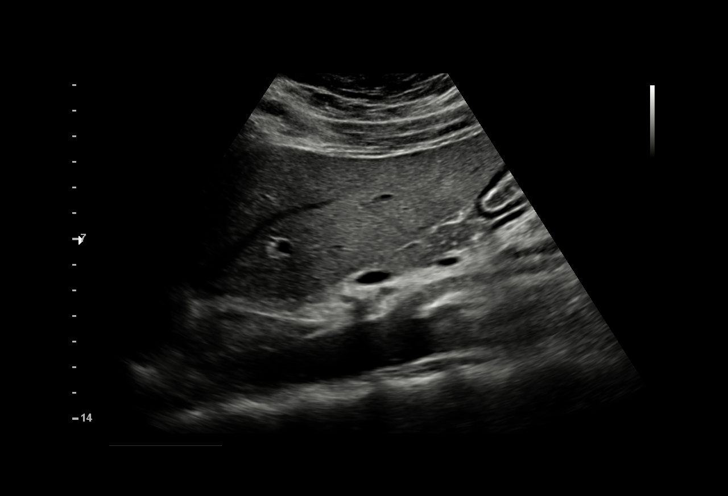
[im 3/31]
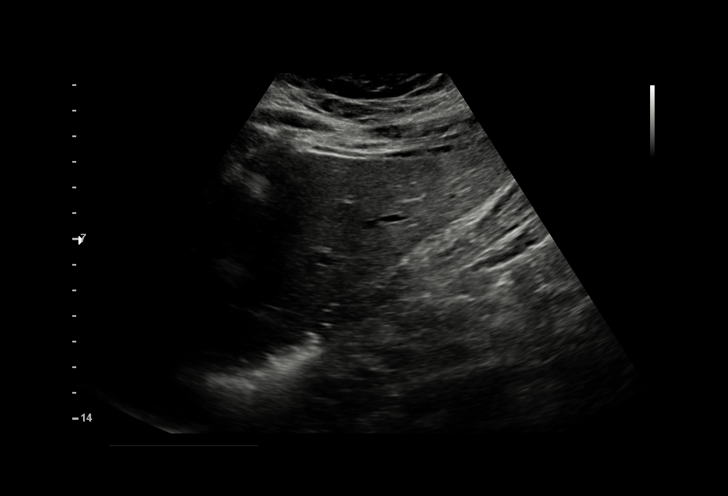
[im 6/31]
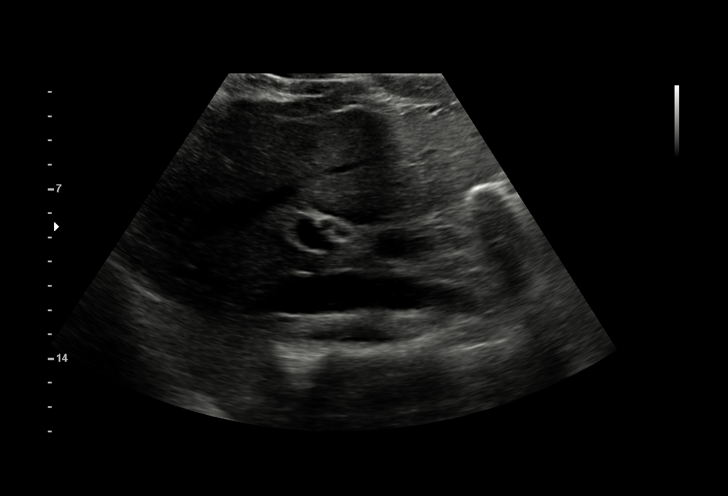
[im 7/31]
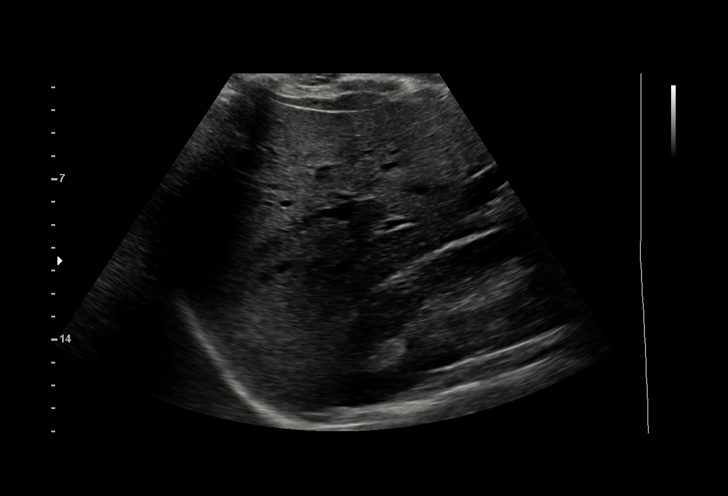
[im 9/31]
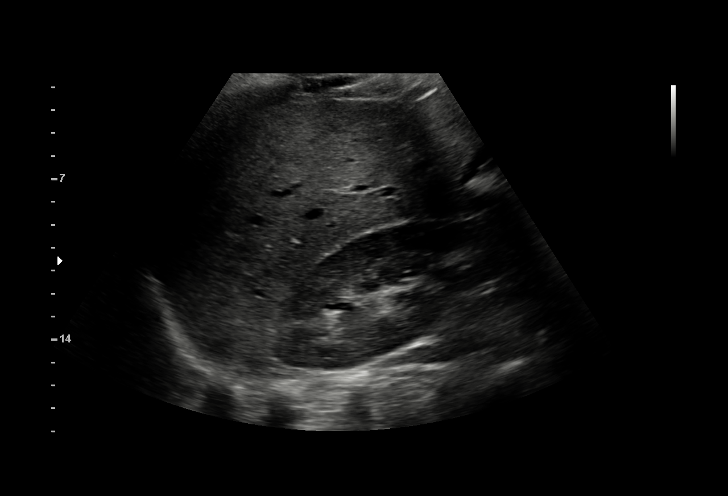
[im 12/31]
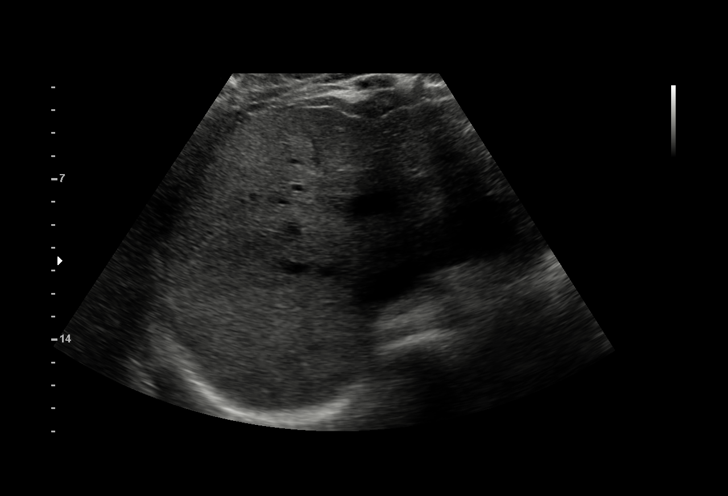
[im 13/31]
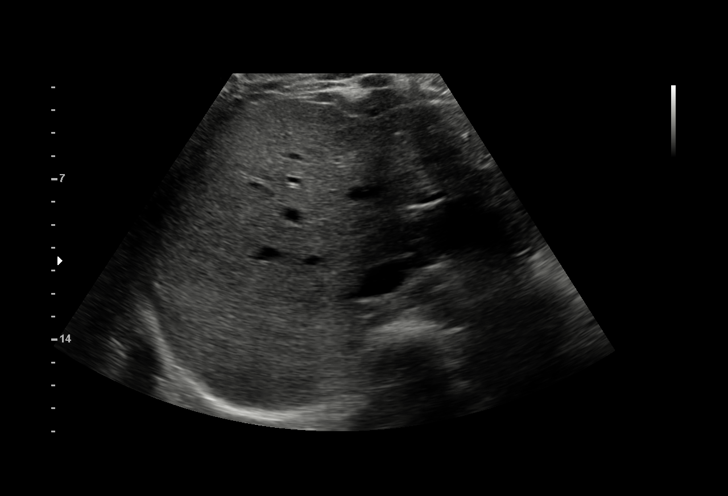
[im 16/31]
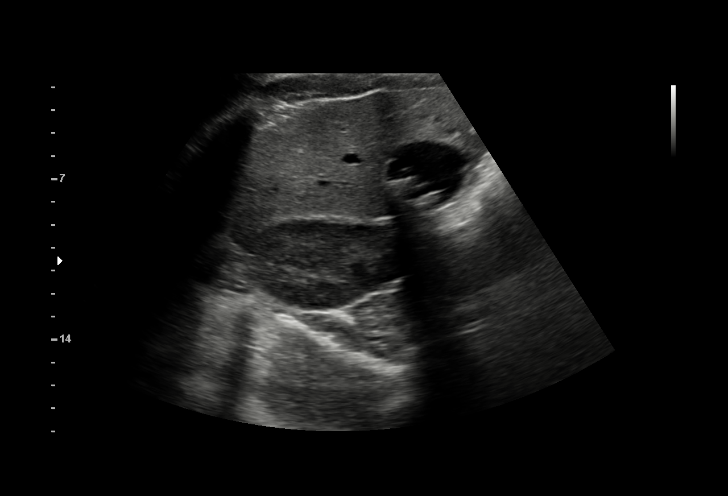
[im 18/31]
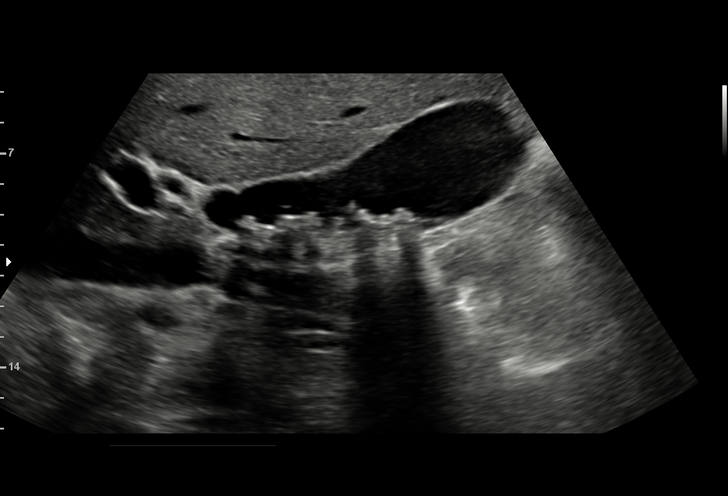
[im 19/31]
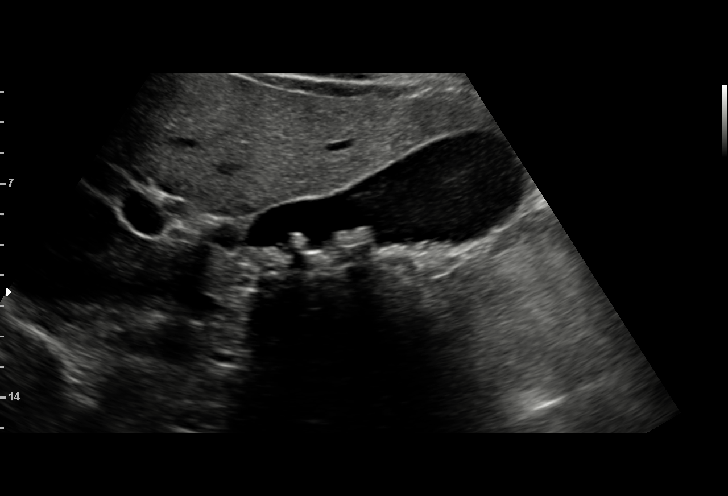
[im 22/31]
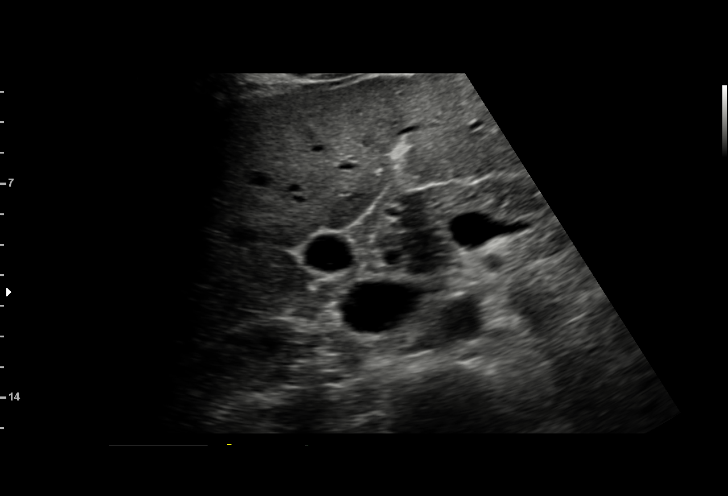
[im 24/31]
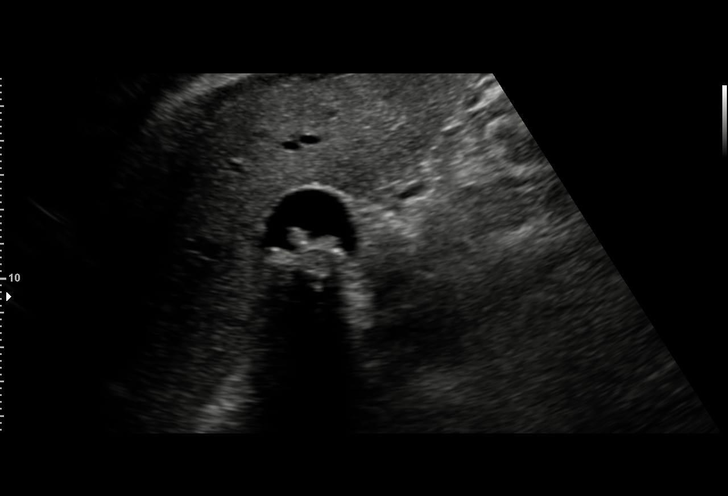
[im 26/31]
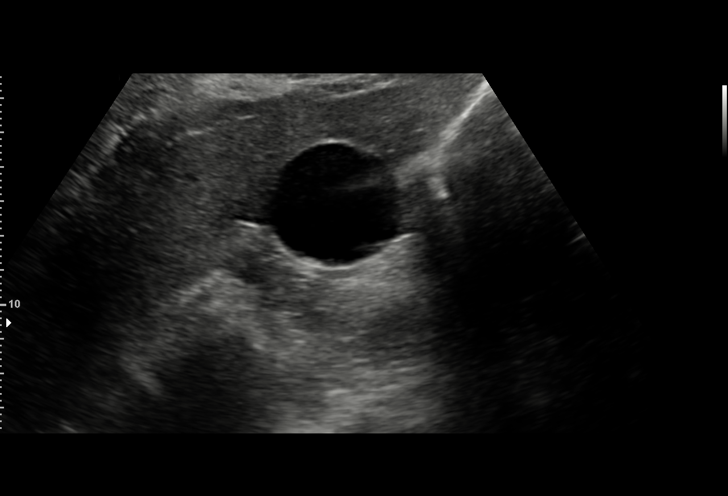
[im 28/31]
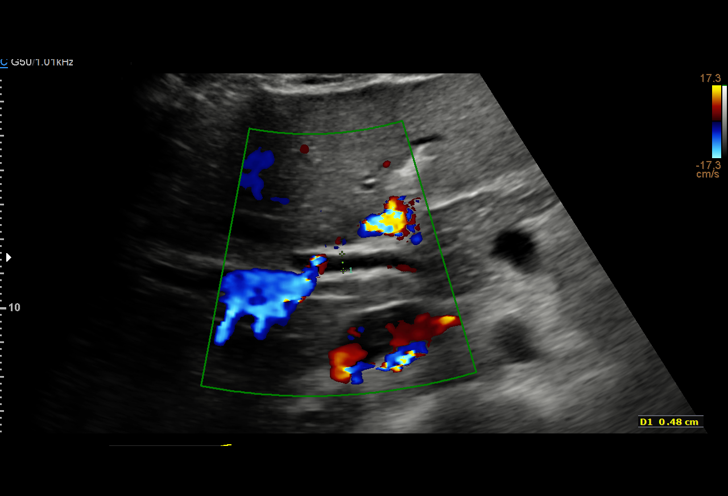
[im 31/31]
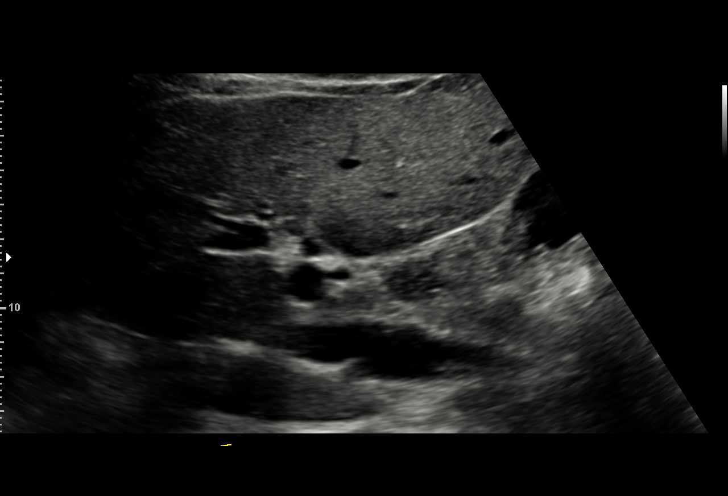

[15 of 25 positions shown; findings below may reference images not displayed]

FINDINGS: Gallbladder:

Multiple shadowing stones in the gallbladder. Normal wall thickness.
Negative sonographic Paramsothy.

Common bile duct:

Diameter: Normal at 4.8 mm

Liver:

No focal lesion identified. Within normal limits in parenchymal
echogenicity.
IMPRESSION: Cholelithiasis. No sonographic evidence for acute cholecystitis. No
biliary dilatation.

## 2018-04-07 ENCOUNTER — Other Ambulatory Visit (HOSPITAL_COMMUNITY)
Admission: RE | Admit: 2018-04-07 | Discharge: 2018-04-07 | Disposition: A | Discharge status: Home / Self Care | Payer: Medicaid Other | Source: Ambulatory Visit | Attending: Obstetrics & Gynecology | Admitting: Obstetrics & Gynecology

## 2018-04-07 ENCOUNTER — Other Ambulatory Visit (HOSPITAL_COMMUNITY): Payer: Self-pay

## 2018-04-07 ENCOUNTER — Ambulatory Visit (INDEPENDENT_AMBULATORY_CARE_PROVIDER_SITE_OTHER): Payer: Medicaid Other | Admitting: Obstetrics & Gynecology

## 2018-04-07 VITALS — BP 112/63 | HR 92 | Wt 288.0 lb

## 2018-04-07 DIAGNOSIS — Z8619 Personal history of other infectious and parasitic diseases: Secondary | ICD-10-CM | POA: Diagnosis not present

## 2018-04-07 DIAGNOSIS — O23592 Infection of other part of genital tract in pregnancy, second trimester: Secondary | ICD-10-CM | POA: Insufficient documentation

## 2018-04-07 DIAGNOSIS — A5901 Trichomonal vulvovaginitis: Secondary | ICD-10-CM

## 2018-04-07 DIAGNOSIS — O09212 Supervision of pregnancy with history of pre-term labor, second trimester: Secondary | ICD-10-CM

## 2018-04-07 DIAGNOSIS — O24419 Gestational diabetes mellitus in pregnancy, unspecified control: Secondary | ICD-10-CM | POA: Diagnosis not present

## 2018-04-07 DIAGNOSIS — O24119 Pre-existing diabetes mellitus, type 2, in pregnancy, unspecified trimester: Secondary | ICD-10-CM

## 2018-04-07 DIAGNOSIS — N883 Incompetence of cervix uteri: Secondary | ICD-10-CM

## 2018-04-07 DIAGNOSIS — O24112 Pre-existing diabetes mellitus, type 2, in pregnancy, second trimester: Secondary | ICD-10-CM

## 2018-04-07 LAB — GLUCOSE, POCT (MANUAL RESULT ENTRY): POC GLUCOSE: 157 mg/dL — AB (ref 70–99)

## 2018-04-07 NOTE — Progress Notes (Signed)
CBG 157 2 hr PP    PRENATAL VISIT NOTE  Subjective:  Traci Peterson is a 32 y.o. G3P0200 at [redacted]w[redacted]d being seen today for ongoing prenatal care.  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  Patient reports Upper respiratory infection resolving..  Contractions: Not present. Vag. Bleeding: None.  Movement: Present. Denies leaking of fluid.   The following portions of the patient's history were reviewed and updated as appropriate: allergies, current medications, past family history, past medical history, past social history, past surgical history and problem list. Problem list updated.  Objective:   Vitals:   04/07/18 1311  BP: 112/63  Pulse: 92  Weight: 288 lb (130.6 kg)    Fetal Status: Fetal Heart Rate (bpm): 153   Movement: Present     General:  Alert, oriented and cooperative. Patient is in no acute distress.  Skin: Skin is warm and dry. No rash noted.   Cardiovascular: Normal heart rate noted  Respiratory: Normal respiratory effort, no problems with respiration noted  Abdomen: Soft, gravid, appropriate for gestational age.  Pain/Pressure: Absent     Pelvic: Cervical exam performed      Close long and high with no tension on the stitch  Extremities: Normal range of motion.  Edema: None  Mental Status: Normal mood and  affect. Normal behavior. Normal judgment and thought content.   Assessment and Plan:  Pregnancy: G3P0200 at [redacted]w[redacted]d   Incompetent cervix Patient cervix is long and closed with no tension on the stitch.  Continue Makena  Trichomonal vaginitis during pregnancy in second trimester Test of cure today   Pre-existing type 2 diabetes mellitus during pregnancy, antepartum CBG is 157 after large carb load.  Patient has been sick and not eating for a week.  She said all her CBGs were around 90.  Today was her first day that she is eaten.  She knows that she cannot eat this way and will be more cognizant of her diet.  She is just relieved to feel better.  Call Wednesday with her CBGs.  Preterm labor symptoms and general obstetric precautions including but not limited to vaginal bleeding, contractions, leaking of fluid and fetal movement were reviewed in detail with the patient. Please refer to After Visit Summary for other counseling recommendations.  Return in about 2 weeks (around 04/21/2018).  Future Appointments  Date Time Provider Department Center  04/14/2018  7:45 AM [facility] [place] [facility]  04/14/2018 10:00 AM [facility] NURSE [facility]  04/21/2018  8:30 AM [facility] NURSE [facility]  04/28/2018  8:30 AM [facility] NURSE [facility]  05/05/2018  8:30 AM Penne Lash, Fredrich Romans, MD [facility]    [name], MD

## 2018-04-08 LAB — CERVICOVAGINAL ANCILLARY ONLY
BACTERIAL VAGINITIS: NEGATIVE
CANDIDA VAGINITIS: POSITIVE — AB
Chlamydia: NEGATIVE
Neisseria Gonorrhea: NEGATIVE
Trichomonas: NEGATIVE

## 2018-04-09 ENCOUNTER — Inpatient Hospital Stay (HOSPITAL_COMMUNITY)
Admission: AD | Admit: 2018-04-09 | Discharge: 2018-04-09 | Discharge status: Against Medical Advice | Payer: Medicaid Other | Source: Ambulatory Visit | Attending: Obstetrics & Gynecology | Admitting: Obstetrics & Gynecology

## 2018-04-09 DIAGNOSIS — Z5329 Procedure and treatment not carried out because of patient's decision for other reasons: Secondary | ICD-10-CM | POA: Insufficient documentation

## 2018-04-09 DIAGNOSIS — R1110 Vomiting, unspecified: Secondary | ICD-10-CM | POA: Diagnosis present

## 2018-04-09 LAB — URINALYSIS, ROUTINE W REFLEX MICROSCOPIC
BILIRUBIN URINE: NEGATIVE
GLUCOSE, UA: NEGATIVE mg/dL
Hgb urine dipstick: NEGATIVE
KETONES UR: NEGATIVE mg/dL
NITRITE: NEGATIVE
PH: 6 (ref 5.0–8.0)
Protein, ur: NEGATIVE mg/dL
Specific Gravity, Urine: 1.018 (ref 1.005–1.030)

## 2018-04-09 NOTE — MAU Note (Signed)
Pt here with c/o vomiting and diarrhea for the past 3 days. Reports good fetal movement. Denies any bleeding or leaking. Denies any contractions.

## 2018-04-09 NOTE — MAU Note (Signed)
Pt requests to sign out AMA to go home. AMA form signed

## 2018-04-10 ENCOUNTER — Telehealth: Payer: Self-pay

## 2018-04-10 NOTE — Telephone Encounter (Signed)
PT was supposed to call on 04/09/18 to report her CBGs. She did not call. When I spoke to pt today she said she had not been taking them because she had been sick, throwing up and not eating. She was seen in the MAU yesterday but left AMA because she said she had been waiting too long. I sent a message to Dr.Leggett letting her know.

## 2018-04-10 NOTE — Telephone Encounter (Addendum)
Attempted to call pt with these results and to ask if she is symptomatic but, she did not answer and voicemail box is full. I have sent pt a MyChart message.  ----- Message from Lesly DukesKelly H Leggett, MD sent at 04/10/2018 11:00 AM EST ----- Wet prep shows glabrata.  Difficult to treat.  Can you call [name] and ask if she is symptomatic.  If she is not symptomiatc, then I will not treat.  No trich!

## 2018-04-14 ENCOUNTER — Encounter (HOSPITAL_COMMUNITY): Payer: Self-pay

## 2018-04-14 ENCOUNTER — Ambulatory Visit (HOSPITAL_COMMUNITY)
Admission: RE | Admit: 2018-04-14 | Discharge: 2018-04-14 | Disposition: A | Discharge status: Home / Self Care | Payer: Medicaid Other | Source: Ambulatory Visit | Attending: Nurse Practitioner | Admitting: Nurse Practitioner

## 2018-04-14 ENCOUNTER — Ambulatory Visit (INDEPENDENT_AMBULATORY_CARE_PROVIDER_SITE_OTHER): Payer: Medicaid Other

## 2018-04-14 ENCOUNTER — Other Ambulatory Visit (HOSPITAL_COMMUNITY): Payer: Self-pay | Admitting: *Deleted

## 2018-04-14 DIAGNOSIS — O24112 Pre-existing diabetes mellitus, type 2, in pregnancy, second trimester: Secondary | ICD-10-CM | POA: Diagnosis not present

## 2018-04-14 DIAGNOSIS — Z3A24 24 weeks gestation of pregnancy: Secondary | ICD-10-CM | POA: Diagnosis not present

## 2018-04-14 DIAGNOSIS — O09219 Supervision of pregnancy with history of pre-term labor, unspecified trimester: Secondary | ICD-10-CM

## 2018-04-14 DIAGNOSIS — O09212 Supervision of pregnancy with history of pre-term labor, second trimester: Secondary | ICD-10-CM | POA: Diagnosis present

## 2018-04-14 DIAGNOSIS — O99212 Obesity complicating pregnancy, second trimester: Secondary | ICD-10-CM | POA: Diagnosis not present

## 2018-04-14 DIAGNOSIS — O3432 Maternal care for cervical incompetence, second trimester: Secondary | ICD-10-CM | POA: Diagnosis not present

## 2018-04-14 DIAGNOSIS — O0990 Supervision of high risk pregnancy, unspecified, unspecified trimester: Secondary | ICD-10-CM

## 2018-04-14 DIAGNOSIS — N883 Incompetence of cervix uteri: Secondary | ICD-10-CM

## 2018-04-14 NOTE — Progress Notes (Signed)
Pt here for Makena injection. Injection given in left arm and tolerated well. Pt will return 11/25 for next Makena.

## 2018-04-21 ENCOUNTER — Ambulatory Visit: Payer: Medicaid Other

## 2018-04-22 ENCOUNTER — Ambulatory Visit (INDEPENDENT_AMBULATORY_CARE_PROVIDER_SITE_OTHER): Payer: Medicaid Other

## 2018-04-22 DIAGNOSIS — O09212 Supervision of pregnancy with history of pre-term labor, second trimester: Secondary | ICD-10-CM

## 2018-04-22 DIAGNOSIS — N883 Incompetence of cervix uteri: Secondary | ICD-10-CM

## 2018-04-22 MED ORDER — HYDROXYPROGESTERONE CAPROATE 275 MG/1.1ML ~~LOC~~ SOAJ
275.0000 mg | Freq: Once | SUBCUTANEOUS | Status: AC
  Administered 2018-04-22: 275 mg via SUBCUTANEOUS

## 2018-04-22 NOTE — Progress Notes (Signed)
PT here for Makena injection. Injection given in right arm. [facility]

## 2018-04-24 IMAGING — US US MFM OB LIMITED
1 series · 15 of 26 positions shown · non-contrast
Comparison: none

[Series 1: us mfm ob limited · 26 acquisitions, 15 frames shown]
[im 1/26]
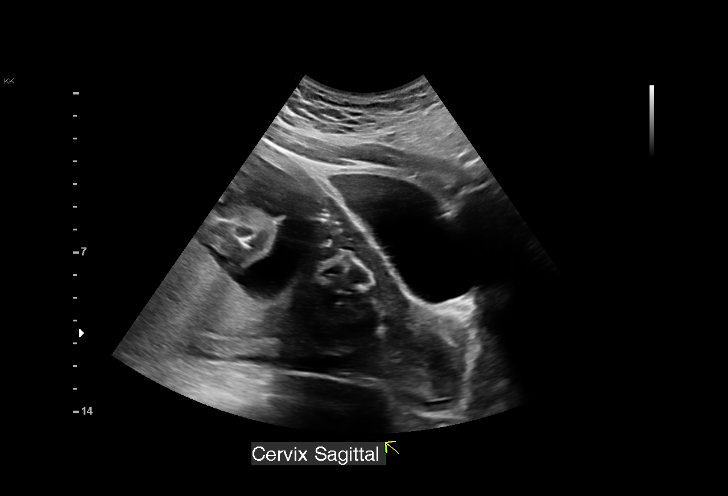
[im 3/26]
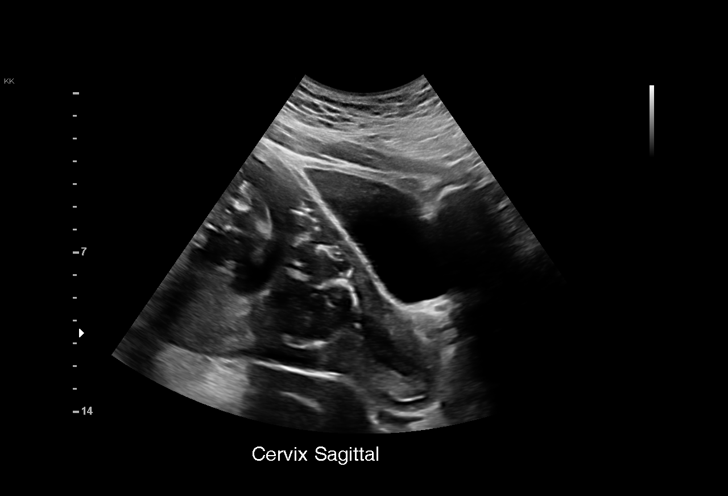
[im 5/26]
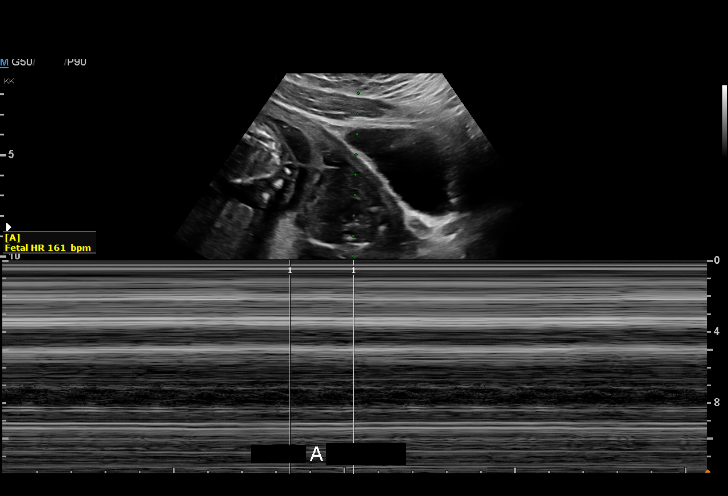
[im 7/26]
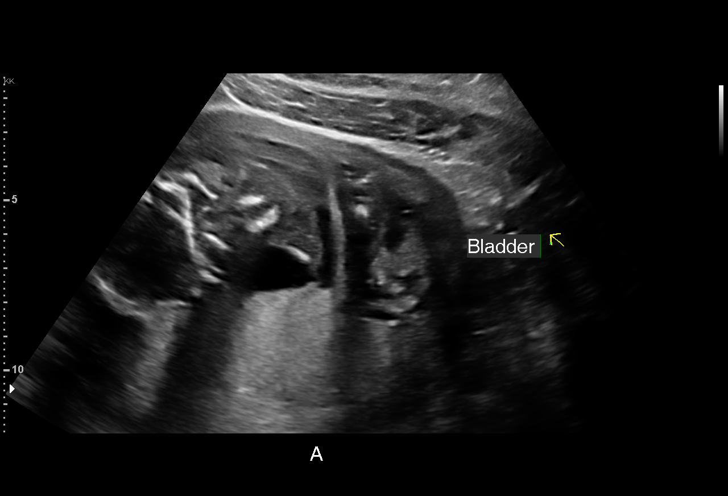
[im 8/26]
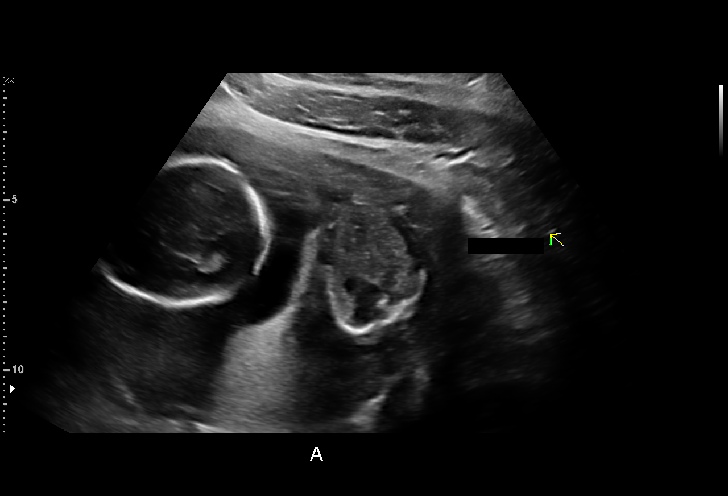
[im 10/26]
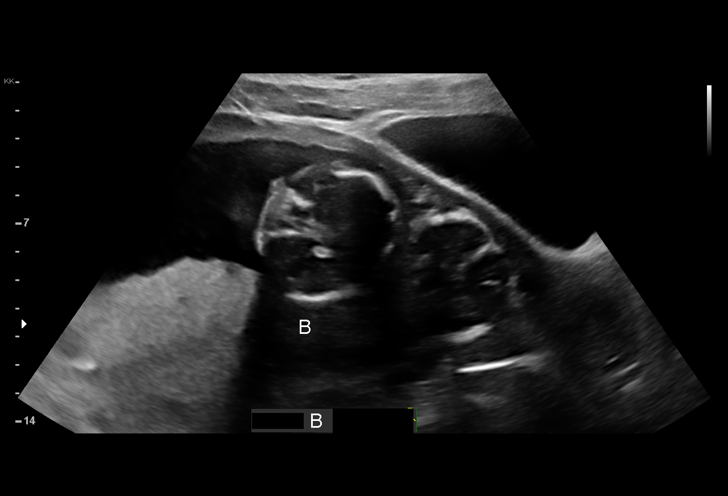
[im 12/26]
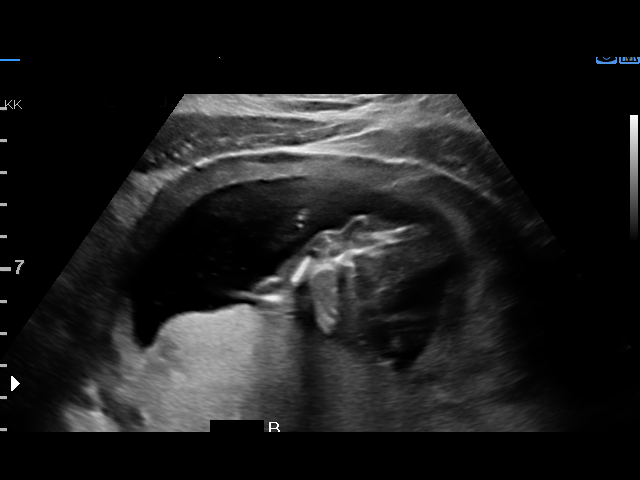
[im 14/26]
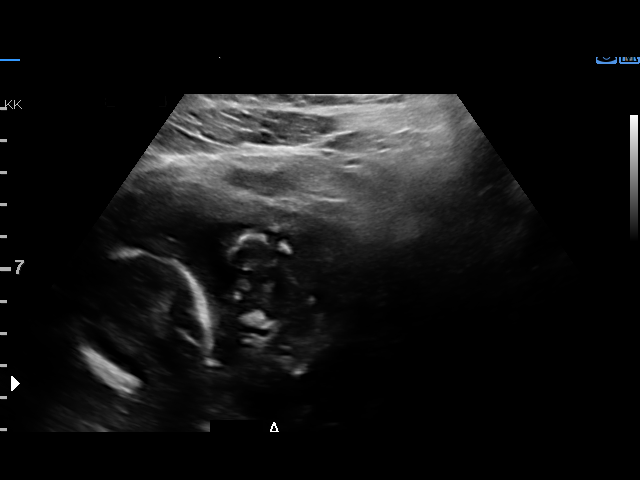
[im 15/26]
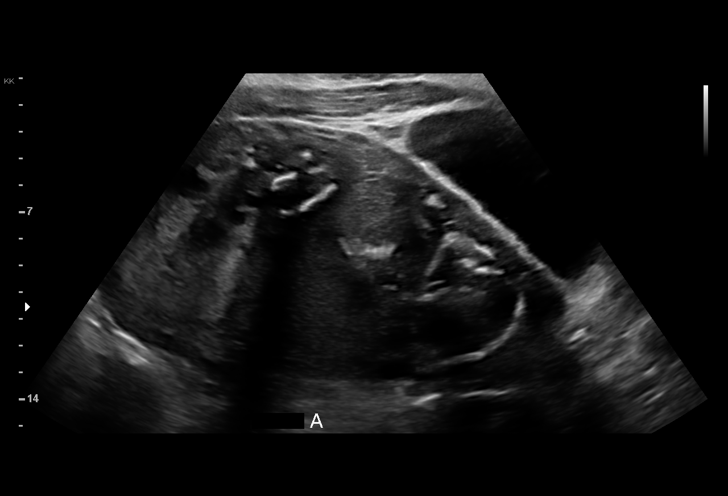
[im 17/26]
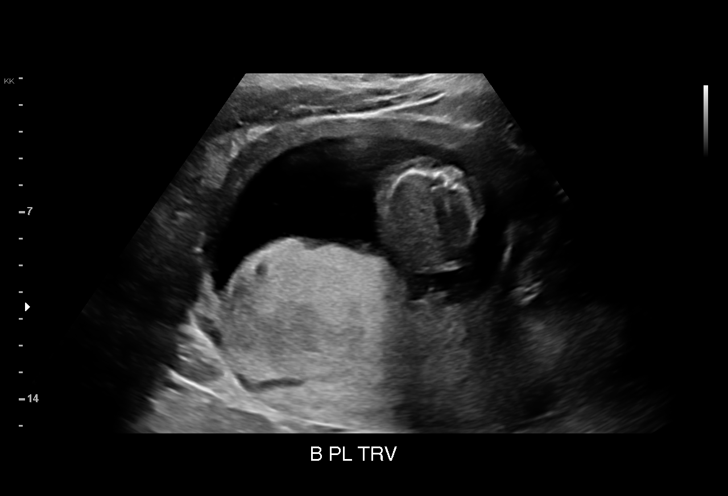
[im 19/26]
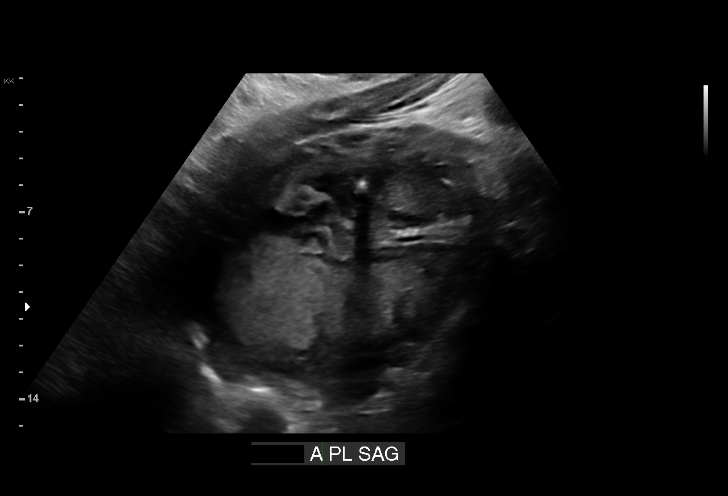
[im 20/26]
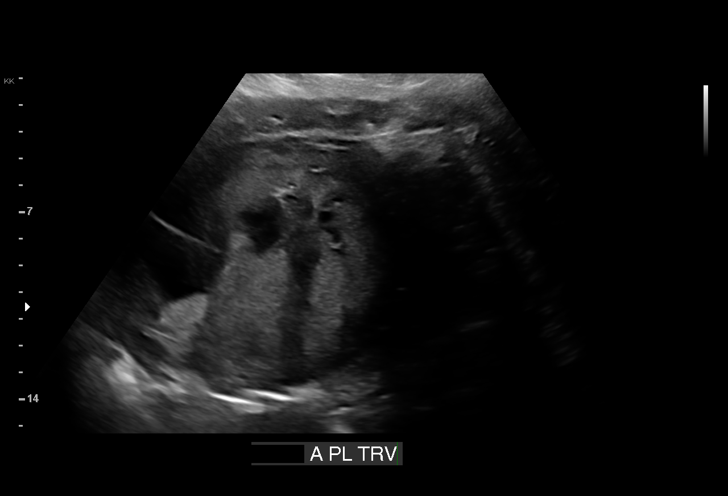
[im 22/26]
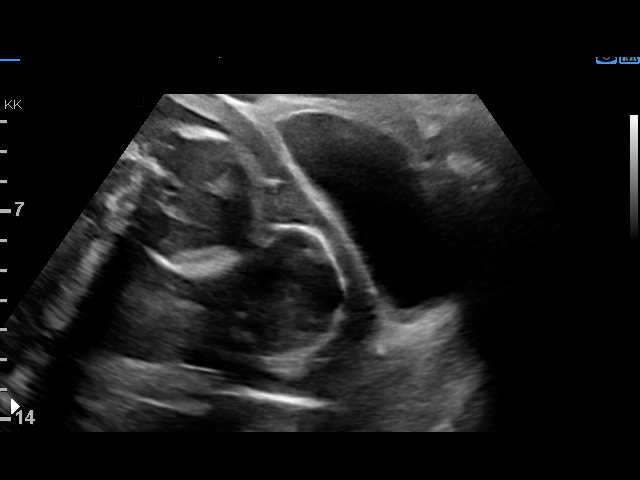
[im 24/26]
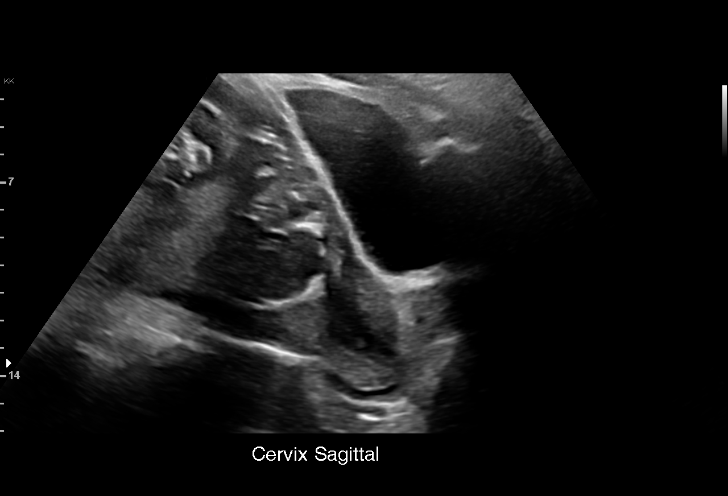
[im 26/26]
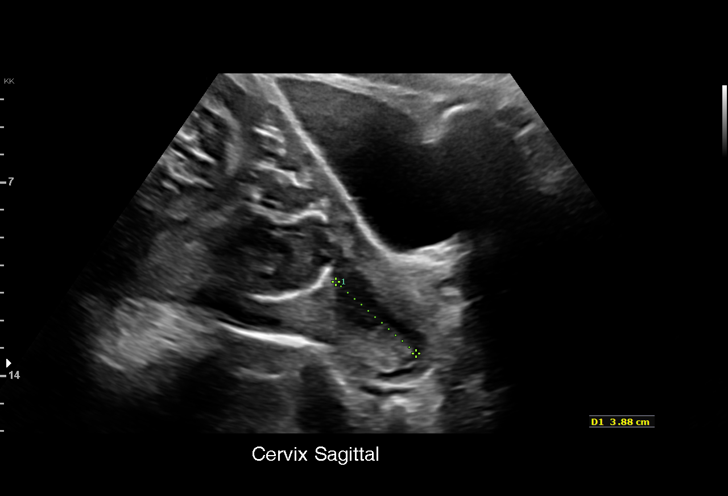

[15 of 26 positions shown; findings below may reference images not displayed]

MAU/Triage

Indications

17 weeks gestation of pregnancy
Obesity complicating pregnancy, first
trimester
Poor obstetric history: Previous preterm
delivery, antepartum (21 weeks)
Twin pregnancy, di/di, first trimester
Premature rupture of membranes - leaking
fluid
Vaginal bleeding in pregnancy, second
trimester

OB History

Blood Type:            Height:  5'2"   Weight (lb):  270       BMI:
Gravidity:    2         Prem:   1
Living:       0
Fetal Evaluation (Fetus A)

Num Of Fetuses:     2
Fetal Heart         161
Rate(bpm):
Cardiac Activity:   Observed
Fetal Lie:          Lower Fetus
Presentation:       Cephalic
Placenta:           Posterior, above cervical os

Amniotic Fluid
AFI FV:      Anhydramnios
Gestational Age (Fetus A)

Best:          17w 6d     Det. By:  Previous Ultrasound      EDD:   01/09/17
(05/30/16)

Fetal Evaluation (Fetus B)

Num Of Fetuses:     2
Fetal Heart         170
Rate(bpm):
Cardiac Activity:   Observed
Fetal Lie:          Upper Fetus
Presentation:       Cephalic
Placenta:           Posterior, above cervical os

Amniotic Fluid
AFI FV:      Subjectively within normal limits

Largest Pocket(cm)
3.9
Gestational Age (Fetus B)

Best:          17w 6d     Det. By:  Previous Ultrasound      EDD:   01/09/17
(05/30/16)
Cervix Uterus Adnexa

Cervix
Length:            3.4  cm.
Normal appearance by transabdominal scan.
Impression

Diamniotic Dichorionic intrauterine pregnancy at 17+6 weeks
with vaginal bleeding
Presentation is cephalic/cephalic
Normal amniotic fluid in sac of twin B, with  MVP of 3.9 cm.
Anhydramnios of twin A
normal fetal cardiac activity both twins
Recommendations

Probable PPROM of twin A
continue clinical evaluation and management

## 2018-04-28 ENCOUNTER — Ambulatory Visit (INDEPENDENT_AMBULATORY_CARE_PROVIDER_SITE_OTHER): Payer: Medicaid Other

## 2018-04-28 DIAGNOSIS — O09212 Supervision of pregnancy with history of pre-term labor, second trimester: Secondary | ICD-10-CM | POA: Diagnosis not present

## 2018-04-28 DIAGNOSIS — N883 Incompetence of cervix uteri: Secondary | ICD-10-CM

## 2018-04-28 NOTE — Progress Notes (Signed)
PT here for makena injection. Injection given in left arm and tolerated well. PT will return next week for next injection.

## 2018-05-02 ENCOUNTER — Other Ambulatory Visit: Payer: Self-pay | Admitting: Obstetrics & Gynecology

## 2018-05-02 ENCOUNTER — Other Ambulatory Visit: Payer: Self-pay | Admitting: Certified Nurse Midwife

## 2018-05-02 MED ORDER — ESOMEPRAZOLE MAGNESIUM 20 MG PO CPDR: DELAYED_RELEASE_CAPSULE | ORAL | 4 refills | Status: DC

## 2018-05-02 NOTE — Progress Notes (Signed)
[facility]  RN to make sure correct pharmacy used (3 were listed)

## 2018-05-05 ENCOUNTER — Ambulatory Visit (INDEPENDENT_AMBULATORY_CARE_PROVIDER_SITE_OTHER): Payer: Medicaid Other

## 2018-05-05 ENCOUNTER — Encounter: Payer: Medicaid Other | Admitting: Obstetrics & Gynecology

## 2018-05-05 DIAGNOSIS — Z9049 Acquired absence of other specified parts of digestive tract: Secondary | ICD-10-CM | POA: Diagnosis not present

## 2018-05-05 DIAGNOSIS — L03317 Cellulitis of buttock: Secondary | ICD-10-CM | POA: Diagnosis not present

## 2018-05-05 DIAGNOSIS — Z794 Long term (current) use of insulin: Secondary | ICD-10-CM | POA: Insufficient documentation

## 2018-05-05 DIAGNOSIS — O09212 Supervision of pregnancy with history of pre-term labor, second trimester: Secondary | ICD-10-CM

## 2018-05-05 DIAGNOSIS — Z87891 Personal history of nicotine dependence: Secondary | ICD-10-CM | POA: Diagnosis not present

## 2018-05-05 DIAGNOSIS — O9989 Other specified diseases and conditions complicating pregnancy, childbirth and the puerperium: Secondary | ICD-10-CM | POA: Diagnosis present

## 2018-05-05 DIAGNOSIS — Z3A27 27 weeks gestation of pregnancy: Secondary | ICD-10-CM | POA: Insufficient documentation

## 2018-05-05 DIAGNOSIS — O24112 Pre-existing diabetes mellitus, type 2, in pregnancy, second trimester: Secondary | ICD-10-CM | POA: Diagnosis not present

## 2018-05-05 DIAGNOSIS — O99342 Other mental disorders complicating pregnancy, second trimester: Secondary | ICD-10-CM | POA: Insufficient documentation

## 2018-05-05 DIAGNOSIS — F329 Major depressive disorder, single episode, unspecified: Secondary | ICD-10-CM | POA: Diagnosis not present

## 2018-05-05 DIAGNOSIS — F419 Anxiety disorder, unspecified: Secondary | ICD-10-CM | POA: Insufficient documentation

## 2018-05-05 DIAGNOSIS — N883 Incompetence of cervix uteri: Secondary | ICD-10-CM

## 2018-05-05 MED ORDER — HYDROXYPROGESTERONE CAPROATE 250 MG/ML IM OIL
250.0000 mg | TOPICAL_OIL | Freq: Once | INTRAMUSCULAR | Status: AC
  Administered 2018-05-05: 250 mg via INTRAMUSCULAR

## 2018-05-05 NOTE — Progress Notes (Signed)
PT here for Makena injection. Injection given RUOQ. Pt will return next Monday for next Makena.

## 2018-05-06 ENCOUNTER — Telehealth: Payer: Self-pay | Admitting: Advanced Practice Midwife

## 2018-05-06 ENCOUNTER — Other Ambulatory Visit: Payer: Self-pay

## 2018-05-06 ENCOUNTER — Ambulatory Visit (INDEPENDENT_AMBULATORY_CARE_PROVIDER_SITE_OTHER): Payer: Medicaid Other | Admitting: Advanced Practice Midwife

## 2018-05-06 ENCOUNTER — Emergency Department (HOSPITAL_COMMUNITY)
Admission: EM | Admit: 2018-05-06 | Discharge: 2018-05-06 | Disposition: A | Discharge status: Home / Self Care | Payer: Medicaid Other | Attending: Emergency Medicine | Admitting: Emergency Medicine

## 2018-05-06 ENCOUNTER — Encounter (HOSPITAL_COMMUNITY): Payer: Self-pay | Admitting: Emergency Medicine

## 2018-05-06 VITALS — BP 113/64 | HR 97 | Wt 289.0 lb

## 2018-05-06 DIAGNOSIS — O24119 Pre-existing diabetes mellitus, type 2, in pregnancy, unspecified trimester: Secondary | ICD-10-CM

## 2018-05-06 DIAGNOSIS — N883 Incompetence of cervix uteri: Secondary | ICD-10-CM

## 2018-05-06 DIAGNOSIS — L0390 Cellulitis, unspecified: Secondary | ICD-10-CM

## 2018-05-06 DIAGNOSIS — O0990 Supervision of high risk pregnancy, unspecified, unspecified trimester: Secondary | ICD-10-CM

## 2018-05-06 DIAGNOSIS — L0501 Pilonidal cyst with abscess: Secondary | ICD-10-CM

## 2018-05-06 DIAGNOSIS — O0992 Supervision of high risk pregnancy, unspecified, second trimester: Secondary | ICD-10-CM

## 2018-05-06 DIAGNOSIS — O24112 Pre-existing diabetes mellitus, type 2, in pregnancy, second trimester: Secondary | ICD-10-CM

## 2018-05-06 MED ORDER — CLINDAMYCIN HCL 300 MG PO CAPS: 300.0000 mg | ORAL_CAPSULE | Freq: Three times a day (TID) | ORAL | 0 refills | Status: AC

## 2018-05-06 MED ORDER — LIDOCAINE HCL 2 % IJ SOLN
10.0000 mL | Freq: Once | INTRAMUSCULAR | Status: AC
  Administered 2018-05-06: 200 mg via INTRADERMAL
  Filled 2018-05-06: qty 20

## 2018-05-06 NOTE — ED Triage Notes (Signed)
Pt reports having a cyst to coccyx that started several days ago and has prior history of cyst. Pt reports being [redacted]weeks pregnant. No pregnancy related symptoms.

## 2018-05-06 NOTE — ED Provider Notes (Signed)
[facility] DEPT Provider Note   CSN: [id number] Arrival date & time: 05/05/18  2239     History   Chief Complaint Chief Complaint  Patient presents with  . Cyst    HPI Traci Peterson is a 32 y.o. female.  HPI   Patient is a 32 year old female that is currently [redacted] weeks pregnant who presents the emergency department today for evaluation of a possible pilonidal cyst/abscess.  She states that it flared up "a while ago "but has been bothering her worse for the last 2 to 3 days.  States pain is constant worse with sitting or pressure to the area.  Has had no drainage from the area.  She reports a history of I&D to this area as well as surgery by Dr. Ninfa Linden.  No fevers chills or systemic symptoms of infection.  States she has a history of diabetes and has been compliant with medications.  Reports positive fetal movement.  No vaginal bleeding, vaginal discharge, urinary symptoms.  No chest pain, shortness of breath or abdominal pain.  She has been following up regularly with OB during this pregnancy.  Past Medical History:  Diagnosis Date  . Anxiety    no current med.  . Cervix prolapsed into vagina   . Chronic headaches    otc med prn  . Constipation   . Depression    no meds currently  . Diabetes mellitus without complication ([place])    type 2  . Gall stones    Resolved with surgery  . GERD (gastroesophageal reflux disease)   . HPV in female   . Jaw snapping    states jaw pops if opens mouth too wide  . Ovarian cyst   . PCOS (polycystic ovarian syndrome)   . Pilonidal cyst 02/2013  . Preterm labor 2016   PPROM @ 18 wks, delivery of IUFD @ 21 wks  . Seasonal allergies   . SVD (spontaneous vaginal delivery)    x 2 - both fetal demises 2nd trim.  . Vaginal Pap smear, abnormal     Patient Active Problem List   Diagnosis Date Noted  . Heartburn in pregnancy 02/13/2018  . Trichomonal vaginitis during pregnancy 01/20/2018  . Supervision of  high risk pregnancy, antepartum 01/01/2018  . Pre-existing type 2 diabetes mellitus during pregnancy, antepartum 12/16/2017  . Type 2 diabetes mellitus ([place]) 12/15/2017  . Vitamin D deficiency 12/10/2017  . MDD (major depressive disorder), recurrent episode, severe ([place]) 05/29/2017  . Incompetent cervix 09/12/2016  . Gall stones 07/19/2016  . Family history of breast cancer in female 06/06/2016  . Obesity in pregnancy 10/25/2014  . Eczema 10/25/2014  . 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 Chronic headaches -  no meds 10/25/2014  . Anxiety and depression 11/30/2013  . PCOS (polycystic ovarian syndrome) 04/20/2013    Past Surgical History:  Procedure Laterality Date  . CERVICAL CERCLAGE N/A 01/21/2018   Procedure: CERCLAGE CERVICAL;  Surgeon: Guss Bunde, MD;  Location: [place];  Service: Gynecology;  Laterality: N/A;  . CHOLECYSTECTOMY N/A 07/21/2016   Procedure: LAPAROSCOPIC CHOLECYSTECTOMY;  Surgeon: Coralie Keens, MD;  Location: [facility];  Service: General;  Laterality: N/A;  . PILONIDAL CYST EXCISION N/A 03/12/2013   Procedure: CYST EXCISION PILONIDAL EXTENSIVE;  Surgeon: Harl Bowie, MD;  Location: [facility];  Service: General;  Laterality: N/A;  . WISDOM TOOTH EXTRACTION  OB History    Gravida  3   Para  2   Term  0   Preterm  2   AB  0   Living  0     SAB  0   TAB  0   Ectopic  0   Multiple  1   Live Births  2            Home Medications    Prior to Admission medications   Medication Sig Start Date End Date Taking? Authorizing Provider  ACCU-CHEK FASTCLIX LANCETS MISC 1 Units by Percutaneous route 4 (four) times daily. 11/30/17   Jorje Guild, NP  Blood Glucose Monitoring Suppl (ACCU-CHEK NANO SMARTVIEW) w/Device KIT 1 kit by Subdermal route as directed. Check blood sugars for fasting, and two hours after breakfast, lunch and  dinner (4 checks daily) 11/30/17   Jorje Guild, NP  Cholecalciferol (VITAMIN D3) 2000 units TABS Take 2,000 Units by mouth daily.    [provider]  clindamycin (CLEOCIN) 300 MG capsule Take 1 capsule (300 mg total) by mouth 3 (three) times daily for 7 days. 05/06/18 05/13/18  Angeles Paolucci S, PA-C  docusate sodium (COLACE) 100 MG capsule Take 1 capsule (100 mg total) by mouth every 12 (twelve) hours. Patient taking differently: Take 200 mg by mouth daily as needed for moderate constipation.  12/15/17   Darlina Rumpf, CNM  Elastic Bandages & Supports (COMFORT FIT MATERNITY SUPP LG) MISC 1 Units by Does not apply route daily. 03/11/18   Lajean Manes, CNM  esomeprazole (NEXIUM) 20 MG capsule Take one tablet daily in the morning. 05/02/18   Guss Bunde, MD  famotidine (PEPCID) 20 MG tablet Take 1 tablet (20 mg total) by mouth 2 (two) times daily. 03/11/18   Lajean Manes, CNM  glucose blood (ACCU-CHEK SMARTVIEW) test strip Use as instructed to check blood sugars 11/30/17   Jorje Guild, NP  ibuprofen (ADVIL,MOTRIN) 600 MG tablet Take 1 tablet (600 mg total) by mouth every 6 (six) hours as needed. Patient not taking: Reported on 04/14/2018 02/08/18   Charlann Lange, PA-C  insulin glargine (LANTUS) 100 UNIT/ML injection Inject 0.1 mLs (10 Units total) into the skin at bedtime. Patient not taking: Reported on 04/07/2018 02/25/18   Guss Bunde, MD  metFORMIN (GLUCOPHAGE) 500 MG tablet Take 1 tablet (500 mg total) by mouth 2 (two) times daily with a meal. 11/30/17   Jorje Guild, NP  methocarbamol (ROBAXIN) 750 MG tablet Take 1 tablet (750 mg total) by mouth 3 (three) times daily. Patient not taking: Reported on 04/14/2018 02/08/18   Charlann Lange, PA-C  ondansetron (ZOFRAN) 4 MG tablet Take 1 tablet (4 mg total) by mouth every 8 (eight) hours as needed for nausea or vomiting. Patient not taking: Reported on 04/07/2018 02/15/18   Leftwich-Kirby, Lattie Haw A, CNM  polyethylene  glycol (MIRALAX / GLYCOLAX) packet Take 17 g by mouth daily. Patient taking differently: Take 17 g by mouth daily as needed for moderate constipation.  12/15/17   Darlina Rumpf, CNM  Prenatal Multivit-Min-Fe-FA (PRENATAL VITAMINS PO) Take 1 tablet by mouth daily.     [provider]  ranitidine (ZANTAC) 150 MG tablet Take 1 tablet (150 mg total) by mouth 2 (two) times daily. Patient not taking: Reported on 04/14/2018 02/13/18 05/14/18  Virginia Rochester, NP    Family History Family History  Problem Relation Age of Onset  . Hypertension Mother   . Hyperlipidemia Mother   . Diabetes  Mother   . Thyroid disease Mother   . Diabetes Father   . Hypertension Father   . Alcohol abuse Father   . Breast cancer Maternal Aunt   . Lung cancer Paternal Grandfather     Social History Social History   Tobacco Use  . Smoking status: Former Smoker    Packs/day: 0.25    Years: 12.00    Pack years: 3.00    Types: Cigarettes    Last attempt to quit: 01/10/2018    Years since quitting: 0.3  . Smokeless tobacco: Never Used  Substance Use Topics  . Alcohol use: No    Frequency: Never    Comment: socially but none with pregnancy  . Drug use: Not Currently    Types: Hydrocodone    Comment: prescribed this medication     Allergies   Lactose intolerance (gi)   Review of Systems Review of Systems  Constitutional: Negative for chills and fever.  Eyes: Negative for visual disturbance.  Respiratory: Negative for cough and shortness of breath.   Cardiovascular: Negative for chest pain.  Gastrointestinal: Negative for abdominal pain, nausea and vomiting.  Genitourinary: Negative for vaginal bleeding and vaginal discharge.  Musculoskeletal: Negative for back pain.  Skin:       Possible cyst/abscess to buttocks  Neurological: Negative for headaches.    Physical Exam Updated Vital Signs BP 107/72 (BP Location: Left Arm)   Pulse 99   Temp 98.3 F (36.8 C) (Oral)   Resp 18    Ht 5' 2"  (1.575 m)   Wt 126.1 kg   LMP 10/27/2017 (Exact Date)   SpO2 100%   BMI 50.87 kg/m   Physical Exam  Constitutional: She appears well-developed and well-nourished. No distress.  HENT:  Head: Normocephalic and atraumatic.  Eyes: Conjunctivae are normal.  Neck: Neck supple.  Cardiovascular: Normal rate, regular rhythm and normal heart sounds.  No murmur heard. Pulmonary/Chest: Effort normal and breath sounds normal. No respiratory distress.  Abdominal: Soft. There is no tenderness.  Gravid abdomen  Musculoskeletal: She exhibits no edema.  Neurological: She is alert.  Skin: Skin is warm and dry.  TTP, induration, to the left gluteal cleft with small amount of induration to right gluteal cleft as well. Central area of fluctuance noted. No tracking of induration, TTP, or erythema towards to anus or rectal area.  Psychiatric: She has a normal mood and affect.  Nursing note and vitals reviewed.   ED Treatments / Results  Labs (all labs ordered are listed, but only abnormal results are displayed) Labs Reviewed - No data to display  EKG None  Radiology No results found.  Procedures Procedures (including critical care time)  Medications Ordered in ED Medications  lidocaine (XYLOCAINE) 2 % (with pres) injection 200 mg (200 mg Intradermal Given by Other 05/06/18 3267)     Initial Impression / Assessment and Plan / ED Course  I have reviewed the triage vital signs and the nursing notes.  Pertinent labs & imaging results that were available during my care of the patient were reviewed by me and considered in my medical decision making (see chart for details).     Final Clinical Impressions(s) / ED Diagnoses   Final diagnoses:  Cellulitis, unspecified cellulitis site   Pt presenting with concern for gluteal abscess. Has ttp, induration, and mild fluctuance to the left and mildly on right gluteal cleft. No tracking towards anus. No fevers or signs of systemic  infection.  FHT present at 150s. abd soft and  nontender.   Attempted I&d and scant amount of bloody/slightly purulent drainage obtained. Attempted to probe and deloculate without significant drainage. Will place on abx and have pt f/u closely with pcp. Advised to return if worse. All questions answered and pt understands plan.  ED Discharge Orders         Ordered    clindamycin (CLEOCIN) 300 MG capsule  3 times daily     05/06/18 0915           Rodney Booze, PA-C 05/06/18 0915    Veryl Speak, MD 05/06/18 [phone]

## 2018-05-06 NOTE — Progress Notes (Signed)
PRENATAL VISIT NOTE  Subjective:  Traci Peterson is a 32 y.o. G3P0200 at 344w2d being seen today for ongoing prenatal care.  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  Patient reports backache and occasional contractions.  Contractions: Not present. Vag. Bleeding: None.  Movement: Present. Denies leaking of fluid.   The following portions of the patient's history were reviewed and updated as appropriate: allergies, current medications, past family history, past medical history, past social history, past surgical history and problem list. Problem list updated.  Objective:   Vitals:   05/06/18 1324  BP: 113/64  Pulse: 97  Weight: 131.1 kg    Fetal Status: Fetal Heart Rate (bpm): 139   Movement: Present     General:  Alert, oriented and cooperative. Patient is in no acute distress.  Skin: Skin is warm and dry. No rash noted.   Cardiovascular: Normal heart rate noted  Respiratory: Normal respiratory effort, no problems with respiration noted  Abdomen: Soft, gravid, appropriate for gestational age.  Pain/Pressure: Absent     Pelvic: Cervical exam deferred       SSE performed to evaluate cervix and cervix visually closed, thick. Cerclage stitch not well visualized on today's exam.  Extremities: Normal range of motion.  Edema:  None  Mental Status: Normal mood and affect. Normal behavior. Normal judgment and thought content.   Assessment and Plan:  Pregnancy: G3P0200 at 5644w2d  1. Supervision of high risk pregnancy, antepartum --Anticipatory guidance about next visits/weeks of pregnancy given.  2. Incompetent cervix --Cervix visualized with SSE at pt request due to recent irregular cramps contractions and is closed/thick visually. --Increase PO fluids --Treating infections (I&D of pilonidal cyst and vaginal yeast infections may improve cramping)  3. Pre-existing type 2 diabetes mellitus during pregnancy, antepartum --Pt reports she is not eating or taking her sugars like she should. She does not have log today.   --Metformin is causing nausea and she wants to talk about starting on insulin.   --Message sent for pt to return to diabetic education, or see attending ASAP if appt delayed due to the holidays.  Pt to continue Metformin until appt.  4. Pilonidal cyst with abscess --[facility] Pt doing well.  Rx for clindamycin TID.  Verified safety in pregnancy with pt.   --Rx for Terazol 7 with refill at pt request for yeast infection symptoms and frequent infections with antibiotics.   Preterm labor symptoms and general obstetric precautions including but not limited to vaginal bleeding, contractions, leaking of fluid and fetal movement were reviewed in detail with the patient. Please refer to After Visit Summary for other counseling recommendations.  No follow-ups on file.  Future Appointments  Date Time Provider Department Center  05/12/2018  8:15 AM [facility] US 4 [facility]  05/12/2018 10:30 AM [facility] NURSE [facility]  05/19/2018  2:30 PM Allie Bossierove, Myra C, MD [facility]  05/26/2018  4:00 PM [facility] NURSE  [facility]    Sharen Counter, CNM

## 2018-05-06 NOTE — Telephone Encounter (Signed)
Called pt to inform her that the office will schedule her for diabetic education or in our office with an attending to start her on insulin. Pt to take Metformin as prescribed, continue ADA diet, and take CBGs as directed until changes are made.  Pt states understanding.

## 2018-05-06 NOTE — Discharge Instructions (Addendum)
You were given a prescription for antibiotics. Please take the antibiotic prescription fully.   Please follow up in 48 hours for recheck.   Please return to the emergency room immediately if you experience any new or worsening symptoms or any symptoms that indicate worsening infection such as fevers, increased redness/swelling/pain, warmth, or drainage from the affected area.

## 2018-05-06 NOTE — ED Notes (Signed)
Patient given discharge teaching and verbalized understanding. Patient ambulated out of ED with a steady gait. 

## 2018-05-06 NOTE — Patient Instructions (Signed)
Third Trimester of Pregnancy The third trimester is from week 28 through week 40 (months 7 through 9). The third trimester is a time when the unborn baby (fetus) is growing rapidly. At the end of the ninth month, the fetus is about 20 inches in length and weighs 6-10 pounds. Body changes during your third trimester Your body will continue to go through many changes during pregnancy. The changes vary from woman to woman. During the third trimester:  Your weight will continue to increase. You can expect to gain 25-35 pounds (11-16 kg) by the end of the pregnancy.  You may begin to get stretch marks on your hips, abdomen, and breasts.  You may urinate more often because the fetus is moving lower into your pelvis and pressing on your bladder.  You may develop or continue to have heartburn. This is caused by increased hormones that slow down muscles in the digestive tract.  You may develop or continue to have constipation because increased hormones slow digestion and cause the muscles that push waste through your intestines to relax.  You may develop hemorrhoids. These are swollen veins (varicose veins) in the rectum that can itch or be painful.  You may develop swollen, bulging veins (varicose veins) in your legs.  You may have increased body aches in the pelvis, back, or thighs. This is due to weight gain and increased hormones that are relaxing your joints.  You may have changes in your hair. These can include thickening of your hair, rapid growth, and changes in texture. Some women also have hair loss during or after pregnancy, or hair that feels dry or thin. Your hair will most likely return to normal after your baby is born.  Your breasts will continue to grow and they will continue to become tender. A yellow fluid (colostrum) may leak from your breasts. This is the first milk you are producing for your baby.  Your belly button may stick out.  You may notice more swelling in your hands,  face, or ankles.  You may have increased tingling or numbness in your hands, arms, and legs. The skin on your belly may also feel numb.  You may feel short of breath because of your expanding uterus.  You may have more problems sleeping. This can be caused by the size of your belly, increased need to urinate, and an increase in your body's metabolism.  You may notice the fetus "dropping," or moving lower in your abdomen (lightening).  You may have increased vaginal discharge.  You may notice your joints feel loose and you may have pain around your pelvic bone.  What to expect at prenatal visits You will have prenatal exams every 2 weeks until week 36. Then you will have weekly prenatal exams. During a routine prenatal visit:  You will be weighed to make sure you and the baby are growing normally.  Your blood pressure will be taken.  Your abdomen will be measured to track your baby's growth.  The fetal heartbeat will be listened to.  Any test results from the previous visit will be discussed.  You may have a cervical check near your due date to see if your cervix has softened or thinned (effaced).  You will be tested for Group B streptococcus. This happens between 35 and 37 weeks.  Your health care provider may ask you:  What your birth plan is.  How you are feeling.  If you are feeling the baby move.  If you have had   any abnormal symptoms, such as leaking fluid, bleeding, severe headaches, or abdominal cramping.  If you are using any tobacco products, including cigarettes, chewing tobacco, and electronic cigarettes.  If you have any questions.  Other tests or screenings that may be performed during your third trimester include:  Blood tests that check for low iron levels (anemia).  Fetal testing to check the health, activity level, and growth of the fetus. Testing is done if you have certain medical conditions or if there are problems during the  pregnancy.  Nonstress test (NST). This test checks the health of your baby to make sure there are no signs of problems, such as the baby not getting enough oxygen. During this test, a belt is placed around your belly. The baby is made to move, and its heart rate is monitored during movement.  What is false labor? False labor is a condition in which you feel small, irregular tightenings of the muscles in the womb (contractions) that usually go away with rest, changing position, or drinking water. These are called Braxton Hicks contractions. Contractions may last for hours, days, or even weeks before true labor sets in. If contractions come at regular intervals, become more frequent, increase in intensity, or become painful, you should see your health care provider. What are the signs of labor?  Abdominal cramps.  Regular contractions that start at 10 minutes apart and become stronger and more frequent with time.  Contractions that start on the top of the uterus and spread down to the lower abdomen and back.  Increased pelvic pressure and dull back pain.  A watery or bloody mucus discharge that comes from the vagina.  Leaking of amniotic fluid. This is also known as your "water breaking." It could be a slow trickle or a gush. Let your health care provider know if it has a color or strange odor. If you have any of these signs, call your health care provider right away, even if it is before your due date. Follow these instructions at home: Medicines  Follow your health care provider's instructions regarding medicine use. Specific medicines may be either safe or unsafe to take during pregnancy.  Take a prenatal vitamin that contains at least 600 micrograms (mcg) of folic acid.  If you develop constipation, try taking a stool softener if your health care provider approves. Eating and drinking  Eat a balanced diet that includes fresh fruits and vegetables, whole grains, good sources of protein  such as meat, eggs, or tofu, and low-fat dairy. Your health care provider will help you determine the amount of weight gain that is right for you.  Avoid raw meat and uncooked cheese. These carry germs that can cause birth defects in the baby.  If you have low calcium intake from food, talk to your health care provider about whether you should take a daily calcium supplement.  Eat four or five small meals rather than three large meals a day.  Limit foods that are high in fat and processed sugars, such as fried and sweet foods.  To prevent constipation: ? Drink enough fluid to keep your urine clear or pale yellow. ? Eat foods that are high in fiber, such as fresh fruits and vegetables, whole grains, and beans. Activity  Exercise only as directed by your health care provider. Most women can continue their usual exercise routine during pregnancy. Try to exercise for 30 minutes at least 5 days a week. Stop exercising if you experience uterine contractions.  Avoid heavy   lifting.  Do not exercise in extreme heat or humidity, or at high altitudes.  Wear low-heel, comfortable shoes.  Practice good posture.  You may continue to have sex unless your health care provider tells you otherwise. Relieving pain and discomfort  Take frequent breaks and rest with your legs elevated if you have leg cramps or low back pain.  Take warm sitz baths to soothe any pain or discomfort caused by hemorrhoids. Use hemorrhoid cream if your health care provider approves.  Wear a good support bra to prevent discomfort from breast tenderness.  If you develop varicose veins: ? Wear support pantyhose or compression stockings as told by your healthcare provider. ? Elevate your feet for 15 minutes, 3-4 times a day. Prenatal care  Write down your questions. Take them to your prenatal visits.  Keep all your prenatal visits as told by your health care provider. This is important. Safety  Wear your seat belt at  all times when driving.  Make a list of emergency phone numbers, including numbers for family, friends, the hospital, and police and fire departments. General instructions  Avoid cat litter boxes and soil used by cats. These carry germs that can cause birth defects in the baby. If you have a cat, ask someone to clean the litter box for you.  Do not travel far distances unless it is absolutely necessary and only with the approval of your health care provider.  Do not use hot tubs, steam rooms, or saunas.  Do not drink alcohol.  Do not use any products that contain nicotine or tobacco, such as cigarettes and e-cigarettes. If you need help quitting, ask your health care provider.  Do not use any medicinal herbs or unprescribed drugs. These chemicals affect the formation and growth of the baby.  Do not douche or use tampons or scented sanitary pads.  Do not cross your legs for long periods of time.  To prepare for the arrival of your baby: ? Take prenatal classes to understand, practice, and ask questions about labor and delivery. ? Make a trial run to the hospital. ? Visit the hospital and tour the maternity area. ? Arrange for maternity or paternity leave through employers. ? Arrange for family and friends to take care of pets while you are in the hospital. ? Purchase a rear-facing car seat and make sure you know how to install it in your car. ? Pack your hospital bag. ? Prepare the baby's nursery. Make sure to remove all pillows and stuffed animals from the baby's crib to prevent suffocation.  Visit your dentist if you have not gone during your pregnancy. Use a soft toothbrush to brush your teeth and be gentle when you floss. Contact a health care provider if:  You are unsure if you are in labor or if your water has broken.  You become dizzy.  You have mild pelvic cramps, pelvic pressure, or nagging pain in your abdominal area.  You have lower back pain.  You have persistent  nausea, vomiting, or diarrhea.  You have an unusual or bad smelling vaginal discharge.  You have pain when you urinate. Get help right away if:  Your water breaks before 37 weeks.  You have regular contractions less than 5 minutes apart before 37 weeks.  You have a fever.  You are leaking fluid from your vagina.  You have spotting or bleeding from your vagina.  You have severe abdominal pain or cramping.  You have rapid weight loss or weight gain.    You have shortness of breath with chest pain.  You notice sudden or extreme swelling of your face, hands, ankles, feet, or legs.  Your baby makes fewer than 10 movements in 2 hours.  You have severe headaches that do not go away when you take medicine.  You have vision changes. Summary  The third trimester is from week 28 through week 40, months 7 through 9. The third trimester is a time when the unborn baby (fetus) is growing rapidly.  During the third trimester, your discomfort may increase as you and your baby continue to gain weight. You may have abdominal, leg, and back pain, sleeping problems, and an increased need to urinate.  During the third trimester your breasts will keep growing and they will continue to become tender. A yellow fluid (colostrum) may leak from your breasts. This is the first milk you are producing for your baby.  False labor is a condition in which you feel small, irregular tightenings of the muscles in the womb (contractions) that eventually go away. These are called Braxton Hicks contractions. Contractions may last for hours, days, or even weeks before true labor sets in.  Signs of labor can include: abdominal cramps; regular contractions that start at 10 minutes apart and become stronger and more frequent with time; watery or bloody mucus discharge that comes from the vagina; increased pelvic pressure and dull back pain; and leaking of amniotic fluid. This information is not intended to replace advice  given to you by your health care provider. Make sure you discuss any questions you have with your health care provider. Document Released: 05/08/2001 Document Revised: 10/20/2015 Document Reviewed: 07/15/2012 Elsevier Interactive Patient Education  2017 [employer]  

## 2018-05-07 NOTE — Addendum Note (Signed)
Addended by: Granville LewisLARK, Derek Laughter L on: 05/07/2018 08:06 AM   Modules accepted: Orders

## 2018-05-08 ENCOUNTER — Encounter: Payer: Self-pay | Admitting: Registered"

## 2018-05-08 ENCOUNTER — Encounter: Payer: Medicaid Other | Attending: Obstetrics & Gynecology | Admitting: Registered"

## 2018-05-08 DIAGNOSIS — Z713 Dietary counseling and surveillance: Secondary | ICD-10-CM | POA: Diagnosis not present

## 2018-05-08 DIAGNOSIS — O24415 Gestational diabetes mellitus in pregnancy, controlled by oral hypoglycemic drugs: Secondary | ICD-10-CM

## 2018-05-08 DIAGNOSIS — O24414 Gestational diabetes mellitus in pregnancy, insulin controlled: Secondary | ICD-10-CM | POA: Insufficient documentation

## 2018-05-08 DIAGNOSIS — O24113 Pre-existing diabetes mellitus, type 2, in pregnancy, third trimester: Secondary | ICD-10-CM | POA: Diagnosis not present

## 2018-05-08 NOTE — Progress Notes (Signed)
Diabetes Self-Management Education  Visit Type: Follow-up  Appt. Start Time: 1000 Appt. End Time: 1100  05/08/2018  [name], identified by name and date of birth, is a 32 y.o. female with a diagnosis of Diabetes:  .   ASSESSMENT Pt returns due to BG is no longer controlled during pregnancy. Pt states she is worried about the health of her baby especially since she has had 2 prior pregnancies that she was not able to carry to term. Pt states she requested insulin since she has 13 weeks left until her due date, but states her MD wanted her to come in for more nutrition counseling before starting insulin.  Pt states her activity has decreased and her stress has increased. Pt states she has not worked since October, not by her choice, and has created more of a financial burden.   Pt states she stopped taking metformin for a few days because she was not tollerating it but believes that since she has switch GERD medication she should be able to take metformin again. Pt states before recent life changes her BG were less than 120 - FBG 80-96; after meals 120, but her taste buds have changed and she does not have a desire to eat most foods, even though she enjoyed them before pregnancy. Pt states she has been eating more fried foods and drinking sweet beverages.   PT states she is drinking more water, doesn't care soda or sweet tea, but likes peppermint tea, adds honey & sugar to make it sweet, as well as other warm drinks likes them to taste like sweet coffee.  Patient states she has 2 dogs and misses going to dog park. Pt states for safety reasons she does not play with dogs because they jump on her.   Diabetes Self-Management Education - 05/08/18 1011      Visit Information   Visit Type  Follow-up      Dietary Intake   Breakfast  none    Snack (morning)  3 hot wings, egg nog    Lunch  ramen noodles   2 pm   Dinner  fried food potato wedges, livers, wing, ranch   1 am   Beverage(s)  egg nog, hot choc, water      Exercise   Exercise Type  ADL's    How many days per week to you exercise?  0    How many minutes per day do you exercise?  0    Total minutes per week of exercise  0      Individualized Goals (developed by patient)   Nutrition  General guidelines for healthy choices and portions discussed    Medications  take my medication as prescribed    Monitoring   test my blood glucose as discussed      Outcomes   Expected Outcomes  Demonstrated interest in learning. Expect positive outcomes    Future DMSE  PRN    Program Status  Completed      Subsequent Visit   Since your last visit have you continued or begun to take your medications as prescribed?  No   metformin causing nausea   Since your last visit have you had your blood pressure checked?  Yes    Is your most recent blood pressure lower, unchanged, or higher since your last visit?  Lower   runs low   Since your last visit, are you checking your blood glucose at least once a day?  No  Individualized Plan for Diabetes Self-Management Training:   Learning Objective:  Patient will have a greater understanding of diabetes self-management. Patient education plan is to attend individual and/or group sessions per assessed needs and concerns.  Patient Instructions  Consider trying your overnight oats again using [place] yogurt for protein [employer] bowl 1/2 serving should be okay for your blood sugar, consider just having water. To help increase your energy, aim to have regular snacks and meals to avoid getting really hungry or tired which may be a trigger for eating too many sweets. Work with your husband to get time to walk around the block as tolerated. You are getting a lot of extra carbs from the soda, egg nog, hot chocolate Remember to include protein when eating sweet potatoes Refer back to your Gestational Diabetes packet for ideas of balanced meals and snacks. If you are interested look into  getting the Accu-chek meter and downloading MySgr app and get back into the habit of checking your BG 4x day.   Expected Outcomes:  Demonstrated interest in learning. Expect positive outcomes  Education material provided: Gestational Diabetes packet, snack sheet, carb counting card.  If problems or questions, patient to contact team via:  Phone  Future DSME appointment: PRN

## 2018-05-08 NOTE — Patient Instructions (Addendum)
Consider trying your overnight oats again using [place] yogurt for protein [employer] bowl 1/2 serving should be okay for your blood sugar, consider just having water. To help increase your energy, aim to have regular snacks and meals to avoid getting really hungry or tired which may be a trigger for eating too many sweets. Work with your husband to get time to walk around the block as tolerated. You are getting a lot of extra carbs from the soda, egg nog, hot chocolate Remember to include protein when eating sweet potatoes Refer back to your Gestational Diabetes packet for ideas of balanced meals and snacks. If you are interested look into getting the Accu-chek meter and downloading MySgr app and get back into the habit of checking your BG 4x day.

## 2018-05-09 IMAGING — US US MFM OB LIMITED
1 series · 14 of 28 positions shown · non-contrast
Comparison: none

[Series 1: us mfm ob limited · 32 acquisitions, 14 frames shown]
[im 2/32]
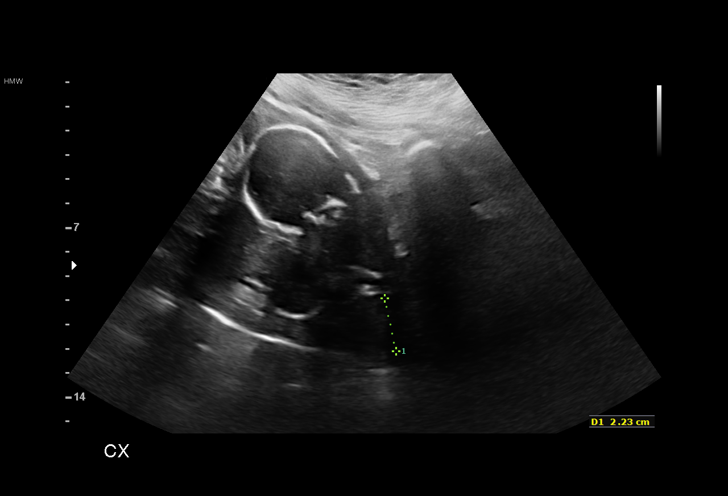
[im 4/32]
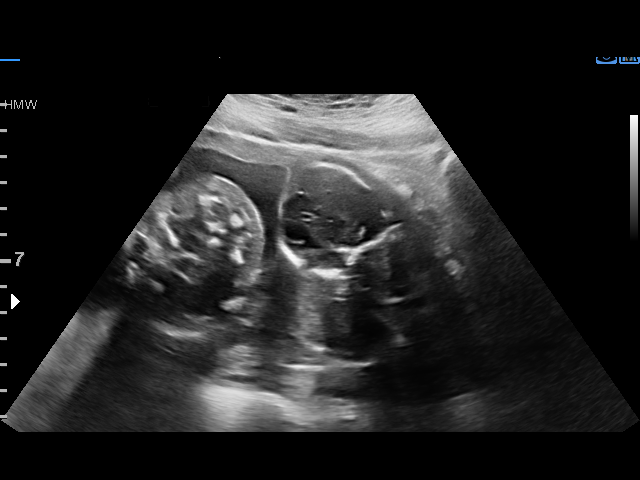
[im 6/32]
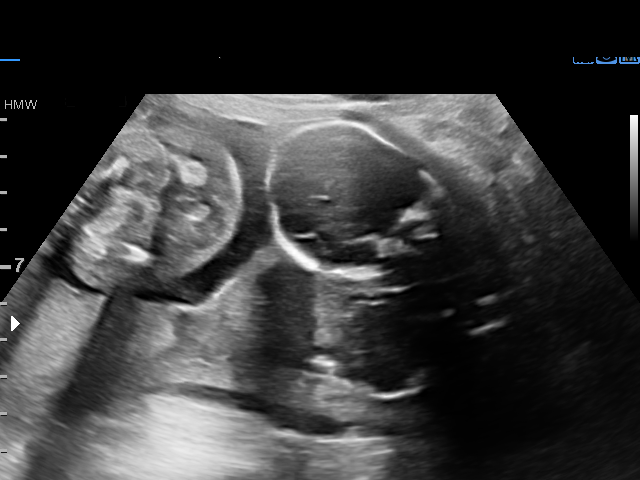
[im 9/32]
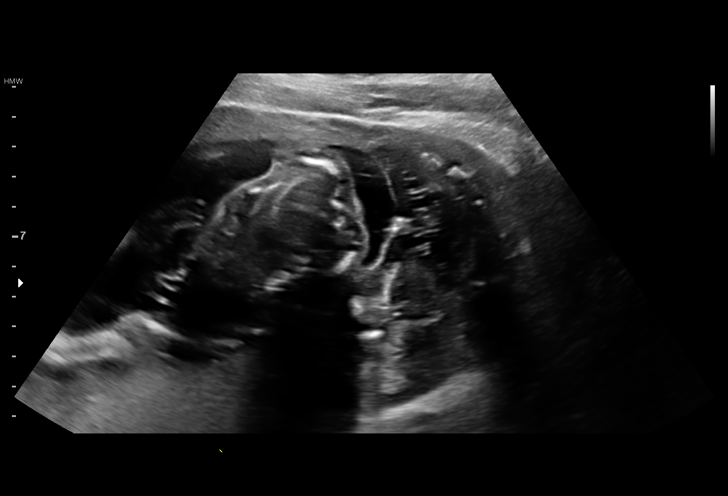
[im 11/32]
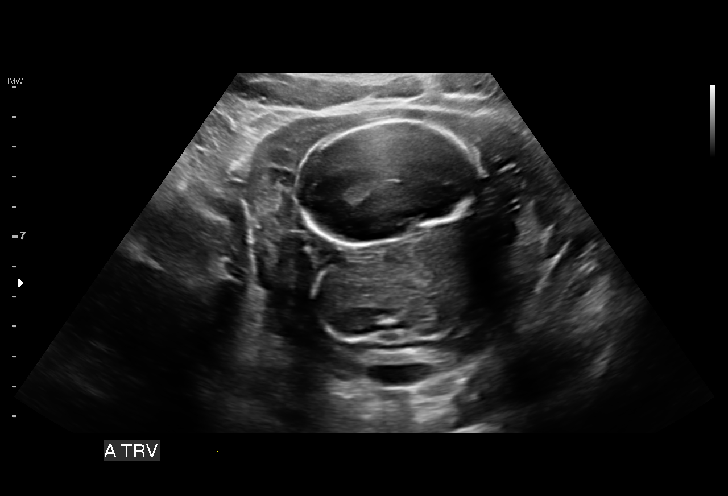
[im 13/32]
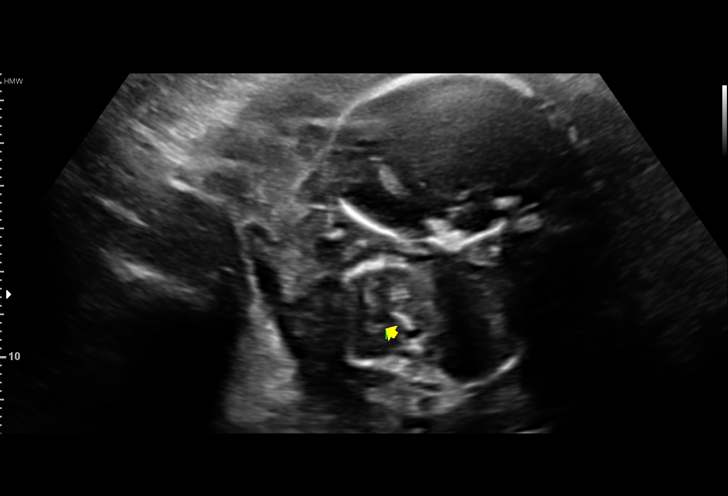
[im 15/32]
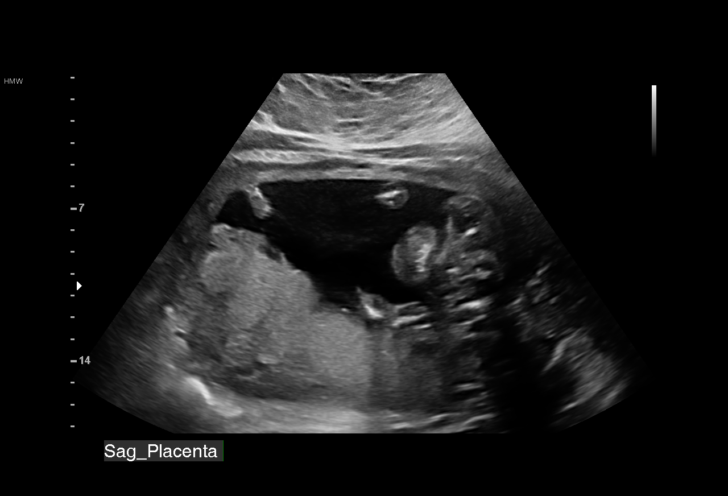
[im 18/32]
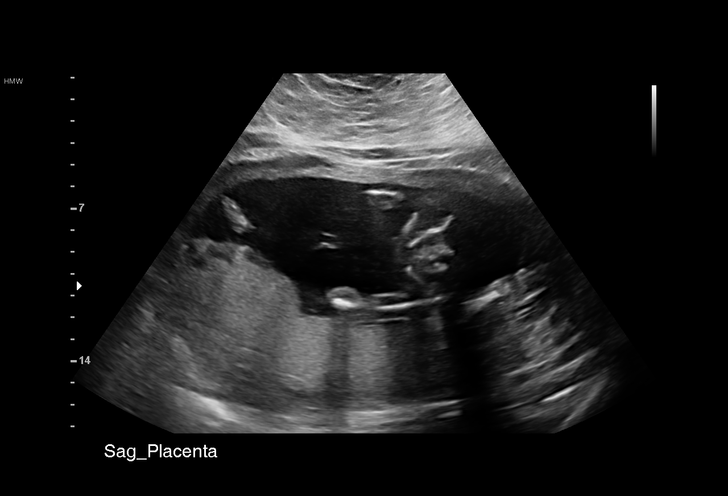
[im 20/32]
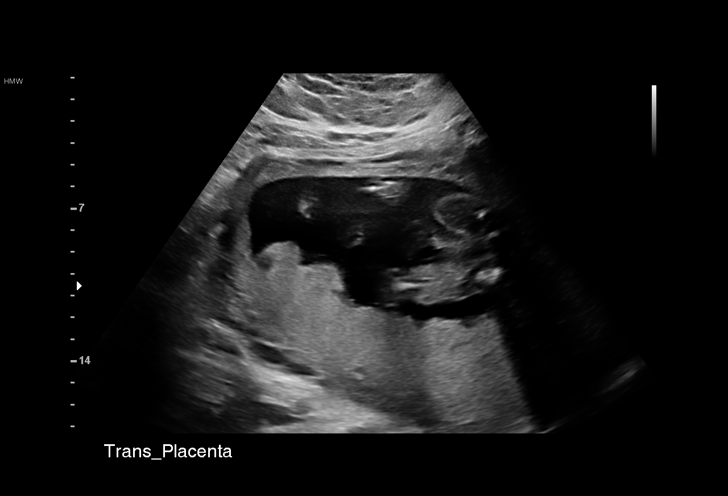
[im 22/32]
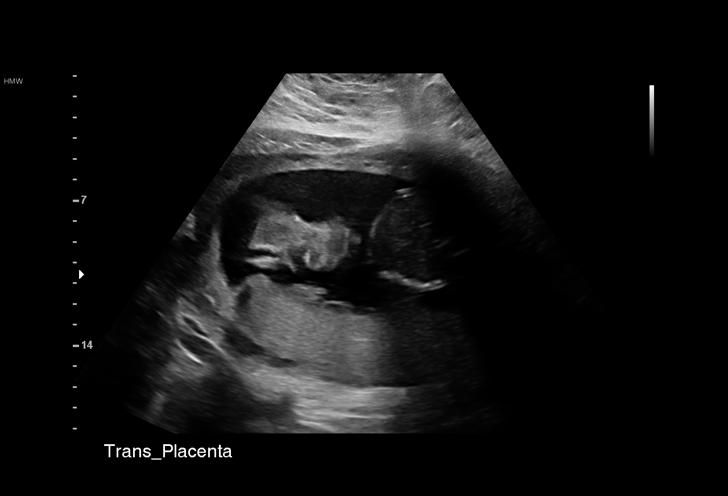
[im 25/32]
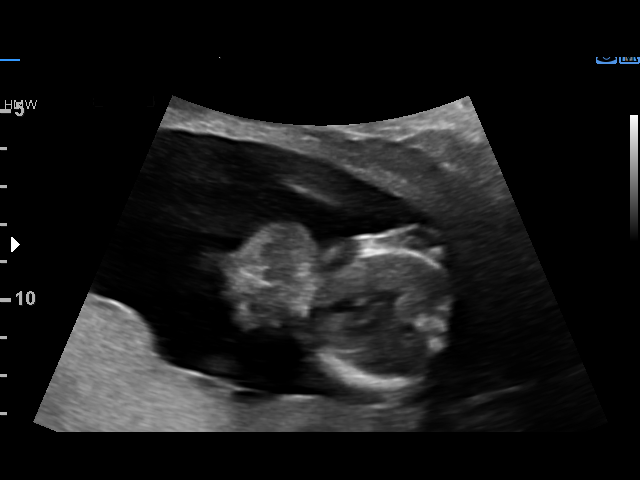
[im 27/32]
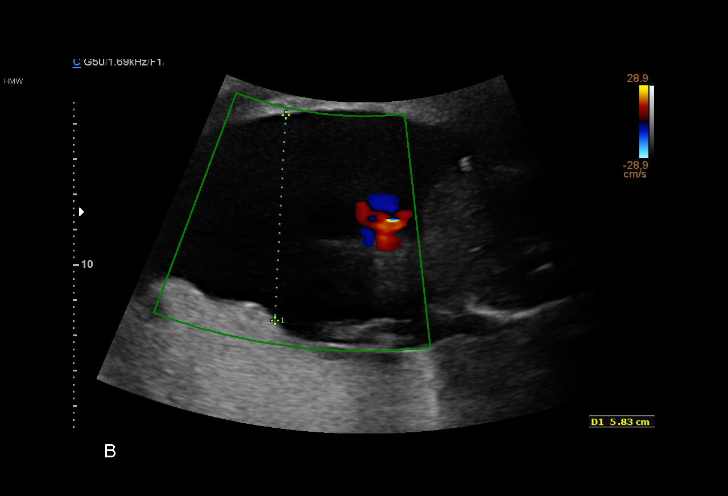
[im 29/32]
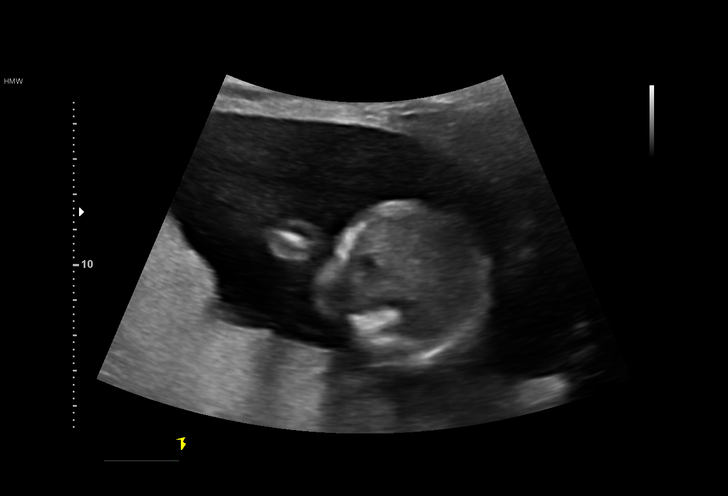
[im 32/32]
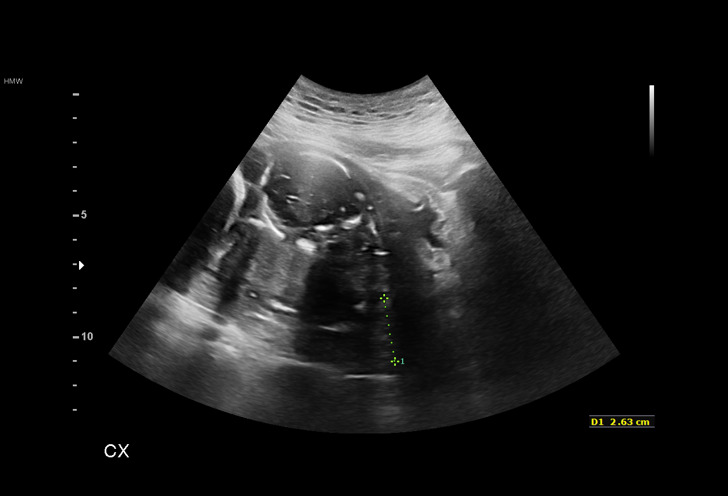

[14 of 28 positions shown; findings below may reference images not displayed]

[REDACTED]

1  SHIGEHITO ZUFAR            [id number]      [id number]     [id number]
Indications

20 weeks gestation of pregnancy
Obesity complicating pregnancy, second
trimester
Poor obstetric history: Previous preterm
delivery, antepartum (21 weeks)
Twin pregnancy, di/di, second trimester
Premature rupture of membranes - leaking
fluid (baby A)
OB History

Blood Type:            Height:  5'2"   Weight (lb):  270      BMI:
Gravidity:    2         Prem:   1
Living:       0
Fetal Evaluation (Fetus A)

Num Of Fetuses:     2
Fetal Heart         149
Rate(bpm):
Cardiac Activity:   Observed
Fetal Lie:          Lower Fetus
Presentation:       Transverse, head to maternal right
Placenta:           Posterior, above cervical os
P. Cord Insertion:  Not well visualized
Amniotic Fluid
AFI FV:      Oligohydramnios

Largest Pocket(cm)
1.0
Gestational Age (Fetus A)

Best:          20w 0d    Det. By:   Previous Ultrasound      EDD:   01/09/17
(05/30/16)
Anatomy (Fetus A)

Stomach:               Appears normal, left   Bladder:                Appears normal
sided

Fetal Evaluation (Fetus B)

Num Of Fetuses:     2
Fetal Heart         157
Rate(bpm):
Cardiac Activity:   Observed
Fetal Lie:          Upper Fetus
Presentation:       Transverse, head to maternal right
Placenta:           Posterior, above cervical os
P. Cord Insertion:  Not well visualized

Amniotic Fluid
AFI FV:      Subjectively within normal limits

Largest Pocket(cm)
5.8
Gestational Age (Fetus B)

Best:          20w 0d    Det. By:   Previous Ultrasound      EDD:   01/09/17
(05/30/16)
Anatomy (Fetus B)

Stomach:               Appears normal, left   Bladder:                Appears normal
sided
Cervix Uterus Adnexa

Cervix
Length:            2.7  cm.
Normal appearance by transabdominal scan.

Uterus
No abnormality visualized.

Left Ovary
No adnexal mass visualized.

Right Ovary
No adnexal mass visualized.

Cul De Sac:   No free fluid seen.
Adnexa:       No abnormality visualized.
Impression

diamniotic dichorionic  twin gestation at 20+0 weeks, with
PPROM of twin A x 2 weeks
Twin A:
Anhydramnios noted
Normal fetal cardiac activity

Twin B:
Cephalic presentation
Normal amniotic fluid volume and cardiac activity
Recommendations

Weekly visits in clinic - frequent temperature checks at home.
Patient instructed to come to the hospital immediately for
fever (> 100 F), increased bleeding or cramping
Ultrasound with MFM in 2 week

## 2018-05-10 ENCOUNTER — Encounter (HOSPITAL_BASED_OUTPATIENT_CLINIC_OR_DEPARTMENT_OTHER): Payer: Self-pay | Admitting: Emergency Medicine

## 2018-05-10 ENCOUNTER — Emergency Department (HOSPITAL_BASED_OUTPATIENT_CLINIC_OR_DEPARTMENT_OTHER)
Admission: EM | Admit: 2018-05-10 | Discharge: 2018-05-10 | Disposition: A | Discharge status: Home / Self Care | Payer: Medicaid Other | Attending: Emergency Medicine | Admitting: Emergency Medicine

## 2018-05-10 ENCOUNTER — Other Ambulatory Visit: Payer: Self-pay

## 2018-05-10 DIAGNOSIS — L0591 Pilonidal cyst without abscess: Secondary | ICD-10-CM | POA: Insufficient documentation

## 2018-05-10 DIAGNOSIS — L0231 Cutaneous abscess of buttock: Secondary | ICD-10-CM | POA: Diagnosis present

## 2018-05-10 DIAGNOSIS — Z79899 Other long term (current) drug therapy: Secondary | ICD-10-CM | POA: Diagnosis not present

## 2018-05-10 DIAGNOSIS — E119 Type 2 diabetes mellitus without complications: Secondary | ICD-10-CM | POA: Insufficient documentation

## 2018-05-10 DIAGNOSIS — L0501 Pilonidal cyst with abscess: Secondary | ICD-10-CM

## 2018-05-10 DIAGNOSIS — Z794 Long term (current) use of insulin: Secondary | ICD-10-CM | POA: Diagnosis not present

## 2018-05-10 DIAGNOSIS — Z87891 Personal history of nicotine dependence: Secondary | ICD-10-CM | POA: Insufficient documentation

## 2018-05-10 MED ORDER — LIDOCAINE HCL (PF) 1 % IJ SOLN
5.0000 mL | Freq: Once | INTRAMUSCULAR | Status: AC
  Administered 2018-05-10: 5 mL
  Filled 2018-05-10: qty 5

## 2018-05-10 NOTE — ED Triage Notes (Signed)
[facility] and it has filled back up with fluid and is painful.

## 2018-05-10 NOTE — Discharge Instructions (Signed)
Today you were seen in the Emergency Department for an Abscess.  1. Medications: antibiotic prescribed in previous visit, usual home medications 2. Treatment: keep wound clean and dry. Apply warm compresses to arm pit throughout the day. Take medications as prescribed.  3. Follow Up: Follow up with your doctor, urgent care, or ER in 2 days for wound recheck and packing removal.  Please return to the ER for fevers, chills, nausea, vomiting or other signs of infection. Return to emergency department for emergent changing or worsening symptoms.  An abscess (boil or furuncle) is an infected area that contains a collection of pus. Sometimes foreign material, such as a splinter can cause an abscess. The body contains the infection within a tight pocket to try and prevent the infection from spreading to other areas of the body.   The treatment for most abscesses is to cut into (incise) the abscess and drain the infection inside.  In most cases, antibiotics are not needed and the abscess will get better quickly. Your provider has determined that antibiotics ARE necessary for treatment of your abscess.   Please take medications as prescribed.  Your abscess was lanced today, and has a small wick to help keep the skin open to allow further drainage.  Return to your doctor, urgent care, or the ER for recheck in 2 days.  Return sooner for worsening pain, fever, redness or persistent bleeding.  You may shower, but do not scrub the area, allow the water to run over the area.  Keep area covered with bandage after bathing.  Apply warm compresses to the affected area throughout the day. Have her return to emergency department for emergent changing or worsening symptoms.  You may return to the emergency department if you have a fever that persists greater than 101 or your abscess appears to become infected (growing surrounding redness and warmth). Do not operate any heavy machinery while on pain medications. Do not consume  alcohol on these medications either.  HOME CARE INSTRUCTIONS  Only take over-the-counter or prescription medicines for pain, discomfort, or fever as directed by your caregiver.  Take your antibiotics as directed if they were prescribed. Finish them even if you start to feel better.  Keep the skin and clothes clean around your abscess.  If the abscess was drained, you will need to use gauze dressing to collect any draining pus. Dressings will typically need to be changed 3 or more times a day.  The infection may spread by skin contact with others. Avoid skin contact as much as possible.  Practice good hygiene. This includes regular hand washing, cover any draining skin lesions, and do not share personal care items.  If you participate in sports, do not share athletic equipment, towels, whirlpools, or personal care items. Shower after every practice or tournament.  If a draining area cannot be adequately covered:  Do not participate in sports.  Children should not participate in day care until the wound has healed or drainage stops.  If your caregiver has given you a follow-up appointment, it is very important to keep that appointment. Not keeping the appointment could result in a much worse infection, chronic or permanent injury, pain, and disability. If there is any problem keeping the appointment, you must call back to this facility for assistance.   SEEK MEDICAL CARE IF:  You develop increased pain, swelling, redness, drainage, or bleeding in the wound site.  You develop signs of generalized infection including muscle aches, chills, fever, or a general  ill feeling.  You have an oral temperature above 102 F (38.9 C).   MAKE SURE YOU:  Understand these instructions.  Will watch your condition.  Will get help right away if you are not doing well or get worse.    *RESOURCE GUIDE  RESOURCE GUIDE  Dental Problems  Patients with Medicaid: [facility]                                             [facility] [phone]                                                       [id number] Phone:  [phone]                                                                         Phone:  [phone]  If unable to pay or uninsured, contact:  [facility] or [facility] to become qualified for the adult dental clinic.  Chronic Pain Problems Contact [facility]  [phone] Patients need to be referred by their primary care doctor.  Insufficient Money for Medicine Contact United Way:  call "211" or [facility] [phone].  No Primary Care Doctor Call [facility]  [phone] Other agencies that provide inexpensive medical care    [facility]  [phone]    [facility]  [phone]    [facility]  [phone]    [facility]  [phone]    [facility]  [phone]    [facility]  [phone]  Psychological Services [facility]  [phone] [facility]  [phone] [facility]   [phone] (emergency services [phone]  Abuse/Neglect [facility] Child [facility] Hotline [phone] [facility] Hotline [phone] (After Hours)  Emergency Shelter [facility] [phone]  [facility] [phone] [facility] [phone]  MRSA Hotline #:   (925)380-5203(347)715-6245  [facility]  [facility]     [employer]                          [facility] [address] [place]                        [address]          [address]  [place]                                                [place]  [facility] Phone:  [phone]                                     Phone:  [phone]                   Phone:  [phone]  [facility] Phone:   [phone]  [facility] Child Abuse Hotline 208 584 1988 279-029-2444 (After Hours)   Community Resources: *IF YOU ARE IN IMMEDIATE DANGER CALL 911!  Abuse/Neglect:  Family Services Crisis Hotline [facility]): [phone] Center Against [facility]): [phone]  After hours, holidays and weekends: 862-378-2637 National Domestic Violence Hotline: 604-147-3109  Mental Health: [facility]: Drucie Ip: [phone]  Health Clinics:  [facility]): [phone] Monday - Friday 8 AM - 9 PM, Saturday and Sunday 10 AM - 9 PM  [facility] [place]: [phone] Monday - Friday 8 AM - 5 PM  [facility]  [address]: [phone] Monday- Friday 8:30 AM - 5:30 PM, Sat 9 AM - 1 PM  24 HR [facility] [address]: [phone] [facility]: [phone] [facility] [address]: [phone]  24 HR [facility]: [address] [phone]  Cultures: If culture results are positive, we will notify you if a change in treatment is necessary.  LABORATORY TESTS: If you had any labs drawn in the ED that have not resulted by the time you are discharged home, we will review these lab results and the treatment given to you.  If there is any further treatment or notification needed, we will contact you by phone, or letter.  "PLEASE ENSURE THAT YOU HAVE GIVEN [place] YOUR CURRENT WORKING PHONE NUMBER AND YOUR CURRENT ADDRESS, so that we can contact you if needed."  Our doctors and staff appreciate your choosing [place] for your emergency medical care needs. We are here to serve you.

## 2018-05-10 NOTE — ED Notes (Signed)
Pt is [redacted] weeks pregnant, she has a large fluctuant area to her left buttock and gluteal fold that has gotten larger and more painful since it was I&D yesterday. Pt endorses multiple issues with same and HX of surgical procedure in past.

## 2018-05-10 NOTE — ED Provider Notes (Signed)
[facility] EMERGENCY DEPARTMENT Provider Note   CSN: [id number] Arrival date & time: 05/10/18  1116     History   Chief Complaint Chief Complaint  Patient presents with  . Abscess    HPI Traci Peterson is a 32 y.o. female who is [redacted] weeks pregnant and has PMH of diabetes presenting with a pilonidal abscess onset 1 week ago. Patient describes pain as tenderness and states it is non radiating. Patient states pain is worse with laying down and better with sitting up. Patient states she had a pilonidal abscess in 2013 and required surgery by Dr. Ninfa Linden. Patient states she has not had a recurrence until 1 week ago. Patient states she was evaluated at [facility] 2 days ago and had an incision and drainage. Patient states abscess has worsened since visit. Patient was prescribed antibiotics, but patient states she did not take them yet. Patient denies fever, chills, abdominal pain, nausea, vomiting, shortness of breath, or vaginal bleeding.   HPI  Past Medical History:  Diagnosis Date  . Anxiety    no current med.  . Cervix prolapsed into vagina   . Chronic headaches    otc med prn  . Constipation   . Depression    no meds currently  . Diabetes mellitus without complication ([place])    type 2  . Gall stones    Resolved with surgery  . GERD (gastroesophageal reflux disease)   . HPV in female   . Jaw snapping    states jaw pops if opens mouth too wide  . Ovarian cyst   . PCOS (polycystic ovarian syndrome)   . Pilonidal cyst 02/2013  . Preterm labor 2016   PPROM @ 18 wks, delivery of IUFD @ 21 wks  . Seasonal allergies   . SVD (spontaneous vaginal delivery)    x 2 - both fetal demises 2nd trim.  . Vaginal Pap smear, abnormal     Patient Active Problem List   Diagnosis Date Noted  . Gestational diabetes mellitus (GDM) in third trimester controlled on oral hypoglycemic drug 05/08/2018  . Heartburn in pregnancy 02/13/2018  . Trichomonal vaginitis during pregnancy  01/20/2018  . Supervision of high risk pregnancy, antepartum 01/01/2018  . Pre-existing type 2 diabetes mellitus during pregnancy, antepartum 12/16/2017  . Type 2 diabetes mellitus ([place]) 12/15/2017  . Vitamin D deficiency 12/10/2017  . MDD (major depressive disorder), recurrent episode, severe ([place]) 05/29/2017  . Incompetent cervix 09/12/2016  . Gall stones 07/19/2016  . Family history of breast cancer in female 06/06/2016  . Obesity in pregnancy 10/25/2014  . Eczema 10/25/2014  . 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 Chronic headaches -  no meds 10/25/2014  . Anxiety and depression 11/30/2013  . PCOS (polycystic ovarian syndrome) 04/20/2013    Past Surgical History:  Procedure Laterality Date  . CERVICAL CERCLAGE N/A 01/21/2018   Procedure: CERCLAGE CERVICAL;  Surgeon: Guss Bunde, MD;  Location: [facility];  Service: Gynecology;  Laterality: N/A;  . CHOLECYSTECTOMY N/A 07/21/2016   Procedure: LAPAROSCOPIC CHOLECYSTECTOMY;  Surgeon: Coralie Keens, MD;  Location: [facility];  Service: General;  Laterality: N/A;  . PILONIDAL CYST EXCISION N/A 03/12/2013   Procedure: CYST EXCISION PILONIDAL EXTENSIVE;  Surgeon: Harl Bowie, MD;  Location: [place];  Service: General;  Laterality: N/A;  . WISDOM TOOTH EXTRACTION       OB History    Gravida  3   Para  2   Term  0   Preterm  2   AB  0   Living  0     SAB  0   TAB  0   Ectopic  0   Multiple  1   Live Births  2            Home Medications    Prior to Admission medications   Medication Sig Start Date End Date Taking? Authorizing Provider  ACCU-CHEK FASTCLIX LANCETS MISC 1 Units by Percutaneous route 4 (four) times daily. Patient not taking: Reported on 05/06/2018 11/30/17   Jorje Guild, NP  Blood Glucose Monitoring Suppl (ACCU-CHEK NANO SMARTVIEW) w/Device KIT 1 kit by Subdermal route as directed.  Check blood sugars for fasting, and two hours after breakfast, lunch and dinner (4 checks daily) Patient not taking: Reported on 05/06/2018 11/30/17   Jorje Guild, NP  Cholecalciferol (VITAMIN D3) 2000 units TABS Take 2,000 Units by mouth daily.    [provider]  clindamycin (CLEOCIN) 300 MG capsule Take 1 capsule (300 mg total) by mouth 3 (three) times daily for 7 days. Patient not taking: Reported on 05/06/2018 05/06/18 05/13/18  Couture, Cortni S, PA-C  docusate sodium (COLACE) 100 MG capsule Take 1 capsule (100 mg total) by mouth every 12 (twelve) hours. Patient not taking: Reported on 05/06/2018 12/15/17   Darlina Rumpf, CNM  Elastic Bandages & Supports (COMFORT FIT MATERNITY SUPP LG) MISC 1 Units by Does not apply route daily. Patient not taking: Reported on 05/06/2018 03/11/18   Lajean Manes, CNM  esomeprazole (NEXIUM) 20 MG capsule Take one tablet daily in the morning. 05/02/18   Guss Bunde, MD  famotidine (PEPCID) 20 MG tablet Take 1 tablet (20 mg total) by mouth 2 (two) times daily. Patient not taking: Reported on 05/06/2018 03/11/18   Lajean Manes, CNM  glucose blood (ACCU-CHEK SMARTVIEW) test strip Use as instructed to check blood sugars Patient not taking: Reported on 05/06/2018 11/30/17   Jorje Guild, NP  ibuprofen (ADVIL,MOTRIN) 600 MG tablet Take 1 tablet (600 mg total) by mouth every 6 (six) hours as needed. Patient not taking: Reported on 04/14/2018 02/08/18   Charlann Lange, PA-C  insulin glargine (LANTUS) 100 UNIT/ML injection Inject 0.1 mLs (10 Units total) into the skin at bedtime. Patient not taking: Reported on 04/07/2018 02/25/18   Guss Bunde, MD  metFORMIN (GLUCOPHAGE) 500 MG tablet Take 1 tablet (500 mg total) by mouth 2 (two) times daily with a meal. 11/30/17   Jorje Guild, NP  methocarbamol (ROBAXIN) 750 MG tablet Take 1 tablet (750 mg total) by mouth 3 (three) times daily. Patient not taking: Reported on 04/14/2018 02/08/18    Charlann Lange, PA-C  ondansetron (ZOFRAN) 4 MG tablet Take 1 tablet (4 mg total) by mouth every 8 (eight) hours as needed for nausea or vomiting. Patient not taking: Reported on 04/07/2018 02/15/18   Leftwich-Kirby, Lattie Haw A, CNM  polyethylene glycol (MIRALAX / GLYCOLAX) packet Take 17 g by mouth daily. Patient taking differently: Take 17 g by mouth daily as needed for moderate constipation.  12/15/17   Darlina Rumpf, CNM  Prenatal Multivit-Min-Fe-FA (PRENATAL VITAMINS PO) Take 1 tablet by mouth daily.     [provider]  ranitidine (ZANTAC) 150 MG tablet Take 1 tablet (150 mg total) by mouth 2 (two) times daily. Patient not taking: Reported on 04/14/2018 02/13/18 05/14/18  Virginia Rochester, NP    Family History Family  History  Problem Relation Age of Onset  . Hypertension Mother   . Hyperlipidemia Mother   . Diabetes Mother   . Thyroid disease Mother   . Diabetes Father   . Hypertension Father   . Alcohol abuse Father   . Breast cancer Maternal Aunt   . Lung cancer Paternal Grandfather     Social History Social History   Tobacco Use  . Smoking status: Former Smoker    Packs/day: 0.25    Years: 12.00    Pack years: 3.00    Types: Cigarettes    Last attempt to quit: 01/10/2018    Years since quitting: 0.3  . Smokeless tobacco: Never Used  Substance Use Topics  . Alcohol use: No    Frequency: Never    Comment: socially but none with pregnancy  . Drug use: Not Currently    Types: Hydrocodone    Comment: prescribed this medication     Allergies   Lactose intolerance (gi)   Review of Systems Review of Systems  Constitutional: Negative for chills, diaphoresis and fever.  Respiratory: Negative for cough and shortness of breath.   Cardiovascular: Negative for chest pain.  Gastrointestinal: Negative for abdominal pain, nausea and vomiting.  Endocrine: Negative for cold intolerance and heat intolerance.  Genitourinary: Negative for dysuria.    Musculoskeletal: Negative for back pain.  Skin: Positive for wound. Negative for rash.  Hematological: Negative for adenopathy.     Physical Exam Updated Vital Signs BP 95/80 (BP Location: Right Arm)   Pulse (!) 105   Temp 98 F (36.7 C) (Oral)   Resp 16   Ht 5' 2"  (1.575 m)   Wt 130.6 kg   LMP 10/27/2017 (Exact Date)   SpO2 100%   BMI 52.68 kg/m   Physical Exam Vitals signs and nursing note reviewed.  Constitutional:      General: She is not in acute distress.    Appearance: She is well-developed. She is not diaphoretic.  HENT:     Head: Normocephalic and atraumatic.  Cardiovascular:     Rate and Rhythm: Normal rate and regular rhythm.     Heart sounds: Normal heart sounds. No murmur. No friction rub. No gallop.   Pulmonary:     Effort: Pulmonary effort is normal. No respiratory distress.     Breath sounds: Normal breath sounds. No wheezing or rales.  Musculoskeletal: Normal range of motion.  Skin:    General: Skin is warm.     Findings: Erythema present. No rash.       Neurological:     Mental Status: She is alert and oriented to person, place, and time.      ED Treatments / Results  Labs (all labs ordered are listed, but only abnormal results are displayed) Labs Reviewed - No data to display  EKG None  Radiology No results found.  Procedures .Marland KitchenIncision and Drainage Date/Time: 05/10/2018 2:12 PM Performed by: Arville Lime, PA-C Authorized by: Arville Lime, PA-C   Consent:    Consent obtained:  Verbal   Consent given by:  Patient   Risks discussed:  Bleeding, incomplete drainage and infection   Alternatives discussed:  No treatment Location:    Type:  Abscess   Location:  Anogenital   Anogenital location:  Gluteal cleft Pre-procedure details:    Skin preparation:  Betadine Anesthesia (see MAR for exact dosages):    Anesthesia method:  Local infiltration   Local anesthetic:  Lidocaine 1% w/o epi Procedure type:  Complexity:   Simple Procedure details:    Incision types:  Stab incision   Scalpel blade:  11   Wound management:  Irrigated with saline and probed and deloculated   Drainage:  Bloody   Drainage amount:  Scant   Wound treatment:  Wound left open   Packing materials:  None Post-procedure details:    Patient tolerance of procedure:  Tolerated well, no immediate complications   (including critical care time)  Medications Ordered in ED Medications  lidocaine (PF) (XYLOCAINE) 1 % injection 5 mL (has no administration in time range)     Initial Impression / Assessment and Plan / ED Course  I have reviewed the triage vital signs and the nursing notes.  Pertinent labs & imaging results that were available during my care of the patient were reviewed by me and considered in my medical decision making (see chart for details).    Patient with skin abscess amenable to incision and drainage.  Abscess was not large enough to warrant packing or drain,  wound recheck in 2 days. Encouraged home warm soaks and flushing.  Mild signs of cellulitis is surrounding skin.  Will d/c to home. Discussed returning to ER if patient experiences new or worsening symptoms. Encouraged patient to start antibiotic as prescribed in last visit. Patient states she understands and agrees with plan.   Final Clinical Impressions(s) / ED Diagnoses   Final diagnoses:  Pilonidal abscess    ED Discharge Orders    None       Arville Lime, [place] 05/10/18 1417    Malvin Johns, MD 05/10/18 1435

## 2018-05-10 NOTE — ED Notes (Signed)
ED Provider at bedside. 

## 2018-05-12 ENCOUNTER — Encounter (HOSPITAL_COMMUNITY): Payer: Self-pay

## 2018-05-12 ENCOUNTER — Ambulatory Visit (HOSPITAL_COMMUNITY)
Admission: RE | Admit: 2018-05-12 | Discharge: 2018-05-12 | Disposition: A | Discharge status: Home / Self Care | Payer: Medicaid Other | Source: Ambulatory Visit | Attending: Nurse Practitioner | Admitting: Nurse Practitioner

## 2018-05-12 ENCOUNTER — Ambulatory Visit (INDEPENDENT_AMBULATORY_CARE_PROVIDER_SITE_OTHER): Payer: Medicaid Other

## 2018-05-12 DIAGNOSIS — O24013 Pre-existing diabetes mellitus, type 1, in pregnancy, third trimester: Secondary | ICD-10-CM | POA: Diagnosis not present

## 2018-05-12 DIAGNOSIS — O99213 Obesity complicating pregnancy, third trimester: Secondary | ICD-10-CM | POA: Diagnosis not present

## 2018-05-12 DIAGNOSIS — N883 Incompetence of cervix uteri: Secondary | ICD-10-CM

## 2018-05-12 DIAGNOSIS — O09213 Supervision of pregnancy with history of pre-term labor, third trimester: Secondary | ICD-10-CM | POA: Insufficient documentation

## 2018-05-12 DIAGNOSIS — O09212 Supervision of pregnancy with history of pre-term labor, second trimester: Secondary | ICD-10-CM | POA: Diagnosis not present

## 2018-05-12 DIAGNOSIS — Z3A28 28 weeks gestation of pregnancy: Secondary | ICD-10-CM

## 2018-05-12 DIAGNOSIS — O3433 Maternal care for cervical incompetence, third trimester: Secondary | ICD-10-CM

## 2018-05-12 DIAGNOSIS — O09219 Supervision of pregnancy with history of pre-term labor, unspecified trimester: Secondary | ICD-10-CM

## 2018-05-12 NOTE — Progress Notes (Signed)
Pt here for Makena injection. Injection given in right arm and tolerated well. Pt will return next week for next injection

## 2018-05-13 ENCOUNTER — Telehealth: Payer: Self-pay

## 2018-05-13 NOTE — Telephone Encounter (Signed)
PT called stating that she felt a "strong contraction" 30 minutes ago and went to the bathroom and there was some blood when she wiped. She stated it was not a lot of blood. I spoke with Donette LarryMelanie Bhambri, CNM who is in the office today and she said to tell the pt to rest for a while and then if she is still feeling pain or seeing bleeding then she should go to [facility]. I explained this to pt and she expressed understanding.

## 2018-05-16 ENCOUNTER — Other Ambulatory Visit: Payer: Self-pay

## 2018-05-16 ENCOUNTER — Inpatient Hospital Stay (HOSPITAL_COMMUNITY)
Admission: AD | Admit: 2018-05-16 | Discharge: 2018-05-16 | Disposition: A | Discharge status: Home / Self Care | Payer: Medicaid Other | Source: Ambulatory Visit | Attending: Family Medicine | Admitting: Family Medicine

## 2018-05-16 ENCOUNTER — Encounter (HOSPITAL_COMMUNITY): Payer: Self-pay | Admitting: *Deleted

## 2018-05-16 DIAGNOSIS — O26893 Other specified pregnancy related conditions, third trimester: Secondary | ICD-10-CM | POA: Diagnosis not present

## 2018-05-16 DIAGNOSIS — O99343 Other mental disorders complicating pregnancy, third trimester: Secondary | ICD-10-CM | POA: Diagnosis not present

## 2018-05-16 DIAGNOSIS — O99283 Endocrine, nutritional and metabolic diseases complicating pregnancy, third trimester: Secondary | ICD-10-CM | POA: Insufficient documentation

## 2018-05-16 DIAGNOSIS — N898 Other specified noninflammatory disorders of vagina: Secondary | ICD-10-CM | POA: Insufficient documentation

## 2018-05-16 DIAGNOSIS — F329 Major depressive disorder, single episode, unspecified: Secondary | ICD-10-CM | POA: Insufficient documentation

## 2018-05-16 DIAGNOSIS — Z79899 Other long term (current) drug therapy: Secondary | ICD-10-CM | POA: Diagnosis not present

## 2018-05-16 DIAGNOSIS — E119 Type 2 diabetes mellitus without complications: Secondary | ICD-10-CM | POA: Diagnosis not present

## 2018-05-16 DIAGNOSIS — O24113 Pre-existing diabetes mellitus, type 2, in pregnancy, third trimester: Secondary | ICD-10-CM | POA: Diagnosis not present

## 2018-05-16 DIAGNOSIS — K219 Gastro-esophageal reflux disease without esophagitis: Secondary | ICD-10-CM | POA: Diagnosis not present

## 2018-05-16 DIAGNOSIS — Z3A28 28 weeks gestation of pregnancy: Secondary | ICD-10-CM | POA: Diagnosis not present

## 2018-05-16 DIAGNOSIS — O99213 Obesity complicating pregnancy, third trimester: Secondary | ICD-10-CM | POA: Diagnosis not present

## 2018-05-16 DIAGNOSIS — Z87891 Personal history of nicotine dependence: Secondary | ICD-10-CM | POA: Diagnosis not present

## 2018-05-16 DIAGNOSIS — F419 Anxiety disorder, unspecified: Secondary | ICD-10-CM | POA: Insufficient documentation

## 2018-05-16 DIAGNOSIS — E282 Polycystic ovarian syndrome: Secondary | ICD-10-CM | POA: Insufficient documentation

## 2018-05-16 DIAGNOSIS — O99613 Diseases of the digestive system complicating pregnancy, third trimester: Secondary | ICD-10-CM | POA: Insufficient documentation

## 2018-05-16 DIAGNOSIS — Z794 Long term (current) use of insulin: Secondary | ICD-10-CM | POA: Insufficient documentation

## 2018-05-16 DIAGNOSIS — Z791 Long term (current) use of non-steroidal anti-inflammatories (NSAID): Secondary | ICD-10-CM | POA: Diagnosis not present

## 2018-05-16 LAB — URINALYSIS, ROUTINE W REFLEX MICROSCOPIC
Bilirubin Urine: NEGATIVE
Glucose, UA: >=500 mg/dL — AB
Hgb urine dipstick: NEGATIVE
Ketones, ur: 5 mg/dL — AB
Nitrite: NEGATIVE
PH: 6 (ref 5.0–8.0)
Protein, ur: NEGATIVE mg/dL
Specific Gravity, Urine: 1.02 (ref 1.005–1.030)

## 2018-05-16 LAB — AMNISURE RUPTURE OF MEMBRANE (ROM) NOT AT ARMC: Amnisure ROM: NEGATIVE

## 2018-05-16 NOTE — MAU Provider Note (Signed)
History    CSN: [id number] Arrival date and time: 05/16/18 2106 First Provider Initiated Contact with Patient 05/16/18 2139     Chief Complaint  Patient presents with  . Rupture of Membranes   HPI Traci Peterson is a 32yo G3P0020 at 49w5dwho presents with concern for ROM about 30 minutes prior to arrival. Her pregnancy is complicated by history of prior PPROM x 2, cerclage placement, Type 2 DM, depression, and obesity. She states she noticed small amount of fluid leaking and was not able to use her pelvic muscles to stop it. Has been having trouble controlling urine for a few weeks. She states she has been noticing more discharge for the past few weeks as well. Was told she had a yeast infection but medication was too risky for her to take. Does not have any itching, pain, discomfort. Has been wearing a pad for the past two weeks because of some leakage.   OB History    Gravida  3   Para  2   Term  0   Preterm  2   AB  0   Living  0     SAB  0   TAB  0   Ectopic  0   Multiple  1   Live Births  2           Past Medical History:  Diagnosis Date  . Anxiety    no current med.  . Cervix prolapsed into vagina   . Chronic headaches    otc med prn  . Constipation   . Depression    no meds currently  . Diabetes mellitus [place] complication [place]    type 2  . Gall stones    Resolved with surgery  . GERD (gastroesophageal reflux disease)   . HPV in female   . Jaw snapping    states jaw pops if opens mouth too wide  . Ovarian cyst   . PCOS (polycystic ovarian syndrome)   . Pilonidal cyst 02/2013  . Preterm labor 2016   PPROM @ 18 wks, delivery of IUFD @ 21 wks  . Seasonal allergies   . SVD (spontaneous vaginal delivery)    x 2 - both fetal demises 2nd trim.  . Vaginal Pap smear, abnormal     Past Surgical History:  Procedure Laterality Date  . CERVICAL CERCLAGE N/A 01/21/2018   Procedure: CERCLAGE CERVICAL;  Surgeon: LGuss Bunde MD;  Location[facility]  Service: Gynecology;  Laterality: N/A;  . CHOLECYSTECTOMY N/A 07/21/2016   Procedure: LAPAROSCOPIC CHOLECYSTECTOMY;  Surgeon: DCoralie Keens MD;  [facility]  Service: General;  Laterality: N/A;  . PILONIDAL CYST EXCISION N/A 03/12/2013   Procedure: CYST EXCISION PILONIDAL EXTENSIVE;  Surgeon: DHarl Bowie MD;  [facility]  Service: General;  Laterality: N/A;  . WISDOM TOOTH EXTRACTION      Family History  Problem Relation Age of Onset  . Hypertension Mother   . Hyperlipidemia Mother   . Diabetes Mother   . Thyroid disease Mother   . Diabetes Father   . Hypertension Father   . Alcohol abuse Father   . Breast cancer Maternal Aunt   . Lung cancer Paternal Grandfather     Social History   Tobacco Use  . Smoking status: Former Smoker    Packs/day: 0.25    Years: 12.00    Pack years: 3.00    Types: Cigarettes    Last attempt to quit: 01/10/2018  Years since quitting: 0.3  . Smokeless tobacco: Never Used  Substance Use Topics  . Alcohol use: No    Frequency: Never    Comment: socially but none with pregnancy  . Drug use: Not Currently    Types: Hydrocodone    Comment: prescribed this medication    Allergies:  Allergies  Allergen Reactions  . Lactose Intolerance (Gi) Diarrhea    Facility-Administered Medications Prior to Admission  Medication Dose Route Frequency Provider Last Rate Last Dose  . HYDROXYprogesterone Caproate SOAJ 275 mg  275 mg Subcutaneous Weekly Guss Bunde, MD   275 mg at 04/07/18 1329   Medications Prior to Admission  Medication Sig Dispense Refill Last Dose  . Cholecalciferol (VITAMIN D3) 2000 units TABS Take 2,000 Units by mouth daily.   05/16/2018 at Unknown time  . metFORMIN (GLUCOPHAGE) 500 MG tablet Take 1 tablet (500 mg total) by mouth 2 (two) times daily with a meal. 60 tablet 1 Past Week at Unknown time  . Prenatal Multivit-Min-Fe-FA (PRENATAL VITAMINS PO) Take 1 tablet by mouth  daily.    Past Week at Unknown time  . ACCU-CHEK FASTCLIX LANCETS MISC 1 Units by Percutaneous route 4 (four) times daily. (Patient not taking: Reported on 05/06/2018) 100 each 12 Not Taking  . Blood Glucose Monitoring Suppl (ACCU-CHEK NANO SMARTVIEW) w/Device KIT 1 kit by Subdermal route as directed. Check blood sugars for fasting, and two hours after breakfast, lunch and dinner (4 checks daily) (Patient not taking: Reported on 05/06/2018) 1 kit 0 Not Taking  . docusate sodium (COLACE) 100 MG capsule Take 1 capsule (100 mg total) by mouth every 12 (twelve) hours. (Patient not taking: Reported on 05/06/2018) 60 capsule 0 Not Taking  . Elastic Bandages & Supports (COMFORT FIT MATERNITY SUPP LG) MISC 1 Units by Does not apply route daily. (Patient not taking: Reported on 05/06/2018) 1 each 0 Not Taking  . esomeprazole (NEXIUM) 20 MG capsule Take one tablet daily in the morning. 30 capsule 4 Taking  . famotidine (PEPCID) 20 MG tablet Take 1 tablet (20 mg total) by mouth 2 (two) times daily. (Patient not taking: Reported on 05/06/2018) 30 tablet 2 Not Taking  . glucose blood (ACCU-CHEK SMARTVIEW) test strip Use as instructed to check blood sugars (Patient not taking: Reported on 05/06/2018) 100 each 12 Not Taking  . ibuprofen (ADVIL,MOTRIN) 600 MG tablet Take 1 tablet (600 mg total) by mouth every 6 (six) hours as needed. (Patient not taking: Reported on 04/14/2018) 30 tablet 0 Not Taking  . insulin glargine (LANTUS) 100 UNIT/ML injection Inject 0.1 mLs (10 Units total) into the skin at bedtime. (Patient not taking: Reported on 04/07/2018) 10 mL 11 Not Taking  . methocarbamol (ROBAXIN) 750 MG tablet Take 1 tablet (750 mg total) by mouth 3 (three) times daily. (Patient not taking: Reported on 04/14/2018) 30 tablet 0 Not Taking  . ondansetron (ZOFRAN) 4 MG tablet Take 1 tablet (4 mg total) by mouth every 8 (eight) hours as needed for nausea or vomiting. (Patient not taking: Reported on 04/07/2018) 20 tablet 0  Not Taking  . polyethylene glycol (MIRALAX / GLYCOLAX) packet Take 17 g by mouth daily. (Patient not taking: Reported on 05/12/2018) 14 each 0 Not Taking  . ranitidine (ZANTAC) 150 MG tablet Take 1 tablet (150 mg total) by mouth 2 (two) times daily. (Patient not taking: Reported on 04/14/2018) 60 tablet 2 Not Taking    Review of Systems  Constitutional: Negative for activity change and appetite change.  Respiratory:  Negative for cough.   Genitourinary: Positive for frequency (has noticed leaking of urine) and vaginal discharge. Negative for dysuria, pelvic pain, vaginal bleeding and vaginal pain.  Musculoskeletal: Negative for back pain.  Neurological: Negative for headaches.   Physical Exam   Last menstrual period 10/27/2017.  Physical Exam  Nursing note and vitals reviewed. Constitutional: She is oriented to person, place, and time. She appears well-developed and well-nourished. No distress.  HENT:  Head: Normocephalic and atraumatic.  Eyes: Conjunctivae and EOM are normal. No scleral icterus.  Cardiovascular: Normal rate.  Respiratory: No respiratory distress.  GI: Soft. There is no abdominal tenderness.  gravid  Genitourinary:    Vaginal discharge present.     Genitourinary Comments: Normal appearing labia, normal appearing vaginal mucosa, small amount of clumpy white discharge in vault, no fluid noted with valsalva, cervix visually closed without appearance of bulging/tension on cerclage, SVE deferred   Musculoskeletal:        General: No edema.  Neurological: She is alert and oriented to person, place, and time.  Skin: Skin is warm and dry. No rash noted.  Psychiatric: She has a normal mood and affect. Her behavior is normal.   MAU Course  Procedures  MDM -- no ferning noted -- Amnisure negative - performed given strong history of PPROM though no evidence on physical exam -- Reactive NST: 150s/mod/10x10 accelerations/-decelerations, no contractions  Assessment and  Plan  32yo G3P0020 at 46w5dwith pregnancy complicated by cerclage placement for history of PPROM, Type 2 Diabetes, obesity who presents with concern for ROM. No evidence of ROM on exam and Amnisure fortunately negative. Reassuring, reactive tracing. Discharged home with return precautions. Has follow-up in clinic in 2 weeks. Reviewed preterm labor precautions with patient prior to discharge, she voiced understanding of the plan.   LGlenice Bow DO 05/16/2018, 9:41 PM

## 2018-05-16 NOTE — Discharge Instructions (Signed)
·   Return to MAU if you notice any increased leakage of fluids.  Keep appointments for weekly 17P.   Take medications as directed and check blood sugar regularly as instructed.

## 2018-05-16 NOTE — MAU Note (Signed)
Pt presents to MAU stating she felt a gush of fluid around 2050 while she was sitting on her mothers couch, pt states she is unsure if it was urine or her water breaking and she is just really concerned. Pt reports +FM. No bleeding or contractions.

## 2018-05-19 ENCOUNTER — Ambulatory Visit (INDEPENDENT_AMBULATORY_CARE_PROVIDER_SITE_OTHER): Payer: Medicaid Other | Admitting: Obstetrics & Gynecology

## 2018-05-19 VITALS — BP 115/64 | HR 104 | Wt 290.0 lb

## 2018-05-19 DIAGNOSIS — O24119 Pre-existing diabetes mellitus, type 2, in pregnancy, unspecified trimester: Secondary | ICD-10-CM | POA: Diagnosis not present

## 2018-05-19 DIAGNOSIS — O0990 Supervision of high risk pregnancy, unspecified, unspecified trimester: Secondary | ICD-10-CM

## 2018-05-19 DIAGNOSIS — O3433 Maternal care for cervical incompetence, third trimester: Secondary | ICD-10-CM

## 2018-05-19 DIAGNOSIS — R7989 Other specified abnormal findings of blood chemistry: Secondary | ICD-10-CM

## 2018-05-19 DIAGNOSIS — O99210 Obesity complicating pregnancy, unspecified trimester: Secondary | ICD-10-CM

## 2018-05-19 DIAGNOSIS — N883 Incompetence of cervix uteri: Secondary | ICD-10-CM

## 2018-05-19 MED ORDER — METFORMIN HCL 1000 MG PO TABS: 1000.0000 mg | ORAL_TABLET | Freq: Two times a day (BID) | ORAL | 3 refills | Status: DC

## 2018-05-19 NOTE — Progress Notes (Signed)
   PRENATAL VISIT NOTE  Subjective:  Traci Peterson is a 32 y.o. G3P0200 at 5041w1d being seen today for ongoing prenatal care.  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, yellow 6 and red food colorings - moderate to severe respiratory distress; Chronic headaches -  no meds; Family history of breast cancer in female; Gall stones; Incompetent cervix; MDD (major depressive disorder), recurrent episode, severe (HCC); Vitamin D deficiency; Type 2 diabetes mellitus (HCC); Pre-existing type 2 diabetes mellitus during pregnancy, antepartum; Supervision of high risk pregnancy, antepartum; Trichomonal vaginitis during pregnancy; Heartburn in pregnancy; and Gestational diabetes mellitus (GDM) in third trimester controlled on oral hypoglycemic drug on their problem list.  Patient reports no complaints.  Contractions: Irritability. Vag. Bleeding: None.  Movement: Present. Denies leaking of fluid.   The following portions of the patient's history were reviewed and updated as appropriate: allergies, current medications, past family history, past medical history, past social history, past surgical history and problem list. Problem list updated.  Objective:   Vitals:   05/19/18 1456  BP: 115/64  Pulse: (!) 104  Weight: 290 lb (131.5 kg)    Fetal Status: Fetal Heart Rate (bpm): 149   Movement: Present     General:  Alert, oriented and cooperative. Patient is in no acute distress.  Skin: Skin is warm and dry. No rash noted.   Cardiovascular: Normal heart rate noted  Respiratory: Normal respiratory effort, no problems with respiration noted  Abdomen: Soft, gravid, appropriate for gestational age.  Pain/Pressure: Absent     Pelvic: Cervical exam deferred        Extremities: Normal range of motion.  Edema: None  Mental Status: Normal mood and  affect. Normal behavior. Normal judgment and thought content.   Assessment and Plan:  Pregnancy: G3P0200 at 1941w1d  1. Pre-existing type 2 diabetes mellitus during pregnancy, antepartum - Encouraged better sugar monitoring and dieting -growth u/s with MFM in 4 weeks - Hemoglobin A1c - on metformin 500 mg BID, rec increase to 1000 mg BID - She hasn't checked her sugars today, didn't bring any,  She remembers checking it yesterday "evening" and says that it was 137  2. Obesity in pregnancy - Hemoglobin A1c  3. Supervision of high risk pregnancy, antepartum   4. Incompetent cervix - has cerclage - weekly 17 P  5. Low vitamin D level  - Vitamin D (25 hydroxy)  Preterm labor symptoms and general obstetric precautions including but not limited to vaginal bleeding, contractions, leaking of fluid and fetal movement were reviewed in detail with the patient. Please refer to After Visit Summary for other counseling recommendations.  No follow-ups on file.  Future Appointments  Date Time Provider Department Center  05/26/2018  4:00 PM [facility] NURSE [facility]    Allie BossierMyra C Espen Bethel, MD

## 2018-05-20 LAB — HEMOGLOBIN A1C
Hgb A1c MFr Bld: 6.6 % of total Hgb — ABNORMAL HIGH (ref <5.7)
Mean Plasma Glucose: 143 (calc)
eAG (mmol/L): 7.9 (calc)

## 2018-05-20 LAB — VITAMIN D 25 HYDROXY (VIT D DEFICIENCY, FRACTURES): Vit D, 25-Hydroxy: 13 ng/mL — ABNORMAL LOW (ref 30–100)

## 2018-05-22 ENCOUNTER — Other Ambulatory Visit: Payer: Self-pay | Admitting: Obstetrics & Gynecology

## 2018-05-22 MED ORDER — VITAMIN D (ERGOCALCIFEROL) 1.25 MG (50000 UNIT) PO CAPS: 50000.0000 [IU] | ORAL_CAPSULE | ORAL | 0 refills | Status: DC

## 2018-05-22 NOTE — Progress Notes (Signed)
Vitamin d prescribed for deficiency 

## 2018-05-26 ENCOUNTER — Ambulatory Visit (INDEPENDENT_AMBULATORY_CARE_PROVIDER_SITE_OTHER): Payer: Medicaid Other

## 2018-05-26 DIAGNOSIS — O3433 Maternal care for cervical incompetence, third trimester: Secondary | ICD-10-CM | POA: Diagnosis not present

## 2018-05-26 DIAGNOSIS — N883 Incompetence of cervix uteri: Secondary | ICD-10-CM

## 2018-05-26 NOTE — Progress Notes (Signed)
Pt here for Makena. Injection given in the left arm.

## 2018-05-27 ENCOUNTER — Ambulatory Visit (INDEPENDENT_AMBULATORY_CARE_PROVIDER_SITE_OTHER): Payer: Medicaid Other | Admitting: Certified Nurse Midwife

## 2018-05-27 ENCOUNTER — Ambulatory Visit: Payer: Self-pay | Admitting: *Deleted

## 2018-05-27 VITALS — BP 118/60 | HR 100 | Wt 293.0 lb

## 2018-05-27 DIAGNOSIS — O24113 Pre-existing diabetes mellitus, type 2, in pregnancy, third trimester: Secondary | ICD-10-CM | POA: Diagnosis not present

## 2018-05-27 DIAGNOSIS — O24119 Pre-existing diabetes mellitus, type 2, in pregnancy, unspecified trimester: Secondary | ICD-10-CM

## 2018-05-27 DIAGNOSIS — N883 Incompetence of cervix uteri: Secondary | ICD-10-CM

## 2018-05-27 DIAGNOSIS — O24419 Gestational diabetes mellitus in pregnancy, unspecified control: Secondary | ICD-10-CM

## 2018-05-27 DIAGNOSIS — O0993 Supervision of high risk pregnancy, unspecified, third trimester: Secondary | ICD-10-CM

## 2018-05-27 DIAGNOSIS — O0990 Supervision of high risk pregnancy, unspecified, unspecified trimester: Secondary | ICD-10-CM

## 2018-05-27 LAB — GLUCOSE, POCT (MANUAL RESULT ENTRY): POC Glucose: 161 mg/dl — AB (ref 70–99)

## 2018-05-27 MED ORDER — ASPIRIN 81 MG PO CHEW: 81.0000 mg | CHEWABLE_TABLET | Freq: Every day | ORAL | 0 refills | Status: DC

## 2018-05-27 NOTE — Progress Notes (Signed)
Subjective:  Traci Peterson is a 32 y.o. G3P0200 at 6658w2d being seen today for ongoing prenatal care.  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, yellow 6 and red food colorings - moderate to severe respiratory distress; Chronic headaches -  no meds; Family history of breast cancer in female; Gall stones; Incompetent cervix; MDD (major depressive disorder), recurrent episode, severe (HCC); Vitamin D deficiency; Type 2 diabetes mellitus (HCC); Pre-existing type 2 diabetes mellitus during pregnancy, antepartum; Supervision of high risk pregnancy, antepartum; Trichomonal vaginitis during pregnancy; Heartburn in pregnancy; and Gestational diabetes mellitus (GDM) in third trimester controlled on oral hypoglycemic drug on their problem list.  Patient reports no complaints.  Contractions: Irritability. Vag. Bleeding: None.  Movement: Present. Denies leaking of fluid.   The following portions of the patient's history were reviewed and updated as appropriate: allergies, current medications, past family history, past medical history, past social history, past surgical history and problem list. Problem list updated.  Objective:   Vitals:   05/27/18 1008  BP: 118/60  Pulse: 100  Weight: 132.9 kg    Fetal Status: Fetal Heart Rate (bpm): 148 Fundal Height: 35 cm Movement: Present  Presentation: Undeterminable  General:  Alert, oriented and cooperative. Patient is in no acute distress.  Skin: Skin is warm and dry. No rash noted.   Cardiovascular: Normal heart rate noted  Respiratory: Normal respiratory effort, no problems with respiration noted  Abdomen: Soft, gravid, appropriate for gestational age. Pain/Pressure: Present     Pelvic: Vag. Bleeding: None Vag D/C Character: Thick   Cervical exam deferred        Extremities: Normal range of  motion.  Edema: Trace  Mental Status: Normal mood and affect. Normal behavior. Normal judgment and thought content.   Urinalysis:      Assessment and Plan:  Pregnancy: G3P0200 at 6058w2d  1. Supervision of high risk pregnancy, antepartum  2. Pre-existing type 2 diabetes mellitus during pregnancy, antepartum - Noncompliant, not checking BS at home, did not bring log - has not picked up Insulin because she's not eating right - POCT Glucose (CBG) > 161 today 1 hr after eating, had [place] food and peach tea - discussed in depth poor outcomes associated with poorly controlled DM  including IUFD or newborn to NICU after delivery as  - adhere to diabetic diet, NO sugary drinks, can use sweetener, lower carb intake, increase protein intake - needs to start Insulin, continue Metformin, and check BS fasting, 2 hr pp, and hs every day, bring log to EVERY visit, spouse present today and will assist with this - US MFM FETAL BPP WO NON STRESS; Future >wkly at 32 wks - US MFM Follow up US in 2 wks - start bASA  3. Incompetent cervix - cerclage in place - continue 17-P  4. S>D - FH not reliable d/t panus  Preterm labor symptoms and general obstetric precautions including but not limited to vaginal bleeding, contractions, leaking of fluid and fetal movement were reviewed in detail with the patient. Please refer to After Visit Summary for other counseling recommendations.  Return in about 2 weeks (around 06/10/2018).   Donette LarryBhambri, Sera Hitsman, CNM

## 2018-05-27 NOTE — Progress Notes (Signed)
CBG 161 1 hr PP

## 2018-05-29 ENCOUNTER — Encounter (HOSPITAL_COMMUNITY): Payer: Self-pay

## 2018-05-29 ENCOUNTER — Inpatient Hospital Stay (HOSPITAL_COMMUNITY)
Admission: AD | Admit: 2018-05-29 | Discharge: 2018-05-29 | Disposition: A | Discharge status: Home / Self Care | Payer: Medicaid Other | Attending: Obstetrics & Gynecology | Admitting: Obstetrics & Gynecology

## 2018-05-29 DIAGNOSIS — Z3A30 30 weeks gestation of pregnancy: Secondary | ICD-10-CM | POA: Diagnosis not present

## 2018-05-29 DIAGNOSIS — R109 Unspecified abdominal pain: Secondary | ICD-10-CM | POA: Diagnosis present

## 2018-05-29 DIAGNOSIS — R8290 Unspecified abnormal findings in urine: Secondary | ICD-10-CM

## 2018-05-29 DIAGNOSIS — O23599 Infection of other part of genital tract in pregnancy, unspecified trimester: Secondary | ICD-10-CM

## 2018-05-29 DIAGNOSIS — Z87891 Personal history of nicotine dependence: Secondary | ICD-10-CM | POA: Insufficient documentation

## 2018-05-29 DIAGNOSIS — O26893 Other specified pregnancy related conditions, third trimester: Secondary | ICD-10-CM | POA: Diagnosis not present

## 2018-05-29 DIAGNOSIS — A5901 Trichomonal vulvovaginitis: Secondary | ICD-10-CM

## 2018-05-29 DIAGNOSIS — R102 Pelvic and perineal pain: Secondary | ICD-10-CM | POA: Diagnosis not present

## 2018-05-29 DIAGNOSIS — O2343 Unspecified infection of urinary tract in pregnancy, third trimester: Secondary | ICD-10-CM | POA: Diagnosis not present

## 2018-05-29 LAB — URINALYSIS, ROUTINE W REFLEX MICROSCOPIC
Bilirubin Urine: NEGATIVE
Glucose, UA: 50 mg/dL — AB
Hgb urine dipstick: NEGATIVE
Ketones, ur: 5 mg/dL — AB
Nitrite: NEGATIVE
Protein, ur: NEGATIVE mg/dL
Specific Gravity, Urine: 1.028 (ref 1.005–1.030)
pH: 6 (ref 5.0–8.0)

## 2018-05-29 MED ORDER — CEPHALEXIN 500 MG PO CAPS: 500.0000 mg | ORAL_CAPSULE | Freq: Two times a day (BID) | ORAL | 0 refills | Status: AC

## 2018-05-29 MED ORDER — CEPHALEXIN 500 MG PO CAPS
500.0000 mg | ORAL_CAPSULE | Freq: Once | ORAL | Status: AC
  Administered 2018-05-29: 500 mg via ORAL
  Filled 2018-05-29: qty 1

## 2018-05-29 NOTE — MAU Note (Addendum)
Pt c/o of abdominal pressure which started around 1630 today, which changes with movement.  Rating 7-8 when moving or changing position.   Is concerned d/t previous preterm deliveries. Has cerclage in place and takes 17P. No cervical change at last PNV.  Patient denies bleeding or LOF. +FM

## 2018-05-29 NOTE — MAU Provider Note (Signed)
History     CSN: [id number]  Arrival date and time: 05/29/18 2016   First Provider Initiated Contact with Patient 05/29/18 2105      Chief Complaint  Patient presents with  . Abdominal Pain   [name] presents for lower abdominal/pelvic pain She states that pain started to get really bad earlier today It is exacerbated by movement She's also been incontinent to urine all day  She's also had pain with urination Denies contractions, leakage of fluid, vaginal bleeding or decreased fetal movement Reports that she's had a yeast infection for the majority of her pregnancy, but has not been treated, since she's been asymptomatic Denies vaginal itching, burning or pain   Past Medical History:  Diagnosis Date  . Anxiety    no current med.  . Cervix prolapsed into vagina   . Chronic headaches    otc med prn  . Constipation   . Depression    no meds currently  . Diabetes mellitus without complication ([place])    type 2  . Gall stones    Resolved with surgery  . GERD (gastroesophageal reflux disease)   . HPV in female   . Jaw snapping    states jaw pops if opens mouth too wide  . Ovarian cyst   . PCOS (polycystic ovarian syndrome)   . Pilonidal cyst 02/2013  . Preterm labor 2016   PPROM @ 18 wks, delivery of IUFD @ 21 wks  . Seasonal allergies   . SVD (spontaneous vaginal delivery)    x 2 - both fetal demises 2nd trim.  . Vaginal Pap smear, abnormal     Past Surgical History:  Procedure Laterality Date  . CERVICAL CERCLAGE N/A 01/21/2018   Procedure: CERCLAGE CERVICAL;  Surgeon: Guss Bunde, MD;  Location: [facility];  Service: Gynecology;  Laterality: N/A;  . CHOLECYSTECTOMY N/A 07/21/2016   Procedure: LAPAROSCOPIC CHOLECYSTECTOMY;  Surgeon: Coralie Keens, MD;  Location: [facility];  Service: General;  Laterality: N/A;  . PILONIDAL CYST EXCISION N/A 03/12/2013   Procedure: CYST EXCISION PILONIDAL EXTENSIVE;  Surgeon: Harl Bowie, MD;  Location: [facility];  Service: General;  Laterality: N/A;  . WISDOM TOOTH EXTRACTION      Family History  Problem Relation Age of Onset  . Hypertension Mother   . Hyperlipidemia Mother   . Diabetes Mother   . Thyroid disease Mother   . Diabetes Father   . Hypertension Father   . Alcohol abuse Father   . Breast cancer Maternal Aunt   . Lung cancer Paternal Grandfather     Social History   Tobacco Use  . Smoking status: Former Smoker    Packs/day: 0.25    Years: 12.00    Pack years: 3.00    Types: Cigarettes    Last attempt to quit: 01/10/2018    Years since quitting: 0.3  . Smokeless tobacco: Never Used  Substance Use Topics  . Alcohol use: No    Frequency: Never    Comment: socially but none with pregnancy  . Drug use: Not Currently    Types: Hydrocodone    Comment: prescribed this medication    Allergies:  Allergies  Allergen Reactions  . Lactose Intolerance (Gi) Diarrhea    Facility-Administered Medications Prior to Admission  Medication Dose Route Frequency Provider Last Rate Last Dose  . HYDROXYprogesterone Caproate SOAJ 275 mg  275 mg Subcutaneous Weekly Guss Bunde, MD   275 mg at 05/26/18 1637   Medications  Prior to Admission  Medication Sig Dispense Refill Last Dose  . ACCU-CHEK FASTCLIX LANCETS MISC 1 Units by Percutaneous route 4 (four) times daily. 100 each 12 Past Week at Unknown time  . aspirin 81 MG chewable tablet Chew 1 tablet (81 mg total) by mouth daily. 60 tablet 0 Past Week at Unknown time  . Blood Glucose Monitoring Suppl (ACCU-CHEK NANO SMARTVIEW) w/Device KIT 1 kit by Subdermal route as directed. Check blood sugars for fasting, and two hours after breakfast, lunch and dinner (4 checks daily) 1 kit 0 Past Week at Unknown time  . Cholecalciferol (VITAMIN D3) 2000 units TABS Take 2,000 Units by mouth daily.   05/28/2018 at Unknown time  . esomeprazole (NEXIUM) 20 MG capsule Take one tablet daily in the morning. 30 capsule 4 05/28/2018 at  Unknown time  . glucose blood (ACCU-CHEK SMARTVIEW) test strip Use as instructed to check blood sugars 100 each 12 Past Week at Unknown time  . metFORMIN (GLUCOPHAGE) 1000 MG tablet Take 1 tablet (1,000 mg total) by mouth 2 (two) times daily with a meal. 60 tablet 3 05/28/2018 at Unknown time  . Prenatal Multivit-Min-Fe-FA (PRENATAL VITAMINS PO) Take 1 tablet by mouth daily.    05/28/2018 at Unknown time  . Vitamin D, Ergocalciferol, (DRISDOL) 1.25 MG (50000 UT) CAPS capsule Take 1 capsule (50,000 Units total) by mouth every 7 (seven) days. 12 capsule 0 05/28/2018 at Unknown time  . insulin glargine (LANTUS) 100 UNIT/ML injection Inject 0.1 mLs (10 Units total) into the skin at bedtime. 10 mL 11 not taking    Review of Systems  All other systems reviewed and are negative.  Physical Exam   Blood pressure (!) 111/54, pulse (!) 102, temperature 98.4 F (36.9 C), temperature source Oral, height 5' 2"  (1.575 m), weight 134.6 kg, last menstrual period 10/27/2017, SpO2 100 %.  Physical Exam  Constitutional: She is oriented to person, place, and time. She appears well-developed and well-nourished. She appears distressed (pain with movement ).  HENT:  Head: Normocephalic and atraumatic.  Eyes: Pupils are equal, round, and reactive to light. Conjunctivae are normal. Right eye exhibits no discharge. Left eye exhibits no discharge. No scleral icterus.  Cardiovascular: Normal rate.  Respiratory: Effort normal. No respiratory distress.  GI: Soft. There is abdominal tenderness (lower abdomen ).  Gravid. Large panus.   Genitourinary:    Vaginal discharge (cottage cheese like white discharge in vagina. No blood or fluid.) present.   Neurological: She is alert and oriented to person, place, and time.  Skin: She is not diaphoretic.  Psychiatric: She has a normal mood and affect. Her behavior is normal. Judgment and thought content normal.  Strip: baseline 150/mod variability/+ acels/no decels  MAU Course   Procedures  MDM Pt with new onset pelvic pain, urinary incontinence and large leuks on UA  Assessment and Plan  Pelvic pain affecting pregnancy in third trimester, antepartum Abnormal urine finding - will treat pt for UTI given new leukocytes on UA, incontinence and pain - UCx sent; will adjust treatment as needed based on results - encourage lots of hydration - return precautions given - follow up at next scheduled visit or PRN   Aura Camps 05/29/2018, 9:25 PM

## 2018-05-30 ENCOUNTER — Other Ambulatory Visit: Payer: Self-pay | Admitting: *Deleted

## 2018-05-30 MED ORDER — ESOMEPRAZOLE MAGNESIUM 20 MG PO CPDR: DELAYED_RELEASE_CAPSULE | ORAL | 4 refills | Status: DC

## 2018-05-30 MED ORDER — METFORMIN HCL 1000 MG PO TABS: 1000.0000 mg | ORAL_TABLET | Freq: Two times a day (BID) | ORAL | 3 refills | Status: DC

## 2018-05-31 LAB — URINE CULTURE: Special Requests: NORMAL

## 2018-06-02 ENCOUNTER — Other Ambulatory Visit: Payer: Self-pay | Admitting: *Deleted

## 2018-06-02 ENCOUNTER — Ambulatory Visit (INDEPENDENT_AMBULATORY_CARE_PROVIDER_SITE_OTHER): Payer: Medicaid Other | Admitting: *Deleted

## 2018-06-02 DIAGNOSIS — N883 Incompetence of cervix uteri: Secondary | ICD-10-CM

## 2018-06-02 DIAGNOSIS — O3433 Maternal care for cervical incompetence, third trimester: Secondary | ICD-10-CM

## 2018-06-02 MED ORDER — INSULIN GLARGINE 100 UNIT/ML ~~LOC~~ SOLN: 10.0000 [IU] | Freq: Every day | SUBCUTANEOUS | 11 refills | Status: DC

## 2018-06-02 NOTE — Progress Notes (Signed)
Pt here for Makena injection given Left arm.

## 2018-06-09 ENCOUNTER — Ambulatory Visit (INDEPENDENT_AMBULATORY_CARE_PROVIDER_SITE_OTHER): Payer: Medicaid Other | Admitting: Obstetrics & Gynecology

## 2018-06-09 VITALS — BP 107/64 | HR 92 | Wt 291.0 lb

## 2018-06-09 DIAGNOSIS — O99210 Obesity complicating pregnancy, unspecified trimester: Secondary | ICD-10-CM

## 2018-06-09 DIAGNOSIS — O24415 Gestational diabetes mellitus in pregnancy, controlled by oral hypoglycemic drugs: Secondary | ICD-10-CM | POA: Diagnosis not present

## 2018-06-09 DIAGNOSIS — N883 Incompetence of cervix uteri: Secondary | ICD-10-CM

## 2018-06-09 DIAGNOSIS — O0993 Supervision of high risk pregnancy, unspecified, third trimester: Secondary | ICD-10-CM | POA: Diagnosis not present

## 2018-06-09 DIAGNOSIS — O3433 Maternal care for cervical incompetence, third trimester: Secondary | ICD-10-CM | POA: Diagnosis not present

## 2018-06-09 DIAGNOSIS — O0990 Supervision of high risk pregnancy, unspecified, unspecified trimester: Secondary | ICD-10-CM

## 2018-06-09 DIAGNOSIS — Z3A32 32 weeks gestation of pregnancy: Secondary | ICD-10-CM

## 2018-06-09 DIAGNOSIS — O99213 Obesity complicating pregnancy, third trimester: Secondary | ICD-10-CM

## 2018-06-09 DIAGNOSIS — E559 Vitamin D deficiency, unspecified: Secondary | ICD-10-CM

## 2018-06-09 NOTE — Progress Notes (Signed)
   PRENATAL VISIT NOTE  Subjective:  Traci Peterson is a 33 y.o. G3P0200 at [redacted]w[redacted]d being seen today for ongoing prenatal care.  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, yellow 6 and red food colorings - moderate to severe respiratory distress; Chronic headaches -  no meds; Family history of breast cancer in female; Gall stones; Incompetent cervix; MDD (major depressive disorder), recurrent episode, severe (HCC); Vitamin D deficiency; Type 2 diabetes mellitus (HCC); Pre-existing type 2 diabetes mellitus during pregnancy, antepartum; Supervision of high risk pregnancy, antepartum; Trichomonal vaginitis during pregnancy; Heartburn in pregnancy; and Gestational diabetes mellitus (GDM) in third trimester controlled on oral hypoglycemic drug on their problem list.  Patient reports no complaints.  Contractions: Irritability. Vag. Bleeding: None.  Movement: Present. Denies leaking of fluid.   The following portions of the patient's history were reviewed and updated as appropriate: allergies, current medications, past family history, past medical history, past social history, past surgical history and problem list. Problem list updated.  Objective:   Vitals:   06/09/18 1412  BP: 107/64  Pulse: 92  Weight: 291 lb (132 kg)    Fetal Status: Fetal Heart Rate (bpm): 147   Movement: Present     General:  Alert, oriented and cooperative. Patient is in no acute distress.  Skin: Skin is warm and dry. No rash noted.   Cardiovascular: Normal heart rate noted  Respiratory: Normal respiratory effort, no problems with respiration noted  Abdomen: Soft, gravid, appropriate for gestational age.  Pain/Pressure: Present     Pelvic: Cervical exam deferred        Extremities: Normal range of motion.  Edema: Trace  Mental Status: Normal mood and  affect. Normal behavior. Normal judgment and thought content.   Assessment and Plan:  Pregnancy: G3P0200 at [redacted]w[redacted]d  1. Supervision of high risk pregnancy, antepartum -MFM u/s q 4 weeks  2. Vitamin D deficiency   3. Obesity in pregnancy   4. Incompetent cervix - cerclage - 17 P  5. Gestational diabetes mellitus (GDM) in third trimester controlled on oral hypoglycemic drug - she has not taken her metformin due to nausea in the last 3 days. - she has brought fasting sugars in the 120s-130s, no other values - I will increase her lantus from 10 to 14 qhs and she promises to bring me some 2 hour PCs -weekly NST here and weekly BPP at MFM  Preterm labor symptoms and general obstetric precautions including but not limited to vaginal bleeding, contractions, leaking of fluid and fetal movement were reviewed in detail with the patient. Please refer to After Visit Summary for other counseling recommendations.  No follow-ups on file.  Future Appointments  Date Time Provider Department Center  06/10/2018  9:45 AM [facility] [place] 2 [facility]  06/16/2018  4:15 PM [facility] NURSE [facility]  06/23/2018  2:30 PM Allie Bossier, MD [facility]    Allie Bossier, MD

## 2018-06-10 ENCOUNTER — Encounter (HOSPITAL_COMMUNITY): Payer: Self-pay

## 2018-06-10 ENCOUNTER — Other Ambulatory Visit (HOSPITAL_COMMUNITY): Payer: Self-pay | Admitting: *Deleted

## 2018-06-10 ENCOUNTER — Encounter: Payer: Self-pay | Admitting: Advanced Practice Midwife

## 2018-06-10 ENCOUNTER — Ambulatory Visit (HOSPITAL_COMMUNITY)
Admission: RE | Admit: 2018-06-10 | Discharge: 2018-06-10 | Disposition: A | Discharge status: Home / Self Care | Payer: Medicaid Other | Source: Ambulatory Visit | Attending: Certified Nurse Midwife | Admitting: Certified Nurse Midwife

## 2018-06-10 DIAGNOSIS — E1100 Type 2 diabetes mellitus with hyperosmolarity without nonketotic hyperglycemic-hyperosmolar coma (NKHHC): Secondary | ICD-10-CM | POA: Diagnosis present

## 2018-06-10 DIAGNOSIS — O09213 Supervision of pregnancy with history of pre-term labor, third trimester: Secondary | ICD-10-CM

## 2018-06-10 DIAGNOSIS — O3433 Maternal care for cervical incompetence, third trimester: Secondary | ICD-10-CM | POA: Diagnosis not present

## 2018-06-10 DIAGNOSIS — O24415 Gestational diabetes mellitus in pregnancy, controlled by oral hypoglycemic drugs: Secondary | ICD-10-CM | POA: Diagnosis present

## 2018-06-10 DIAGNOSIS — O24113 Pre-existing diabetes mellitus, type 2, in pregnancy, third trimester: Secondary | ICD-10-CM

## 2018-06-10 DIAGNOSIS — Z362 Encounter for other antenatal screening follow-up: Secondary | ICD-10-CM | POA: Insufficient documentation

## 2018-06-10 DIAGNOSIS — Z3A32 32 weeks gestation of pregnancy: Secondary | ICD-10-CM

## 2018-06-10 DIAGNOSIS — O24119 Pre-existing diabetes mellitus, type 2, in pregnancy, unspecified trimester: Secondary | ICD-10-CM | POA: Diagnosis present

## 2018-06-10 DIAGNOSIS — O24313 Unspecified pre-existing diabetes mellitus in pregnancy, third trimester: Principal | ICD-10-CM

## 2018-06-10 DIAGNOSIS — O99213 Obesity complicating pregnancy, third trimester: Secondary | ICD-10-CM | POA: Diagnosis not present

## 2018-06-10 DIAGNOSIS — Z794 Long term (current) use of insulin: Principal | ICD-10-CM

## 2018-06-10 DIAGNOSIS — IMO0001 Reserved for inherently not codable concepts without codable children: Secondary | ICD-10-CM

## 2018-06-12 ENCOUNTER — Other Ambulatory Visit: Payer: Self-pay

## 2018-06-12 ENCOUNTER — Emergency Department (HOSPITAL_BASED_OUTPATIENT_CLINIC_OR_DEPARTMENT_OTHER)
Admission: EM | Admit: 2018-06-12 | Discharge: 2018-06-12 | Disposition: A | Discharge status: Home / Self Care | Payer: Medicaid Other | Attending: Emergency Medicine | Admitting: Emergency Medicine

## 2018-06-12 ENCOUNTER — Encounter (HOSPITAL_BASED_OUTPATIENT_CLINIC_OR_DEPARTMENT_OTHER): Payer: Self-pay | Admitting: Emergency Medicine

## 2018-06-12 DIAGNOSIS — Z7982 Long term (current) use of aspirin: Secondary | ICD-10-CM | POA: Insufficient documentation

## 2018-06-12 DIAGNOSIS — Z87891 Personal history of nicotine dependence: Secondary | ICD-10-CM | POA: Insufficient documentation

## 2018-06-12 DIAGNOSIS — Z79899 Other long term (current) drug therapy: Secondary | ICD-10-CM | POA: Insufficient documentation

## 2018-06-12 DIAGNOSIS — Z794 Long term (current) use of insulin: Secondary | ICD-10-CM | POA: Insufficient documentation

## 2018-06-12 DIAGNOSIS — E119 Type 2 diabetes mellitus without complications: Secondary | ICD-10-CM | POA: Insufficient documentation

## 2018-06-12 DIAGNOSIS — H60501 Unspecified acute noninfective otitis externa, right ear: Secondary | ICD-10-CM | POA: Diagnosis not present

## 2018-06-12 DIAGNOSIS — H9201 Otalgia, right ear: Secondary | ICD-10-CM | POA: Diagnosis present

## 2018-06-12 MED ORDER — AMOXICILLIN 500 MG PO CAPS: 500.0000 mg | ORAL_CAPSULE | Freq: Two times a day (BID) | ORAL | 0 refills | Status: DC

## 2018-06-12 NOTE — ED Provider Notes (Signed)
[facility] EMERGENCY DEPARTMENT Provider Note   CSN: [id number] Arrival date & time: 06/12/18  8270     History   Chief Complaint Chief Complaint  Patient presents with  . Otalgia    HPI Traci Peterson is a 33 y.o. female is currently [redacted] weeks pregnant who presents for evaluation of right ear pain that began last night.  Patient states that she feels like "stuff is popping in the ear."  States that the pain is worse when she chews or swallows.  Patient states that she feels like there is drainage in there but she has not noticed any discharge from the ear.  Patient does report that she uses Q-tips to clean the ears.  He states she has not noted any missing cotton swab.  Patient reports that she has had some congestion over the last few weeks.  She states she has not noted any fever.  She has not been taking any medication for the pain.`  The history is provided by the patient.    Past Medical History:  Diagnosis Date  . Anxiety    no current med.  . Cervix prolapsed into vagina   . Chronic headaches    otc med prn  . Constipation   . Depression    no meds currently  . Diabetes mellitus without complication ([place])    type 2  . Gall stones    Resolved with surgery  . GERD (gastroesophageal reflux disease)   . HPV in female   . Jaw snapping    states jaw pops if opens mouth too wide  . Ovarian cyst   . PCOS (polycystic ovarian syndrome)   . Pilonidal cyst 02/2013  . Preterm labor 2016   PPROM @ 18 wks, delivery of IUFD @ 21 wks  . Seasonal allergies   . SVD (spontaneous vaginal delivery)    x 2 - both fetal demises 2nd trim.  . Vaginal Pap smear, abnormal     Patient Active Problem List   Diagnosis Date Noted  . Gestational diabetes mellitus (GDM) in third trimester controlled on oral hypoglycemic drug 05/08/2018  . Heartburn in pregnancy 02/13/2018  . Trichomonal vaginitis during pregnancy 01/20/2018  . Supervision of high risk pregnancy, antepartum  01/01/2018  . Pre-existing type 2 diabetes mellitus during pregnancy, antepartum 12/16/2017  . Type 2 diabetes mellitus ([place]) 12/15/2017  . Vitamin D deficiency 12/10/2017  . MDD (major depressive disorder), recurrent episode, severe ([place]) 05/29/2017  . Incompetent cervix 09/12/2016  . Gall stones 07/19/2016  . Family history of breast cancer in female 06/06/2016  . Obesity in pregnancy 10/25/2014  . Eczema 10/25/2014  . 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 Chronic headaches -  no meds 10/25/2014  . Anxiety and depression 11/30/2013  . PCOS (polycystic ovarian syndrome) 04/20/2013    Past Surgical History:  Procedure Laterality Date  . CERVICAL CERCLAGE N/A 01/21/2018   Procedure: CERCLAGE CERVICAL;  Surgeon: Guss Bunde, MD;  Location: [facility];  Service: Gynecology;  Laterality: N/A;  . CHOLECYSTECTOMY N/A 07/21/2016   Procedure: LAPAROSCOPIC CHOLECYSTECTOMY;  Surgeon: Coralie Keens, MD;  Location: [facility];  Service: General;  Laterality: N/A;  . PILONIDAL CYST EXCISION N/A 03/12/2013   Procedure: CYST EXCISION PILONIDAL EXTENSIVE;  Surgeon: Harl Bowie, MD;  Location: [facility];  Service: General;  Laterality: N/A;  . WISDOM TOOTH EXTRACTION  OB History    Gravida  3   Para  2   Term  0   Preterm  2   AB  0   Living  0     SAB  0   TAB  0   Ectopic  0   Multiple  1   Live Births  2            Home Medications    Prior to Admission medications   Medication Sig Start Date End Date Taking? Authorizing Provider  ACCU-CHEK FASTCLIX LANCETS MISC 1 Units by Percutaneous route 4 (four) times daily. 11/30/17   Jorje Guild, NP  amoxicillin (AMOXIL) 500 MG capsule Take 1 capsule (500 mg total) by mouth 2 (two) times daily. 06/12/18   Volanda Napoleon, PA-C  aspirin 81 MG chewable tablet Chew 1 tablet (81 mg total) by mouth daily. 05/27/18    Julianne Handler, CNM  Blood Glucose Monitoring Suppl (ACCU-CHEK NANO SMARTVIEW) w/Device KIT 1 kit by Subdermal route as directed. Check blood sugars for fasting, and two hours after breakfast, lunch and dinner (4 checks daily) 11/30/17   Jorje Guild, NP  Cholecalciferol (VITAMIN D3) 2000 units TABS Take 2,000 Units by mouth daily.    [provider]  esomeprazole (NEXIUM) 20 MG capsule Take one tablet daily in the morning. 05/30/18   Emily Filbert, MD  glucose blood (ACCU-CHEK SMARTVIEW) test strip Use as instructed to check blood sugars 11/30/17   Jorje Guild, NP  insulin glargine (LANTUS) 100 UNIT/ML injection Inject 0.1 mLs (10 Units total) into the skin at bedtime. 06/02/18   Emily Filbert, MD  metFORMIN (GLUCOPHAGE) 1000 MG tablet Take 1 tablet (1,000 mg total) by mouth 2 (two) times daily with a meal. Patient not taking: Reported on 06/10/2018 05/30/18   Emily Filbert, MD  Prenatal Multivit-Min-Fe-FA (PRENATAL VITAMINS PO) Take 1 tablet by mouth daily.     [provider]  Vitamin D, Ergocalciferol, (DRISDOL) 1.25 MG (50000 UT) CAPS capsule Take 1 capsule (50,000 Units total) by mouth every 7 (seven) days. 05/22/18   Emily Filbert, MD    Family History Family History  Problem Relation Age of Onset  . Hypertension Mother   . Hyperlipidemia Mother   . Diabetes Mother   . Thyroid disease Mother   . Diabetes Father   . Hypertension Father   . Alcohol abuse Father   . Breast cancer Maternal Aunt   . Lung cancer Paternal Grandfather     Social History Social History   Tobacco Use  . Smoking status: Former Smoker    Packs/day: 0.25    Years: 12.00    Pack years: 3.00    Types: Cigarettes    Last attempt to quit: 01/10/2018    Years since quitting: 0.4  . Smokeless tobacco: Never Used  Substance Use Topics  . Alcohol use: No    Frequency: Never    Comment: socially but none with pregnancy  . Drug use: Not Currently    Types: Hydrocodone    Comment: prescribed this  medication     Allergies   Lactose intolerance (gi)   Review of Systems Review of Systems  Constitutional: Negative for fever.  HENT: Positive for ear pain. Negative for ear discharge and hearing loss.   All other systems reviewed and are negative.    Physical Exam Updated Vital Signs BP 116/63   Pulse (!) 101   Temp 97.9 F (36.6 C) (Oral)  Resp 20   Ht _0  (1.575 m)   Wt 132 kg   LMP 10/27/2017 (Exact Date)   SpO2 100%   BMI 53.22 kg/m   Physical Exam Vitals signs and nursing note reviewed.  Constitutional:      Appearance: She is well-developed.  HENT:     Head: Normocephalic and atraumatic.     Right Ear: No drainage. No middle ear effusion. No mastoid tenderness. Tympanic membrane is erythematous. Tympanic membrane is not bulging.     Left Ear: Tympanic membrane normal. No mastoid tenderness.     Ears:      Comments: Left TM is without any acute abnormalities.  Right external auditory canal is erythematous and slightly edematous.  Pain with movement of tragus and earlobe.  Right TM appears intact without any signs of perforation. TM slightly erythematous but no bulging, evidence of effusion.  She has a small white spot noted towards the 2:00 region.  Appears to be scarring.  No warmth, erythema, edema noted to mastoid process bilaterally.    Nose: Congestion present.     Comments: Edematous and erythematous nasal turbinates bilaterally.    Mouth/Throat:     Pharynx: Oropharynx is clear.     Comments: Posterior oropharynx is clear without any signs of erythema, edema. Uvula is midline.  No trismus. Airway is patent, phonation is intact. Eyes:     General: No scleral icterus.       Right eye: No discharge.        Left eye: No discharge.     Conjunctiva/sclera: Conjunctivae normal.  Pulmonary:     Effort: Pulmonary effort is normal.  Abdominal:     Comments: Gravid abdomen  Skin:    General: Skin is warm and dry.     Comments: No rash noted to face    Neurological:     Mental Status: She is alert.  Psychiatric:        Speech: Speech normal.        Behavior: Behavior normal.      ED Treatments / Results  Labs (all labs ordered are listed, but only abnormal results are displayed) Labs Reviewed - No data to display  EKG None  Radiology No results found.  Procedures Procedures (including critical care time)  Medications Ordered in ED Medications - No data to display   Initial Impression / Assessment and Plan / ED Course  I have reviewed the triage vital signs and the nursing notes.  Pertinent labs & imaging results that were available during my care of the patient were reviewed by me and considered in my medical decision making (see chart for details).     33 year old female who presents for evaluation of right ear pain that began last night.  History of congestion over last few weeks.  No fevers, drainage.  She feels that the ear is popping and is worse with chewing and swallowing. Patient is afebrile, non-toxic appearing, sitting comfortably on examination table. Vital signs reviewed and stable.  On exam, right TM appears slightly erythematous.  TM without any signs of perforation.  Pain with movement of tragus and earlobe.  External auditory canal appears slightly edematous and erythematous.  Question acute otitis externa vs AOM.  Patient's pregnancy, we will not do any fluoroquinolone eardrops, trimethoprim eardrops.  Will give amoxicillin.  Patient with no known drug allergies.  History/physical exam not concerning for shingles, mastoiditis. At this time, patient exhibits no emergent life-threatening condition that require further evaluation in ED  or admission. Patient had ample opportunity for questions and discussion. All patient's questions were answered with full understanding. Strict return precautions discussed. Patient expresses understanding and agreement to plan.    Portions of this note were generated with Geographical information systems officer. Dictation errors may occur despite best attempts at proofreading.  Final Clinical Impressions(s) / ED Diagnoses   Final diagnoses:  Acute otitis externa of right ear, unspecified type    ED Discharge Orders         Ordered    amoxicillin (AMOXIL) 500 MG capsule  2 times daily     06/12/18 1048           Volanda Napoleon, PA-C 06/12/18 [place], [place], DO 06/12/18 1544

## 2018-06-12 NOTE — ED Triage Notes (Signed)
Pt c/o RT ear pain since last pm 

## 2018-06-12 NOTE — Discharge Instructions (Signed)
You can take 1000 mg of Tylenol.  Do not exceed 4000 mg of Tylenol a day.  Take antibiotics as directed. Please take all of your antibiotics until finished.  Follow-up with [facility] to establish a primary care doctor if you do not have one.   Return emergency department for any fever, worsening pain, redness or swelling of the face, rash on face or any other worsening or concerning symptoms.

## 2018-06-12 NOTE — ED Notes (Signed)
C/o rt ear pain onset last pm , pain increased w chewing and swallowing Denies drainage,  Feels like bubbles in ear,  Has had some congestion  Pt is 32 weeks preg

## 2018-06-16 ENCOUNTER — Other Ambulatory Visit: Payer: Medicaid Other

## 2018-06-17 ENCOUNTER — Encounter (HOSPITAL_COMMUNITY): Payer: Self-pay

## 2018-06-17 ENCOUNTER — Encounter: Payer: Self-pay | Admitting: Certified Nurse Midwife

## 2018-06-17 ENCOUNTER — Ambulatory Visit (HOSPITAL_COMMUNITY)
Admission: RE | Admit: 2018-06-17 | Discharge: 2018-06-17 | Disposition: A | Discharge status: Home / Self Care | Payer: Medicaid Other | Source: Ambulatory Visit | Attending: Certified Nurse Midwife | Admitting: Certified Nurse Midwife

## 2018-06-17 ENCOUNTER — Ambulatory Visit (INDEPENDENT_AMBULATORY_CARE_PROVIDER_SITE_OTHER): Payer: Medicaid Other | Admitting: Certified Nurse Midwife

## 2018-06-17 VITALS — BP 103/68 | Wt 291.0 lb

## 2018-06-17 DIAGNOSIS — O99213 Obesity complicating pregnancy, third trimester: Secondary | ICD-10-CM | POA: Diagnosis not present

## 2018-06-17 DIAGNOSIS — Z23 Encounter for immunization: Secondary | ICD-10-CM

## 2018-06-17 DIAGNOSIS — O24113 Pre-existing diabetes mellitus, type 2, in pregnancy, third trimester: Secondary | ICD-10-CM

## 2018-06-17 DIAGNOSIS — O3433 Maternal care for cervical incompetence, third trimester: Secondary | ICD-10-CM

## 2018-06-17 DIAGNOSIS — O24313 Unspecified pre-existing diabetes mellitus in pregnancy, third trimester: Secondary | ICD-10-CM | POA: Diagnosis present

## 2018-06-17 DIAGNOSIS — Z794 Long term (current) use of insulin: Secondary | ICD-10-CM | POA: Diagnosis present

## 2018-06-17 DIAGNOSIS — O0990 Supervision of high risk pregnancy, unspecified, unspecified trimester: Secondary | ICD-10-CM

## 2018-06-17 DIAGNOSIS — O0930 Supervision of pregnancy with insufficient antenatal care, unspecified trimester: Secondary | ICD-10-CM

## 2018-06-17 DIAGNOSIS — O09213 Supervision of pregnancy with history of pre-term labor, third trimester: Secondary | ICD-10-CM | POA: Diagnosis not present

## 2018-06-17 DIAGNOSIS — Z3A33 33 weeks gestation of pregnancy: Secondary | ICD-10-CM

## 2018-06-17 DIAGNOSIS — IMO0001 Reserved for inherently not codable concepts without codable children: Secondary | ICD-10-CM

## 2018-06-17 MED ORDER — HYDROXYPROGESTERONE CAPROATE 275 MG/1.1ML ~~LOC~~ SOAJ
275.0000 mg | SUBCUTANEOUS | Status: AC
  Administered 2018-06-17 – 2018-07-01 (×3): 275 mg via SUBCUTANEOUS

## 2018-06-17 NOTE — Progress Notes (Signed)
Pt c/o increased pressure 

## 2018-06-17 NOTE — Progress Notes (Signed)
PRENATAL VISIT NOTE  Subjective:  Traci Peterson is a 33 y.o. G3P0200 at [redacted]w[redacted]d being seen today for ongoing prenatal care.  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, yellow 6 and red food colorings - moderate to severe respiratory distress; Chronic headaches -  no meds; Family history of breast cancer in female; Gall stones; Incompetent cervix; MDD (major depressive disorder), recurrent episode, severe (HCC); Vitamin D deficiency; Type 2 diabetes mellitus (HCC); Pre-existing type 2 diabetes mellitus during pregnancy, antepartum; Supervision of high risk pregnancy, antepartum; Trichomonal vaginitis during pregnancy; Heartburn in pregnancy; and Gestational diabetes mellitus (GDM) in third trimester controlled on oral hypoglycemic drug on their problem list.  Patient reports pelvic pressure.  Contractions: Irritability. Vag. Bleeding: None.  Movement: Present. Denies leaking of fluid.   The following portions of the patient's history were reviewed and updated as appropriate: allergies, current medications, past family history, past medical history, past social history, past surgical history and problem list. Problem list updated.  Objective:   Vitals:   06/17/18 0956  BP: 103/68  Weight: 291 lb (132 kg)    Fetal Status: Fetal Heart Rate (bpm): 140   Movement: Present     General:  Alert, oriented and cooperative. Patient is in no acute distress.  Skin: Skin is warm and dry. No rash noted.   Cardiovascular: Normal heart rate noted  Respiratory: Normal respiratory effort, no problems with respiration noted  Abdomen: Soft, gravid, appropriate for gestational age.  Pain/Pressure: Present     Pelvic: Cervical exam performed Dilation: Closed Effacement (%): Thick Station: -3  Extremities: Normal range of motion.  Edema: Trace    Mental Status: Normal mood and affect. Normal behavior. Normal judgment and thought content.   Assessment and Plan:  Pregnancy: G3P0200 at [redacted]w[redacted]d  1. Need for Tdap vaccination - Tdap vaccine greater than or equal to 7yo IM  2. Supervision of high risk pregnancy, antepartum - Pt reports increased pelvic pressure, reports pressure when walking or changing positions, denies vaginal bleeding, discharge or regular contractions  - Sterile speculum examination performed, cerclage in place no bulging or pulling of cerclage noted.  - Cervical examination around cerclage after speculum examination  - Anticipatory guidance on upcoming appointments with continuation of weekly Makena injections, weekly BPPs and cerclage removal at 36 weeks.  - Preterm labor precautions discussed  - BPP completed today 8/8  3. Type 2 diabetes during pregnancy, insulin  -  did not bring log to appointment  - reports taking insulin as increased at last prenatal appointments  - Educated and discussed importance of glucose control during pregnancy and risk to fetus with uncontrolled DM, patient verbalizes understanding  - Reiterated diabetic diet and lower carb diet   Preterm labor symptoms and general obstetric precautions including but not limited to vaginal bleeding, contractions, leaking of fluid and fetal movement were reviewed in detail with the patient. Please refer to After Visit Summary for other counseling recommendations.  Return in about 1 week (around 06/24/2018).  Future Appointments  Date Time Provider Department Center  06/24/2018  8:30 AM [facility] [place] 1 [facility]  06/24/2018 10:00 AM [facility] NURSE [facility]  07/01/2018  8:30 AM [facility] [place] [facility]  07/01/2018 10:00 AM Sharyon Cable, CNM [facility]  07/07/2018  9:00 AM Lesly Dukes, MD [facility]    Sharyon Cable,  CNM

## 2018-06-23 ENCOUNTER — Encounter: Payer: Self-pay | Admitting: Obstetrics & Gynecology

## 2018-06-24 ENCOUNTER — Encounter (HOSPITAL_COMMUNITY): Payer: Self-pay

## 2018-06-24 ENCOUNTER — Ambulatory Visit (INDEPENDENT_AMBULATORY_CARE_PROVIDER_SITE_OTHER): Payer: Medicaid Other | Admitting: *Deleted

## 2018-06-24 ENCOUNTER — Ambulatory Visit (HOSPITAL_COMMUNITY)
Admission: RE | Admit: 2018-06-24 | Discharge: 2018-06-24 | Disposition: A | Discharge status: Home / Self Care | Payer: Medicaid Other | Source: Ambulatory Visit | Attending: Certified Nurse Midwife | Admitting: Certified Nurse Midwife

## 2018-06-24 DIAGNOSIS — O24113 Pre-existing diabetes mellitus, type 2, in pregnancy, third trimester: Secondary | ICD-10-CM | POA: Diagnosis not present

## 2018-06-24 DIAGNOSIS — O99213 Obesity complicating pregnancy, third trimester: Secondary | ICD-10-CM

## 2018-06-24 DIAGNOSIS — Z794 Long term (current) use of insulin: Secondary | ICD-10-CM | POA: Insufficient documentation

## 2018-06-24 DIAGNOSIS — O3433 Maternal care for cervical incompetence, third trimester: Secondary | ICD-10-CM

## 2018-06-24 DIAGNOSIS — IMO0001 Reserved for inherently not codable concepts without codable children: Secondary | ICD-10-CM

## 2018-06-24 DIAGNOSIS — O24313 Unspecified pre-existing diabetes mellitus in pregnancy, third trimester: Secondary | ICD-10-CM | POA: Diagnosis not present

## 2018-06-24 DIAGNOSIS — O09213 Supervision of pregnancy with history of pre-term labor, third trimester: Secondary | ICD-10-CM | POA: Diagnosis not present

## 2018-06-24 DIAGNOSIS — Z3A34 34 weeks gestation of pregnancy: Secondary | ICD-10-CM

## 2018-06-24 DIAGNOSIS — N883 Incompetence of cervix uteri: Secondary | ICD-10-CM

## 2018-06-24 NOTE — Progress Notes (Signed)
Pt here for Makena injection.

## 2018-06-25 ENCOUNTER — Encounter (HOSPITAL_COMMUNITY): Payer: Self-pay

## 2018-06-25 ENCOUNTER — Ambulatory Visit (HOSPITAL_COMMUNITY)
Admission: EM | Admit: 2018-06-25 | Discharge: 2018-06-25 | Disposition: A | Discharge status: Home / Self Care | Payer: Medicaid Other | Attending: Family Medicine | Admitting: Family Medicine

## 2018-06-25 DIAGNOSIS — H9201 Otalgia, right ear: Secondary | ICD-10-CM | POA: Diagnosis not present

## 2018-06-25 NOTE — ED Triage Notes (Signed)
Pt presents ear pain in both ears and believes she may have a piece of cotton stuck in her right ear.

## 2018-06-25 NOTE — Discharge Instructions (Signed)
Cotton removed  May consider flonase to help with ear discomfort- may consult OBGYN first

## 2018-06-26 NOTE — ED Provider Notes (Signed)
[facility]    CSN: [id number] Arrival date & time: 06/25/18  1543     History   Chief Complaint Chief Complaint  Patient presents with  . Otalgia    HPI Traci Peterson is a 33 y.o. female [redacted] weeks pregnant presenting today for evaluation of ear pain.  Patient states that she has had mainly discomfort in her right ear, but over the past couple weeks she has had bilateral ear discomfort.  Patient was recently treated for otitis externa with oral amoxicillin.  She has not seen any improvement in her discomfort.  Patient states that she has been congested for most of her pregnancy but has not been taking any medicines for this.  She denies any cough or sore throat.  Denies fevers.  Often feels a sensation of fluid or water in her ear.  She is concerned about that being a piece of cotton in her ear as she was recently using Q-tips.  HPI  Past Medical History:  Diagnosis Date  . Anxiety    no current med.  . Cervix prolapsed into vagina   . Chronic headaches    otc med prn  . Constipation   . Depression    no meds currently  . Diabetes mellitus without complication ([place])    type 2  . Gall stones    Resolved with surgery  . GERD (gastroesophageal reflux disease)   . HPV in female   . Jaw snapping    states jaw pops if opens mouth too wide  . Ovarian cyst   . PCOS (polycystic ovarian syndrome)   . Pilonidal cyst 02/2013  . Preterm labor 2016   PPROM @ 18 wks, delivery of IUFD @ 21 wks  . Seasonal allergies   . SVD (spontaneous vaginal delivery)    x 2 - both fetal demises 2nd trim.  . Vaginal Pap smear, abnormal     Patient Active Problem List   Diagnosis Date Noted  . Gestational diabetes mellitus (GDM) in third trimester controlled on oral hypoglycemic drug 05/08/2018  . Heartburn in pregnancy 02/13/2018  . Trichomonal vaginitis during pregnancy 01/20/2018  . Supervision of high risk pregnancy, antepartum 01/01/2018  . Pre-existing type 2 diabetes  mellitus during pregnancy, antepartum 12/16/2017  . Type 2 diabetes mellitus ([place]) 12/15/2017  . Vitamin D deficiency 12/10/2017  . MDD (major depressive disorder), recurrent episode, severe ([place]) 05/29/2017  . Incompetent cervix 09/12/2016  . Gall stones 07/19/2016  . Family history of breast cancer in female 06/06/2016  . Obesity in pregnancy 10/25/2014  . Eczema 10/25/2014  . 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 Chronic headaches -  no meds 10/25/2014  . Anxiety and depression 11/30/2013  . PCOS (polycystic ovarian syndrome) 04/20/2013    Past Surgical History:  Procedure Laterality Date  . CERVICAL CERCLAGE N/A 01/21/2018   Procedure: CERCLAGE CERVICAL;  Surgeon: Guss Bunde, MD;  Location: [place];  Service: Gynecology;  Laterality: N/A;  . CHOLECYSTECTOMY N/A 07/21/2016   Procedure: LAPAROSCOPIC CHOLECYSTECTOMY;  Surgeon: Coralie Keens, MD;  Location: [place];  Service: General;  Laterality: N/A;  . PILONIDAL CYST EXCISION N/A 03/12/2013   Procedure: CYST EXCISION PILONIDAL EXTENSIVE;  Surgeon: Harl Bowie, MD;  Location: [place];  Service: General;  Laterality: N/A;  . WISDOM TOOTH EXTRACTION      OB History    Gravida  3  Para  2   Term  0   Preterm  2   AB  0   Living  0     SAB  0   TAB  0   Ectopic  0   Multiple  1   Live Births  2            Home Medications    Prior to Admission medications   Medication Sig Start Date End Date Taking? Authorizing Provider  ACCU-CHEK FASTCLIX LANCETS MISC 1 Units by Percutaneous route 4 (four) times daily. 11/30/17   Jorje Guild, NP  amoxicillin (AMOXIL) 500 MG capsule Take 1 capsule (500 mg total) by mouth 2 (two) times daily. Patient not taking: Reported on 06/24/2018 06/12/18   Volanda Napoleon, PA-C  aspirin 81 MG chewable tablet Chew 1 tablet (81 mg total) by mouth daily. 05/27/18    Julianne Handler, CNM  Blood Glucose Monitoring Suppl (ACCU-CHEK NANO SMARTVIEW) w/Device KIT 1 kit by Subdermal route as directed. Check blood sugars for fasting, and two hours after breakfast, lunch and dinner (4 checks daily) 11/30/17   Jorje Guild, NP  Cholecalciferol (VITAMIN D3) 2000 units TABS Take 2,000 Units by mouth daily.    [provider]  esomeprazole (NEXIUM) 20 MG capsule Take one tablet daily in the morning. 05/30/18   Emily Filbert, MD  glucose blood (ACCU-CHEK SMARTVIEW) test strip Use as instructed to check blood sugars 11/30/17   Jorje Guild, NP  insulin glargine (LANTUS) 100 UNIT/ML injection Inject 0.1 mLs (10 Units total) into the skin at bedtime. 06/02/18   Emily Filbert, MD  metFORMIN (GLUCOPHAGE) 1000 MG tablet Take 1 tablet (1,000 mg total) by mouth 2 (two) times daily with a meal. Patient not taking: Reported on 06/24/2018 05/30/18   Emily Filbert, MD  Prenatal Multivit-Min-Fe-FA (PRENATAL VITAMINS PO) Take 1 tablet by mouth daily.     [provider]  Vitamin D, Ergocalciferol, (DRISDOL) 1.25 MG (50000 UT) CAPS capsule Take 1 capsule (50,000 Units total) by mouth every 7 (seven) days. 05/22/18   Emily Filbert, MD    Family History Family History  Problem Relation Age of Onset  . Hypertension Mother   . Hyperlipidemia Mother   . Diabetes Mother   . Thyroid disease Mother   . Diabetes Father   . Hypertension Father   . Alcohol abuse Father   . Breast cancer Maternal Aunt   . Lung cancer Paternal Grandfather     Social History Social History   Tobacco Use  . Smoking status: Former Smoker    Packs/day: 0.25    Years: 12.00    Pack years: 3.00    Types: Cigarettes    Last attempt to quit: 01/10/2018    Years since quitting: 0.4  . Smokeless tobacco: Never Used  Substance Use Topics  . Alcohol use: No    Frequency: Never    Comment: socially but none with pregnancy  . Drug use: Not Currently    Types: Hydrocodone    Comment: prescribed this  medication     Allergies   Lactose intolerance (gi)   Review of Systems Review of Systems  Constitutional: Negative for activity change, appetite change, chills, fatigue and fever.  HENT: Positive for congestion, ear pain and rhinorrhea. Negative for sinus pressure, sore throat and trouble swallowing.   Eyes: Negative for discharge and redness.  Respiratory: Negative for cough, chest tightness and shortness of breath.   Cardiovascular: Negative for chest pain.  Gastrointestinal: Negative for abdominal pain, diarrhea, nausea and vomiting.  Musculoskeletal: Negative for myalgias.  Skin: Negative for rash.  Neurological: Negative for dizziness, light-headedness and headaches.     Physical Exam Triage Vital Signs ED Triage Vitals  Enc Vitals Group     BP 06/25/18 1629 127/65     Pulse Rate 06/25/18 1629 94     Resp 06/25/18 1629 20     Temp 06/25/18 1629 98 F (36.7 C)     Temp Source 06/25/18 1629 Oral     SpO2 06/25/18 1629 100 %     Weight --      Height --      Head Circumference --      Peak Flow --      Pain Score 06/25/18 1632 8     Pain Loc --      Pain Edu? --      Excl. in [place]? --    No data found.  Updated Vital Signs BP 127/65 (BP Location: Right Arm)   Pulse 94   Temp 98 F (36.7 C) (Oral)   Resp 20   LMP 10/27/2017 (Exact Date)   SpO2 100%   Visual Acuity Right Eye Distance:   Left Eye Distance:   Bilateral Distance:    Right Eye Near:   Left Eye Near:    Bilateral Near:     Physical Exam Vitals signs and nursing note reviewed.  Constitutional:      General: She is not in acute distress.    Appearance: She is well-developed.  HENT:     Head: Normocephalic and atraumatic.     Ears:     Comments: Bilateral TMs appear to have effusion/slightly opaque, nonerythematous  Right canal with small amount of cotton deep in the canal, removed and TM still intact    Mouth/Throat:     Comments: Oral mucosa pink and moist, no tonsillar enlargement  or exudate. Posterior pharynx patent and nonerythematous, no uvula deviation or swelling. Normal phonation. Eyes:     Conjunctiva/sclera: Conjunctivae normal.  Neck:     Musculoskeletal: Neck supple.  Cardiovascular:     Rate and Rhythm: Normal rate and regular rhythm.     Heart sounds: No murmur.  Pulmonary:     Effort: Pulmonary effort is normal. No respiratory distress.     Breath sounds: Normal breath sounds.     Comments: Breathing comfortably at rest, CTABL, no wheezing, rales or other adventitious sounds auscultated Abdominal:     Palpations: Abdomen is soft.     Tenderness: There is no abdominal tenderness.  Skin:    General: Skin is warm and dry.  Neurological:     Mental Status: She is alert.      UC Treatments / Results  Labs (all labs ordered are listed, but only abnormal results are displayed) Labs Reviewed - No data to display  EKG None  Radiology No results found.  Procedures Procedures (including critical care time)  Medications Ordered in UC Medications - No data to display  Initial Impression / Assessment and Plan / UC Course  I have reviewed the triage vital signs and the nursing notes.  Pertinent labs & imaging results that were available during my care of the patient were reviewed by me and considered in my medical decision making (see chart for details).     No sign of infection in bilateral ears, cotton removed.  Ear pain likely secondary to congestion with eustachian tube dysfunction.  Discussed with patient  time getting to OB/GYN about use of Flonase and possibly Zyrtec to help with this.  Continue to monitor, Tylenol for pain,Discussed strict return precautions. Patient verbalized understanding and is agreeable with plan.  Final Clinical Impressions(s) / UC Diagnoses   Final diagnoses:  Right ear pain     Discharge Instructions     Cotton removed  May consider flonase to help with ear discomfort- may consult OBGYN first   ED  Prescriptions    None     Controlled Substance Prescriptions [place] Controlled Substance Registry consulted? Not Applicable   Janith Lima, [place] 06/26/18 1119

## 2018-07-01 ENCOUNTER — Encounter (HOSPITAL_COMMUNITY): Payer: Self-pay | Admitting: *Deleted

## 2018-07-01 ENCOUNTER — Ambulatory Visit (HOSPITAL_COMMUNITY)
Admission: RE | Admit: 2018-07-01 | Discharge: 2018-07-01 | Disposition: A | Discharge status: Home / Self Care | Payer: Medicaid Other | Source: Ambulatory Visit | Attending: Certified Nurse Midwife | Admitting: Certified Nurse Midwife

## 2018-07-01 ENCOUNTER — Encounter (HOSPITAL_COMMUNITY): Payer: Self-pay

## 2018-07-01 ENCOUNTER — Telehealth (HOSPITAL_COMMUNITY): Payer: Self-pay | Admitting: *Deleted

## 2018-07-01 ENCOUNTER — Ambulatory Visit (INDEPENDENT_AMBULATORY_CARE_PROVIDER_SITE_OTHER): Payer: Medicaid Other | Admitting: Certified Nurse Midwife

## 2018-07-01 ENCOUNTER — Other Ambulatory Visit (HOSPITAL_COMMUNITY): Payer: Self-pay | Admitting: *Deleted

## 2018-07-01 ENCOUNTER — Encounter: Payer: Self-pay | Admitting: Certified Nurse Midwife

## 2018-07-01 VITALS — BP 109/68 | HR 100 | Wt 290.0 lb

## 2018-07-01 DIAGNOSIS — O24113 Pre-existing diabetes mellitus, type 2, in pregnancy, third trimester: Secondary | ICD-10-CM

## 2018-07-01 DIAGNOSIS — O99343 Other mental disorders complicating pregnancy, third trimester: Secondary | ICD-10-CM

## 2018-07-01 DIAGNOSIS — O23599 Infection of other part of genital tract in pregnancy, unspecified trimester: Secondary | ICD-10-CM | POA: Diagnosis present

## 2018-07-01 DIAGNOSIS — Z362 Encounter for other antenatal screening follow-up: Secondary | ICD-10-CM

## 2018-07-01 DIAGNOSIS — O3433 Maternal care for cervical incompetence, third trimester: Secondary | ICD-10-CM | POA: Diagnosis not present

## 2018-07-01 DIAGNOSIS — Z3A35 35 weeks gestation of pregnancy: Secondary | ICD-10-CM

## 2018-07-01 DIAGNOSIS — O24313 Unspecified pre-existing diabetes mellitus in pregnancy, third trimester: Principal | ICD-10-CM

## 2018-07-01 DIAGNOSIS — IMO0001 Reserved for inherently not codable concepts without codable children: Secondary | ICD-10-CM

## 2018-07-01 DIAGNOSIS — O0990 Supervision of high risk pregnancy, unspecified, unspecified trimester: Secondary | ICD-10-CM

## 2018-07-01 DIAGNOSIS — E1100 Type 2 diabetes mellitus with hyperosmolarity without nonketotic hyperglycemic-hyperosmolar coma (NKHHC): Secondary | ICD-10-CM | POA: Insufficient documentation

## 2018-07-01 DIAGNOSIS — O99213 Obesity complicating pregnancy, third trimester: Secondary | ICD-10-CM

## 2018-07-01 DIAGNOSIS — F32A Depression, unspecified: Secondary | ICD-10-CM

## 2018-07-01 DIAGNOSIS — O99210 Obesity complicating pregnancy, unspecified trimester: Secondary | ICD-10-CM

## 2018-07-01 DIAGNOSIS — O09219 Supervision of pregnancy with history of pre-term labor, unspecified trimester: Secondary | ICD-10-CM | POA: Diagnosis not present

## 2018-07-01 DIAGNOSIS — O0993 Supervision of high risk pregnancy, unspecified, third trimester: Secondary | ICD-10-CM

## 2018-07-01 DIAGNOSIS — A5901 Trichomonal vulvovaginitis: Secondary | ICD-10-CM | POA: Diagnosis present

## 2018-07-01 DIAGNOSIS — F419 Anxiety disorder, unspecified: Secondary | ICD-10-CM

## 2018-07-01 DIAGNOSIS — O24414 Gestational diabetes mellitus in pregnancy, insulin controlled: Secondary | ICD-10-CM | POA: Diagnosis not present

## 2018-07-01 DIAGNOSIS — F329 Major depressive disorder, single episode, unspecified: Secondary | ICD-10-CM

## 2018-07-01 DIAGNOSIS — Z794 Long term (current) use of insulin: Secondary | ICD-10-CM | POA: Insufficient documentation

## 2018-07-01 DIAGNOSIS — O24119 Pre-existing diabetes mellitus, type 2, in pregnancy, unspecified trimester: Secondary | ICD-10-CM

## 2018-07-01 LAB — GLUCOSE, POCT (MANUAL RESULT ENTRY): POC Glucose: 217 mg/dl — AB (ref 70–99)

## 2018-07-01 NOTE — Progress Notes (Signed)
PRENATAL VISIT NOTE  Subjective:  Traci Peterson is a 33 y.o. G3P0200 at [redacted]w[redacted]d being seen today for ongoing prenatal care.  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, yellow 6 and red food colorings - moderate to severe respiratory distress; Chronic headaches -  no meds; Family history of breast cancer in female; Gall stones; Incompetent cervix; MDD (major depressive disorder), recurrent episode, severe (HCC); Vitamin D deficiency; Type 2 diabetes mellitus (HCC); Pre-existing type 2 diabetes mellitus during pregnancy, antepartum; Supervision of high risk pregnancy, antepartum; Trichomonal vaginitis during pregnancy; Heartburn in pregnancy; and Insulin controlled gestational diabetes mellitus (GDM) during pregnancy on their problem list.  Patient reports no complaints.  Contractions: Irritability. Vag. Bleeding: None.  Movement: Present. Denies leaking of fluid.   The following portions of the patient's history were reviewed and updated as appropriate: allergies, current medications, past family history, past medical history, past social history, past surgical history and problem list. Problem list updated.  Objective:   Vitals:   07/01/18 1009  BP: 109/68  Pulse: 100  Weight: 290 lb (131.5 kg)    Fetal Status: Fetal Heart Rate (bpm): 157 Fundal Height: 42 cm Movement: Present     General:  Alert, oriented and cooperative. Patient is in no acute distress.  Skin: Skin is warm and dry. No rash noted.   Cardiovascular: Normal heart rate noted  Respiratory: Normal respiratory effort, no problems with respiration noted  Abdomen: Soft, gravid, appropriate for gestational age.  Pain/Pressure: Present     Pelvic: Cervical exam deferred        Extremities: Normal range of motion.  Edema: Trace  Mental Status: Normal mood and  affect. Normal behavior. Normal judgment and thought content.   Assessment and Plan:  Pregnancy: G3P0200 at [redacted]w[redacted]d  1. Supervision of high risk pregnancy, antepartum - Poor compliance with insulin/ poor control of diabetes  - IOL scheduled for 37 weeks  - Cerclage removal scheduled for next week   2. Obesity in pregnancy TWG 26lbs  -Est. FW: 2738  gm   6 lb 1 oz   70  % done today   3. Anxiety and depression  4. Insulin controlled gestational diabetes mellitus (GDM) in third trimester - Patient has not been taking insulin or taking sugars at home, reports she "does not know the last time I picked up insulin from pharmacy"  - CBGs done in office over course of pregnancy have been elevated ranging from 127-161. Patient ate prior to coming to appointment 1hr PP CBG done in office 217, 2hr CBG 182.  - Educated and discussed importance of management of diabetes and the risk to fetus with increased levels including still birth, NICU admission or risk for shoulder dystocia - Consulted with Dr Judeth Cornfield after [place] appointment who recommends IOL at 37 weeks due to poor control and poor compliance  - Discussed importance of starting back taking insulin and tracking CBG/bringing in log to next appointment, if no log brought or CBGs continue to be elevated at next appointment might consider inpatient management until delivery at 37 weeks.  - HgB A1c - POCT Glucose (CBG)  Preterm labor symptoms and general obstetric precautions including but not limited to vaginal bleeding, contractions, leaking of fluid and fetal movement were reviewed in detail with the patient. Please refer to After Visit Summary for other counseling recommendations.   Future  Appointments  Date Time Provider Department Center  07/07/2018  9:00 AM Lesly Dukes, MD [facility]  07/08/2018  2:15 PM [facility] [place] 4 [facility]  07/13/2018 12:00 AM [facility] ROOM [facility] None    Sharyon Cable, CNM

## 2018-07-01 NOTE — Patient Instructions (Signed)

## 2018-07-01 NOTE — Telephone Encounter (Signed)
Preadmission screen  

## 2018-07-02 ENCOUNTER — Telehealth: Payer: Self-pay | Admitting: *Deleted

## 2018-07-02 LAB — HEMOGLOBIN A1C
Hgb A1c MFr Bld: 7.2 % of total Hgb — ABNORMAL HIGH (ref <5.7)
Mean Plasma Glucose: 160 (calc)
eAG (mmol/L): 8.9 (calc)

## 2018-07-02 MED ORDER — "INSULIN SYRINGE-NEEDLE U-100 30G X 1/2"" 1 ML MISC": 1.0000 | Freq: Every day | 6 refills | Status: DC

## 2018-07-02 NOTE — Telephone Encounter (Signed)
Pt send message through my chart stating that she need insulin syringes and needles sent to [facility] [address] in [place]

## 2018-07-06 ENCOUNTER — Inpatient Hospital Stay (HOSPITAL_COMMUNITY)
Admission: AD | Admit: 2018-07-06 | Discharge: 2018-07-10 | DRG: 786 | Disposition: A | Discharge status: Home / Self Care | Payer: Medicaid Other | Attending: Obstetrics and Gynecology | Admitting: Obstetrics and Gynecology

## 2018-07-06 ENCOUNTER — Encounter (HOSPITAL_COMMUNITY): Payer: Self-pay | Admitting: Emergency Medicine

## 2018-07-06 DIAGNOSIS — Z794 Long term (current) use of insulin: Secondary | ICD-10-CM

## 2018-07-06 DIAGNOSIS — O2412 Pre-existing diabetes mellitus, type 2, in childbirth: Principal | ICD-10-CM | POA: Diagnosis present

## 2018-07-06 DIAGNOSIS — O24119 Pre-existing diabetes mellitus, type 2, in pregnancy, unspecified trimester: Secondary | ICD-10-CM

## 2018-07-06 DIAGNOSIS — M340 Progressive systemic sclerosis: Secondary | ICD-10-CM

## 2018-07-06 DIAGNOSIS — N883 Incompetence of cervix uteri: Secondary | ICD-10-CM | POA: Diagnosis present

## 2018-07-06 DIAGNOSIS — O288 Other abnormal findings on antenatal screening of mother: Secondary | ICD-10-CM

## 2018-07-06 DIAGNOSIS — O289 Unspecified abnormal findings on antenatal screening of mother: Secondary | ICD-10-CM | POA: Diagnosis present

## 2018-07-06 DIAGNOSIS — O24414 Gestational diabetes mellitus in pregnancy, insulin controlled: Secondary | ICD-10-CM

## 2018-07-06 DIAGNOSIS — O9902 Anemia complicating childbirth: Secondary | ICD-10-CM | POA: Diagnosis present

## 2018-07-06 DIAGNOSIS — Z3A36 36 weeks gestation of pregnancy: Secondary | ICD-10-CM

## 2018-07-06 DIAGNOSIS — E1165 Type 2 diabetes mellitus with hyperglycemia: Secondary | ICD-10-CM | POA: Diagnosis present

## 2018-07-06 DIAGNOSIS — O99334 Smoking (tobacco) complicating childbirth: Secondary | ICD-10-CM | POA: Diagnosis present

## 2018-07-06 DIAGNOSIS — IMO0002 Reserved for concepts with insufficient information to code with codable children: Secondary | ICD-10-CM | POA: Diagnosis not present

## 2018-07-06 DIAGNOSIS — O3663X0 Maternal care for excessive fetal growth, third trimester, not applicable or unspecified: Secondary | ICD-10-CM | POA: Diagnosis present

## 2018-07-06 DIAGNOSIS — D649 Anemia, unspecified: Secondary | ICD-10-CM | POA: Diagnosis present

## 2018-07-06 DIAGNOSIS — O3433 Maternal care for cervical incompetence, third trimester: Secondary | ICD-10-CM | POA: Diagnosis present

## 2018-07-06 DIAGNOSIS — F17210 Nicotine dependence, cigarettes, uncomplicated: Secondary | ICD-10-CM | POA: Diagnosis present

## 2018-07-06 LAB — URINALYSIS, MICROSCOPIC (REFLEX)

## 2018-07-06 LAB — URINALYSIS, ROUTINE W REFLEX MICROSCOPIC
Bilirubin Urine: NEGATIVE
Glucose, UA: NEGATIVE mg/dL
Ketones, ur: 15 mg/dL — AB
Nitrite: NEGATIVE
PH: 6.5 (ref 5.0–8.0)
Protein, ur: 30 mg/dL — AB
Specific Gravity, Urine: 1.025 (ref 1.005–1.030)

## 2018-07-06 LAB — GLUCOSE, CAPILLARY: Glucose-Capillary: 132 mg/dL — ABNORMAL HIGH (ref 70–99)

## 2018-07-06 MED ORDER — CYCLOBENZAPRINE HCL 10 MG PO TABS
10.0000 mg | ORAL_TABLET | Freq: Once | ORAL | Status: AC
  Administered 2018-07-06: 10 mg via ORAL
  Filled 2018-07-06: qty 1

## 2018-07-06 NOTE — MAU Provider Note (Signed)
History     CSN: [id number]  Arrival date and time: 07/06/18 2020   First Provider Initiated Contact with Patient 07/06/18 2137      Chief Complaint  Patient presents with  . Back Pain   Traci Peterson is a 33 y.o. G3P0200 at 49w0dwho presents for Back Pain.  She states the pain has been present for the last week and intensified after getting a massage to improve the pain.  She states that the pain was immediately intensified and feels like sharp stabbing pain that radiates on her left side.  She states she has taken tylenol and has tried hot showers without relief. She states the pain is constant and is centralized to the one spot.  She endorses a history of back pain and was actually taken out of work for her injury.  She reports that rest and pain medications assisted with that incident.  She denies vaginal concerns including discharge, itching, burning, and odor.  However, she reports some "pressure, but because of how low his head is."  She also denies constipation, diarrhea, or issues with urination.  She has not checked her BG today and reports having chicken noodle soup and sparkling water about one hour prior to arrival.  She endorses good fetal movement and denies contractions, VB, and LOF.       OB History    Gravida  3   Para  2   Term  0   Preterm  2   AB  0   Living  0     SAB  0   TAB  0   Ectopic  0   Multiple  1   Live Births  2           Past Medical History:  Diagnosis Date  . Anxiety    no current med.  . Cervix prolapsed into vagina   . Chronic headaches    otc med prn  . Complication of anesthesia    narrow airway and had difficulty breathing after cholecystectomy  . Constipation   . Depression    no meds currently  . [place]    type 2  . Gall stones    Resolved with surgery  . GERD (gastroesophageal reflux disease)   . Gestational diabetes   . HPV in female   . Jaw snapping    states jaw  pops if opens mouth too wide  . Ovarian cyst   . PCOS (polycystic ovarian syndrome)   . Pilonidal cyst 02/2013  . Preterm labor 2016   PPROM @ 18 wks, delivery of IUFD @ 21 wks  . Seasonal allergies   . SVD (spontaneous vaginal delivery)    x 2 - both fetal demises 2nd trim.  . Vaginal Pap smear, abnormal     Past Surgical History:  Procedure Laterality Date  . CERVICAL CERCLAGE N/A 01/21/2018   Procedure: CERCLAGE CERVICAL;  Surgeon: LGuss Bunde MD;  [place]  Service: Gynecology;  Laterality: N/A;  . CHOLECYSTECTOMY N/A 07/21/2016   Procedure: LAPAROSCOPIC CHOLECYSTECTOMY;  Surgeon: DCoralie Keens MD;  [place]  Service: General;  Laterality: N/A;  . PILONIDAL CYST EXCISION N/A 03/12/2013   Procedure: CYST EXCISION PILONIDAL EXTENSIVE;  Surgeon: DHarl Bowie MD;  [place]  Service: General;  Laterality: N/A;  . WISDOM TOOTH EXTRACTION      Family History  Problem Relation Age of Onset  . Hypertension Mother   .  Hyperlipidemia Mother   . Diabetes Mother   . Thyroid disease Mother   . Diabetes Father   . Hypertension Father   . Alcohol abuse Father   . Breast cancer Maternal Aunt   . Pancreatic cancer Paternal Grandfather     Social History   Tobacco Use  . Smoking status: Current Some Day Smoker    Packs/day: 0.25    Years: 12.00    Pack years: 3.00    Types: Cigarettes    Last attempt to quit: 01/10/2018    Years since quitting: 0.4  . Smokeless tobacco: Never Used  Substance Use Topics  . Alcohol use: No    Frequency: Never    Comment: socially but none with pregnancy  . Drug use: Not Currently    Types: Hydrocodone    Comment: prescribed this medication    Allergies:  Allergies  Allergen Reactions  . Lactose Intolerance (Gi) Diarrhea    Medications Prior to Admission  Medication Sig Dispense Refill Last Dose  . ACCU-CHEK FASTCLIX LANCETS MISC 1 Units by Percutaneous route 4 (four)  times daily. 100 each 12 Taking  . amoxicillin (AMOXIL) 500 MG capsule Take 1 capsule (500 mg total) by mouth 2 (two) times daily. (Patient not taking: Reported on 06/24/2018) 21 capsule 0 Not Taking  . aspirin 81 MG chewable tablet Chew 1 tablet (81 mg total) by mouth daily. 60 tablet 0 Taking  . Blood Glucose Monitoring Suppl (ACCU-CHEK NANO SMARTVIEW) w/Device KIT 1 kit by Subdermal route as directed. Check blood sugars for fasting, and two hours after breakfast, lunch and dinner (4 checks daily) 1 kit 0 Taking  . Cholecalciferol (VITAMIN D3) 2000 units TABS Take 2,000 Units by mouth daily.   Taking  . esomeprazole (NEXIUM) 20 MG capsule Take one tablet daily in the morning. 30 capsule 4 Taking  . glucose blood (ACCU-CHEK SMARTVIEW) test strip Use as instructed to check blood sugars 100 each 12 Taking  . insulin glargine (LANTUS) 100 UNIT/ML injection Inject 0.1 mLs (10 Units total) into the skin at bedtime. 10 mL 11 Taking  . Insulin Syringe-Needle U-100 (SAFETY INSULIN SYRINGES) 30G X 1/2" 1 ML MISC 1 Syringe by Does not apply route at bedtime. Disp 30 syringes to use with insulin daily at bedtime 30 each 6   . metFORMIN (GLUCOPHAGE) 1000 MG tablet Take 1 tablet (1,000 mg total) by mouth 2 (two) times daily with a meal. 60 tablet 3 Taking  . Prenatal Multivit-Min-Fe-FA (PRENATAL VITAMINS PO) Take 1 tablet by mouth daily.    Taking  . Vitamin D, Ergocalciferol, (DRISDOL) 1.25 MG (50000 UT) CAPS capsule Take 1 capsule (50,000 Units total) by mouth every 7 (seven) days. 12 capsule 0 Taking    Review of Systems  Constitutional: Negative for chills and fever.  Gastrointestinal: Positive for nausea and vomiting (This morning). Negative for constipation, diarrhea and rectal pain.  Genitourinary: Negative for dyspareunia, dysuria, vaginal bleeding, vaginal discharge and vaginal pain.  Musculoskeletal: Positive for back pain (Left Side).  Neurological: Negative for dizziness, light-headedness and  headaches.   Physical Exam   Blood pressure (!) 94/45, pulse 92, temperature 98.5 F (36.9 C), temperature source Oral, resp. rate 19, height 5' 2"  (1.575 m), last menstrual period 10/27/2017, SpO2 98 %.  Physical Exam  Constitutional: She is oriented to person, place, and time. She appears well-developed.  Morbid Obesity  HENT:  Head: Normocephalic and atraumatic.  Eyes: Conjunctivae are normal.  Neck: Normal range of motion.  Cardiovascular: Normal  rate, regular rhythm and normal heart sounds.  Respiratory: Effort normal and breath sounds normal.  GI: Soft. Bowel sounds are normal.  Large, Gravid, Appears S>D, Soft, NT  Musculoskeletal: Normal range of motion.  Neurological: She is alert and oriented to person, place, and time.  Skin: Skin is warm and dry.  Psychiatric: She has a normal mood and affect. Her behavior is normal.    Fetal Assessment 155 bpm, Mod Var, + Prolonged Decel x 1, +Accels Toco: None graphed or palpated  MAU Course   Results for orders placed or performed during the hospital encounter of 07/06/18 (from the past 24 hour(s))  Urinalysis, Routine w reflex microscopic     Status: Abnormal   Collection Time: 07/06/18 10:19 PM  Result Value Ref Range   Color, Urine YELLOW YELLOW   APPearance CLEAR CLEAR   Specific Gravity, Urine 1.025 1.005 - 1.030   pH 6.5 5.0 - 8.0   Glucose, UA NEGATIVE NEGATIVE mg/dL   Hgb urine dipstick MODERATE (A) NEGATIVE   Bilirubin Urine NEGATIVE NEGATIVE   Ketones, ur 15 (A) NEGATIVE mg/dL   Protein, ur 30 (A) NEGATIVE mg/dL   Nitrite NEGATIVE NEGATIVE   Leukocytes, UA SMALL (A) NEGATIVE  Urinalysis, Microscopic (reflex)     Status: Abnormal   Collection Time: 07/06/18 10:19 PM  Result Value Ref Range   RBC / HPF 6-10 0 - 5 RBC/hpf   WBC, UA 0-5 0 - 5 WBC/hpf   Bacteria, UA RARE (A) NONE SEEN   Squamous Epithelial / LPF 0-5 0 - 5  Glucose, capillary     Status: Abnormal   Collection Time: 07/06/18 10:25 PM  Result  Value Ref Range   Glucose-Capillary 132 (H) 70 - 99 mg/dL   FINDINGS: Right Kidney:  Renal measurements: 11.7 x 5.4 x 6.3 cm = volume: 207 mL. Echogenicity within normal limits. No mass or hydronephrosis visualized.  Left Kidney:  Renal measurements: 12.7 x 7.2 x 5.2 cm = volume: 246 mL. Echogenicity within normal limits. No mass or hydronephrosis visualized.  Bladder: Decompressed  IMPRESSION: No acute abnormality noted.  MDM PE Labs: UA,  Flexeril EFM-Continued BPP Renal US  Assessment and Plan  33 year old G3P0200 at 36 weeks Cat I FT Overall DM-A2 Back Pain: Left Side   -Exam findings discussed which present like musculoskeletal pain.   -Informed of need for BG monitoring regularly at home. -Will obtain one now -EFM reassuring overall, but will continue to monitor. -Will give flexeril 40m now for pain. -Patient to leave urine sample- order placed. -Will continue to monitor.    Follow Up 2353  -UA returns significant for moderate blood, ketones, and protein. -Will send [place] and Renal study -Will await results  Follow Up (1:45 AM) BPP 6/8  -(-2) for fetal movement -EFM still remains NR, but now lacks moderate variability.  -Will recommend admission for 24 hour observation and repeat BPP in 8 hours. -Dr. ERip Harbourconsulted with recommendation and agrees while advising; *Collect CBC with Diff *Start IV  *Continuous FM -Renal Study returns normal.  -Patient informed of results and POC.  No questions or concerns.  -Admit to Antepartum with standard orders  JMaryann ConnersMSN, CNM 07/06/2018, 9:37 PM

## 2018-07-06 NOTE — MAU Note (Signed)
Pt here with left sided back pain; spasm-like pain. Has cerclage in place that is due to be removed tomorrow in the office. Denies bleeding or leaking.

## 2018-07-07 ENCOUNTER — Encounter (HOSPITAL_COMMUNITY): Payer: Self-pay | Admitting: *Deleted

## 2018-07-07 ENCOUNTER — Inpatient Hospital Stay (HOSPITAL_COMMUNITY): Payer: Medicaid Other

## 2018-07-07 ENCOUNTER — Other Ambulatory Visit: Payer: Self-pay

## 2018-07-07 ENCOUNTER — Encounter: Payer: Self-pay | Admitting: Obstetrics & Gynecology

## 2018-07-07 DIAGNOSIS — O24113 Pre-existing diabetes mellitus, type 2, in pregnancy, third trimester: Secondary | ICD-10-CM | POA: Diagnosis not present

## 2018-07-07 DIAGNOSIS — O3433 Maternal care for cervical incompetence, third trimester: Secondary | ICD-10-CM

## 2018-07-07 DIAGNOSIS — O289 Unspecified abnormal findings on antenatal screening of mother: Secondary | ICD-10-CM

## 2018-07-07 DIAGNOSIS — O24424 Gestational diabetes mellitus in childbirth, insulin controlled: Secondary | ICD-10-CM | POA: Diagnosis not present

## 2018-07-07 DIAGNOSIS — O09213 Supervision of pregnancy with history of pre-term labor, third trimester: Secondary | ICD-10-CM

## 2018-07-07 DIAGNOSIS — Z3A36 36 weeks gestation of pregnancy: Secondary | ICD-10-CM | POA: Diagnosis not present

## 2018-07-07 DIAGNOSIS — O99213 Obesity complicating pregnancy, third trimester: Secondary | ICD-10-CM | POA: Diagnosis not present

## 2018-07-07 DIAGNOSIS — O3663X0 Maternal care for excessive fetal growth, third trimester, not applicable or unspecified: Secondary | ICD-10-CM | POA: Diagnosis present

## 2018-07-07 DIAGNOSIS — O2412 Pre-existing diabetes mellitus, type 2, in childbirth: Secondary | ICD-10-CM | POA: Diagnosis present

## 2018-07-07 DIAGNOSIS — D649 Anemia, unspecified: Secondary | ICD-10-CM | POA: Diagnosis present

## 2018-07-07 DIAGNOSIS — F17210 Nicotine dependence, cigarettes, uncomplicated: Secondary | ICD-10-CM | POA: Diagnosis present

## 2018-07-07 DIAGNOSIS — Z794 Long term (current) use of insulin: Secondary | ICD-10-CM | POA: Diagnosis not present

## 2018-07-07 DIAGNOSIS — O99334 Smoking (tobacco) complicating childbirth: Secondary | ICD-10-CM | POA: Diagnosis present

## 2018-07-07 DIAGNOSIS — E1165 Type 2 diabetes mellitus with hyperglycemia: Secondary | ICD-10-CM | POA: Diagnosis present

## 2018-07-07 DIAGNOSIS — O9902 Anemia complicating childbirth: Secondary | ICD-10-CM | POA: Diagnosis present

## 2018-07-07 LAB — CBC WITH DIFFERENTIAL/PLATELET
Basophils Absolute: 0 10*3/uL (ref 0.0–0.1)
Basophils Relative: 0 %
Eosinophils Absolute: 0.2 10*3/uL (ref 0.0–0.5)
Eosinophils Relative: 2 %
HCT: 34.4 % — ABNORMAL LOW (ref 36.0–46.0)
Hemoglobin: 11.4 g/dL — ABNORMAL LOW (ref 12.0–15.0)
Lymphocytes Relative: 33 %
Lymphs Abs: 2.3 10*3/uL (ref 0.7–4.0)
MCH: 29.4 pg (ref 26.0–34.0)
MCHC: 33.1 g/dL (ref 30.0–36.0)
MCV: 88.7 fL (ref 80.0–100.0)
Monocytes Absolute: 0.4 10*3/uL (ref 0.1–1.0)
Monocytes Relative: 5 %
Neutro Abs: 4.2 10*3/uL (ref 1.7–7.7)
Neutrophils Relative %: 60 %
Platelets: 183 10*3/uL (ref 150–400)
RBC: 3.88 MIL/uL (ref 3.87–5.11)
RDW: 15.1 % (ref 11.5–15.5)
WBC: 7.1 10*3/uL (ref 4.0–10.5)
nRBC: 0 % (ref 0.0–0.2)

## 2018-07-07 LAB — TYPE AND SCREEN
ABO/RH(D): O POS
Antibody Screen: NEGATIVE

## 2018-07-07 LAB — GLUCOSE, CAPILLARY
Glucose-Capillary: 197 mg/dL — ABNORMAL HIGH (ref 70–99)
Glucose-Capillary: 196 mg/dL — ABNORMAL HIGH (ref 70–99)
Glucose-Capillary: 212 mg/dL — ABNORMAL HIGH (ref 70–99)
Glucose-Capillary: 152 mg/dL — ABNORMAL HIGH (ref 70–99)

## 2018-07-07 MED ORDER — ASPIRIN 81 MG PO CHEW
81.0000 mg | CHEWABLE_TABLET | Freq: Every day | ORAL | Status: DC
  Administered 2018-07-07 – 2018-07-08 (×2): 81 mg via ORAL
  Filled 2018-07-07 (×4): qty 1

## 2018-07-07 MED ORDER — INSULIN NPH (HUMAN) (ISOPHANE) 100 UNIT/ML ~~LOC~~ SUSP
20.0000 [IU] | Freq: Every day | SUBCUTANEOUS | Status: DC
  Administered 2018-07-08: 20 [IU] via SUBCUTANEOUS
  Filled 2018-07-07: qty 10

## 2018-07-07 MED ORDER — INSULIN ASPART 100 UNIT/ML ~~LOC~~ SOLN
0.0000 [IU] | Freq: Three times a day (TID) | SUBCUTANEOUS | Status: DC
  Administered 2018-07-07: 2 [IU] via SUBCUTANEOUS
  Administered 2018-07-07: 4 [IU] via SUBCUTANEOUS
  Administered 2018-07-08: 3 [IU] via SUBCUTANEOUS
  Administered 2018-07-08: 2 [IU] via SUBCUTANEOUS
  Administered 2018-07-08: 3 [IU] via SUBCUTANEOUS

## 2018-07-07 MED ORDER — INSULIN ASPART 100 UNIT/ML ~~LOC~~ SOLN
6.0000 [IU] | Freq: Three times a day (TID) | SUBCUTANEOUS | Status: DC
  Administered 2018-07-07 – 2018-07-08 (×3): 6 [IU] via SUBCUTANEOUS

## 2018-07-07 MED ORDER — LACTATED RINGERS IV SOLN
INTRAVENOUS | Status: DC
  Administered 2018-07-07 (×2): via INTRAVENOUS

## 2018-07-07 MED ORDER — DOCUSATE SODIUM 100 MG PO CAPS
100.0000 mg | ORAL_CAPSULE | Freq: Every day | ORAL | Status: DC
  Administered 2018-07-07 – 2018-07-08 (×2): 100 mg via ORAL
  Filled 2018-07-07 (×2): qty 1

## 2018-07-07 MED ORDER — LACTATED RINGERS IV SOLN
500.0000 mL | INTRAVENOUS | Status: DC | PRN
  Administered 2018-07-07: 1000 mL via INTRAVENOUS

## 2018-07-07 MED ORDER — ACETAMINOPHEN 325 MG PO TABS
650.0000 mg | ORAL_TABLET | ORAL | Status: DC | PRN
  Administered 2018-07-07 – 2018-07-09 (×3): 650 mg via ORAL
  Filled 2018-07-07 (×3): qty 2

## 2018-07-07 MED ORDER — METFORMIN HCL 500 MG PO TABS
1000.0000 mg | ORAL_TABLET | Freq: Two times a day (BID) | ORAL | Status: DC
  Administered 2018-07-09 – 2018-07-10 (×2): 1000 mg via ORAL
  Filled 2018-07-07 (×7): qty 2

## 2018-07-07 MED ORDER — SODIUM CHLORIDE 0.9 % IV SOLN
Freq: Once | INTRAVENOUS | Status: AC
  Administered 2018-07-07: 05:00:00 via INTRAVENOUS

## 2018-07-07 MED ORDER — PRENATAL MULTIVITAMIN CH
1.0000 | ORAL_TABLET | Freq: Every day | ORAL | Status: DC
  Administered 2018-07-07 – 2018-07-08 (×2): 1 via ORAL
  Filled 2018-07-07 (×2): qty 1

## 2018-07-07 MED ORDER — CALCIUM CARBONATE ANTACID 500 MG PO CHEW
2.0000 | CHEWABLE_TABLET | ORAL | Status: DC | PRN
  Administered 2018-07-07: 400 mg via ORAL
  Filled 2018-07-07: qty 2

## 2018-07-07 MED ORDER — PANTOPRAZOLE SODIUM 40 MG PO TBEC
40.0000 mg | DELAYED_RELEASE_TABLET | Freq: Every day | ORAL | Status: DC
  Administered 2018-07-07 – 2018-07-08 (×2): 40 mg via ORAL
  Filled 2018-07-07 (×2): qty 1

## 2018-07-07 MED ORDER — BETAMETHASONE SOD PHOS & ACET 6 (3-3) MG/ML IJ SUSP
12.0000 mg | Freq: Once | INTRAMUSCULAR | Status: AC
  Administered 2018-07-07: 12 mg via INTRAMUSCULAR
  Filled 2018-07-07: qty 2

## 2018-07-07 MED ORDER — ZOLPIDEM TARTRATE 5 MG PO TABS: 5.0000 mg | ORAL_TABLET | Freq: Every evening | ORAL | Status: DC | PRN

## 2018-07-07 NOTE — Progress Notes (Addendum)
Inpatient Diabetes Program Recommendations   Diabetes Treatment Program Recommendations  ADA Standards of Care 2018 Diabetes in Pregnancy Target Glucose Ranges:  Fasting: 60 - 90 mg/dL Preprandial: 60 - 542 mg/dL 1 hr postprandial: Less than 140mg /dL (from first bite of meal) 2 hr postprandial: Less than 120 mg/dL (from first bite of meal)      Lab Results  Component Value Date   GLUCAP 197 (H) 07/07/2018   HGBA1C 7.2 (H) 07/01/2018    Review of Glycemic Control Results for SERI, TARPINIAN" (MRN [id number]) as of 07/07/2018 09:09  Ref. Range 07/06/2018 22:25 07/07/2018 08:14  Glucose-Capillary Latest Ref Range: 70 - 99 mg/dL 315 (H) 176 (H)   Diabetes history: Type 2 DM Outpatient Diabetes medications: Lantus 14 units QHS (per patient) Current orders for Inpatient glycemic control: Metformin 1000 mg BID  BMZ x 1 (07/07/2018)  Inpatient Diabetes Program Recommendations:    Noted AM glucose exceeding inpatient goals and anticipate trends to continue to increase in the setting of steroids.   Consider the following: - Adding NPH 20 units BID - Adding Novolog 6 units TID (assuming that patient is consuming >50% of meal) - Adding Novolog 0-14 units TID (after meals) under the diabetic pregnant patient order set.   Thanks, Lujean Rave, MSN, RNC-OB Diabetes Coordinator [phone] (8a-5p)

## 2018-07-07 NOTE — H&P (Signed)
Traci Peterson is a 33 y.o. female presenting from MAU for observation s/t Failed Antenatal Testing.  She had a NR NST and a BPP that was 6/8 with -2 for fetal movement.  Pregnancy and medical history significant for problems as listed below.  Patient has been non complaint with her insulin dosing.    Nursing Staff Provider  Office Location  [place] Dating  LMP c/w 9 week [place]  Language   English Anatomy US   Nml anatomy   Fetal echo nml  Flu Vaccine  03/03/18 Genetic Screen   Declines   TDaP vaccine   06/17/18 Hgb A1C or  GTT Early --GDM at least August 2019  HBA1C: 6.2  Rhogam  NA- O pos   LAB RESULTS   Feeding Plan  breast Blood Type --/--/O POS (08/27 0818)   Contraception  Antibody NEG (08/27 0818)  Circumcision  yes Rubella 3.02 (08/08 0753)  Pediatrician    RPR NON-REACTIVE (01/08 0901)   Support Person  Donald HBsAg NON-REACTIVE (01/08 0901)   Prenatal Classes  HIV Non Reactive (03/10 0929)  BTL Consent  n/a GBS    VBAC Consent  n/a Pap 8/19 normal    Hgb Electro   Declines    CF  Declines    SMA  Declines    Waterbirth  [ ]  Class [ ]  Consent [ ]  CNM visit   Patient Active Problem List   Diagnosis Date Noted  . Abnormal antenatal test 07/07/2018  . Insulin controlled gestational diabetes mellitus (GDM) during pregnancy 05/08/2018  . Heartburn in pregnancy 02/13/2018  . Trichomonal vaginitis during pregnancy 01/20/2018  . Supervision of high risk pregnancy, antepartum 01/01/2018  . Pre-existing type 2 diabetes mellitus during pregnancy, antepartum 12/16/2017  . Type 2 diabetes mellitus (HCC) 12/15/2017  . Vitamin D deficiency 12/10/2017  . MDD (major depressive disorder), recurrent episode, severe (HCC) 05/29/2017  . Incompetent cervix 09/12/2016  . Gall stones 07/19/2016  . Family history of breast cancer in female 06/06/2016  . Obesity in pregnancy 10/25/2014  . Eczema 10/25/2014  . 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 -- orange, yellow 6 and red food colorings - moderate to severe  respiratory distress 10/25/2014  . Chronic headaches -  no meds 10/25/2014  . Anxiety and depression 11/30/2013  . PCOS (polycystic ovarian syndrome) 04/20/2013     OB History    Gravida  3   Para  2   Term  0   Preterm  2   AB  0   Living  0     SAB  0   TAB  0   Ectopic  0   Multiple  1   Live Births  2          Past Medical History:  Diagnosis Date  . Anxiety    no current med.  . Cervix prolapsed into vagina   . Chronic headaches    otc med prn  . Complication of anesthesia    narrow airway and had difficulty breathing after cholecystectomy  . Constipation   . Depression    no meds currently  . Diabetes mellitus without complication (HCC)    type 2  . Gall stones    Resolved with surgery  . GERD (gastroesophageal reflux disease)   . Gestational diabetes   . HPV in female   . Jaw snapping    states jaw pops if opens mouth too wide  . Ovarian cyst   . PCOS (polycystic ovarian syndrome)   .  Pilonidal cyst 02/2013  . Preterm labor 2016   PPROM @ 18 wks, delivery of IUFD @ 21 wks  . Seasonal allergies   . SVD (spontaneous vaginal delivery)    x 2 - both fetal demises 2nd trim.  . Vaginal Pap smear, abnormal    Past Surgical History:  Procedure Laterality Date  . CERVICAL CERCLAGE N/A 01/21/2018   Procedure: CERCLAGE CERVICAL;  Surgeon: Lesly DukesLeggett, Kelly H, MD;  [facility];  Service: Gynecology;  Laterality: N/A;  . CHOLECYSTECTOMY N/A 07/21/2016   Procedure: LAPAROSCOPIC CHOLECYSTECTOMY;  Surgeon: Abigail Miyamotoouglas Blackman, MD;  Location: [facility];  Service: General;  Laterality: N/A;  . PILONIDAL CYST EXCISION N/A 03/12/2013   Procedure: CYST EXCISION PILONIDAL EXTENSIVE;  Surgeon: Shelly Rubensteinouglas A Blackman, MD;  Location: [facility];  Service: General;  Laterality: N/A;  . WISDOM TOOTH EXTRACTION     Family History: family history includes Alcohol abuse in her father; Breast cancer in her maternal aunt; Diabetes in her father and  mother; Hyperlipidemia in her mother; Hypertension in her father and mother; Pancreatic cancer in her paternal grandfather; Thyroid disease in her mother. Social History:  reports that she has been smoking cigarettes. She has a 3.00 pack-year smoking history. She has never used smokeless tobacco. She reports previous drug use. Drug: Hydrocodone. She reports that she does not drink alcohol.     Maternal Diabetes: Yes:  Diabetes Type:  Pre-pregnancy Genetic Screening: Normal Maternal Ultrasounds/Referrals: Normal Fetal Ultrasounds or other Referrals:  None Maternal Substance Abuse:  No Significant Maternal Medications:  Meds include: Other:  Significant Maternal Lab Results:  Lab values include: Other: Unknown GBS Other Comments:  None  Review of Systems  Gastrointestinal: Positive for nausea and vomiting.  Musculoskeletal: Positive for back pain.   Maternal Medical History:  Reason for admission: Nausea.   Fetal activity: Perceived fetal activity is normal.   Last perceived fetal movement was within the past hour.    Prenatal complications: History of IUFD x 2  Prenatal Complications - Diabetes: type 2. Diabetes is managed by insulin injections and oral agent (monotherapy).        Blood pressure (!) 94/45, pulse 92, temperature 98.5 F (36.9 C), temperature source Oral, resp. rate 19, height 5\' 2"  (1.575 m), last menstrual period 10/27/2017, SpO2 98 %. Maternal Exam:  Abdomen: Fundal height is LGA.       Physical Exam  Constitutional: She is oriented to person, place, and time. She appears well-developed.  HENT:  Head: Normocephalic and atraumatic.  Eyes: Conjunctivae are normal.  Neck: Normal range of motion.  Cardiovascular: Normal rate, regular rhythm and normal heart sounds.  Respiratory: Effort normal and breath sounds normal.  GI: Soft. Bowel sounds are normal. There is CVA tenderness.  Musculoskeletal: Normal range of motion.     Comments: Tenderness in Left  Flank.   Neurological: She is alert and oriented to person, place, and time.  Skin: Skin is warm and dry.  Psychiatric: She has a normal mood and affect. Her behavior is normal.    Prenatal labs: ABO, Rh: --/--/O POS (08/27 0818) Antibody: NEG (08/27 0818) Rubella: 3.02 (08/08 0753) RPR: NON-REACTIVE (08/08 0753)  HBsAg: NON-REACTIVE (08/08 0753)  HIV: NON-REACTIVE (08/08 0753)  GBS:     Assessment/Plan: [id number] at 36.1wks Failed Antenatal Testing DM-A2 Cerclage  Admit to Antepartum for observation per consult with Dr. Alysia PennaErvin Routine Antepartum Orders per Guidelines Give IV Fluids Continuous FM Betamethasone Now CBG TID after meals 1000mg  Metformin GBS  to be collected Plan to repeat BPP in 8 hours Diabetic Diet MD to manage care  Cherre RobinsJessica L Deaundra Kutzer MSN, CNM 07/07/2018, 1:48 AM

## 2018-07-07 NOTE — Progress Notes (Signed)
Initial visit with Traci Peterson and her family.  Traci Peterson reports she just found out she'll probably have to be here until she has her baby Traci Peterson, jr on Sunday.  She is currently having back pain and was hopeful it would be a shorter stay but her other medication spiked her blood sugar.  She is eager to meet her baby and grateful to have come this far after the loss of two pregnancies, a singleton in 2016 and twins.  Please page as further needs arise.  Traci Peterson. Carley Hammed, M.Div. [facility] Chaplain Pager [phone] Office [phone]

## 2018-07-07 NOTE — Progress Notes (Signed)
FACULTY PRACTICE ANTEPARTUM COMPREHENSIVE PROGRESS NOTE  Traci Peterson is a [age] y.o. G3P0200 at 8048w1d who is admitted for BPP 6/10 yesterday, uncontrolled Type II DM.  Estimated Date of Delivery: 08/03/18 Fetal presentation is cephalic.  Length of Stay:  Admitted 07/06/2018  Subjective: No complaints. Desires removal of cerclage today as this was planned to occur in office today. Patient reports good fetal movement.  She reports no uterine contractions, no bleeding and no loss of fluid per vagina.  Vitals:  Blood pressure (!) 104/55, pulse 96, temperature 98.3 F (36.8 C), resp. rate 18, height 5\' 2"  (1.575 m), last menstrual period 10/27/2017, SpO2 98 %. Physical Examination: CONSTITUTIONAL: Well-developed, well-nourished female in no acute distress.  HENT:  Normocephalic, atraumatic, External right and left ear normal. Oropharynx is clear and moist EYES: Conjunctivae and EOM are normal. Pupils are equal, round, and reactive to light. No scleral icterus.  NECK: Normal range of motion, supple, no masses SKIN: Skin is warm and dry. No rash noted. Not diaphoretic. No erythema. No pallor. NEUROLGIC: Alert and oriented to person, place, and time. Normal reflexes, muscle tone coordination. No cranial nerve deficit noted. PSYCHIATRIC: Normal mood and affect. Normal behavior. Normal judgment and thought content. CARDIOVASCULAR: Normal heart rate noted, regular rhythm RESPIRATORY: Effort and breath sounds normal, no problems with respiration noted MUSCULOSKELETAL: Normal range of motion. No edema and no tenderness. 2+ distal pulses. ABDOMEN: Soft, nondistended, gravid. CERVIX:  Deferred  Fetal monitoring: FHR: 150 bpm, Variability: moderate, Accelerations: Present, Decelerations: Absent  Uterine activity: No contractions   Results for orders placed or performed during the hospital encounter of 07/06/18 (from the past 48 hour(s))  Urinalysis, Routine w reflex microscopic     Status: Abnormal    Collection Time: 07/06/18 10:19 PM  Result Value Ref Range   Color, Urine YELLOW YELLOW   APPearance CLEAR CLEAR   Specific Gravity, Urine 1.025 1.005 - 1.030   pH 6.5 5.0 - 8.0   Glucose, UA NEGATIVE NEGATIVE mg/dL   Hgb urine dipstick MODERATE (A) NEGATIVE   Bilirubin Urine NEGATIVE NEGATIVE   Ketones, ur 15 (A) NEGATIVE mg/dL   Protein, ur 30 (A) NEGATIVE mg/dL   Nitrite NEGATIVE NEGATIVE   Leukocytes, UA SMALL (A) NEGATIVE    Comment: Performed at [facility], [address] [place], [place] [address]  Urinalysis, Microscopic (reflex)     Status: Abnormal   Collection Time: 07/06/18 10:19 PM  Result Value Ref Range   RBC / HPF 6-10 0 - 5 RBC/hpf   WBC, UA 0-5 0 - 5 WBC/hpf   Bacteria, UA RARE (A) NONE SEEN   Squamous Epithelial / LPF 0-5 0 - 5    Comment: Performed at [facility], [address] [place], [place] [address]  Glucose, capillary     Status: Abnormal   Collection Time: 07/06/18 10:25 PM  Result Value Ref Range   Glucose-Capillary 132 (H) 70 - 99 mg/dL  CBC with Differential/Platelet     Status: Abnormal   Collection Time: 07/07/18  2:21 AM  Result Value Ref Range   WBC 7.1 4.0 - 10.5 K/uL   RBC 3.88 3.87 - 5.11 MIL/uL   Hemoglobin 11.4 (L) 12.0 - 15.0 g/dL   HCT 13.234.4 (L) 44.036.0 - 10.246.0 %   MCV 88.7 80.0 - 100.0 fL   MCH 29.4 26.0 - 34.0 pg   MCHC 33.1 30.0 - 36.0 g/dL   RDW 72.515.1 36.611.5 - 44.015.5 %   Platelets 183 150 - 400 K/uL  nRBC 0.0 0.0 - 0.2 %   Neutrophils Relative % 60 %   Neutro Abs 4.2 1.7 - 7.7 K/uL   Lymphocytes Relative 33 %   Lymphs Abs 2.3 0.7 - 4.0 K/uL   Monocytes Relative 5 %   Monocytes Absolute 0.4 0.1 - 1.0 K/uL   Eosinophils Relative 2 %   Eosinophils Absolute 0.2 0.0 - 0.5 K/uL   Basophils Relative 0 %   Basophils Absolute 0.0 0.0 - 0.1 K/uL    Comment: Performed at [facility], [address], [place], [place] [address]  Type and screen [facility] [place]     Status: None   Collection Time: 07/07/18  2:21  AM  Result Value Ref Range   ABO/RH(D) O POS    Antibody Screen NEG    Sample Expiration      07/10/2018 Performed at [facility], [address], [place], [place] [address]   Glucose, capillary     Status: Abnormal   Collection Time: 07/07/18  8:14 AM  Result Value Ref Range   Glucose-Capillary 197 (H) 70 - 99 mg/dL  Glucose, capillary     Status: Abnormal   Collection Time: 07/07/18 11:12 AM  Result Value Ref Range   Glucose-Capillary 196 (H) 70 - 99 mg/dL    US Renal  Result Date: 07/07/2018 CLINICAL DATA:  Left-sided back pain, known pregnancy EXAM: RENAL / URINARY TRACT ULTRASOUND COMPLETE COMPARISON:  None. FINDINGS: Right Kidney: Renal measurements: 11.7 x 5.4 x 6.3 cm = volume: 207 mL . Echogenicity within normal limits. No mass or hydronephrosis visualized. Left Kidney: Renal measurements: 12.7 x 7.2 x 5.2 cm = volume: 246 mL. Echogenicity within normal limits. No mass or hydronephrosis visualized. Bladder: Decompressed IMPRESSION: No acute abnormality noted. Electronically Signed   By: Alcide Clever M.D.   On: 07/07/2018 01:29   [place] Mfm Fetal Bpp Wo Non Stress  Result Date: 07/07/2018 ----------------------------------------------------------------------  OBSTETRICS REPORT                       (Signed Final 07/07/2018 08:15 am) ---------------------------------------------------------------------- Patient Info  ID #:       [id number]                          D.O.B.:  18-Feb-1986 (32 yrs)  Name:       Traci Peterson               Visit Date: 07/07/2018 12:13 am ---------------------------------------------------------------------- Performed By  Performed By:     Eden Lathe BS      Ref. Address:     Faculty Practice                    RDMS RVT  Attending:        Noralee Space MD        Secondary Phy.:   MAU Nursing-                                                             MAU/Triage  Referred By:      Brand Males                [facility]                     [place] NP ---------------------------------------------------------------------- Orders   #  Description                          Code         Ordered By   1  US MFM FETAL BPP WO NON              76819.01     JESSICA EMLY      STRESS  ----------------------------------------------------------------------   #  Order #                    Accession #                 Episode #   1  [id number]                  [id number]  ---------------------------------------------------------------------- Indications   Non-reactive NST                               O28.9   Pre-existing diabetes, type 2, in pregnancy,   O24.113   third trimester (insulin, Lantus) (Echo, nml)   Poor obstetric history: Previous preterm       O09.219   delivery, antepartum (PPROM @ 21weeks   x2 singleton & multiples) (17p started @ 16w)   Obesity complicating pregnancy, third          O99.213   trimester   Cervical cerclage suture present, third        O34.33   trimester   [redacted] weeks gestation of pregnancy                Z3A.36  ---------------------------------------------------------------------- Vital Signs                                                 Height:        5'2" ---------------------------------------------------------------------- Fetal Evaluation  Num Of Fetuses:         1  Fetal Heart Rate(bpm):  158  Cardiac Activity:       Observed  Presentation:           Cephalic  Amniotic Fluid  AFI FV:      Within normal limits  AFI Sum(cm)     %Tile       Largest Pocket(cm)  14.64           54          5.77  RUQ(cm)       RLQ(cm)       LUQ(cm)        LLQ(cm)  1.43          5.77          1.91           5.53 ---------------------------------------------------------------------- Biophysical Evaluation  Amniotic F.V:   Within normal limits       F. Tone:        Observed  F. Movement:    Not Observed  Score:          6/8  F. Breathing:   Observed  ---------------------------------------------------------------------- OB History  Gravidity:    3         Term:   0        Prem:   2  Living:       0 ---------------------------------------------------------------------- Gestational Age  LMP:           36w 1d        Date:  10/27/17                 EDD:   08/03/18  Best:          36w 1d     Det. By:  Previous Ultrasound      EDD:   08/03/18                                      (12/09/17) ---------------------------------------------------------------------- Impression  Patient with type 2 diabetes presented to MAU with back  pain.  Amniotic fluid is normal and good fetal activity is seen. Fetal  breathing movements did not meet the criteria (BPP). BPP  6/8.  Patient was later admitted for management. ----------------------------------------------------------------------                  Noralee Space, MD Electronically Signed Final Report   07/07/2018 08:15 am ----------------------------------------------------------------------   Current scheduled medications . aspirin  81 mg Oral Daily  . docusate sodium  100 mg Oral Daily  . insulin aspart  0-14 Units Subcutaneous TID WC  . insulin aspart  6 Units Subcutaneous TID WC  . [START ON 07/08/2018] insulin NPH Human  20 Units Subcutaneous QAC breakfast  . metFORMIN  1,000 mg Oral BID WC  . pantoprazole  40 mg Oral Daily  . prenatal multivitamin  1 tablet Oral Q1200    I have reviewed the patient's current medications.  ASSESSMENT: Principal Problem:   Abnormal antenatal test Active Problems:   Incompetent cervix   Pre-existing type 2 diabetes mellitus during pregnancy, antepartum   Insulin controlled gestational diabetes mellitus (GDM) during pregnancy   PLAN: DM: Orders placed as per Diabetic Coordinator recommendations, appreciate their input. On NPH 20 bid, Novolog 6 tid and SSI.  FWB: BPP was 6/10 yesterday, currently 8/10 (no breathing).  Continue NST q shift, PTL: No signs/symptoms of  PTL. Continue contraction monitoring as needed. GBS, pelvic cultures done and pending. Incompetent cervix with cerclage in place: Patient was very vocal about having the removal today as it was scheduled to be removed by Dr. Penne Lash in the office. She was told that this was not an emergency, it can be done at any time this week and my recommendation was actually not to do it until her IOL at 37 weeks (07/13/18 as scheduled). She reiterated that this was her plan, she did not want to deviate from it given her high risk history. I tried to explain that this procedure was elective at this point, and there will be no adverse effects on fetus if it is kept in now, given lack of signs/symptoms of PTL or any other indication to remove it today.  Also pointed out that she is in an inpatient bed, and it would be more difficult to perform this procedure in this bed.  She became agitated syaing she wanted to talk to her physician Dr. Penne Lash.  I called Dr.  Penne Lash and put her on speaker phone; Dr. Penne Lash tried to reassure her that this was not necessary today, and reminded patient that she was hard to examine in office, and again there was no acute need to do this today.  Patient then got on a phone with a family friend, who is a Publishing copy at [employer], and was saying how she did not agree and felt she was being pressured not to do something she wanted. Her friend told her (she was on speaker phone) that her rights as a patient were not being heeded, and proceeded to ask her why we were not doing a cesarean section right now. I calmly said there was no indication for cesarean section, NST was reactive, BPP was 8/10 and there was no signs of maternal-fetal distress.  At this point, I told patient that I would agree to do a cerclage removal if it would defuse the stress of the situation, and told her RN to collect the necessary supplies to do in the room. I left the room at this point. The RN came to me a few minutes later  saying that she did not want me to do the procedure anymore, they asked if there was another physician available and were told that I was the physician covering the unit.  I went back to them, and she told me she did not want this done anymore, "I will call my doctor and figure it out". I informed her that Dr. Penne Lash was not in the [place] office anymore this week, but her partner Dr. Marice Potter would be available on Thursday.  I told the patient the plan was to make sure her fetal testing continues to be reassuring and her BS get under better control; she may be able to go home tomorrow and return for scheduled IOL on 07/13/18.  Of note, patient was told I am the attending on call on 07/13/18; she verbalized that she was comfortable with me being the doctor in charge that day.  Continue routine antenatal care.   Jaynie Collins, MD, FACOG Obstetrician & Gynecologist, [facility] for [employer], [facility]

## 2018-07-07 NOTE — Progress Notes (Signed)
Nutrition :diet education  33 yo, G3P0200 at 36.1wks adm with failed Antenatal Testing DM-A2 Cerclage  Pt has had diet education on GDM diet on 12/04/17 and 05/08/18. She reports understanding GDM education. She admits to leaving education handouts in the car. She reports excessive consumption of ice. Consumes 1-2 very large meals per day, with  no adherence to GDM diet guidelines. She understands this is not optimal for a positive pregnancy outcome.  She states she had better adherence to DM diet guidelines prior to pregnancy. Since it is likely she will remain in house until delivery, I made sure she understood the DM diet guidelines for after pregnancy. She was able to relay these guidelines to me.  Elisabeth Cara M.Odis Luster LDN Neonatal Nutrition Support Specialist/RD III Pager [phone]      Phone [phone]

## 2018-07-07 NOTE — Progress Notes (Signed)
Dr. Alysia Penna notified of patient reporting that she does not take metformin and that she takes "14 units of insulin at night" only. Current blood sugar 197. No new orders given. Dr. Alysia Penna to see patient. Carmelina Dane, RN

## 2018-07-08 ENCOUNTER — Ambulatory Visit (HOSPITAL_COMMUNITY): Admission: RE | Admit: 2018-07-08 | Payer: Medicaid Other | Source: Ambulatory Visit

## 2018-07-08 ENCOUNTER — Encounter (HOSPITAL_COMMUNITY): Payer: Self-pay

## 2018-07-08 LAB — GLUCOSE, CAPILLARY
Glucose-Capillary: 175 mg/dL — ABNORMAL HIGH (ref 70–99)
Glucose-Capillary: 164 mg/dL — ABNORMAL HIGH (ref 70–99)
Glucose-Capillary: 194 mg/dL — ABNORMAL HIGH (ref 70–99)
Glucose-Capillary: 152 mg/dL — ABNORMAL HIGH (ref 70–99)

## 2018-07-08 LAB — GC/CHLAMYDIA PROBE AMP (~~LOC~~) NOT AT ARMC
Chlamydia: NEGATIVE
Neisseria Gonorrhea: NEGATIVE

## 2018-07-08 MED ORDER — INSULIN ASPART 100 UNIT/ML ~~LOC~~ SOLN
8.0000 [IU] | Freq: Three times a day (TID) | SUBCUTANEOUS | Status: DC
  Administered 2018-07-08 (×2): 8 [IU] via SUBCUTANEOUS

## 2018-07-08 MED ORDER — INSULIN NPH (HUMAN) (ISOPHANE) 100 UNIT/ML ~~LOC~~ SUSP
20.0000 [IU] | Freq: Two times a day (BID) | SUBCUTANEOUS | Status: DC
  Administered 2018-07-08: 20 [IU] via SUBCUTANEOUS
  Filled 2018-07-08: qty 10

## 2018-07-08 NOTE — Progress Notes (Signed)
Inpatient Diabetes Program Recommendations  AACE/ADA: New Consensus Statement on Inpatient Glycemic Control (2015)  Target Ranges:  Prepandial:   less than 140 mg/dL      Peak postprandial:   less than 180 mg/dL (1-2 hours)      Critically ill patients:  140 - 180 mg/dL   Lab Results  Component Value Date   GLUCAP 152 (H) 07/07/2018   HGBA1C 7.2 (H) 07/01/2018    Review of Glycemic Control Results for Traci Peterson, Traci "[name]" (MRN [id number]) as of 07/08/2018 09:22  Ref. Range 07/07/2018 08:14 07/07/2018 11:12 07/07/2018 14:55 07/07/2018 19:32  Glucose-Capillary Latest Ref Range: 70 - 99 mg/dL 409197 (H) 811196 (H) 914212 (H) 152 (H)   Diabetes history: Type 2 DM Outpatient Diabetes medications: Lantus 14 units QHS (per patient) Current orders for Inpatient glycemic control: Metformin 1000 mg BID, Novolog 0-14 units TID, NPH 20 units QAM, Novolog 8 units TID  BMZ x 1 (07/07/2018)  Inpatient Diabetes Program Recommendations:    Noted that patient just received first dose of NPH, thus yesterday's trends were still elevated. Dose is scheduled for QAM.  Consider the following: - Adding NPH 20 units BID - In agreement with increasing to Novolog 8 units TID (assuming that patient is consuming >50% of meal). Orders have been updated per Dr Macon LargeAnyanwu Thanks, Lujean RaveLauren Alonzo Loving, MSN, RNC-OB Diabetes Coordinator [phone] (8a-5p)

## 2018-07-08 NOTE — Progress Notes (Addendum)
FACULTY PRACTICE ANTEPARTUM COMPREHENSIVE PROGRESS NOTE  Traci Peterson is a 33 y.o. G3P0200 at [redacted]w[redacted]d who is admitted for BPP 6/10 yesterday, uncontrolled Type II DM.  Estimated Date of Delivery: 08/03/18 Fetal presentation is cephalic.  Length of Stay:  Admitted 07/06/2018  Subjective: No complaints. Patient reports good fetal movement.  She reports no uterine contractions, no bleeding and no loss of fluid per vagina.  Vitals:  Blood pressure (!) 111/52, pulse (!) 104, temperature 97.9 F (36.6 C), temperature source Oral, resp. rate 18, height 5\' 2"  (1.575 m), weight 131.5 kg, last menstrual period 10/27/2017, SpO2 100 %. Physical Examination: CONSTITUTIONAL: Well-developed, well-nourished female in no acute distress.  HENT:  Normocephalic, atraumatic, External right and left ear normal. Oropharynx is clear and moist EYES: Conjunctivae and EOM are normal. Pupils are equal, round, and reactive to light. No scleral icterus.  NECK: Normal range of motion, supple, no masses SKIN: Skin is warm and dry. No rash noted. Not diaphoretic. No erythema. No pallor. NEUROLGIC: Alert and oriented to person, place, and time. Normal reflexes, muscle tone coordination. No cranial nerve deficit noted. PSYCHIATRIC: Normal mood and affect. Normal behavior. Normal judgment and thought content. CARDIOVASCULAR: Normal heart rate noted, regular rhythm RESPIRATORY: Effort and breath sounds normal, no problems with respiration noted MUSCULOSKELETAL: Normal range of motion. No edema and no tenderness. 2+ distal pulses. ABDOMEN: Soft, nondistended, gravid. CERVIX:  Deferred  Fetal monitoring: FHR: 150 bpm, Variability: moderate, Accelerations: Present, Decelerations: Absent  Uterine activity: No contractions   Results for orders placed or performed during the hospital encounter of 07/06/18 (from the past 48 hour(s))  Urinalysis, Routine w reflex microscopic     Status: Abnormal   Collection Time: 07/06/18 10:19  PM  Result Value Ref Range   Color, Urine YELLOW YELLOW   APPearance CLEAR CLEAR   Specific Gravity, Urine 1.025 1.005 - 1.030   pH 6.5 5.0 - 8.0   Glucose, UA NEGATIVE NEGATIVE mg/dL   Hgb urine dipstick MODERATE (A) NEGATIVE   Bilirubin Urine NEGATIVE NEGATIVE   Ketones, ur 15 (A) NEGATIVE mg/dL   Protein, ur 30 (A) NEGATIVE mg/dL   Nitrite NEGATIVE NEGATIVE   Leukocytes, UA SMALL (A) NEGATIVE    Comment: Performed at [facility], [address] [place], [place] [address]  Urinalysis, Microscopic (reflex)     Status: Abnormal   Collection Time: 07/06/18 10:19 PM  Result Value Ref Range   RBC / HPF 6-10 0 - 5 RBC/hpf   WBC, UA 0-5 0 - 5 WBC/hpf   Bacteria, UA RARE (A) NONE SEEN   Squamous Epithelial / LPF 0-5 0 - 5    Comment: Performed at [facility] [address] [place], [place] [address]  Glucose, capillary     Status: Abnormal   Collection Time: 07/06/18 10:25 PM  Result Value Ref Range   Glucose-Capillary 132 (H) 70 - 99 mg/dL  CBC with Differential/Platelet     Status: Abnormal   Collection Time: 07/07/18  2:21 AM  Result Value Ref Range   WBC 7.1 4.0 - 10.5 K/uL   RBC 3.88 3.87 - 5.11 MIL/uL   Hemoglobin 11.4 (L) 12.0 - 15.0 g/dL   HCT 21.1 (L) 15.5 - 20.8 %   MCV 88.7 80.0 - 100.0 fL   MCH 29.4 26.0 - 34.0 pg   MCHC 33.1 30.0 - 36.0 g/dL   RDW 02.2 33.6 - 12.2 %   Platelets 183 150 - 400 K/uL   nRBC 0.0 0.0 - 0.2 %  Neutrophils Relative % 60 %   Neutro Abs 4.2 1.7 - 7.7 K/uL   Lymphocytes Relative 33 %   Lymphs Abs 2.3 0.7 - 4.0 K/uL   Monocytes Relative 5 %   Monocytes Absolute 0.4 0.1 - 1.0 K/uL   Eosinophils Relative 2 %   Eosinophils Absolute 0.2 0.0 - 0.5 K/uL   Basophils Relative 0 %   Basophils Absolute 0.0 0.0 - 0.1 K/uL    Comment: Performed at [facility], [address] [place], [place] [address]  Type and screen [facility]     Status: None   Collection Time: 07/07/18  2:21 AM  Result Value Ref Range    ABO/RH(D) O POS    Antibody Screen NEG    Sample Expiration      07/10/2018 Performed at [facility], [address] [place], [place] [address]   Glucose, capillary     Status: Abnormal   Collection Time: 07/07/18  8:14 AM  Result Value Ref Range   Glucose-Capillary 197 (H) 70 - 99 mg/dL  Glucose, capillary     Status: Abnormal   Collection Time: 07/07/18 11:12 AM  Result Value Ref Range   Glucose-Capillary 196 (H) 70 - 99 mg/dL  Glucose, capillary     Status: Abnormal   Collection Time: 07/07/18  2:55 PM  Result Value Ref Range   Glucose-Capillary 212 (H) 70 - 99 mg/dL  Glucose, capillary     Status: Abnormal   Collection Time: 07/07/18  7:32 PM  Result Value Ref Range   Glucose-Capillary 152 (H) 70 - 99 mg/dL  Glucose, capillary     Status: Abnormal   Collection Time: 07/08/18  9:35 AM  Result Value Ref Range   Glucose-Capillary 175 (H) 70 - 99 mg/dL    Imaging: Koreas Renal  Result Date: 07/07/2018 CLINICAL DATA:  Left-sided back pain, known pregnancy EXAM: RENAL / URINARY TRACT ULTRASOUND COMPLETE COMPARISON:  None. FINDINGS: Right Kidney: Renal measurements: 11.7 x 5.4 x 6.3 cm = volume: 207 mL . Echogenicity within normal limits. No mass or hydronephrosis visualized. Left Kidney: Renal measurements: 12.7 x 7.2 x 5.2 cm = volume: 246 mL. Echogenicity within normal limits. No mass or hydronephrosis visualized. Bladder: Decompressed IMPRESSION: No acute abnormality noted. Electronically Signed   By: Alcide CleverMark  Lukens M.D.   On: 07/07/2018 01:29   [place]oreas Mfm Fetal Bpp Wo Non Stress  Result Date: 07/07/2018 ----------------------------------------------------------------------  OBSTETRICS REPORT                       (Signed Final 07/07/2018 08:15 am) ---------------------------------------------------------------------- Patient Info  ID #:       [id number]                          D.O.B.:  02-Oct-1985 (32 yrs)  Name:       Traci Peterson               Visit Date: 07/07/2018 12:13 am  ---------------------------------------------------------------------- Performed By  Performed By:     Eden Lathearrie Stalter BS      Ref. Address:     Faculty Practice                    RDMS RVT  Attending:        Noralee Spaceavi Shankar MD        Secondary Phy.:   MAU Nursing-  MAU/Triage  Referred By:      Brand Males                Location:         [facility] NP ---------------------------------------------------------------------- Orders   #  Description                          Code         Ordered By   1  [place] MFM FETAL BPP WO NON              76819.01     JESSICA EMLY      STRESS  ----------------------------------------------------------------------   #  Order #                    Accession #                 Episode #   1  [id number]                  [id number]                  [id number]  ---------------------------------------------------------------------- Indications   Non-reactive NST                               O28.9   Pre-existing diabetes, type 2, in pregnancy,   O24.113   third trimester (insulin, Lantus) (Echo, nml)   Poor obstetric history: Previous preterm       O09.219   delivery, antepartum (PPROM @ 21weeks   x2 singleton & multiples) (17p started @ 16w)   Obesity complicating pregnancy, third          O99.213   trimester   Cervical cerclage suture present, third        O34.33   trimester   [redacted] weeks gestation of pregnancy                Z3A.36  ---------------------------------------------------------------------- Vital Signs                                                 Height:        5'2" ---------------------------------------------------------------------- Fetal Evaluation  Num Of Fetuses:         1  Fetal Heart Rate(bpm):  158  Cardiac Activity:       Observed  Presentation:           Cephalic  Amniotic Fluid  AFI FV:      Within normal limits  AFI Sum(cm)     %Tile       Largest Pocket(cm)  14.64           54           5.77  RUQ(cm)       RLQ(cm)       LUQ(cm)        LLQ(cm)  1.43          5.77          1.91           5.53 ---------------------------------------------------------------------- Biophysical Evaluation  Amniotic F.V:   Within normal  limits       F. Tone:        Observed  F. Movement:    Not Observed               Score:          6/8  F. Breathing:   Observed ---------------------------------------------------------------------- OB History  Gravidity:    3         Term:   0        Prem:   2  Living:       0 ---------------------------------------------------------------------- Gestational Age  LMP:           36w 1d        Date:  10/27/17                 EDD:   08/03/18  Best:          36w 1d     Det. By:  Previous Ultrasound      EDD:   08/03/18                                      (12/09/17) ---------------------------------------------------------------------- Impression  Patient with type 2 diabetes presented to MAU with back  pain.  Amniotic fluid is normal and good fetal activity is seen. Fetal  breathing movements did not meet the criteria (BPP). BPP  6/8.  Patient was later admitted for management. ----------------------------------------------------------------------                  Noralee Spaceavi Shankar, MD Electronically Signed Final Report   07/07/2018 08:15 am ----------------------------------------------------------------------   Current scheduled medications . aspirin  81 mg Oral Daily  . docusate sodium  100 mg Oral Daily  . insulin aspart  0-14 Units Subcutaneous TID WC  . insulin aspart  8 Units Subcutaneous TID WC  . insulin NPH Human  20 Units Subcutaneous QAC breakfast  . metFORMIN  1,000 mg Oral BID WC  . pantoprazole  40 mg Oral Daily  . prenatal multivitamin  1 tablet Oral Q1200  I have reviewed the patient's current medications.   ASSESSMENT: Principal Problem:   Abnormal antenatal test Active Problems:   Incompetent cervix   Pre-existing type 2 diabetes mellitus during pregnancy,  antepartum   Insulin controlled gestational diabetes mellitus (GDM) during pregnancy   PLAN: DM: Still having elevated CBGs. Orders modified as per Diabetic Coordinator recommendations, appreciate their input. On NPH 20 bid, Novolog 8 tid and SSI. Continue close monitoring. Given that her DM is still uncontrolled, will remain inpatient until delivery. FWB: Reactive NST.  Continue NST q shift PTL: No signs/symptoms of PTL. Continue contraction monitoring as needed. GBS, pelvic cultures done and pending. Incompetent cervix with cerclage in place: To be removed on L&D prior to initiation of IOL on 2/16 or earlier if indicated.  Continue routine antenatal care.   Jaynie CollinsUGONNA  Amarah Brossman, MD, FACOG Obstetrician & Gynecologist, [facility], [facility]

## 2018-07-09 ENCOUNTER — Encounter (HOSPITAL_COMMUNITY)
Admission: AD | Disposition: A | Discharge status: Home / Self Care | Payer: Self-pay | Source: Home / Self Care | Attending: Obstetrics and Gynecology

## 2018-07-09 ENCOUNTER — Encounter (HOSPITAL_COMMUNITY): Payer: Self-pay | Admitting: Anesthesiology

## 2018-07-09 ENCOUNTER — Inpatient Hospital Stay (HOSPITAL_COMMUNITY): Payer: Medicaid Other | Admitting: Anesthesiology

## 2018-07-09 ENCOUNTER — Inpatient Hospital Stay (HOSPITAL_COMMUNITY): Payer: Medicaid Other

## 2018-07-09 DIAGNOSIS — O24113 Pre-existing diabetes mellitus, type 2, in pregnancy, third trimester: Secondary | ICD-10-CM

## 2018-07-09 DIAGNOSIS — Z3A36 36 weeks gestation of pregnancy: Secondary | ICD-10-CM

## 2018-07-09 DIAGNOSIS — O2412 Pre-existing diabetes mellitus, type 2, in childbirth: Secondary | ICD-10-CM

## 2018-07-09 DIAGNOSIS — O3433 Maternal care for cervical incompetence, third trimester: Secondary | ICD-10-CM

## 2018-07-09 DIAGNOSIS — O09213 Supervision of pregnancy with history of pre-term labor, third trimester: Secondary | ICD-10-CM

## 2018-07-09 DIAGNOSIS — O99213 Obesity complicating pregnancy, third trimester: Secondary | ICD-10-CM

## 2018-07-09 HISTORY — PX: CERVICAL CERCLAGE: SHX1329

## 2018-07-09 LAB — CULTURE, BETA STREP (GROUP B ONLY)

## 2018-07-09 LAB — GLUCOSE, CAPILLARY
Glucose-Capillary: 118 mg/dL — ABNORMAL HIGH (ref 70–99)
Glucose-Capillary: 90 mg/dL (ref 70–99)
Glucose-Capillary: 136 mg/dL — ABNORMAL HIGH (ref 70–99)
Glucose-Capillary: 116 mg/dL — ABNORMAL HIGH (ref 70–99)

## 2018-07-09 SURGERY — Surgical Case
Anesthesia: Spinal | Site: Cervix | Wound class: Clean Contaminated

## 2018-07-09 MED ORDER — GABAPENTIN 300 MG PO CAPS
300.0000 mg | ORAL_CAPSULE | Freq: Two times a day (BID) | ORAL | Status: DC
  Administered 2018-07-09 – 2018-07-10 (×2): 300 mg via ORAL
  Filled 2018-07-09 (×2): qty 1

## 2018-07-09 MED ORDER — MAGNESIUM HYDROXIDE 400 MG/5ML PO SUSP: 30.0000 mL | ORAL | Status: DC | PRN

## 2018-07-09 MED ORDER — FERROUS SULFATE 325 (65 FE) MG PO TABS
325.0000 mg | ORAL_TABLET | Freq: Two times a day (BID) | ORAL | Status: DC
  Administered 2018-07-09 – 2018-07-10 (×2): 325 mg via ORAL
  Filled 2018-07-09 (×2): qty 1

## 2018-07-09 MED ORDER — NALBUPHINE HCL 10 MG/ML IJ SOLN: 5.0000 mg | INTRAMUSCULAR | Status: DC | PRN

## 2018-07-09 MED ORDER — MORPHINE SULFATE (PF) 0.5 MG/ML IJ SOLN
INTRAMUSCULAR | Status: DC | PRN
  Administered 2018-07-09: .15 mg via INTRATHECAL

## 2018-07-09 MED ORDER — KETOROLAC TROMETHAMINE 30 MG/ML IJ SOLN
30.0000 mg | Freq: Four times a day (QID) | INTRAMUSCULAR | Status: DC
  Administered 2018-07-09 – 2018-07-10 (×3): 30 mg via INTRAVENOUS
  Filled 2018-07-09 (×3): qty 1

## 2018-07-09 MED ORDER — HYDROMORPHONE HCL 1 MG/ML IJ SOLN
1.0000 mg | INTRAMUSCULAR | Status: DC | PRN
  Administered 2018-07-09: 1 mg via INTRAVENOUS
  Filled 2018-07-09: qty 1

## 2018-07-09 MED ORDER — WITCH HAZEL-GLYCERIN EX PADS: 1.0000 "application " | MEDICATED_PAD | CUTANEOUS | Status: DC | PRN

## 2018-07-09 MED ORDER — NALOXONE HCL 0.4 MG/ML IJ SOLN: 0.4000 mg | INTRAMUSCULAR | Status: DC | PRN

## 2018-07-09 MED ORDER — OXYCODONE-ACETAMINOPHEN 5-325 MG PO TABS
2.0000 | ORAL_TABLET | ORAL | Status: DC | PRN
  Administered 2018-07-09 – 2018-07-10 (×2): 2 via ORAL
  Filled 2018-07-09 (×2): qty 2

## 2018-07-09 MED ORDER — KETOROLAC TROMETHAMINE 30 MG/ML IJ SOLN: 30.0000 mg | Freq: Four times a day (QID) | INTRAMUSCULAR | Status: AC | PRN

## 2018-07-09 MED ORDER — SOD CITRATE-CITRIC ACID 500-334 MG/5ML PO SOLN
30.0000 mL | Freq: Once | ORAL | Status: DC
  Filled 2018-07-09: qty 30

## 2018-07-09 MED ORDER — OXYTOCIN 10 UNIT/ML IJ SOLN
INTRAMUSCULAR | Status: AC
  Filled 2018-07-09: qty 4

## 2018-07-09 MED ORDER — DEXTROSE 5 % IV SOLN
INTRAVENOUS | Status: AC
  Filled 2018-07-09: qty 3000

## 2018-07-09 MED ORDER — OXYCODONE-ACETAMINOPHEN 5-325 MG PO TABS: 1.0000 | ORAL_TABLET | ORAL | Status: DC | PRN

## 2018-07-09 MED ORDER — SIMETHICONE 80 MG PO CHEW: 80.0000 mg | CHEWABLE_TABLET | ORAL | Status: DC | PRN

## 2018-07-09 MED ORDER — DIBUCAINE 1 % RE OINT: 1.0000 "application " | TOPICAL_OINTMENT | RECTAL | Status: DC | PRN

## 2018-07-09 MED ORDER — PHENYLEPHRINE 8 MG IN D5W 100 ML (0.08MG/ML) PREMIX OPTIME
INJECTION | INTRAVENOUS | Status: AC
  Filled 2018-07-09: qty 100

## 2018-07-09 MED ORDER — PRENATAL MULTIVITAMIN CH: 1.0000 | ORAL_TABLET | Freq: Every day | ORAL | Status: DC

## 2018-07-09 MED ORDER — NALBUPHINE HCL 10 MG/ML IJ SOLN: 5.0000 mg | Freq: Once | INTRAMUSCULAR | Status: DC | PRN

## 2018-07-09 MED ORDER — MENTHOL 3 MG MT LOZG: 1.0000 | LOZENGE | OROMUCOSAL | Status: DC | PRN

## 2018-07-09 MED ORDER — INSULIN ASPART 100 UNIT/ML ~~LOC~~ SOLN
0.0000 [IU] | Freq: Three times a day (TID) | SUBCUTANEOUS | Status: DC
  Administered 2018-07-09: 2 [IU] via SUBCUTANEOUS
  Administered 2018-07-09: 1 [IU] via SUBCUTANEOUS

## 2018-07-09 MED ORDER — DIPHENHYDRAMINE HCL 50 MG/ML IJ SOLN
12.5000 mg | INTRAMUSCULAR | Status: DC | PRN
  Administered 2018-07-09: 12.5 mg via INTRAVENOUS

## 2018-07-09 MED ORDER — TRAMADOL HCL 50 MG PO TABS
50.0000 mg | ORAL_TABLET | Freq: Four times a day (QID) | ORAL | Status: DC | PRN
  Administered 2018-07-09: 50 mg via ORAL
  Filled 2018-07-09: qty 1

## 2018-07-09 MED ORDER — LACTATED RINGERS IV SOLN
INTRAVENOUS | Status: DC
  Administered 2018-07-09: 17:00:00 via INTRAVENOUS

## 2018-07-09 MED ORDER — ZOLPIDEM TARTRATE 5 MG PO TABS: 5.0000 mg | ORAL_TABLET | Freq: Every evening | ORAL | Status: DC | PRN

## 2018-07-09 MED ORDER — OXYTOCIN 40 UNITS IN NORMAL SALINE INFUSION - SIMPLE MED: 2.5000 [IU]/h | INTRAVENOUS | Status: AC

## 2018-07-09 MED ORDER — OXYTOCIN 10 UNIT/ML IJ SOLN
INTRAVENOUS | Status: DC | PRN
  Administered 2018-07-09: 40 [IU] via INTRAVENOUS

## 2018-07-09 MED ORDER — NALOXONE HCL 4 MG/10ML IJ SOLN
1.0000 ug/kg/h | INTRAVENOUS | Status: DC | PRN
  Filled 2018-07-09: qty 5

## 2018-07-09 MED ORDER — SOD CITRATE-CITRIC ACID 500-334 MG/5ML PO SOLN
ORAL | Status: AC
  Filled 2018-07-09: qty 15

## 2018-07-09 MED ORDER — SODIUM CHLORIDE 0.9% FLUSH: 3.0000 mL | INTRAVENOUS | Status: DC | PRN

## 2018-07-09 MED ORDER — COCONUT OIL OIL
1.0000 "application " | TOPICAL_OIL | Status: DC | PRN
  Administered 2018-07-09: 1 via TOPICAL
  Filled 2018-07-09: qty 120

## 2018-07-09 MED ORDER — FENTANYL CITRATE (PF) 100 MCG/2ML IJ SOLN
INTRAMUSCULAR | Status: AC
  Filled 2018-07-09: qty 2

## 2018-07-09 MED ORDER — ONDANSETRON HCL 4 MG/2ML IJ SOLN: 4.0000 mg | Freq: Three times a day (TID) | INTRAMUSCULAR | Status: DC | PRN

## 2018-07-09 MED ORDER — SENNOSIDES-DOCUSATE SODIUM 8.6-50 MG PO TABS
2.0000 | ORAL_TABLET | ORAL | Status: DC
  Administered 2018-07-09: 2 via ORAL
  Filled 2018-07-09: qty 2

## 2018-07-09 MED ORDER — SIMETHICONE 80 MG PO CHEW
80.0000 mg | CHEWABLE_TABLET | ORAL | Status: DC
  Administered 2018-07-09: 80 mg via ORAL
  Filled 2018-07-09: qty 1

## 2018-07-09 MED ORDER — FENTANYL CITRATE (PF) 100 MCG/2ML IJ SOLN
INTRAMUSCULAR | Status: DC | PRN
  Administered 2018-07-09: 15 ug via INTRATHECAL

## 2018-07-09 MED ORDER — MORPHINE SULFATE (PF) 0.5 MG/ML IJ SOLN
INTRAMUSCULAR | Status: AC
  Filled 2018-07-09: qty 10

## 2018-07-09 MED ORDER — KETOROLAC TROMETHAMINE 30 MG/ML IJ SOLN
INTRAMUSCULAR | Status: AC
  Filled 2018-07-09: qty 1

## 2018-07-09 MED ORDER — BUPIVACAINE IN DEXTROSE 0.75-8.25 % IT SOLN
INTRATHECAL | Status: DC | PRN
  Administered 2018-07-09: 1.6 mL via INTRATHECAL

## 2018-07-09 MED ORDER — DIPHENHYDRAMINE HCL 25 MG PO CAPS: 25.0000 mg | ORAL_CAPSULE | Freq: Four times a day (QID) | ORAL | Status: DC | PRN

## 2018-07-09 MED ORDER — INSULIN NPH (HUMAN) (ISOPHANE) 100 UNIT/ML ~~LOC~~ SUSP
15.0000 [IU] | Freq: Two times a day (BID) | SUBCUTANEOUS | Status: DC
  Administered 2018-07-09 – 2018-07-10 (×2): 15 [IU] via SUBCUTANEOUS

## 2018-07-09 MED ORDER — IBUPROFEN 800 MG PO TABS: 800.0000 mg | ORAL_TABLET | Freq: Four times a day (QID) | ORAL | Status: DC

## 2018-07-09 MED ORDER — LACTATED RINGERS IV SOLN
INTRAVENOUS | Status: DC | PRN
  Administered 2018-07-09 (×2): via INTRAVENOUS

## 2018-07-09 MED ORDER — MEPERIDINE HCL 25 MG/ML IJ SOLN: 6.2500 mg | INTRAMUSCULAR | Status: DC | PRN

## 2018-07-09 MED ORDER — DEXTROSE 5 % IV SOLN
3.0000 g | INTRAVENOUS | Status: AC
  Administered 2018-07-09: 3 g via INTRAVENOUS

## 2018-07-09 MED ORDER — PHENYLEPHRINE 8 MG IN D5W 100 ML (0.08MG/ML) PREMIX OPTIME
INJECTION | INTRAVENOUS | Status: DC | PRN
  Administered 2018-07-09: 60 ug/min via INTRAVENOUS

## 2018-07-09 MED ORDER — INSULIN ASPART 100 UNIT/ML ~~LOC~~ SOLN
5.0000 [IU] | Freq: Three times a day (TID) | SUBCUTANEOUS | Status: DC
  Administered 2018-07-09 – 2018-07-10 (×2): 5 [IU] via SUBCUTANEOUS

## 2018-07-09 MED ORDER — TETANUS-DIPHTH-ACELL PERTUSSIS 5-2.5-18.5 LF-MCG/0.5 IM SUSP: 0.5000 mL | Freq: Once | INTRAMUSCULAR | Status: DC

## 2018-07-09 MED ORDER — MEASLES, MUMPS & RUBELLA VAC IJ SOLR: 0.5000 mL | Freq: Once | INTRAMUSCULAR | Status: DC

## 2018-07-09 MED ORDER — KETOROLAC TROMETHAMINE 30 MG/ML IJ SOLN
30.0000 mg | Freq: Four times a day (QID) | INTRAMUSCULAR | Status: AC | PRN
  Administered 2018-07-09: 30 mg via INTRAMUSCULAR

## 2018-07-09 MED ORDER — FENTANYL CITRATE (PF) 100 MCG/2ML IJ SOLN
50.0000 ug | INTRAMUSCULAR | Status: DC | PRN
  Administered 2018-07-09 (×2): 50 ug via INTRAVENOUS

## 2018-07-09 MED ORDER — DIPHENHYDRAMINE HCL 50 MG/ML IJ SOLN
INTRAMUSCULAR | Status: AC
  Filled 2018-07-09: qty 1

## 2018-07-09 MED ORDER — ONDANSETRON HCL 4 MG/2ML IJ SOLN
INTRAMUSCULAR | Status: AC
  Filled 2018-07-09: qty 2

## 2018-07-09 MED ORDER — ENOXAPARIN SODIUM 80 MG/0.8ML ~~LOC~~ SOLN
0.5000 mg/kg | SUBCUTANEOUS | Status: DC
  Filled 2018-07-09: qty 0.8

## 2018-07-09 MED ORDER — DIPHENHYDRAMINE HCL 25 MG PO CAPS
25.0000 mg | ORAL_CAPSULE | ORAL | Status: DC | PRN
  Filled 2018-07-09: qty 1

## 2018-07-09 MED ORDER — ONDANSETRON HCL 4 MG/2ML IJ SOLN
INTRAMUSCULAR | Status: DC | PRN
  Administered 2018-07-09: 4 mg via INTRAVENOUS

## 2018-07-09 SURGICAL SUPPLY — 32 items
CHLORAPREP W/TINT 26ML (MISCELLANEOUS) ×3 IMPLANT
CLAMP CORD UMBIL (MISCELLANEOUS) IMPLANT
CLOTH BEACON ORANGE TIMEOUT ST (SAFETY) ×3 IMPLANT
DRESSING PREVENA PLUS CUSTOM (GAUZE/BANDAGES/DRESSINGS) ×2 IMPLANT
DRSG OPSITE POSTOP 4X10 (GAUZE/BANDAGES/DRESSINGS) ×3 IMPLANT
DRSG PREVENA PLUS CUSTOM (GAUZE/BANDAGES/DRESSINGS) ×3
ELECT REM PT RETURN 9FT ADLT (ELECTROSURGICAL) ×3
ELECTRODE REM PT RTRN 9FT ADLT (ELECTROSURGICAL) ×2 IMPLANT
EXTRACTOR VACUUM M CUP 4 TUBE (SUCTIONS) IMPLANT
GLOVE BIOGEL PI IND STRL 7.0 (GLOVE) ×6 IMPLANT
GLOVE BIOGEL PI INDICATOR 7.0 (GLOVE) ×3
GLOVE ECLIPSE 7.0 STRL STRAW (GLOVE) ×3 IMPLANT
GOWN STRL REUS W/TWL LRG LVL3 (GOWN DISPOSABLE) ×6 IMPLANT
KIT ABG SYR 3ML LUER SLIP (SYRINGE) IMPLANT
KIT PREVENA INCISION MGT20CM45 (CANNISTER) ×3 IMPLANT
NEEDLE HYPO 22GX1.5 SAFETY (NEEDLE) ×3 IMPLANT
NEEDLE HYPO 25X5/8 SAFETYGLIDE (NEEDLE) ×3 IMPLANT
NS IRRIG 1000ML POUR BTL (IV SOLUTION) ×3 IMPLANT
PACK C SECTION WH (CUSTOM PROCEDURE TRAY) ×3 IMPLANT
PAD ABD 7.5X8 STRL (GAUZE/BANDAGES/DRESSINGS) ×3 IMPLANT
PAD OB MATERNITY 4.3X12.25 (PERSONAL CARE ITEMS) ×3 IMPLANT
PENCIL SMOKE EVAC W/HOLSTER (ELECTROSURGICAL) ×3 IMPLANT
RTRCTR C-SECT PINK 25CM LRG (MISCELLANEOUS) IMPLANT
SUT PDS AB 0 CTX 36 PDP370T (SUTURE) ×3 IMPLANT
SUT PLAIN 2 0 XLH (SUTURE) IMPLANT
SUT VIC AB 0 CTX 36 (SUTURE) ×2
SUT VIC AB 0 CTX36XBRD ANBCTRL (SUTURE) ×4 IMPLANT
SUT VIC AB 4-0 KS 27 (SUTURE) ×3 IMPLANT
SYR CONTROL 10ML LL (SYRINGE) ×3 IMPLANT
TOWEL OR 17X24 6PK STRL BLUE (TOWEL DISPOSABLE) ×3 IMPLANT
TRAY FOLEY W/BAG SLVR 14FR LF (SET/KITS/TRAYS/PACK) ×3 IMPLANT
WATER STERILE IRR 1000ML POUR (IV SOLUTION) ×3 IMPLANT

## 2018-07-09 NOTE — Op Note (Signed)
Traci Peterson PROCEDURE DATE: 07/09/2018  PREOPERATIVE DIAGNOSES:  Intrauterine pregnancy at [redacted]w[redacted]d weeks gestation; non-reassuring fetal status with BPP 2/10; uncontrolled Type II DM; cerclage in place  POSTOPERATIVE DIAGNOSES: The same  PROCEDURE: Primary Low Transverse Cesarean Section and Transvaginal Cerclage Removal  SURGEON:  Dr. Jaynie Collins  ASSISTANT:  Dr. Rhett Bannister  ANESTHESIOLOGY TEAM: Anesthesiologist: Mal Amabile, MD CRNA: Rhymer, Doree Fudge, CRNA  INDICATIONS: Traci Peterson is a 33 y.o. G3P0200 at [redacted]w[redacted]d here for procedures secondary to the indications listed under preoperative diagnoses; please see preoperative note for further details.  The risks of cesarean section were discussed with the patient including but were not limited to: bleeding which may require transfusion or reoperation; infection which may require antibiotics; injury to bowel, bladder, ureters or other surrounding organs; injury to the fetus; need for additional procedures including hysterectomy in the event of a life-threatening hemorrhage; placental abnormalities wth subsequent pregnancies, incisional problems, thromboembolic phenomenon and other postoperative/anesthesia complications.   The patient concurred with the proposed plan, giving informed written consent for the procedure.    FINDINGS:  Viable female infant in cephalic presentation.  Fetus was immediately handed to NICU team . He was noted to have significant respiratory depression at birth despite normal heart rate, had to be intubated. Apgars and arterial cord pH pending at time of this note.   Clear amniotic fluid.  Intact placenta, three vessel cord.  Normal uterus, fallopian tubes and ovaries bilaterally.  ANESTHESIA: Spinal INTRAVENOUS FLUIDS: 1800 ml   ESTIMATED BLOOD LOSS: 500 ml URINE OUTPUT:  400 ml SPECIMENS: Placenta sent to pathology COMPLICATIONS: None immediate  PROCEDURE IN DETAIL:  The patient preoperatively  received intravenous antibiotics and had sequential compression devices applied to her lower extremities.  She was then taken to the operating room where spinal anesthesia was administered and was found to be adequate. She was then placed in a dorsal supine position with a leftward tilt, and prepped and draped in a sterile manner.  A foley catheter was placed into her bladder and attached to constant gravity.  After an adequate timeout was performed, a Pfannenstiel skin incision was made with scalpel and carried through to the underlying layer of fascia. The fascia was incised in the midline, and this incision was extended bilaterally using the Mayo scissors.  Kocher clamps were applied to the superior aspect of the fascial incision and the underlying rectus muscles were dissected off bluntly and sharply.  A similar process was carried out on the inferior aspect of the fascial incision. The rectus muscles were separated in the midline and the peritoneum was entered bluntly. The Alexis self-retaining retractor was introduced into the abdominal cavity.  Attention was turned to the lower uterine segment where a low transverse hysterotomy was made with a scalpel and extended bilaterally bluntly.  The infant was successfully delivered, the cord was clamped and cut after one minute, and the infant was handed over to the awaiting neonatology team. Uterine massage was then administered, and the placenta delivered intact with a three-vessel cord. The uterus was then cleared of clots and debris.  The hysterotomy was closed with 0 Vicryl in a running locked fashion, and an imbricating layer was also placed with 0 Vicryl.  Figure-of-eight 0 Vicryl serosal stitches were placed to help with hemostasis.  The pelvis was cleared of all clot and debris. Hemostasis was confirmed on all surfaces.  The retractor was removed.  The peritoneum and the rectus muscles were reapproximated using 0 Vicryl interrupted stitches. The  fascia was  then closed using 0 PDS in a running fashion.  The subcutaneous layer was irrigated,reapproximated with 2-0 plain gut interrupted stitches, and the skin was closed with a 4-0 Vicryl subcuticular stitch.  After the skin was closed, a Prevena disposable negative pressure wound therapy device was placed over the incision.  The suction was activated at a pressure of 80mmHg.  The adhesive was affixed well and there were no leaks noted. The patient tolerated the procedure well.   Attention was then turned to her pelvis and a vaginal speculum was placed. The anterior knot of cervical cerclage was recognized and pulled upwards to visualize both sides of the circumferential suture under the knot. One side was cut and the remaining suture was pulled and removed intact. There was minimal bleeding noted.  The speculum was removed from the vagina. The patient tolerated the procedures well. Sponge, instrument and needle counts were correct x 3.  She was taken to the recovery room in stable condition.    Jaynie CollinsUGONNA  Shandale Malak, MD, FACOG Obstetrician & Gynecologist, [facility], [facility]

## 2018-07-09 NOTE — Transfer of Care (Signed)
Immediate Anesthesia Transfer of Care Note  Patient: Traci Peterson  Procedure(s) Performed: CESAREAN SECTION (N/A Abdomen) CERCLAGE CERVICAL removal (Cervix)  Patient Location: PACU  Anesthesia Type:Spinal  Level of Consciousness: awake, alert  and oriented  Airway & Oxygen Therapy: Patient Spontanous Breathing  Post-op Assessment: Report given to RN and Post -op Vital signs reviewed and stable  Post vital signs: Reviewed and stable HR 85, RR 12, SaO2 99%, BP 118/89  Last Vitals:  Vitals Value Taken Time  BP    Temp    Pulse    Resp    SpO2      Last Pain:  Vitals:   07/09/18 0645  TempSrc:   PainSc: 0-No pain      Patients Stated Pain Goal: 2 (07/08/18 1922)  Complications: No apparent anesthesia complications

## 2018-07-09 NOTE — Anesthesia Procedure Notes (Signed)
Spinal  Patient location during procedure: OR Start time: 07/09/2018 8:43 AM End time: 07/09/2018 8:59 AM Staffing Anesthesiologist: Mal Amabile, MD Performed: anesthesiologist  Preanesthetic Checklist Completed: patient identified, site marked, surgical consent, pre-op evaluation, timeout performed, IV checked, risks and benefits discussed and monitors and equipment checked Spinal Block Patient position: sitting Prep: DuraPrep Patient monitoring: heart rate, cardiac monitor, continuous pulse ox and blood pressure Approach: midline Location: L4-5 Injection technique: single-shot Needle Needle type: Spinocan  Needle gauge: 25 G Needle length: 9 cm Needle insertion depth: 8 cm Assessment Sensory level: T4 Additional Notes Attempt x 3. Epidural space ID'd LOR with air. 25ga spinocan through epidural needle. CSF clear with free flow and no paresthesias. Patient tolerated procedure well. Adequate sensory level.

## 2018-07-09 NOTE — Anesthesia Preprocedure Evaluation (Addendum)
Anesthesia Evaluation  Patient identified by MRN, date of birth, ID band Patient awake    Reviewed: Allergy & Precautions, Patient's Chart, lab work & pertinent test results  History of Anesthesia Complications (+) history of anesthetic complications  Airway Mallampati: II  TM Distance: >3 FB Neck ROM: Full    Dental no notable dental hx. (+) Teeth Intact   Pulmonary Current Smoker,    Pulmonary exam normal breath sounds clear to auscultation       Cardiovascular negative cardio ROS Normal cardiovascular exam Rhythm:Regular Rate:Normal     Neuro/Psych  Headaches, PSYCHIATRIC DISORDERS Anxiety Depression    GI/Hepatic Neg liver ROS, GERD  Medicated and Controlled,  Endo/Other  diabetes, Well Controlled, Gestational, Insulin DependentMorbid obesityPCOS  Renal/GU negative Renal ROS  negative genitourinary   Musculoskeletal negative musculoskeletal ROS (+)   Abdominal (+) + obese,   Peds  Hematology  (+) anemia ,   Anesthesia Other Findings   Reproductive/Obstetrics (+) Pregnancy Hx/o PPROM 18 weeks previous pregnancy Cerclage in place 36 3/7 weeks BPP 2/10 Non reassuring FHR tracing                           Anesthesia Physical Anesthesia Plan  ASA: III  Anesthesia Plan: Spinal   Post-op Pain Management:    Induction:   PONV Risk Score and Plan: 3 and Scopolamine patch - Pre-op, Ondansetron and Treatment may vary due to age or medical condition  Airway Management Planned: Natural Airway  Additional Equipment:   Intra-op Plan:   Post-operative Plan:   Informed Consent: I have reviewed the patients History and Physical, chart, labs and discussed the procedure including the risks, benefits and alternatives for the proposed anesthesia with the patient or authorized representative who has indicated his/her understanding and acceptance.     Dental advisory given  Plan  Discussed with: CRNA and Surgeon  Anesthesia Plan Comments:         Anesthesia Quick Evaluation

## 2018-07-09 NOTE — Progress Notes (Signed)
I know Gabriel Rung and Dorinda Hill from her previous pregnancies and she was so happy to be able to celebrate the birth of her baby, while at the same time, managing the stress and anxiety of having a baby who is very sick in the NICU.  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    Our visit was brief because family arrived, but we will continue to check in on her as we are able.    Please page as needs arise.  [address] [place], bcc Pager, [phone] 2:48 PM    07/09/18 1400  Clinical Encounter Type  Visited With Patient  Visit Type Spiritual support  Spiritual Encounters  Spiritual Needs Emotional

## 2018-07-09 NOTE — Progress Notes (Signed)
Faculty Practice OB/GYN Attending Note  Subjective:  Called to evaluate patient with BPP 2/10 now; was called from MFM.  No contractions, no LOF or vaginal bleeding. Good FM.   Admitted on 07/06/2018 for Abnormal antenatal test/BPP 6/10, uncontrolled T2DM.    Objective:  Blood pressure 103/63, pulse 95, temperature 97.8 F (36.6 C), temperature source Oral, resp. rate 18, height 5\' 2"  (1.575 m), weight 131.5 kg, last menstrual period 10/27/2017, SpO2 98 %. FHT  Baseline 145 bpm, minimum variability, no accelerations, no decelerations Toco: no contractions Gen: NAD HENT: Normocephalic, atraumatic Lungs: Normal respiratory effort Heart: Regular rate noted Abdomen: NT, gravid fundus, soft Cervix: Deferred Ext: 2+ DTRs, no edema, no cyanosis, negative Homan's sign  Assessment & Plan:  33 y.o. G3P0200 at [redacted]w[redacted]d admitted for BPP 6/10, uncontrolled T2DM, now with BPP 2/10.  Given current non-reassuring fetal status and being remote from vaginal delivery, cesarean delivery was recommended.  The risks of cesarean section discussed with the patient included but were not limited to: bleeding which may require transfusion or reoperation; infection which may require antibiotics; injury to bowel, bladder, ureters or other surrounding organs; injury to the fetus; need for additional procedures including hysterectomy in the event of a life-threatening hemorrhage; placental abnormalities wth subsequent pregnancies, incisional problems, thromboembolic phenomenon and other postoperative/anesthesia complications. The patient concurred with the proposed plan, giving informed written consent for the procedure.   Patient has been NPO since last night she will remain NPO for procedure. Anesthesia and OR aware. Preoperative prophylactic antibiotics and SCDs ordered on call to the OR.  To OR when ready.   Jaynie Collins, MD, FACOG Obstetrician & Gynecologist, [facility] for [employer], [facility]

## 2018-07-09 NOTE — Anesthesia Postprocedure Evaluation (Signed)
Anesthesia Post Note  Patient: Environmental consultant  Procedure(s) Performed: CESAREAN SECTION (N/A Abdomen) CERCLAGE CERVICAL removal (Cervix)     Patient location during evaluation: [facility] Anesthesia Type: Spinal Level of consciousness: awake, awake and alert and oriented Pain management: pain level controlled Vital Signs Assessment: post-procedure vital signs reviewed and stable Respiratory status: spontaneous breathing, nonlabored ventilation and respiratory function stable Cardiovascular status: stable Postop Assessment: no headache, no backache, patient able to bend at knees, no apparent nausea or vomiting and adequate PO intake Anesthetic complications: no    Last Vitals:  Vitals:   07/09/18 1414 07/09/18 1523  BP: (!) 109/54 (!) 107/54  Pulse: 92 90  Resp: 18 18  Temp: 37 C 36.8 C  SpO2: 98% 98%    Last Pain:  Vitals:   07/09/18 1523  TempSrc: Oral  PainSc:    Pain Goal: Patients Stated Pain Goal: 2 (07/08/18 1922)                 Doriann Zuch

## 2018-07-10 ENCOUNTER — Encounter (HOSPITAL_COMMUNITY): Payer: Self-pay | Admitting: Obstetrics & Gynecology

## 2018-07-10 DIAGNOSIS — IMO0002 Reserved for concepts with insufficient information to code with codable children: Secondary | ICD-10-CM | POA: Diagnosis not present

## 2018-07-10 DIAGNOSIS — M340 Progressive systemic sclerosis: Secondary | ICD-10-CM

## 2018-07-10 LAB — CBC
HCT: 34.9 % — ABNORMAL LOW (ref 36.0–46.0)
Hemoglobin: 11.5 g/dL — ABNORMAL LOW (ref 12.0–15.0)
MCH: 29.3 pg (ref 26.0–34.0)
MCHC: 33 g/dL (ref 30.0–36.0)
MCV: 89 fL (ref 80.0–100.0)
NRBC: 0.4 % — AB (ref 0.0–0.2)
Platelets: 194 10*3/uL (ref 150–400)
RBC: 3.92 MIL/uL (ref 3.87–5.11)
RDW: 15.5 % (ref 11.5–15.5)
WBC: 10.6 10*3/uL — ABNORMAL HIGH (ref 4.0–10.5)

## 2018-07-10 LAB — COMPREHENSIVE METABOLIC PANEL
ALT: 24 U/L (ref 0–44)
AST: 29 U/L (ref 15–41)
Albumin: 2.8 g/dL — ABNORMAL LOW (ref 3.5–5.0)
Alkaline Phosphatase: 88 U/L (ref 38–126)
Anion gap: 9 (ref 5–15)
BILIRUBIN TOTAL: 0.7 mg/dL (ref 0.3–1.2)
BUN: 11 mg/dL (ref 6–20)
CO2: 19 mmol/L — ABNORMAL LOW (ref 22–32)
Calcium: 8.6 mg/dL — ABNORMAL LOW (ref 8.9–10.3)
Chloride: 104 mmol/L (ref 98–111)
Creatinine, Ser: 0.59 mg/dL (ref 0.44–1.00)
GFR calc Af Amer: >60 mL/min (ref >60)
GFR calc non Af Amer: >60 mL/min (ref >60)
Glucose, Bld: 111 mg/dL — ABNORMAL HIGH (ref 70–99)
POTASSIUM: 3.8 mmol/L (ref 3.5–5.1)
Sodium: 132 mmol/L — ABNORMAL LOW (ref 135–145)
Total Protein: 6.7 g/dL (ref 6.5–8.1)

## 2018-07-10 LAB — GLUCOSE, CAPILLARY: Glucose-Capillary: 89 mg/dL (ref 70–99)

## 2018-07-10 MED ORDER — INSULIN NPH (HUMAN) (ISOPHANE) 100 UNIT/ML ~~LOC~~ SUSP: 15.0000 [IU] | Freq: Two times a day (BID) | SUBCUTANEOUS | 11 refills | Status: DC

## 2018-07-10 MED ORDER — METFORMIN HCL 1000 MG PO TABS: 1000.0000 mg | ORAL_TABLET | Freq: Two times a day (BID) | ORAL | 2 refills | Status: DC

## 2018-07-10 MED ORDER — IBUPROFEN 800 MG PO TABS: 800.0000 mg | ORAL_TABLET | Freq: Three times a day (TID) | ORAL | 2 refills | Status: DC | PRN

## 2018-07-10 MED ORDER — OXYCODONE-ACETAMINOPHEN 5-325 MG PO TABS: 1.0000 | ORAL_TABLET | ORAL | 0 refills | Status: DC | PRN

## 2018-07-10 MED ORDER — INSULIN ASPART 100 UNIT/ML ~~LOC~~ SOLN: 5.0000 [IU] | Freq: Three times a day (TID) | SUBCUTANEOUS | 11 refills | Status: DC

## 2018-07-10 MED ORDER — DOCUSATE SODIUM 100 MG PO CAPS: 100.0000 mg | ORAL_CAPSULE | Freq: Two times a day (BID) | ORAL | 2 refills | Status: DC | PRN

## 2018-07-10 MED ORDER — SIMETHICONE 80 MG PO CHEW: 80.0000 mg | CHEWABLE_TABLET | ORAL | 2 refills | Status: DC | PRN

## 2018-07-10 NOTE — Discharge Summary (Addendum)
OB Discharge Summary     Patient Name: Traci Peterson DOB: May 25, 1986 MRN: [id number]  Date of admission: 07/06/2018 Delivering MD: Verita Schneiders A   Date of discharge: 07/10/2018  Admitting diagnosis: 12 WKS, PAIN LEFT SIDE, BACK, BREATHING DIFFICULTY Intrauterine pregnancy: [redacted]w[redacted]d    Secondary diagnosis:  Principal Problem:   Abnormal antenatal test Active Problems:   Incompetent cervix   Pre-existing type 2 diabetes mellitus during pregnancy, antepartum   Insulin controlled gestational diabetes mellitus (GDM) during pregnancy   [place]horionitis ([place]  Procedures:  Primary Low Transverse Cesarean Sectionand Transvaginal Cerclage Removal     Discharge diagnosis: Preterm Pregnancy Delivered and Type 2 DM                                                                            Post partum procedures:None  Augmentation: None  Complications: None  Brief Hospital Course: 33y.o. is a G3P0200 who was admitted 07/06/2018 at 313w1dith BPP 6/10 in the setting of uncontrolled diabetes.  She was admitted to antenatal unit and started on betamethasone.  Repeat BPP was 8/10. Given concern about her blood sugars, the decision was made to continue observation of the mother and fetus until scheduled induction of labor at 313 weeks Her FHR remained reassuring and occasionally reactive, and her blood sugars were managed on insulin as per Diabetic Coordinator recommendations.  However, repeat BPP ordered on 07/09/2018 showed a score of 2/10, FHR tracing showed minimal variability without accelerations or decelerations. The decision was made to proceed with cesarean delivery given that she was remote from vaginal delivery. Please refer to operative note for more details of the procedure; neonate was noted to have significant respiratory depression needing intubation in OR. No other complications.  [facility] POD#1[facility]  Due to further complications, and patient  desired early discharge so she can be with her baby.  Of note, patient had no signs of infection during her entire hospitalization but pathology analysis of the  placenta showed chorionitis.  By time of discharge on PPD#1, her pain was controlled on oral pain medications; she had appropriate lochia and was ambulating, voiding without difficulty, tolerating regular diet , passing flatus and having bowel movements.  She had a negative wound pressure dressing over her incision which will remain in place until POD#7; she was given instructions on how to take care of this. Outpatient follow up appointments were made for her. She was deemed stable for discharge to home.    Discharge Exam: Blood pressure 129/68, pulse 83, temperature 97.8 F (36.6 C), temperature source Oral, resp. rate 18, height 5' 2"  (1.575 m), weight 131.5 kg, last menstrual period 10/27/2017, SpO2 100 %. General appearance: Alert and no distress Resp: Normal breath sounds Cardio: Regular rate noted Abdomen: Fundus below umbilicus, appropriate lochia. NT abdomen. Incision is covered by Prevena dressing. Extremities: Extremities normal, atraumatic, no cyanosis or edema and Homans sign is negative, no sign of DVT Pulses: 2+ and symmetric Neurologic: Alert and oriented X 3, normal strength and tone. Normal symmetric reflexes. Normal coordination and gait  Labs: CBC Latest Ref Rng & Units 07/10/2018 07/07/2018 02/15/2018  WBC 4.0 - 10.5 K/uL  10.6(H) 7.1 8.5  Hemoglobin 12.0 - 15.0 g/dL 11.5(L) 11.4(L) 14.6  Hematocrit 36.0 - 46.0 % 34.9(L) 34.4(L) 42.2  Platelets 150 - 400 K/uL 194 183 196   CMP Latest Ref Rng & Units 07/10/2018 02/15/2018 01/21/2018  Glucose 70 - 99 mg/dL 111(H) 92 105(H)  BUN 6 - 20 mg/dL 11 7 8   Creatinine 0.44 - 1.00 mg/dL 0.59 0.57 0.68  Sodium 135 - 145 mmol/L 132(L) 136 134(L)  Potassium 3.5 - 5.1 mmol/L 3.8 4.5 3.9  Chloride 98 - 111 mmol/L 104 104 106  CO2 22 - 32 mmol/L 19(L) 22 21(L)  Calcium 8.9 - 10.3  mg/dL 8.6(L) 8.9 8.8(L)  Total Protein 6.5 - 8.1 g/dL 6.7 7.0 -  Total Bilirubin 0.3 - 1.2 mg/dL 0.7 1.2 -  Alkaline Phos 38 - 126 U/L 88 54 -  AST 15 - 41 U/L 29 34 -  ALT 0 - 44 U/L 24 24 -   07/09/2018 Placenta Pathology - PLACENTA, 528 GRAMS. - ACUTE CHORIONITIS (SUBCHORIONITIS, STAGE 1). - THREE VESSEL UMBILICAL CORD.  Discharge instruction: per After Visit Summary and "Baby and Me Booklet".  After visit meds:  Allergies as of 07/10/2018      Reactions   Lactose Intolerance (gi) Diarrhea      Medication List    STOP taking these medications   ACCU-CHEK FASTCLIX LANCETS Misc   ACCU-CHEK NANO SMARTVIEW w/Device Kit   esomeprazole 20 MG capsule Commonly known as:  NEXIUM   glucose blood test strip Commonly known as:  ACCU-CHEK SMARTVIEW   insulin glargine 100 UNIT/ML injection Commonly known as:  LANTUS   Insulin Syringe-Needle U-100 30G X 1/2" 1 ML Misc Commonly known as:  SAFETY INSULIN SYRINGES     TAKE these medications   aspirin 81 MG chewable tablet Chew 1 tablet (81 mg total) by mouth daily.   docusate sodium 100 MG capsule Commonly known as:  COLACE Take 1 capsule (100 mg total) by mouth 2 (two) times daily as needed for mild constipation or moderate constipation.   ibuprofen 800 MG tablet Commonly known as:  ADVIL,MOTRIN Take 1 tablet (800 mg total) by mouth every 8 (eight) hours as needed.   insulin aspart 100 UNIT/ML injection Commonly known as:  novoLOG Inject 5 Units into the skin 3 (three) times daily with meals.   insulin NPH Human 100 UNIT/ML injection Commonly known as:  HUMULIN N,NOVOLIN N Inject 0.15 mLs (15 Units total) into the skin 2 (two) times daily at 8 am and 10 pm.   metFORMIN 1000 MG tablet Commonly known as:  GLUCOPHAGE Take 1 tablet (1,000 mg total) by mouth 2 (two) times daily with a meal.   oxyCODONE-acetaminophen 5-325 MG tablet Commonly known as:  PERCOCET/ROXICET Take 1 tablet by mouth every 4 (four) hours as needed  for severe pain ((when tolerating fluids)).   PRENATAL VITAMINS PO Take 1 tablet by mouth daily.   simethicone 80 MG chewable tablet Commonly known as:  MYLICON Chew 1 tablet (80 mg total) by mouth as needed (gas pain).   Vitamin D3 50 MCG (2000 UT) Tabs Take 2,000 Units by mouth daily.            Discharge Care Instructions  (From admission, onward)         Start     Ordered   07/10/18 0000  Discharge wound care:    Comments:  As per discharge handout and nursing instructions   07/10/18 1010  Diet: carb modified diet  Activity: Advance as tolerated. Pelvic rest for 6 weeks.   Outpatient follow up: 1 week at [facility]  Postpartum contraception: Undecided  Newborn Data: Live born female  Birth Weight: 8 lb 5.7 oz (3790 g) APGAR: 3, 4, 6  Newborn Delivery   Birth date/time:  07/09/2018 09:20:00 Delivery type:  C-Section, Low Transverse Trial of labor:  No C-section categorization:  Primary     Baby Feeding: Bottle and Breast Disposition: NICU at [facility]   07/10/2018 Verita Schneiders, MD

## 2018-07-10 NOTE — Discharge Instructions (Signed)

## 2018-07-10 NOTE — Progress Notes (Signed)
Pt excited to be discharged to see baby  In [place]  Ambulated out  Wound vac discussed with pt

## 2018-07-10 NOTE — Progress Notes (Signed)
Pt recently learned that her baby is being transferred to [facility]. Pt told nurse she wants to go with the baby. RN called Dr to inform of pt's wishes. Dr advises against d/c tonight but said they will round in the AM and possibly allow for an early d/c. Pt understands and agrees.   Arva Chafe, RN

## 2018-07-10 NOTE — Lactation Note (Signed)
Lactation Consultation Note  Patient Name: Traci Peterson BTDVV'O Date: 07/10/2018  Initial visit made.  Baby has been transferred to [facility] hospital and mom will be discharged today.  Mom has a DEBP for home use.  Instructed to pump 8-12 times per day for 15-20 minutes.  Discussed milk coming to volume.  She will ask to see LC at [facility] for support.  Breastfeeding consultation and support information given and reviewed.  Mom has Providing Breastmilk For Your Baby in NICU book.  Outpatient services encouraged.  Questions answered.    Lactation Tools Discussed/Used     Consult Status      Huston Foley 07/10/2018, 10:46 AM

## 2018-07-13 ENCOUNTER — Inpatient Hospital Stay (HOSPITAL_COMMUNITY): Admission: RE | Admit: 2018-07-13 | Payer: Medicaid Other | Source: Ambulatory Visit

## 2018-07-16 ENCOUNTER — Ambulatory Visit: Payer: Self-pay | Admitting: Obstetrics and Gynecology

## 2018-07-22 ENCOUNTER — Telehealth: Payer: Self-pay

## 2018-07-22 NOTE — Telephone Encounter (Signed)
A home nurse called stating pt has a blood pressure reading of 140/100. Mariel Aloe, RN told me to tell home nurse to have pt go to hospital if she has headaches or visual changes. Home nurse stated she would let her know. I also encouraged pt to keep appt here in office tomorrow on 07/22/2018.

## 2018-07-23 ENCOUNTER — Other Ambulatory Visit (HOSPITAL_COMMUNITY)
Admission: RE | Admit: 2018-07-23 | Discharge: 2018-07-23 | Disposition: A | Discharge status: Home / Self Care | Payer: Medicaid Other | Source: Ambulatory Visit | Attending: Obstetrics and Gynecology | Admitting: Obstetrics and Gynecology

## 2018-07-23 ENCOUNTER — Ambulatory Visit (INDEPENDENT_AMBULATORY_CARE_PROVIDER_SITE_OTHER): Payer: Medicaid Other | Admitting: Obstetrics and Gynecology

## 2018-07-23 ENCOUNTER — Encounter: Payer: Self-pay | Admitting: Obstetrics and Gynecology

## 2018-07-23 VITALS — BP 106/63 | HR 64 | Resp 16 | Ht 65.0 in | Wt 272.0 lb

## 2018-07-23 DIAGNOSIS — N644 Mastodynia: Secondary | ICD-10-CM

## 2018-07-23 DIAGNOSIS — N898 Other specified noninflammatory disorders of vagina: Secondary | ICD-10-CM

## 2018-07-23 DIAGNOSIS — Z5189 Encounter for other specified aftercare: Secondary | ICD-10-CM

## 2018-07-23 MED ORDER — ZOLPIDEM TARTRATE 10 MG PO TABS: 10.0000 mg | ORAL_TABLET | Freq: Every evening | ORAL | 0 refills | Status: DC | PRN

## 2018-07-23 NOTE — Progress Notes (Signed)
[place] scale 10 GAD 7     11 PhQ-9   11

## 2018-07-23 NOTE — Progress Notes (Signed)
[name] is a 33 y.o. status post primary cesarean section on 2/12; she was discharged from the hospital on 2/13. Her post partum course has been complicated by fetal loss. She is here today for a wound check and a mood check. She is overall doing well well, she has a lot of faith and that is keeping her moving. She is happy to be able to return to work and she is finding joy and grieving approprietly. She has plans to start counseling at a place she has gone to in the past. She denies pain at this time. She is requesting something to help her sleep at night. She does not need it all the time however would like to have it incase she needs it. She is taking percocet very minimally, and using ibuprofen as needed. She was recently seen at [facility] and diagnosed with endometritis. She was treated and is currently taking the antibiotics as prescribed.   She is requesting that I look at her right breast. Says she feels a cyst there. Her husband attempted to drain it however it did not get any better. Also having some odorous discharge. Minimal bleeding.   GENERAL: Well-developed, well-nourished female in no acute distress.  LUNGS: Effort normal SKIN: Warm, dry and without erythema BREAST: Pea size firm nodule noted at 7 o'clock on the right breast. Some tenderness with palpation.  PSYCH: Normal mood and affect ABDOMEN: soft, non tender, low transverse c-section incision is well approximated, some very minimal drainage noted. No odor. Peau d'orange noted. No cellulitis.   Blood pressure 106/63, pulse 64, resp. rate 16, height 5\' 5"  (1.651 m), weight 272 lb (123.4 kg), last menstrual period 10/27/2017.   A:  1. Breast pain, right  - US BREAST COMPLETE UNI RIGHT INC AXILLA; Future  2. Vaginal discharge  - Cervicovaginal ancillary only( [place])  3. Visit for wound check  - Doing well. - ABD pads given. Continue to keep the area clean and dry - Return to office as scheduled for post  partum visit.  Duane Lope, NP 07/24/2018 8:26 AM

## 2018-07-24 LAB — CERVICOVAGINAL ANCILLARY ONLY
Bacterial vaginitis: NEGATIVE
Candida vaginitis: NEGATIVE
Chlamydia: NEGATIVE
Neisseria Gonorrhea: NEGATIVE
Trichomonas: NEGATIVE

## 2018-07-29 ENCOUNTER — Other Ambulatory Visit: Payer: Self-pay | Admitting: Obstetrics and Gynecology

## 2018-07-29 DIAGNOSIS — N644 Mastodynia: Secondary | ICD-10-CM

## 2018-08-05 ENCOUNTER — Other Ambulatory Visit: Payer: Self-pay

## 2018-08-06 ENCOUNTER — Ambulatory Visit: Payer: Self-pay

## 2018-08-06 ENCOUNTER — Ambulatory Visit
Admission: RE | Admit: 2018-08-06 | Discharge: 2018-08-06 | Disposition: A | Discharge status: Home / Self Care | Payer: Medicaid Other | Source: Ambulatory Visit | Attending: Obstetrics and Gynecology | Admitting: Obstetrics and Gynecology

## 2018-08-06 ENCOUNTER — Encounter: Payer: Self-pay | Admitting: Obstetrics and Gynecology

## 2018-08-06 ENCOUNTER — Other Ambulatory Visit: Payer: Self-pay

## 2018-08-06 ENCOUNTER — Telehealth: Payer: Self-pay | Admitting: Obstetrics and Gynecology

## 2018-08-06 ENCOUNTER — Ambulatory Visit (INDEPENDENT_AMBULATORY_CARE_PROVIDER_SITE_OTHER): Payer: Medicaid Other | Admitting: Obstetrics and Gynecology

## 2018-08-06 VITALS — BP 108/80 | HR 88 | Resp 16 | Ht 62.0 in | Wt 265.0 lb

## 2018-08-06 DIAGNOSIS — Z3202 Encounter for pregnancy test, result negative: Secondary | ICD-10-CM | POA: Diagnosis not present

## 2018-08-06 DIAGNOSIS — Z1389 Encounter for screening for other disorder: Secondary | ICD-10-CM | POA: Diagnosis not present

## 2018-08-06 DIAGNOSIS — N644 Mastodynia: Secondary | ICD-10-CM

## 2018-08-06 DIAGNOSIS — Z30017 Encounter for initial prescription of implantable subdermal contraceptive: Secondary | ICD-10-CM

## 2018-08-06 LAB — POCT URINE PREGNANCY: Preg Test, Ur: NEGATIVE

## 2018-08-06 MED ORDER — SULFAMETHOXAZOLE-TRIMETHOPRIM 800-160 MG PO TABS: 1.0000 | ORAL_TABLET | Freq: Two times a day (BID) | ORAL | 0 refills | Status: DC

## 2018-08-06 MED ORDER — ZOLPIDEM TARTRATE 10 MG PO TABS: 10.0000 mg | ORAL_TABLET | Freq: Every evening | ORAL | 1 refills | Status: DC | PRN

## 2018-08-06 MED ORDER — HYDROXYZINE PAMOATE 25 MG PO CAPS: 25.0000 mg | ORAL_CAPSULE | Freq: Three times a day (TID) | ORAL | 0 refills | Status: DC | PRN

## 2018-08-06 MED ORDER — ETONOGESTREL 68 MG ~~LOC~~ IMPL
68.0000 mg | DRUG_IMPLANT | Freq: Once | SUBCUTANEOUS | Status: AC
  Administered 2018-08-06: 68 mg via SUBCUTANEOUS

## 2018-08-06 NOTE — Telephone Encounter (Signed)
Called [name] to discuss recent breast US results. Radiologist recommends antibiotics for abscess. Dicussed this with her.   Rx: Bactrim BID x 10 days. Questions answered. She is to return to the office if symptoms do not improve.    Duane Lope, NP 08/06/2018 2:48 PM

## 2018-08-06 NOTE — Progress Notes (Signed)
Post Partum Exam  Tuwana Lofthouse is a 33 y.o. G52P0200 female who presents for a postpartum visit. She is 4 weeks postpartum following a low cervical transverse Cesarean section due to non-reassuring fetal testing. I have fully reviewed the prenatal and intrapartum course. The delivery was at [redacted]w[redacted]d gestational weeks.  Anesthesia: epidural. Postpartum course has been remarkable with a readmit to [facility] for endometritis. Baby was transported to [facility] and past way 2 days later..Bleeding no bleeding. Bowel function is normal. Bladder function is normal. Patient is sexually active. Contraception method is Nexplanon. Postpartum depression screening:positive at 10  The following portions of the patient's history were reviewed and updated as appropriate: allergies, current medications, past family history, past medical history, past social history, past surgical history and problem list. Last pap smear done 01/01/2018 and was Normal  Review of Systems Pertinent items are noted in HPI.    Objective:  Blood pressure 108/80, pulse 88, resp. rate 16, height 5\' 2"  (1.575 m), weight 265 lb (120.2 kg), last menstrual period 10/27/2017, unknown if currently breastfeeding.  General:  alert, cooperative and no distress  Lungs: clear to auscultation bilaterally  Heart:  regular rate and rhythm, S1, S2 normal, no murmur, click, rub or gallop  Abdomen: soft, non-tender; bowel sounds normal; no masses,  no organomegaly Cesarean incision check: incision clean, dry, intact.         Assessment:   Normal postpartum exam. Pap smear not done at today's visit.   Plan:   1. Contraception: Nexplanon 2. Insomnia due to fetal loss: had issues with filling ambien, Rx resent, and vistaril PRN 3. Follow up as needed.      GYNECOLOGY OFFICE PROCEDURE NOTE  Tania Haagen is a 33 y.o. G3P0200 here for Nexplanon insertion.  Last pap smear was on 2019 and was normal.  No other gynecologic concerns.  Nexplanon  Insertion Procedure Patient identified, informed consent performed, consent signed.   Patient does understand that irregular bleeding is a very common side effect of this medication. She was advised to have backup contraception for one week after placement. Pregnancy test in clinic today was negative.  Appropriate time out taken.  Patient's left arm was prepped and draped in the usual sterile fashion. The ruler used to measure and mark insertion area.  Patient was prepped with alcohol swab and then injected with 3 ml of 1% lidocaine.  She was prepped with betadine, Nexplanon removed from packaging,  Device confirmed in needle, then inserted full length of needle and withdrawn per handbook instructions. Nexplanon was able to palpated in the patient's arm; patient palpated the insert herself. There was minimal blood loss.  Patient insertion site covered with guaze and a pressure bandage to reduce any bruising.  The patient tolerated the procedure well and was given post procedure instructions.     Rasch, Harolyn Rutherford, NP [facility] [employer]

## 2018-08-14 ENCOUNTER — Other Ambulatory Visit: Payer: Self-pay | Admitting: *Deleted

## 2018-08-14 MED ORDER — IBUPROFEN 800 MG PO TABS: 800.0000 mg | ORAL_TABLET | Freq: Three times a day (TID) | ORAL | 2 refills | Status: DC | PRN

## 2018-08-14 MED ORDER — ZOLPIDEM TARTRATE 10 MG PO TABS: 10.0000 mg | ORAL_TABLET | Freq: Every evening | ORAL | 1 refills | Status: DC | PRN

## 2018-08-14 MED ORDER — SULFAMETHOXAZOLE-TRIMETHOPRIM 800-160 MG PO TABS: 1.0000 | ORAL_TABLET | Freq: Two times a day (BID) | ORAL | 0 refills | Status: DC

## 2018-08-14 NOTE — Telephone Encounter (Signed)
Pt called stating that her antibiotic, Ambien and Ibuprofen has not been received by [facility] on [address] in [place].  Checked with the pharmacy and even though it is Epic that it was sent by Blanche East, NP they have no record.  RX resent to [facility].

## 2019-07-10 ENCOUNTER — Telehealth: Payer: Self-pay

## 2019-07-10 NOTE — Telephone Encounter (Signed)
This Patient called in to see if she can get clearance on becoming a new patient with Dr. Patsy Lager  Her mother is a patient of Dr. Patsy Lager and she would like to be a patient as well.   If possible please follow up with the patient at [phone]   Thanks,

## 2019-08-07 ENCOUNTER — Other Ambulatory Visit: Payer: Self-pay

## 2019-08-08 NOTE — Progress Notes (Addendum)
[place] [facility] [employer] [place], [place], [place] [address] [phone]  Date:  08/10/2019   Name:  Traci Peterson   DOB:  Feb 16, 1986   MRN:  [id number]  PCP:  Traci Mclean, MD    Chief Complaint: New Patient (Initial Visit) and Foot Problem (bilateral foot fungus)   History of Present Illness:  Traci Peterson is a 34 y.o. very pleasant female patient who presents with the following:  New patient here today to establish care-I take care of her mother Traci Peterson History of type 2 diabetes, PCOS, depression, obesity She delivered a baby February 2020-  He passed 3 days after birth with pulmonary HTN and a brain bleed.  His name was Traci Peterson.  This loss was especially tough, as it was the first child she has carried to term.  She had 3 previous miscarriages  She is doing therapy to help process this trauma, she does feel it is helping her be somewhat  She has history of GDM or type 2 DM; she is following a keto diet We are not sure if she has DM now but will check her A1c  She has lost about 20 lbs over the last several months with her lower carb diet She is not doing a lot of exercise  She does have hopes to become more active  She has 2 maternal aunts with history of breast cancer; plan to start mammogram at age 77 with baseline  She smokes an occasional cigarette- she is trying to quit She does not drink much alcohol- occasional wine   She had an abnormal pap in the past- she did have trich in 2019 which was treated. She also thinks she had HPV in the past but we do not have this definite history  No chance of pregnancy, she currently has Nexplanon in place   Wt Readings from Last 3 Encounters:  08/10/19 263 lb (119.3 kg)  08/06/18 265 lb (120.2 kg)  07/23/18 272 lb (123.4 kg)     Patient Active Problem List   Diagnosis Date Noted  . Breast pain, right 07/23/2018  . Insulin controlled gestational  diabetes mellitus (GDM) during pregnancy 05/08/2018  . Heartburn in pregnancy 02/13/2018  . Trichomonal vaginitis during pregnancy 01/20/2018  . Supervision of high risk pregnancy, antepartum 01/01/2018  . Pre-existing type 2 diabetes mellitus during pregnancy, antepartum 12/16/2017  . Type 2 diabetes mellitus ([facility]) 12/15/2017  . Vitamin D deficiency 12/10/2017  . MDD (major depressive disorder), recurrent episode, severe ([place]) 05/29/2017  . Incompetent cervix 09/12/2016  . Gall stones 07/19/2016  . Family history of breast cancer in female 06/06/2016  . Nexplanon insertion 08/08/2015  . Obesity in pregnancy 10/25/2014  . Eczema 10/25/2014  . 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 Chronic headaches -  no meds 10/25/2014  . Anxiety and depression 11/30/2013  . PCOS (polycystic ovarian syndrome) 04/20/2013    Past Medical History:  Diagnosis Date  . Anxiety    no current med.  . Cervix prolapsed into vagina   . Chronic headaches    otc med prn  . Complication of anesthesia    narrow airway and had difficulty breathing after cholecystectomy  . Constipation   . Depression    no meds currently  . Diabetes mellitus without complication ([facility])    type 2  . Gall stones  Resolved with surgery  . GERD (gastroesophageal reflux disease)   . Gestational diabetes   . HPV in female   . Jaw snapping    states jaw pops if opens mouth too wide  . Ovarian cyst   . PCOS (polycystic ovarian syndrome)   . Pilonidal cyst 02/2013  . Preterm labor 2016   PPROM @ 18 wks, delivery of IUFD @ 21 wks  . Seasonal allergies   . SVD (spontaneous vaginal delivery)    x 2 - both fetal demises 2nd trim.  . Vaginal Pap smear, abnormal     Past Surgical History:  Procedure Laterality Date  . CERVICAL CERCLAGE N/A 01/21/2018   Procedure: CERCLAGE CERVICAL;  Surgeon: Lesly Dukes, MD;  Location: [facility];   Service: Gynecology;  Laterality: N/A;  . CERVICAL CERCLAGE  07/09/2018   Procedure: CERCLAGE CERVICAL removal;  Surgeon: Tereso Newcomer, MD;  Location: [facility];  Service: Obstetrics;;  after c-section cerclage removed   . CESAREAN SECTION N/A 07/09/2018   Procedure: CESAREAN SECTION;  Surgeon: Tereso Newcomer, MD;  Location: [facility]TH[facility];  Service: Obstetrics;  Laterality: N/A;  . CHOLECYSTECTOMY N/A 07/21/2016   Procedure: LAPAROSCOPIC CHOLECYSTECTOMY;  Surgeon: Abigail Miyamoto, MD;  Location: [facility] OR;  Service: General;  Laterality: N/A;  . PILONIDAL CYST EXCISION N/A 03/12/2013   Procedure: CYST EXCISION PILONIDAL EXTENSIVE;  Surgeon: Shelly Rubenstein, MD;  Location: [facility];  Service: General;  Laterality: N/A;  . WISDOM TOOTH EXTRACTION      Social History   Tobacco Use  . Smoking status: Current Some Day Smoker    Packs/day: 0.25    Years: 12.00    Pack years: 3.00    Types: Cigarettes    Last attempt to quit: 01/10/2018    Years since quitting: 1.5  . Smokeless tobacco: Never Used  Substance Use Topics  . Alcohol use: No    Comment: socially but none with pregnancy  . Drug use: Not Currently    Types: Hydrocodone    Comment: prescribed this medication    Family History  Problem Relation Age of Onset  . Hypertension Mother   . Hyperlipidemia Mother   . Diabetes Mother   . Thyroid disease Mother   . Diabetes Father   . Hypertension Father   . Alcohol abuse Father   . Breast cancer Maternal Aunt   . Pancreatic cancer Paternal Grandfather     Allergies  Allergen Reactions  . Lactose Intolerance (Gi) Diarrhea    Medication list has been reviewed and updated.  No current outpatient medications on file prior to visit.   No current facility-administered medications on file prior to visit.    Review of Systems:  As per HPI- otherwise negative.   Physical Examination: Vitals:   08/10/19 1101  BP: 123/83  Pulse: 91   Resp: 17  Temp: (!) 97.1 F (36.2 C)  SpO2: 98%   Vitals:   08/10/19 1101  Weight: 263 lb (119.3 kg)  Height: 5\' 2"  (1.575 m)   Body mass index is 48.1 kg/m. Ideal Body Weight: Weight in (lb) to have BMI = 25: 136.4  GEN: no acute distress.  Obese, looks well  HEENT: Atraumatic, Normocephalic.  Ears and Nose: No external deformity. CV: RRR, No M/G/R. No JVD. No thrill. No extra heart sounds. PULM: CTA B, no wheezes, crackles, rhonchi. No retractions. No resp. distress. No accessory muscle use. ABD: S, NT, ND, +BS. No rebound. No HSM.  EXTR: No c/c/e PSYCH: Normally interactive. Conversant.  Pelvic exam/Pap today.  Normal external vulva, vagina, cervix Patient notes history of fungal infection of her toes for years.  Her toenails are normal except for some thickening of the pinky toenail bilaterally.  She has some dryness and cracking, moisture typical of tinea pedis between the toes   Assessment and Plan: Screening for diabetes mellitus - Plan: Comprehensive metabolic panel, Hemoglobin A1c  Screening for deficiency anemia - Plan: CBC  Screening for thyroid disorder - Plan: TSH  Screening for hyperlipidemia - Plan: Lipid panel  Screening for cervical cancer - Plan: Cytology - PAP  Tinea pedis of both feet - Plan: terbinafine (LAMISIL) 250 MG tablet  Here today to establish care and to check on health maintenance Labs pending as above, will determine if she has diabetes at this time and if so treat accordingly Pap collected We will treat tinea pedis with Lamisil 250 daily for 2 weeks This visit occurred during the SARS-CoV-2 public health emergency.  Safety protocols were in place, including screening questions prior to the visit, additional usage of staff PPE, and extensive cleaning of exam room while observing appropriate contact time as indicated for disinfecting solutions.    Signed Abbe Amsterdam, MD  Received her labs as below 3/16  Message to patient Last  A1c in February 2020 was 7.2%  Results for orders placed or performed in visit on 08/10/19  CBC  Result Value Ref Range   WBC 7.0 3.4 - 10.8 x10E3/uL   RBC 4.63 3.77 - 5.28 x10E6/uL   Hemoglobin 14.4 11.1 - 15.9 g/dL   Hematocrit 81.0 17.5 - 46.6 %   MCV 91 79 - 97 fL   MCH 31.1 26.6 - 33.0 pg   MCHC 34.4 31.5 - 35.7 g/dL   RDW 10.2 58.5 - 27.7 %   Platelets 215 150 - 450 x10E3/uL  Comprehensive metabolic panel  Result Value Ref Range   Glucose 81 65 - 99 mg/dL   BUN 9 6 - 20 mg/dL   Creatinine, Ser 8.24 0.57 - 1.00 mg/dL   GFR calc non Af Amer 89 >59 mL/min/1.73   GFR calc Af Amer 103 >59 mL/min/1.73   BUN/Creatinine Ratio 10 9 - 23   Sodium 140 134 - 144 mmol/L   Potassium 4.2 3.5 - 5.2 mmol/L   Chloride 104 96 - 106 mmol/L   CO2 23 20 - 29 mmol/L   Calcium 9.2 8.7 - 10.2 mg/dL   Total Protein 6.9 6.0 - 8.5 g/dL   Albumin 4.3 3.8 - 4.8 g/dL   Globulin, Total 2.6 1.5 - 4.5 g/dL   Albumin/Globulin Ratio 1.7 1.2 - 2.2   Bilirubin Total 0.2 0.0 - 1.2 mg/dL   Alkaline Phosphatase 79 39 - 117 IU/L   AST 21 0 - 40 IU/L   ALT 31 0 - 32 IU/L  Hemoglobin A1c  Result Value Ref Range   Hgb A1c MFr Bld 5.9 (H) 4.8 - 5.6 %   Est. average glucose Bld gHb Est-mCnc 123 mg/dL  Lipid panel  Result Value Ref Range   Cholesterol, Total 172 100 - 199 mg/dL   Triglycerides 235 0 - 149 mg/dL   HDL 47 >36 mg/dL   VLDL Cholesterol Cal 24 5 - 40 mg/dL   LDL Chol Calc (NIH) 144 (H) 0 - 99 mg/dL   Chol/HDL Ratio 3.7 0.0 - 4.4 ratio  TSH  Result Value Ref Range   TSH 0.965 0.450 - 4.500 uIU/mL

## 2019-08-10 ENCOUNTER — Encounter: Payer: Self-pay | Admitting: Family Medicine

## 2019-08-10 ENCOUNTER — Ambulatory Visit (INDEPENDENT_AMBULATORY_CARE_PROVIDER_SITE_OTHER): Payer: Managed Care, Other (non HMO) | Admitting: Family Medicine

## 2019-08-10 ENCOUNTER — Other Ambulatory Visit: Payer: Self-pay

## 2019-08-10 VITALS — BP 123/83 | HR 91 | Temp 97.1°F | Resp 17 | Ht 62.0 in | Wt 263.0 lb

## 2019-08-10 DIAGNOSIS — Z124 Encounter for screening for malignant neoplasm of cervix: Secondary | ICD-10-CM

## 2019-08-10 DIAGNOSIS — R7303 Prediabetes: Secondary | ICD-10-CM

## 2019-08-10 DIAGNOSIS — Z1329 Encounter for screening for other suspected endocrine disorder: Secondary | ICD-10-CM | POA: Diagnosis not present

## 2019-08-10 DIAGNOSIS — Z13220 Encounter for screening for lipoid disorders: Secondary | ICD-10-CM | POA: Diagnosis not present

## 2019-08-10 DIAGNOSIS — Z131 Encounter for screening for diabetes mellitus: Secondary | ICD-10-CM | POA: Diagnosis not present

## 2019-08-10 DIAGNOSIS — Z130 Encounter for screening for diseases of the blood and blood-forming organs and certain disorders involving the immune mechanism: Secondary | ICD-10-CM

## 2019-08-10 DIAGNOSIS — B353 Tinea pedis: Secondary | ICD-10-CM

## 2019-08-10 MED ORDER — TERBINAFINE HCL 250 MG PO TABS: 250.0000 mg | ORAL_TABLET | Freq: Every day | ORAL | 0 refills | Status: DC

## 2019-08-10 NOTE — Addendum Note (Signed)
Addended by: Harley Alto on: 08/10/2019 12:15 PM   Modules accepted: Orders

## 2019-08-10 NOTE — Patient Instructions (Addendum)
It was good to meet you today I am so sorry for the loss of your son DJ and the other pregnancy losses you have suffered.   We will be in touch with your labs and pap asap I will see if you still have diabetes; if so we will address it [place] job with weight loss so far- please keep this up  We will try terbinafine by mouth daily for 2 weeks for your toe fungus; you can also use a topical treatment such as a Tinactin spray

## 2019-08-11 ENCOUNTER — Encounter: Payer: Self-pay | Admitting: Family Medicine

## 2019-08-11 DIAGNOSIS — R7303 Prediabetes: Secondary | ICD-10-CM | POA: Insufficient documentation

## 2019-08-11 LAB — COMPREHENSIVE METABOLIC PANEL
ALT: 31 IU/L (ref 0–32)
AST: 21 IU/L (ref 0–40)
Albumin/Globulin Ratio: 1.7 (ref 1.2–2.2)
Albumin: 4.3 g/dL (ref 3.8–4.8)
Alkaline Phosphatase: 79 IU/L (ref 39–117)
BUN/Creatinine Ratio: 10 (ref 9–23)
BUN: 9 mg/dL (ref 6–20)
Bilirubin Total: 0.2 mg/dL (ref 0.0–1.2)
CO2: 23 mmol/L (ref 20–29)
Calcium: 9.2 mg/dL (ref 8.7–10.2)
Chloride: 104 mmol/L (ref 96–106)
Creatinine, Ser: 0.87 mg/dL (ref 0.57–1.00)
GFR calc Af Amer: 103 mL/min/{1.73_m2} (ref >59)
GFR calc non Af Amer: 89 mL/min/{1.73_m2} (ref >59)
Globulin, Total: 2.6 g/dL (ref 1.5–4.5)
Glucose: 81 mg/dL (ref 65–99)
Potassium: 4.2 mmol/L (ref 3.5–5.2)
Sodium: 140 mmol/L (ref 134–144)
Total Protein: 6.9 g/dL (ref 6.0–8.5)

## 2019-08-11 LAB — CBC
Hematocrit: 41.9 % (ref 34.0–46.6)
Hemoglobin: 14.4 g/dL (ref 11.1–15.9)
MCH: 31.1 pg (ref 26.6–33.0)
MCHC: 34.4 g/dL (ref 31.5–35.7)
MCV: 91 fL (ref 79–97)
Platelets: 215 10*3/uL (ref 150–450)
RBC: 4.63 x10E6/uL (ref 3.77–5.28)
RDW: 13.9 % (ref 11.7–15.4)
WBC: 7 10*3/uL (ref 3.4–10.8)

## 2019-08-11 LAB — HEMOGLOBIN A1C
Est. average glucose Bld gHb Est-mCnc: 123 mg/dL
Hgb A1c MFr Bld: 5.9 % — ABNORMAL HIGH (ref 4.8–5.6)

## 2019-08-11 LAB — LIPID PANEL
Chol/HDL Ratio: 3.7 ratio (ref 0.0–4.4)
Cholesterol, Total: 172 mg/dL (ref 100–199)
HDL: 47 mg/dL (ref >39)
LDL Chol Calc (NIH): 101 mg/dL — ABNORMAL HIGH (ref 0–99)
Triglycerides: 134 mg/dL (ref 0–149)
VLDL Cholesterol Cal: 24 mg/dL (ref 5–40)

## 2019-08-11 LAB — TSH: TSH: 0.965 u[IU]/mL (ref 0.450–4.500)

## 2019-08-14 LAB — IGP, APTIMA HPV: HPV Aptima: NEGATIVE

## 2019-08-16 ENCOUNTER — Encounter: Payer: Self-pay | Admitting: Family Medicine

## 2019-08-17 IMAGING — US US OB < 14 WEEKS - US OB TV
1 series · 15 of 28 positions shown · non-contrast
Comparison: No prior scans from this gestation.

CLINICAL DATA: 31-year-old pregnant female with abdominal pain.
Quantitative beta HCG pending.

EDC by LMP: 08/03/2018, projecting to an expected gestational age of
4 weeks 6 days
EXAM:
OBSTETRIC <14 WK US AND TRANSVAGINAL OB US
TECHNIQUE: Both transabdominal and transvaginal ultrasound examinations were
performed for complete evaluation of the gestation as well as the
maternal uterus, adnexal regions, and pelvic cul-de-sac.
Transvaginal technique was performed to assess early pregnancy.

[Series 1: us ob < 14 weeks - us ob tv · 33 acquisitions, 15 frames shown]
[im 1/33]
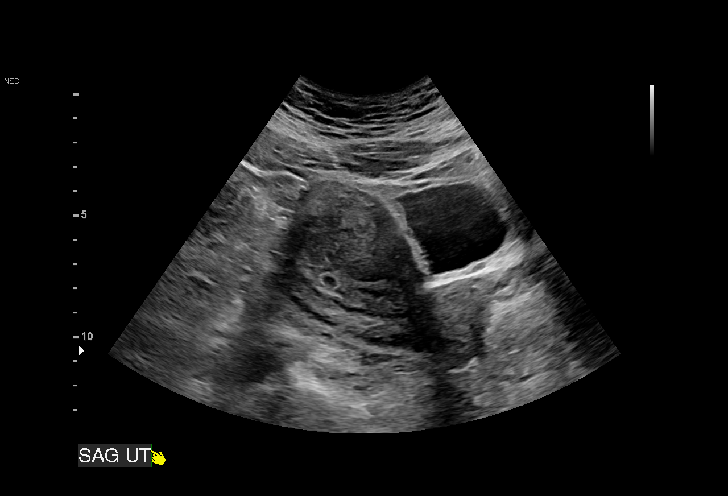
[im 3/33]
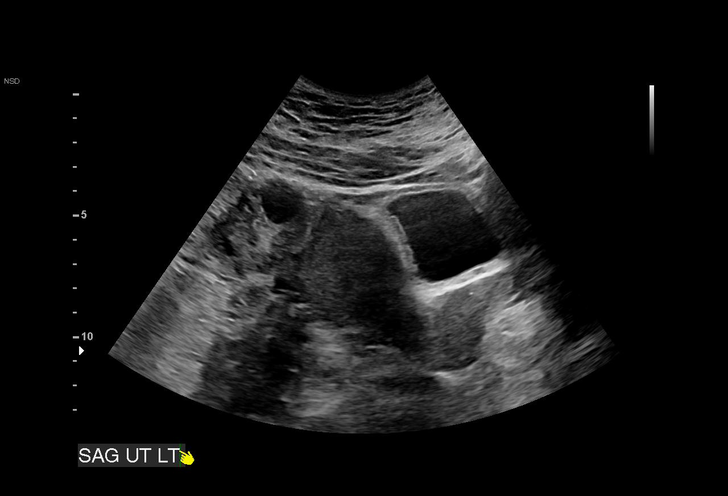
[im 5/33]
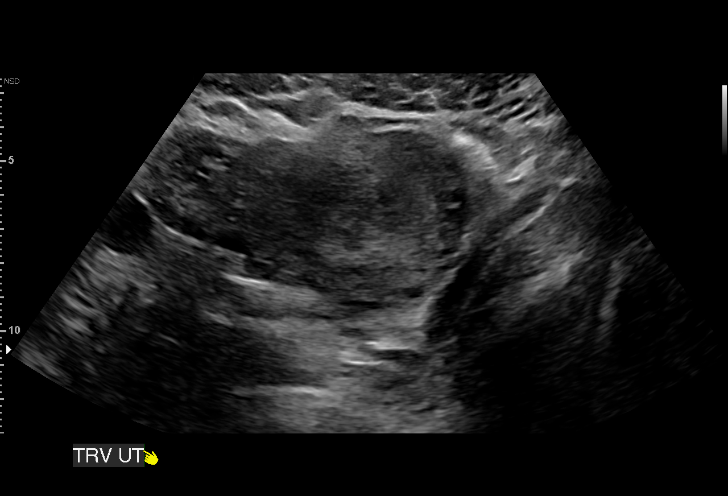
[im 8/33]
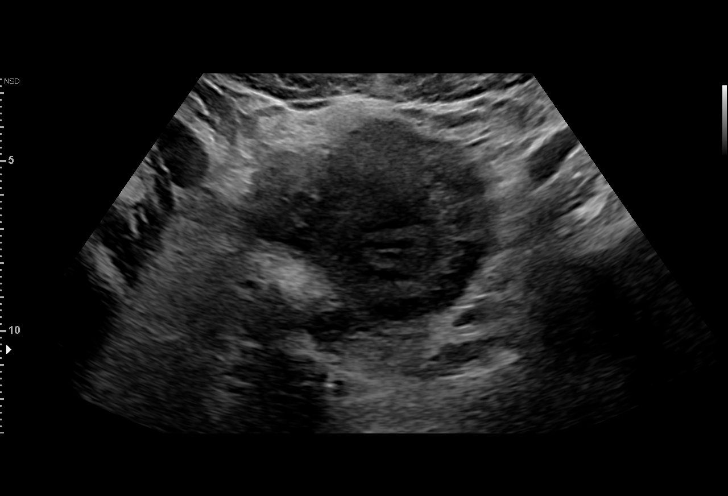
[im 10/33]
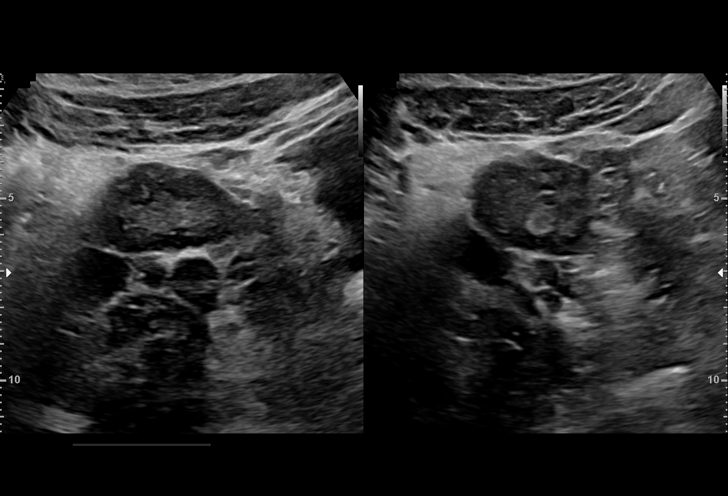
[im 12/33]
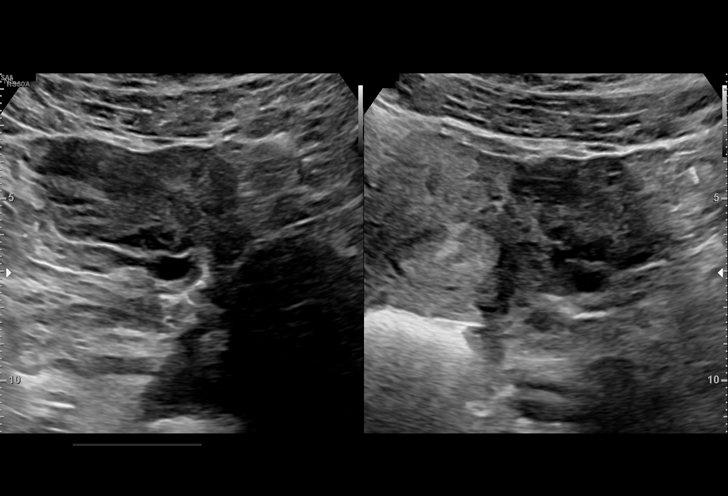
[im 15/33]
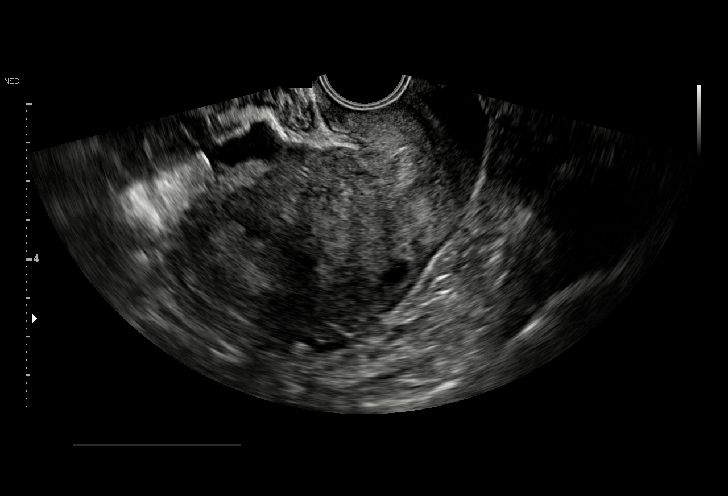
[im 17/33]
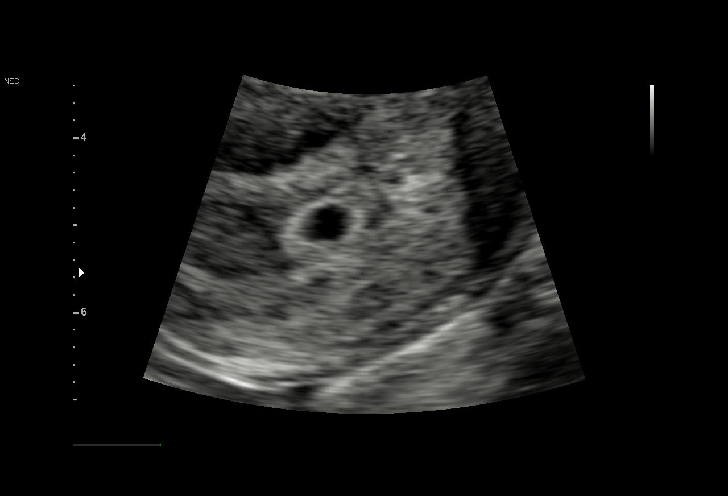
[im 18/33]
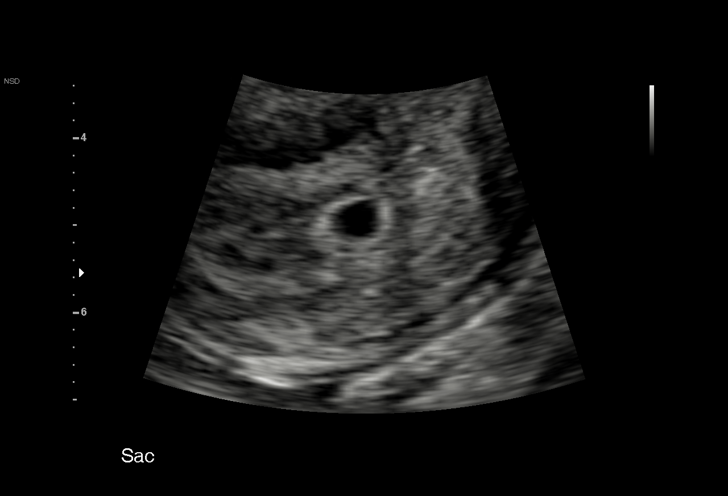
[im 21/33]
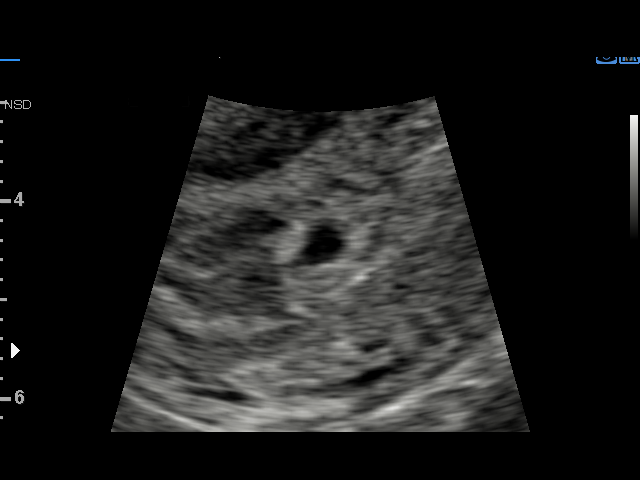
[im 23/33]
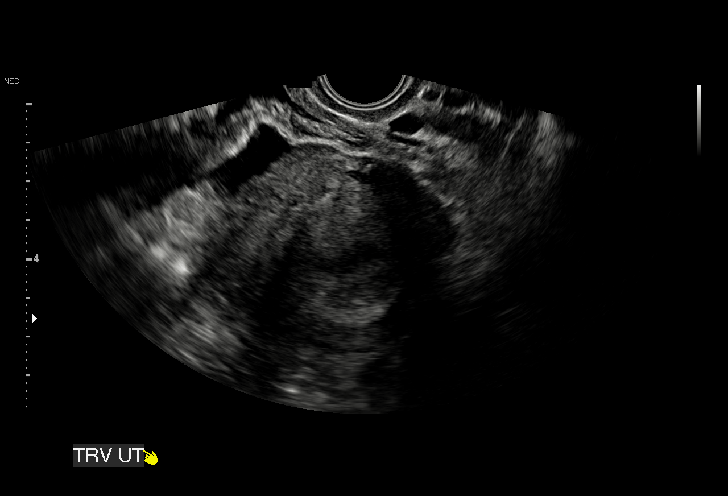
[im 25/33]
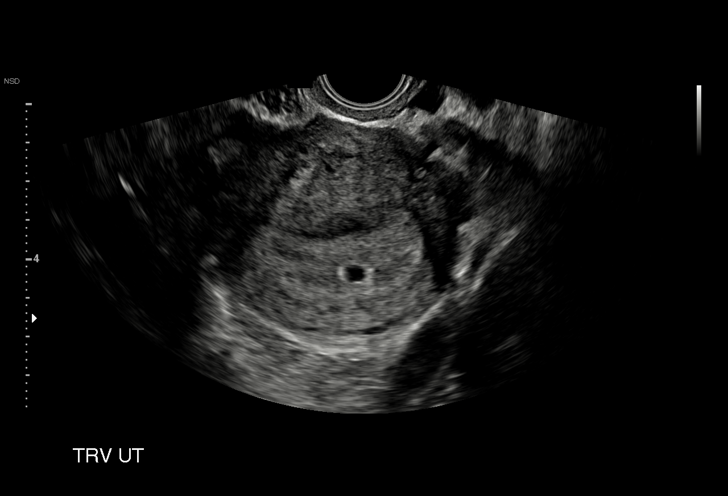
[im 28/33]
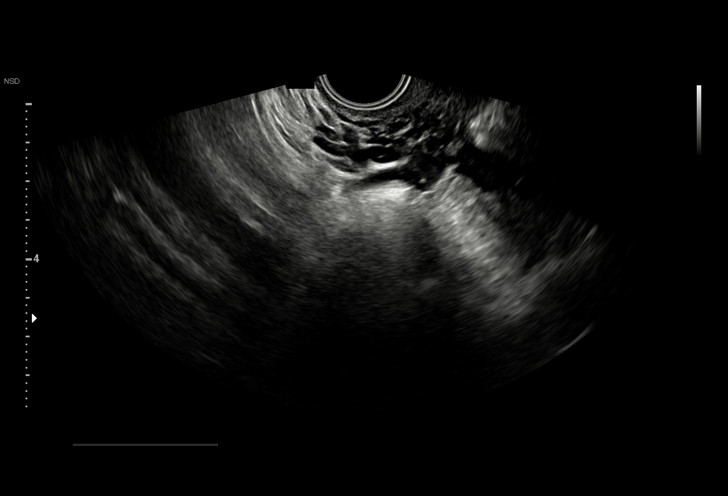
[im 30/33]
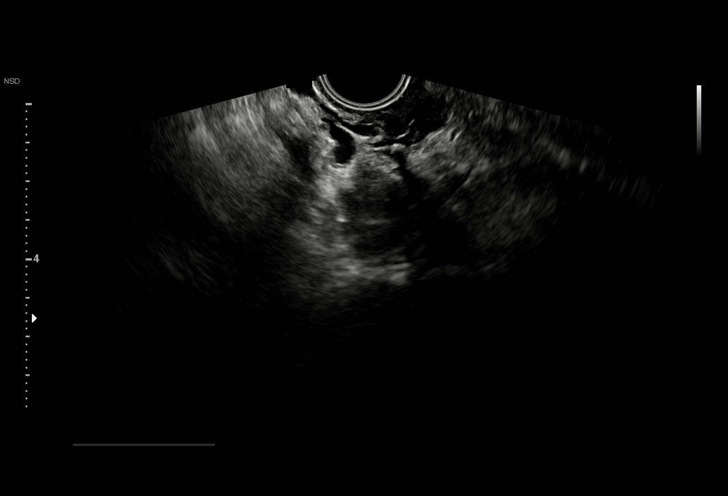
[im 33/33]
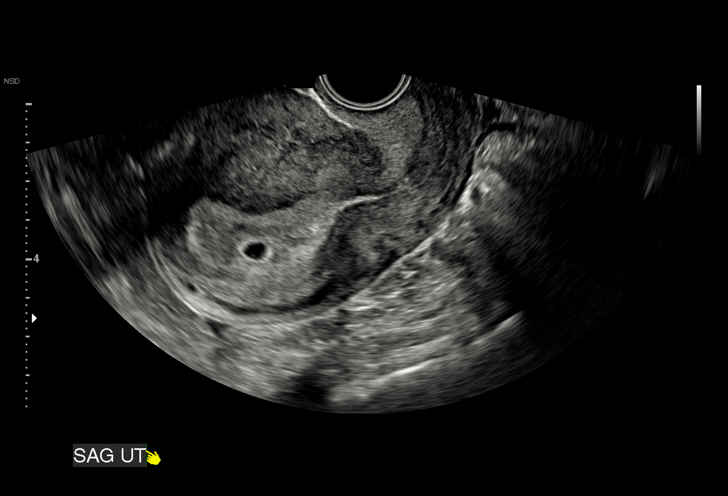

[15 of 28 positions shown; findings below may reference images not displayed]

FINDINGS: Intrauterine gestational sac: Single intrauterine sac-like structure
with double decidual sac sign.

Yolk sac:  Not Visualized.

Embryo:  Not Visualized.

MSD: 4.9 mm   5 w   1 d

Subchorionic hemorrhage:  None visualized.

Maternal uterus/adnexae: Right ovary measures 4.4 x 2.5 x 3.2 cm.
Left ovary measures 5.4 x 2.8 x 3.1 cm. No abnormal ovarian or
adnexal masses. No abnormal free fluid in the pelvis. No uterine
fibroids.
IMPRESSION: Single intrauterine sac-like structure with double decidual sac
sign, probably an early intrauterine gestation, measuring 5 weeks 1
day by mean sac diameter. No definitive features of pregnancy such
as a yolk sac or embryo are visualized, which could be due to early
gestational age. Strictly speaking, occult ectopic gestation remains
on the sonographic differential, although there are no adnexal
masses. Recommend follow-up US in 15-21 days for definitive
diagnosis, or earlier as clinically warranted. This recommendation
follows SRU consensus guidelines: Diagnostic Criteria for Nonviable
Pregnancy Early in the First Trimester. N Engl J Med 7055;

## 2019-08-19 ENCOUNTER — Ambulatory Visit (INDEPENDENT_AMBULATORY_CARE_PROVIDER_SITE_OTHER)
Admission: RE | Admit: 2019-08-19 | Discharge: 2019-08-19 | Disposition: A | Discharge status: Home / Self Care | Payer: Managed Care, Other (non HMO) | Source: Ambulatory Visit

## 2019-08-19 ENCOUNTER — Encounter: Payer: Self-pay | Admitting: Family Medicine

## 2019-08-19 DIAGNOSIS — M545 Low back pain, unspecified: Secondary | ICD-10-CM

## 2019-08-19 DIAGNOSIS — S39012A Strain of muscle, fascia and tendon of lower back, initial encounter: Secondary | ICD-10-CM

## 2019-08-19 MED ORDER — NAPROXEN 500 MG PO TABS: 500.0000 mg | ORAL_TABLET | Freq: Two times a day (BID) | ORAL | 0 refills | Status: DC

## 2019-08-19 MED ORDER — TIZANIDINE HCL 4 MG PO TABS: 4.0000 mg | ORAL_TABLET | Freq: Every evening | ORAL | 0 refills | Status: DC | PRN

## 2019-08-19 NOTE — ED Provider Notes (Signed)
Virtual Visit via Video Note:  Traci Peterson  initiated request for Telemedicine visit with [facility] team. I connected with Julaine Fusi  on 08/19/2019 at 8:26 AM  for a synchronized telemedicine visit using a video enabled HIPPA compliant telemedicine application. I verified that I am speaking with Julaine Fusi  using two identifiers. Wallis Bamberg, PA-C  was physically located in a [facility]gent care site and Selena Swaminathan was located at a different location.   The limitations of evaluation and management by telemedicine as well as the availability of in-person appointments were discussed. Patient was informed that she  may incur a bill ( including co-pay) for this virtual visit encounter. Eleana Crafton  expressed understanding and gave verbal consent to proceed with virtual visit.     History of Present Illness:Traci Peterson  is a 34 y.o. female presents with several day hx of persistent low back pain, tightness worse on the right side. Patient works a third shift, works at [employer]. Has used [employer] without relief. Has tried stretching. Denies falls, trauma, weakness, change in urinary or bowel habits.   ROS  No current facility-administered medications for this encounter.   Current Outpatient Medications  Medication Sig Dispense Refill  . terbinafine (LAMISIL) 250 MG tablet Take 1 tablet (250 mg total) by mouth daily. 14 tablet 0     Allergies  Allergen Reactions  . Lactose Intolerance (Gi) Diarrhea     Past Medical History:  Diagnosis Date  . Anxiety    no current med.  . Cervix prolapsed into vagina   . Chronic headaches    otc med prn  . Complication of anesthesia    narrow airway and had difficulty breathing after cholecystectomy  . Constipation   . Depression    no meds currently  . Diabetes mellitus without complication ([facility])    type 2  . Gall stones    Resolved with surgery  . GERD (gastroesophageal reflux disease)   .  Gestational diabetes   . HPV in female   . Jaw snapping    states jaw pops if opens mouth too wide  . Ovarian cyst   . PCOS (polycystic ovarian syndrome)   . Pilonidal cyst 02/2013  . Preterm labor 2016   PPROM @ 18 wks, delivery of IUFD @ 21 wks  . Seasonal allergies   . SVD (spontaneous vaginal delivery)    x 2 - both fetal demises 2nd trim.  . Vaginal Pap smear, abnormal     Past Surgical History:  Procedure Laterality Date  . CERVICAL CERCLAGE N/A 01/21/2018   Procedure: CERCLAGE CERVICAL;  Surgeon: Lesly Dukes, MD;  Location: [facility];  Service: Gynecology;  Laterality: N/A;  . CERVICAL CERCLAGE  07/09/2018   Procedure: CERCLAGE CERVICAL removal;  Surgeon: Tereso Newcomer, MD;  Location: [facility];  Service: Obstetrics;;  after c-section cerclage removed   . CESAREAN SECTION N/A 07/09/2018   Procedure: CESAREAN SECTION;  Surgeon: Tereso Newcomer, MD;  Location: [facility];  Service: Obstetrics;  Laterality: N/A;  . CHOLECYSTECTOMY N/A 07/21/2016   Procedure: LAPAROSCOPIC CHOLECYSTECTOMY;  Surgeon: Abigail Miyamoto, MD;  Location: [facility];  Service: General;  Laterality: N/A;  . PILONIDAL CYST EXCISION N/A 03/12/2013   Procedure: CYST EXCISION PILONIDAL EXTENSIVE;  Surgeon: Shelly Rubenstein, MD;  Location: [facility];  Service: General;  Laterality: N/A;  . WISDOM TOOTH EXTRACTION        Observations/Objective: Physical Exam Constitutional:  General: She is not in acute distress.    Appearance: Normal appearance. She is well-developed. She is not ill-appearing, toxic-appearing or diaphoretic.  Eyes:     Extraocular Movements: Extraocular movements intact.  Pulmonary:     Effort: Pulmonary effort is normal.  Neurological:     General: No focal deficit present.     Mental Status: She is alert and oriented to person, place, and time.  Psychiatric:        Mood and Affect: Mood normal.        Behavior: Behavior normal.         Thought Content: Thought content normal.        Judgment: Judgment normal.      Assessment and Plan:  1. Acute right-sided low back pain without sciatica   2. Lumbar strain, initial encounter    Will use conservative management for a low back strain.  Patient is to start naproxen together with Zanaflex.  Recommended daily adequate hydration, counseled on back care. Counseled patient on potential for adverse effects with medications prescribed/recommended today, ER and return-to-clinic precautions discussed, patient verbalized understanding.    Follow Up Instructions:    I discussed the assessment and treatment plan with the patient. The patient was provided an opportunity to ask questions and all were answered. The patient agreed with the plan and demonstrated an understanding of the instructions.   The patient was advised to call back or seek an in-person evaluation if the symptoms worsen or if the condition fails to improve as anticipated.  I provided 10 minutes of non-face-to-face time during this encounter.    Jaynee Eagles, PA-C  08/19/2019 8:26 AM         Jaynee Eagles, PA-C 08/19/19 [phone]

## 2019-09-09 ENCOUNTER — Encounter (HOSPITAL_BASED_OUTPATIENT_CLINIC_OR_DEPARTMENT_OTHER): Payer: Self-pay | Admitting: Emergency Medicine

## 2019-09-09 ENCOUNTER — Other Ambulatory Visit: Payer: Self-pay

## 2019-09-09 ENCOUNTER — Emergency Department (HOSPITAL_BASED_OUTPATIENT_CLINIC_OR_DEPARTMENT_OTHER)
Admission: EM | Admit: 2019-09-09 | Discharge: 2019-09-09 | Disposition: A | Discharge status: Home / Self Care | Payer: Managed Care, Other (non HMO) | Attending: Emergency Medicine | Admitting: Emergency Medicine

## 2019-09-09 DIAGNOSIS — L0501 Pilonidal cyst with abscess: Secondary | ICD-10-CM | POA: Diagnosis not present

## 2019-09-09 DIAGNOSIS — L0591 Pilonidal cyst without abscess: Secondary | ICD-10-CM

## 2019-09-09 DIAGNOSIS — K6289 Other specified diseases of anus and rectum: Secondary | ICD-10-CM | POA: Diagnosis present

## 2019-09-09 DIAGNOSIS — F17210 Nicotine dependence, cigarettes, uncomplicated: Secondary | ICD-10-CM | POA: Insufficient documentation

## 2019-09-09 LAB — PREGNANCY, URINE: Preg Test, Ur: NEGATIVE

## 2019-09-09 MED ORDER — ACETAMINOPHEN 325 MG PO TABS
650.0000 mg | ORAL_TABLET | Freq: Once | ORAL | Status: AC
  Administered 2019-09-09: 650 mg via ORAL
  Filled 2019-09-09: qty 2

## 2019-09-09 MED ORDER — LIDOCAINE-EPINEPHRINE (PF) 2 %-1:200000 IJ SOLN
10.0000 mL | Freq: Once | INTRAMUSCULAR | Status: AC
  Administered 2019-09-09: 10 mL via INTRADERMAL
  Filled 2019-09-09: qty 10

## 2019-09-09 MED ORDER — CEPHALEXIN 500 MG PO CAPS: 500.0000 mg | ORAL_CAPSULE | Freq: Two times a day (BID) | ORAL | 0 refills | Status: AC

## 2019-09-09 MED ORDER — HYDROCODONE-ACETAMINOPHEN 5-325 MG PO TABS: 1.0000 | ORAL_TABLET | Freq: Four times a day (QID) | ORAL | 0 refills | Status: DC | PRN

## 2019-09-09 MED FILL — HYDROCODON-APAP 5-325: 5-325 | 2 days supply | Qty: 6 | Fill #0

## 2019-09-09 MED FILL — CEPHALEXIN 500 MG CAPSULE: 500 | 7 days supply | Qty: 14 | Fill #0

## 2019-09-09 NOTE — ED Triage Notes (Signed)
Pt has pilonidal cyst that has returned since one week ago.  Pt states it will not drain and seems to be worsening.  No fevers at home

## 2019-09-09 NOTE — ED Provider Notes (Signed)
[facility] EMERGENCY DEPARTMENT Provider Note   CSN: [id number] Arrival date & time: 09/09/19  0857     History Chief Complaint  Patient presents with  . Cyst    Traci Peterson is a 34 y.o. female who presents for evaluation of pain, swelling, redness noted to the pilonidal area x1 week.  She reports she has a history of pilonidal abscess and states that this feels similar.  She reports last time she had it was about a year ago.  She had followed up with a surgeon and had it surgically removed.  She states she had not been having any symptoms until last week.  She states that area is painful and hurts worse with trying to sit.  She has not noted any fevers.  The history is provided by the patient.       Past Medical History:  Diagnosis Date  . Anxiety    no current med.  . Cervix prolapsed into vagina   . Chronic headaches    otc med prn  . Complication of anesthesia    narrow airway and had difficulty breathing after cholecystectomy  . Constipation   . Depression    no meds currently  . Diabetes mellitus without complication (HCC)    type 2  . Gall stones    Resolved with surgery  . GERD (gastroesophageal reflux disease)   . Gestational diabetes   . HPV in female   . Jaw snapping    states jaw pops if opens mouth too wide  . Ovarian cyst   . PCOS (polycystic ovarian syndrome)   . Pilonidal cyst 02/2013  . Preterm labor 2016   PPROM @ 18 wks, delivery of IUFD @ 21 wks  . Seasonal allergies   . SVD (spontaneous vaginal delivery)    x 2 - both fetal demises 2nd trim.  . Vaginal Pap smear, abnormal     Patient Active Problem List   Diagnosis Date Noted  . Prediabetes 08/11/2019  . Breast pain, right 07/23/2018  . Insulin controlled gestational diabetes mellitus (GDM) during pregnancy 05/08/2018  . Heartburn in pregnancy 02/13/2018  . Trichomonal vaginitis during pregnancy 01/20/2018  . Supervision of high risk pregnancy, antepartum 01/01/2018  .  Pre-existing type 2 diabetes mellitus during pregnancy, antepartum 12/16/2017  . Vitamin D deficiency 12/10/2017  . MDD (major depressive disorder), recurrent episode, severe (HCC) 05/29/2017  . Incompetent cervix 09/12/2016  . Gall stones 07/19/2016  . Family history of breast cancer in female 06/06/2016  . Nexplanon insertion 08/08/2015  . Obesity in pregnancy 10/25/2014  . Eczema 10/25/2014  . 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 Chronic headaches -  no meds 10/25/2014  . Anxiety and depression 11/30/2013  . PCOS (polycystic ovarian syndrome) 04/20/2013    Past Surgical History:  Procedure Laterality Date  . CERVICAL CERCLAGE N/A 01/21/2018   Procedure: CERCLAGE CERVICAL;  Surgeon: Lesly Dukes, MD;  Location: [facility]TH[facility] SU[facility];  Service: Gynecology;  Laterality: N/A;  . CERVICAL CERCLAGE  07/09/2018   Procedure: CERCLAGE CERVICAL removal;  Surgeon: Tereso Newcomer, MD;  Location: [facility]TH[facility] SU[facility];  Service: Obstetrics;;  after c-section cerclage removed   . CESAREAN SECTION N/A 07/09/2018   Procedure: CESAREAN SECTION;  Surgeon: Tereso Newcomer, MD;  Location: [facility]TH[facility];  Service: Obstetrics;  Laterality: N/A;  . CHOLECYSTECTOMY N/A 07/21/2016   Procedure: LAPAROSCOPIC CHOLECYSTECTOMY;  Surgeon: Abigail Miyamoto,  MD;  Location: [facility] OR;  Service: General;  Laterality: N/A;  . PILONIDAL CYST EXCISION N/A 03/12/2013   Procedure: CYST EXCISION PILONIDAL EXTENSIVE;  Surgeon: Shelly Rubenstein, MD;  Location: [facility];  Service: General;  Laterality: N/A;  . WISDOM TOOTH EXTRACTION       OB History    Gravida  3   Para  2   Term  0   Preterm  2   AB  0   Living  0     SAB  0   TAB  0   Ectopic  0   Multiple  1   Live Births  2           Family History  Problem Relation Age of Onset  . Hypertension Mother   . Hyperlipidemia Mother   . Diabetes  Mother   . Thyroid disease Mother   . Diabetes Father   . Hypertension Father   . Alcohol abuse Father   . Breast cancer Maternal Aunt   . Pancreatic cancer Paternal Grandfather     Social History   Tobacco Use  . Smoking status: Current Some Day Smoker    Packs/day: 0.25    Years: 12.00    Pack years: 3.00    Types: Cigarettes    Last attempt to quit: 01/10/2018    Years since quitting: 1.6  . Smokeless tobacco: Never Used  Substance Use Topics  . Alcohol use: No    Comment: socially but none with pregnancy  . Drug use: Not Currently    Types: Hydrocodone    Comment: prescribed this medication    Home Medications Prior to Admission medications   Medication Sig Start Date End Date Taking? Authorizing Provider  cephALEXin (KEFLEX) 500 MG capsule Take 1 capsule (500 mg total) by mouth 2 (two) times daily for 7 days. 09/09/19 09/16/19  Maxwell Caul, PA-C  HYDROcodone-acetaminophen (NORCO/VICODIN) 5-325 MG tablet Take 1-2 tablets by mouth every 6 (six) hours as needed. 09/09/19   Maxwell Caul, PA-C  naproxen (NAPROSYN) 500 MG tablet Take 1 tablet (500 mg total) by mouth 2 (two) times daily with a meal. 08/19/19   Wallis Bamberg, PA-C  terbinafine (LAMISIL) 250 MG tablet Take 1 tablet (250 mg total) by mouth daily. 08/10/19   Copland, Gwenlyn Found, MD  tiZANidine (ZANAFLEX) 4 MG tablet Take 1 tablet (4 mg total) by mouth at bedtime as needed for muscle spasms. 08/19/19   Wallis Bamberg, PA-C    Allergies    Lactose intolerance (gi)  Review of Systems   Review of Systems  Constitutional: Negative for fever.  Skin: Positive for color change and wound.  All other systems reviewed and are negative.   Physical Exam Updated Vital Signs BP 131/67 (BP Location: Right Arm)   Pulse 77   Temp 98.3 F (36.8 C) (Oral)   Resp 16   Ht 5\' 2"  (1.575 m)   Wt 119.7 kg   LMP 06/11/2019   SpO2 100%   BMI 48.29 kg/m   Physical Exam Vitals and nursing note reviewed.  Constitutional:       Appearance: She is well-developed.  HENT:     Head: Normocephalic and atraumatic.  Eyes:     General: No scleral icterus.       Right eye: No discharge.        Left eye: No discharge.     Conjunctiva/sclera: Conjunctivae normal.  Pulmonary:     Effort: Pulmonary effort  is normal.  Genitourinary:      Comments: The exam was performed with a chaperone present. She has a small 2cm area of erythema, warmth with some mild central fluctuance.  Skin:    General: Skin is warm and dry.  Neurological:     Mental Status: She is alert.  Psychiatric:        Speech: Speech normal.        Behavior: Behavior normal.     ED Results / Procedures / Treatments   Labs (all labs ordered are listed, but only abnormal results are displayed) Labs Reviewed  PREGNANCY, URINE    EKG None  Radiology No results found.  Procedures .Marland KitchenIncision and Drainage  Date/Time: 09/09/2019 10:43 AM Performed by: Volanda Napoleon, PA-C Authorized by: Volanda Napoleon, PA-C   Consent:    Consent obtained:  Verbal   Consent given by:  Patient   Risks discussed:  Bleeding, incomplete drainage, pain and damage to other organs   Alternatives discussed:  No treatment Universal protocol:    Procedure explained and questions answered to patient or proxy's satisfaction: yes     Relevant documents present and verified: yes     Test results available and properly labeled: yes     Imaging studies available: yes     Required blood products, implants, devices, and special equipment available: yes     Site/side marked: yes     Immediately prior to procedure a time out was called: yes     Patient identity confirmed:  Verbally with patient Location:    Type:  Cyst   Location:  Anogenital   Anogenital location:  Pilonidal Pre-procedure details:    Skin preparation:  Betadine Anesthesia (see MAR for exact dosages):    Anesthesia method:  Local infiltration   Local anesthetic:  Lidocaine 2% WITH epi Procedure  type:    Complexity:  Complex Procedure details:    Incision types:  Single straight   Incision depth:  Subcutaneous   Scalpel blade:  11   Wound management:  Probed and deloculated, irrigated with saline and extensive cleaning   Drainage:  Purulent   Drainage amount:  Scant   Wound treatment:  Wound left open Post-procedure details:    Patient tolerance of procedure:  Tolerated well, no immediate complications   (including critical care time)  Medications Ordered in ED Medications  lidocaine-EPINEPHrine (XYLOCAINE W/EPI) 2 %-1:200000 (PF) injection 10 mL (10 mLs Intradermal Given by Other 09/09/19 0954)  acetaminophen (TYLENOL) tablet 650 mg (650 mg Oral Given 09/09/19 1043)    ED Course  I have reviewed the triage vital signs and the nursing notes.  Pertinent labs & imaging results that were available during my care of the patient were reviewed by me and considered in my medical decision making (see chart for details).    MDM Rules/Calculators/A&P                      34 year old female who presents for evaluation of pain, swelling, redness noted to the pilonidal area x1 week.  History of pilonidal abscesses and states this feels the same.  No fevers. Patient is afebrile, non-toxic appearing, sitting comfortably on examination table. Vital signs reviewed and stable.  On exam, she has an area of warmth, erythema with slight central fluctuance noted to the right pilonidal area.  Exam consistent with pilonidal abscess.  Bedside ultrasound applied.  He did show a small superficial area that appeared to be fluid collection  that measured about 1.5 cm x 2 cm.  I discussed treatment options with patient.  Patient is agreeable to I&D here in the ED.  EMERGENCY DEPARTMENT US SOFT TISSUE INTERPRETATION "Study: Limited Soft Tissue Ultrasound"  INDICATIONS: Pain and Soft tissue infection Multiple views of the body part were obtained in real-time with a multi-frequency linear probe  PERFORMED  BY: Myself IMAGES ARCHIVED?: Yes SIDE:Right  BODY PART:Pilonidal INTERPRETATION:  Abcess present   I&D as documented above.  Patient tolerated procedure well.  There was a small little abscess consistent with what I saw on the ultrasound.  No deep pocket of infection that would require packing.  I discussed with patient that the cyst pocket may have returned and that she will need to follow-up with her general surgeon.  We will plan to put her on antibiotics.  Patient with no known drug allergies.  Patient given short course of pain medication for acute pain. At this time, patient exhibits no emergent life-threatening condition that require further evaluation in ED or admission. Patient had ample opportunity for questions and discussion. All patient's questions were answered with full understanding. Strict return precautions discussed. Patient expresses understanding and agreement to plan.   Portions of this note were generated with Scientist, clinical (histocompatibility and immunogenetics). Dictation errors may occur despite best attempts at proofreading.  Final Clinical Impression(s) / ED Diagnoses Final diagnoses:  Pilonidal cyst    Rx / DC Orders ED Discharge Orders         Ordered    cephALEXin (KEFLEX) 500 MG capsule  2 times daily     09/09/19 1042    HYDROcodone-acetaminophen (NORCO/VICODIN) 5-325 MG tablet  Every 6 hours PRN     09/09/19 1042           Maxwell Caul, PA-C 09/09/19 1143    Little, Ambrose Finland, MD 09/09/19 1452

## 2019-09-09 NOTE — ED Notes (Signed)
Pt to bathroom to provide urine specimen

## 2019-09-09 NOTE — Discharge Instructions (Signed)
Apply warm compresses to the area or soak the area in warm water to help continue express drainage.   Keep the wound clean and dry. Gently wash the wound with soap and water and make sure to pat it dry.   Take antibiotic and complete the entire course.   You can take Tylenol or Ibuprofen as directed for pain.  Take pain medications as directed for break through pain. Do not drive or operate machinery while taking this medication.   Follow up with the referred surgeon for further evaluation.   Return to the Emergency Department if you experienced any worsening/spreading redness or swelling, fever, worsening pain, or any other worsening or concerning symptoms.

## 2019-09-21 ENCOUNTER — Encounter: Payer: Self-pay | Admitting: Family Medicine

## 2019-09-21 DIAGNOSIS — B353 Tinea pedis: Secondary | ICD-10-CM

## 2019-09-21 MED ORDER — TERBINAFINE HCL 250 MG PO TABS: 250.0000 mg | ORAL_TABLET | Freq: Every day | ORAL | 0 refills | Status: DC

## 2019-11-24 ENCOUNTER — Emergency Department (HOSPITAL_BASED_OUTPATIENT_CLINIC_OR_DEPARTMENT_OTHER): Payer: Managed Care, Other (non HMO)

## 2019-11-24 ENCOUNTER — Emergency Department (HOSPITAL_BASED_OUTPATIENT_CLINIC_OR_DEPARTMENT_OTHER)
Admission: EM | Admit: 2019-11-24 | Discharge: 2019-11-24 | Disposition: A | Discharge status: Home / Self Care | Payer: Managed Care, Other (non HMO) | Attending: Emergency Medicine | Admitting: Emergency Medicine

## 2019-11-24 ENCOUNTER — Other Ambulatory Visit: Payer: Self-pay

## 2019-11-24 ENCOUNTER — Encounter (HOSPITAL_BASED_OUTPATIENT_CLINIC_OR_DEPARTMENT_OTHER): Payer: Self-pay | Admitting: Emergency Medicine

## 2019-11-24 DIAGNOSIS — Y999 Unspecified external cause status: Secondary | ICD-10-CM | POA: Insufficient documentation

## 2019-11-24 DIAGNOSIS — Y92009 Unspecified place in unspecified non-institutional (private) residence as the place of occurrence of the external cause: Secondary | ICD-10-CM | POA: Insufficient documentation

## 2019-11-24 DIAGNOSIS — W228XXA Striking against or struck by other objects, initial encounter: Secondary | ICD-10-CM | POA: Insufficient documentation

## 2019-11-24 DIAGNOSIS — E119 Type 2 diabetes mellitus without complications: Secondary | ICD-10-CM | POA: Insufficient documentation

## 2019-11-24 DIAGNOSIS — Y9389 Activity, other specified: Secondary | ICD-10-CM | POA: Diagnosis not present

## 2019-11-24 DIAGNOSIS — S93602A Unspecified sprain of left foot, initial encounter: Secondary | ICD-10-CM | POA: Diagnosis not present

## 2019-11-24 DIAGNOSIS — F17210 Nicotine dependence, cigarettes, uncomplicated: Secondary | ICD-10-CM | POA: Insufficient documentation

## 2019-11-24 DIAGNOSIS — S99922A Unspecified injury of left foot, initial encounter: Secondary | ICD-10-CM | POA: Diagnosis present

## 2019-11-24 NOTE — ED Notes (Signed)
ED Provider at bedside. 

## 2019-11-24 NOTE — ED Provider Notes (Signed)
[facility] EMERGENCY DEPARTMENT Provider Note   CSN: [id number] Arrival date & time: 11/24/19  4259     History Chief Complaint  Patient presents with  . Foot Pain    Traci Peterson is a 34 y.o. female.  HPI Patient presents with left foot pain.  States that yesterday she stepped on a rock in some flip-flops at home.  With no support.  States that she did not hurt at the time but shortly after developed pain in the left foot.  Had a swollen last night.  Took some naproxen at home.  Today continued pain.  Is both on the "roof" of the foot and in the heel area.  Pain increases with walking.  No numbness or weakness.  No other injury or pain.  No fevers or chills.    Past Medical History:  Diagnosis Date  . Anxiety    no current med.  . Cervix prolapsed into vagina   . Chronic headaches    otc med prn  . Complication of anesthesia    narrow airway and had difficulty breathing after cholecystectomy  . Constipation   . Depression    no meds currently  . Diabetes mellitus without complication (HCC)    type 2  . Gall stones    Resolved with surgery  . GERD (gastroesophageal reflux disease)   . Gestational diabetes   . HPV in female   . Jaw snapping    states jaw pops if opens mouth too wide  . Ovarian cyst   . PCOS (polycystic ovarian syndrome)   . Pilonidal cyst 02/2013  . Preterm labor 2016   PPROM @ 18 wks, delivery of IUFD @ 21 wks  . Seasonal allergies   . SVD (spontaneous vaginal delivery)    x 2 - both fetal demises 2nd trim.  . Vaginal Pap smear, abnormal     Patient Active Problem List   Diagnosis Date Noted  . Prediabetes 08/11/2019  . Breast pain, right 07/23/2018  . Insulin controlled gestational diabetes mellitus (GDM) during pregnancy 05/08/2018  . Heartburn in pregnancy 02/13/2018  . Trichomonal vaginitis during pregnancy 01/20/2018  . Supervision of high risk pregnancy, antepartum 01/01/2018  . Pre-existing type 2 diabetes mellitus  during pregnancy, antepartum 12/16/2017  . Vitamin D deficiency 12/10/2017  . MDD (major depressive disorder), recurrent episode, severe (HCC) 05/29/2017  . Incompetent cervix 09/12/2016  . Gall stones 07/19/2016  . Family history of breast cancer in female 06/06/2016  . Nexplanon insertion 08/08/2015  . Obesity in pregnancy 10/25/2014  . Eczema 10/25/2014  . 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 Chronic headaches -  no meds 10/25/2014  . Anxiety and depression 11/30/2013  . PCOS (polycystic ovarian syndrome) 04/20/2013    Past Surgical History:  Procedure Laterality Date  . CERVICAL CERCLAGE N/A 01/21/2018   Procedure: CERCLAGE CERVICAL;  Surgeon: Lesly Dukes, MD;  Location: [facility] SU[facility];  Service: Gynecology;  Laterality: N/A;  . CERVICAL CERCLAGE  07/09/2018   Procedure: CERCLAGE CERVICAL removal;  Surgeon: Tereso Newcomer, MD;  Location: [facility];  Service: Obstetrics;;  after c-section cerclage removed   . CESAREAN SECTION N/A 07/09/2018   Procedure: CESAREAN SECTION;  Surgeon: Tereso Newcomer, MD;  Location: [facility];  Service: Obstetrics;  Laterality: N/A;  . CHOLECYSTECTOMY N/A 07/21/2016   Procedure: LAPAROSCOPIC CHOLECYSTECTOMY;  Surgeon: Abigail Miyamoto, MD;  Location: [facility] OR;  Service: General;  Laterality: N/A;  . PILONIDAL CYST EXCISION N/A 03/12/2013   Procedure: CYST EXCISION PILONIDAL EXTENSIVE;  Surgeon: Shelly Rubenstein, MD;  Location: [facility];  Service: General;  Laterality: N/A;  . WISDOM TOOTH EXTRACTION       OB History    Gravida  3   Para  2   Term  0   Preterm  2   AB  0   Living  0     SAB  0   TAB  0   Ectopic  0   Multiple  1   Live Births  2           Family History  Problem Relation Age of Onset  . Hypertension Mother   . Hyperlipidemia Mother   . Diabetes Mother   . Thyroid disease Mother     . Diabetes Father   . Hypertension Father   . Alcohol abuse Father   . Breast cancer Maternal Aunt   . Pancreatic cancer Paternal Grandfather     Social History   Tobacco Use  . Smoking status: Current Some Day Smoker    Packs/day: 0.25    Years: 12.00    Pack years: 3.00    Types: Cigarettes    Last attempt to quit: 01/10/2018    Years since quitting: 1.8  . Smokeless tobacco: Never Used  Vaping Use  . Vaping Use: Never used  Substance Use Topics  . Alcohol use: No    Comment: socially but none with pregnancy  . Drug use: Not Currently    Types: Hydrocodone    Comment: prescribed this medication    Home Medications Prior to Admission medications   Not on File    Allergies    Lactose intolerance (gi)  Review of Systems   Review of Systems  Constitutional: Negative for fever.  Musculoskeletal:       Left foot pain.  Skin: Negative for wound.  Neurological: Negative for weakness and numbness.    Physical Exam Updated Vital Signs BP 122/72 (BP Location: Left Arm)   Pulse 93   Temp 98.5 F (36.9 C) (Oral)   Resp 16   Ht 5\' 2"  (1.575 m)   Wt 126.9 kg   LMP 09/27/2019   SpO2 100%   BMI 51.18 kg/m   Physical Exam Vitals reviewed.  HENT:     Head: Normocephalic.  Cardiovascular:     Rate and Rhythm: Normal rate.  Musculoskeletal:     Cervical back: Neck supple.     Comments: Tenderness to forefoot of left foot.  Mild edema but no real skin changes.  No tenderness over Achilles.  Good flexion and extension at the ankle.  Pulse intact.  Foot is not cool.  Skin:    Capillary Refill: Capillary refill takes less than 2 seconds.  Neurological:     Mental Status: She is alert.     ED Results / Procedures / Treatments   Labs (all labs ordered are listed, but only abnormal results are displayed) Labs Reviewed - No data to display  EKG None  Radiology DG Foot Complete Left  Result Date: 11/24/2019 CLINICAL DATA:  Acute left foot pain after injury  yesterday. EXAM: LEFT FOOT - COMPLETE 3+ VIEW COMPARISON:  None. FINDINGS: There is no evidence of fracture or dislocation. There is no evidence of arthropathy or other focal bone abnormality. Soft tissues are unremarkable. IMPRESSION: Negative. Electronically Signed   By: 11/26/2019  M.D.   On: 11/24/2019 10:01    Procedures Procedures (including critical care time)  Medications Ordered in ED Medications - No data to display  ED Course  I have reviewed the triage vital signs and the nursing notes.  Pertinent labs & imaging results that were available during my care of the patient were reviewed by me and considered in my medical decision making (see chart for details).    MDM Rules/Calculators/A&P                          Patient has foot pain after stepping on a rock.  Overall rather benign exam.  X-ray negative.  No other injury.  No fevers.  X-ray reviewed.  Has been given a postop shoe.  Motrin Tylenol help with pain.  Follow-up with sports medicine if symptoms do not improve.  Discharge home from ER. Final Clinical Impression(s) / ED Diagnoses Final diagnoses:  Sprain of left foot, initial encounter    Rx / DC Orders ED Discharge Orders    None       Benjiman Core, MD 11/24/19 1029

## 2019-11-24 NOTE — Discharge Instructions (Signed)
Follow-up with sports medicine as needed.  Motrin or Tylenol may help with the pain.

## 2019-11-24 NOTE — ED Triage Notes (Signed)
Pain to left foot since stepping on a rock yesterday.

## 2019-12-24 LAB — HEMOGLOBIN A1C: Hemoglobin A1C: 6.6

## 2019-12-24 LAB — LIPID PANEL
Cholesterol: 168 (ref 0–200)
HDL: 46 (ref 35–70)
LDL Cholesterol: 96
LDl/HDL Ratio: 3.7
Triglycerides: 150 (ref 40–160)

## 2020-01-04 ENCOUNTER — Encounter: Payer: Self-pay | Admitting: Family Medicine

## 2020-01-12 ENCOUNTER — Emergency Department (HOSPITAL_BASED_OUTPATIENT_CLINIC_OR_DEPARTMENT_OTHER)
Admission: EM | Admit: 2020-01-12 | Discharge: 2020-01-12 | Disposition: A | Discharge status: Home / Self Care | Payer: Managed Care, Other (non HMO) | Attending: Emergency Medicine | Admitting: Emergency Medicine

## 2020-01-12 ENCOUNTER — Emergency Department: Payer: Managed Care, Other (non HMO)

## 2020-01-12 ENCOUNTER — Encounter (HOSPITAL_BASED_OUTPATIENT_CLINIC_OR_DEPARTMENT_OTHER): Payer: Self-pay

## 2020-01-12 ENCOUNTER — Other Ambulatory Visit: Payer: Self-pay

## 2020-01-12 DIAGNOSIS — Z8669 Personal history of other diseases of the nervous system and sense organs: Secondary | ICD-10-CM | POA: Insufficient documentation

## 2020-01-12 DIAGNOSIS — Z5321 Procedure and treatment not carried out due to patient leaving prior to being seen by health care provider: Secondary | ICD-10-CM | POA: Diagnosis not present

## 2020-01-12 DIAGNOSIS — Z23 Encounter for immunization: Secondary | ICD-10-CM

## 2020-01-12 DIAGNOSIS — R519 Headache, unspecified: Secondary | ICD-10-CM | POA: Insufficient documentation

## 2020-01-12 NOTE — Progress Notes (Signed)
   Covid-19 Vaccination Clinic  Name:  Traci Peterson    MRN: [id number] DOB: 1986-05-26  01/12/2020  Traci Peterson was observed post Covid-19 immunization for 15 minutes without incident. She was provided with Vaccine Information Sheet and instruction to access the V-Safe system.   Traci Peterson was instructed to call 911 with any severe reactions post vaccine: Marland Kitchen Difficulty breathing  . Swelling of face and throat  . A fast heartbeat  . A bad rash all over body  . Dizziness and weakness   Immunizations Administered    Name Date Dose VIS Date Route   Moderna COVID-19 Vaccine 01/12/2020 11:15 AM 0.5 mL 04/2019 Intramuscular   Manufacturer: Moderna   Lot: [id number]   NDC: 93790-240-97

## 2020-01-12 NOTE — ED Triage Notes (Signed)
Pt arrives with c/o headache states "it is stress and emotionally induced" pt reports taking tylenol at home, history of migraine.

## 2020-01-19 ENCOUNTER — Ambulatory Visit: Payer: Self-pay

## 2020-02-09 ENCOUNTER — Ambulatory Visit: Payer: Managed Care, Other (non HMO) | Attending: Internal Medicine

## 2020-02-09 DIAGNOSIS — Z23 Encounter for immunization: Secondary | ICD-10-CM

## 2020-02-09 NOTE — Progress Notes (Signed)
   Covid-19 Vaccination Clinic  Name:  Traci Peterson    MRN: [id number] DOB: 11/03/1985  02/09/2020  [name] was observed post Covid-19 immunization for 15 minutes without incident. She was provided with Vaccine Information Sheet and instruction to access the V-Safe system.   [name] was instructed to call 911 with any severe reactions post vaccine: Marland Kitchen Difficulty breathing  . Swelling of face and throat  . A fast heartbeat  . A bad rash all over body  . Dizziness and weakness   Immunizations Administered    Name Date Dose VIS Date Route   Moderna COVID-19 Vaccine 02/09/2020  9:10 AM 0.5 mL 04/2019 Intramuscular   Manufacturer: Moderna   Lot: [id number]   NDC: 97026-378-58

## 2020-02-27 NOTE — Progress Notes (Deleted)
[facility] at [facility] [address] [place], [place] [address] [phone]  Date:  02/29/2020   Name:  Traci Peterson   DOB:  23-Jan-1986   MRN:  [id number]  PCP:  Pearline Cables, MD    Chief Complaint: No chief complaint on file.   History of Present Illness:  Traci Peterson is a 34 y.o. very pleasant female patient who presents with the following:  Patient here today with a few concerns Last seen by myself in March of this year-  History of type 2 diabetes, PCOS, depression, obesity She delivered a baby February 2020-  He passed 3 days after birth with pulmonary HTN and a brain bleed.  His name was DJ.  This loss was especially tough, as it was the first child she has carried to term.  She had 3 previous miscarriages  At her last visit, Gabriel Rung was participating in therapy to work for the loss of her son.  She also has been working on a [employer] and had lost about 20 pounds Family history of breast cancer, plan to start mammogram at age 79 She would like to get her flu shot today A1c in March 5.9% Patient Active Problem List   Diagnosis Date Noted  . Prediabetes 08/11/2019  . Breast pain, right 07/23/2018  . Insulin controlled gestational diabetes mellitus (GDM) during pregnancy 05/08/2018  . Heartburn in pregnancy 02/13/2018  . Trichomonal vaginitis during pregnancy 01/20/2018  . Supervision of high risk pregnancy, antepartum 01/01/2018  . Pre-existing type 2 diabetes mellitus during pregnancy, antepartum 12/16/2017  . Vitamin D deficiency 12/10/2017  . MDD (major depressive disorder), recurrent episode, severe (HCC) 05/29/2017  . Incompetent cervix 09/12/2016  . Gall stones 07/19/2016  . Family history of breast cancer in female 06/06/2016  . Nexplanon insertion 08/08/2015  . Obesity in pregnancy 10/25/2014  . Eczema 10/25/2014  . 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 -- orange, yellow 6 and red food colorings - moderate  to severe respiratory distress 10/25/2014  . Chronic headaches -  no meds 10/25/2014  . Anxiety and depression 11/30/2013  . PCOS (polycystic ovarian syndrome) 04/20/2013    Past Medical History:  Diagnosis Date  . Anxiety    no current med.  . Cervix prolapsed into vagina   . Chronic headaches    otc med prn  . Complication of anesthesia    narrow airway and had difficulty breathing after cholecystectomy  . Constipation   . Depression    no meds currently  . Diabetes mellitus without complication (HCC)    type 2  . Gall stones    Resolved with surgery  . GERD (gastroesophageal reflux disease)   . Gestational diabetes   . HPV in female   . Jaw snapping    states jaw pops if opens mouth too wide  . Ovarian cyst   . PCOS (polycystic ovarian syndrome)   . Pilonidal cyst 02/2013  . Preterm labor 2016   PPROM @ 18 wks, delivery of IUFD @ 21 wks  . Seasonal allergies   . SVD (spontaneous vaginal delivery)    x 2 - both fetal demises 2nd trim.  . Vaginal Pap smear, abnormal     Past Surgical History:  Procedure Laterality Date  . CERVICAL CERCLAGE N/A 01/21/2018   Procedure: CERCLAGE CERVICAL;  Surgeon: Lesly Dukes, MD;  Location: [facility]THING SUIT[facility];  Service: Gynecology;  Laterality: N/A;  . CERVICAL CERCLAGE  07/09/2018  Procedure: CERCLAGE CERVICAL removal;  Surgeon: Tereso Newcomer, MD;  Location: [facility];  Service: Obstetrics;;  after c-section cerclage removed   . CESAREAN SECTION N/A 07/09/2018   Procedure: CESAREAN SECTION;  Surgeon: Tereso Newcomer, MD;  Location: [facility];  Service: Obstetrics;  Laterality: N/A;  . CHOLECYSTECTOMY N/A 07/21/2016   Procedure: LAPAROSCOPIC CHOLECYSTECTOMY;  Surgeon: Abigail Miyamoto, MD;  Location: [facility] OR;  Service: General;  Laterality: N/A;  . PILONIDAL CYST EXCISION N/A 03/12/2013   Procedure: CYST EXCISION PILONIDAL EXTENSIVE;  Surgeon: Shelly Rubenstein, MD;  Location: [facility];   Service: General;  Laterality: N/A;  . WISDOM TOOTH EXTRACTION      Social History   Tobacco Use  . Smoking status: Current Some Day Smoker    Packs/day: 0.25    Years: 12.00    Pack years: 3.00    Types: Cigarettes    Last attempt to quit: 01/10/2018    Years since quitting: 2.1  . Smokeless tobacco: Never Used  Vaping Use  . Vaping Use: Never used  Substance Use Topics  . Alcohol use: No    Comment: socially but none with pregnancy  . Drug use: Not Currently    Types: Hydrocodone    Comment: prescribed this medication    Family History  Problem Relation Age of Onset  . Hypertension Mother   . Hyperlipidemia Mother   . Diabetes Mother   . Thyroid disease Mother   . Diabetes Father   . Hypertension Father   . Alcohol abuse Father   . Breast cancer Maternal Aunt   . Pancreatic cancer Paternal Grandfather     Allergies  Allergen Reactions  . Lactose Intolerance (Gi) Diarrhea    Medication list has been reviewed and updated.  No current outpatient medications on file prior to visit.   No current facility-administered medications on file prior to visit.    Review of Systems:  As per HPI- otherwise negative.   Physical Examination: There were no vitals filed for this visit. There were no vitals filed for this visit. There is no height or weight on file to calculate BMI. Ideal Body Weight:    GEN: no acute distress. HEENT: Atraumatic, Normocephalic.  Ears and Nose: No external deformity. CV: RRR, No M/G/R. No JVD. No thrill. No extra heart sounds. PULM: CTA B, no wheezes, crackles, rhonchi. No retractions. No resp. distress. No accessory muscle use. ABD: S, NT, ND, +BS. No rebound. No HSM. EXTR: No c/c/e PSYCH: Normally interactive. Conversant.    Assessment and Plan: *** This visit occurred during the SARS-CoV-2 public health emergency.  Safety protocols were in place, including screening questions prior to the visit, additional usage of staff PPE,  and extensive cleaning of exam room while observing appropriate contact time as indicated for disinfecting solutions.    Signed Abbe Amsterdam, MD

## 2020-02-29 ENCOUNTER — Ambulatory Visit: Payer: Managed Care, Other (non HMO) | Admitting: Family Medicine

## 2020-03-05 NOTE — Progress Notes (Signed)
[facility] at [facility] [address] [place], [place] [address] [phone]  Date:  03/09/2020   Name:  Traci Peterson   DOB:  04-27-1986   MRN:  [id number]  PCP:  Pearline Cables, MD    Chief Complaint: FOOT FUNGUS BOTH FEET, Referral (TO DERMATOLOGIST), and WANTS SOMETHING TO STOP SMOKING   History of Present Illness:  Traci Peterson is a 34 y.o. very pleasant female patient who presents with the following:  Pt here today for a follow-up visit Last seen by myself in March of this year to establish care- her mother Dedra Skeens is my patient   History of type 2 diabetes, PCOS, depression, obesity She delivered a baby February 2020-  He passed 3 days after birth with pulmonary HTN and a brain bleed.  His name was DJ.  This loss was especially tough, as it was the first child she has carried to term.  She had 3 previous miscarriages She is doing therapy to help process this trauma, she does feel it is helping her be somewhat She has history of GDM or type 2 DM; she is following a keto diet  Lab Results  Component Value Date   HGBA1C 6.6 12/24/2019   Needs flu shot- done today  Foot exam- today Eye exam; this was done in May of this year  Completed moderna covid 19 series just recently  She gained back the weight she lost previously- she "slacked off" keto She is trying to quit smoking as well She has smoked since she was 20, she does about 1.5 packs a week So far to quit she has tried cold [place], nicotine gums, patches- no success so far  She has tried chantix in the past but it caused nightmares  would like to try wellbutrin instead   Pt notes that her depression and ?anxiety has been worse recently.  Her husband was dx with a sex addiction which has caused them a lot of problems  He is seeing a counselor now but Gabriel Rung still is carrying some trauma   She has difficulty with foot fungus- she has tried topical  medications OTC without much success- wonders if we might try an oral anti-fungal medication for her   Wt Readings from Last 3 Encounters:  03/09/20 285 lb (129.3 kg)  01/12/20 268 lb (121.6 kg)  11/24/19 279 lb 12.8 oz (126.9 kg)      Patient Active Problem List   Diagnosis Date Noted  . Prediabetes 08/11/2019  . Breast pain, right 07/23/2018  . Insulin controlled gestational diabetes mellitus (GDM) during pregnancy 05/08/2018  . Heartburn in pregnancy 02/13/2018  . Trichomonal vaginitis during pregnancy 01/20/2018  . Supervision of high risk pregnancy, antepartum 01/01/2018  . Pre-existing type 2 diabetes mellitus during pregnancy, antepartum 12/16/2017  . Vitamin D deficiency 12/10/2017  . MDD (major depressive disorder), recurrent episode, severe (HCC) 05/29/2017  . Incompetent cervix 09/12/2016  . Gall stones 07/19/2016  . Family history of breast cancer in female 06/06/2016  . Nexplanon insertion 08/08/2015  . Obesity in pregnancy 10/25/2014  . Eczema 10/25/2014  . 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 Chronic headaches -  no meds 10/25/2014  . Anxiety and depression 11/30/2013  . PCOS (polycystic ovarian syndrome) 04/20/2013    Past Medical History:  Diagnosis Date  . Anxiety    no current med.  Marland Kitchen  Cervix prolapsed into vagina   . Chronic headaches    otc med prn  . Complication of anesthesia    narrow airway and had difficulty breathing after cholecystectomy  . Constipation   . Depression    no meds currently  . Diabetes mellitus without complication (HCC)    type 2  . Gall stones    Resolved with surgery  . GERD (gastroesophageal reflux disease)   . Gestational diabetes   . HPV in female   . Jaw snapping    states jaw pops if opens mouth too wide  . Ovarian cyst   . PCOS (polycystic ovarian syndrome)   . Pilonidal cyst 02/2013  . Preterm labor 2016   PPROM @ 18 wks,  delivery of IUFD @ 21 wks  . Seasonal allergies   . SVD (spontaneous vaginal delivery)    x 2 - both fetal demises 2nd trim.  . Vaginal Pap smear, abnormal     Past Surgical History:  Procedure Laterality Date  . CERVICAL CERCLAGE N/A 01/21/2018   Procedure: CERCLAGE CERVICAL;  Surgeon: Lesly Dukes, MD;  Location: [facility]TH[facility] SU[facility];  Service: Gynecology;  Laterality: N/A;  . CERVICAL CERCLAGE  07/09/2018   Procedure: CERCLAGE CERVICAL removal;  Surgeon: Tereso Newcomer, MD;  Location: [facility];  Service: Obstetrics;;  after c-section cerclage removed   . CESAREAN SECTION N/A 07/09/2018   Procedure: CESAREAN SECTION;  Surgeon: Tereso Newcomer, MD;  Location: [facility];  Service: Obstetrics;  Laterality: N/A;  . CHOLECYSTECTOMY N/A 07/21/2016   Procedure: LAPAROSCOPIC CHOLECYSTECTOMY;  Surgeon: Abigail Miyamoto, MD;  Location: [facility] OR;  Service: General;  Laterality: N/A;  . PILONIDAL CYST EXCISION N/A 03/12/2013   Procedure: CYST EXCISION PILONIDAL EXTENSIVE;  Surgeon: Shelly Rubenstein, MD;  Location: [facility];  Service: General;  Laterality: N/A;  . WISDOM TOOTH EXTRACTION      Social History   Tobacco Use  . Smoking status: Current Some Day Smoker    Packs/day: 0.25    Years: 12.00    Pack years: 3.00    Types: Cigarettes    Last attempt to quit: 01/10/2018    Years since quitting: 2.1  . Smokeless tobacco: Never Used  Vaping Use  . Vaping Use: Never used  Substance Use Topics  . Alcohol use: No    Comment: socially but none with pregnancy  . Drug use: Not Currently    Types: Hydrocodone    Comment: prescribed this medication    Family History  Problem Relation Age of Onset  . Hypertension Mother   . Hyperlipidemia Mother   . Diabetes Mother   . Thyroid disease Mother   . Diabetes Father   . Hypertension Father   . Alcohol abuse Father   . Breast cancer Maternal Aunt   . Pancreatic cancer Paternal Grandfather      Allergies  Allergen Reactions  . Lactose Intolerance (Gi) Diarrhea    Medication list has been reviewed and updated.  No current outpatient medications on file prior to visit.   No current facility-administered medications on file prior to visit.    Review of Systems: As per HPI- otherwise negative.   Physical Examination: Vitals:   03/09/20 1301  BP: 116/78  Pulse: 99  Resp: 12  Temp: 98.2 F (36.8 C)  SpO2: 97%   Vitals:   03/09/20 1301  Weight: 285 lb (129.3 kg)  Height: 5\' 2"  (1.575 m)   Body mass index is 52.13  kg/m. Ideal Body Weight: Weight in (lb) to have BMI = 25: 136.4  GEN: no acute distress. Quite obese but otherwise looks well  HEENT: Atraumatic, Normocephalic.  Ears and Nose: No external deformity. CV: RRR, No M/G/R. No JVD. No thrill. No extra heart sounds. PULM: CTA B, no wheezes, crackles, rhonchi. No retractions. No resp. distress. No accessory muscle use. EXTR: No c/c/e PSYCH: Normally interactive. Conversant.  Normal foot exam except for evidence of tinea pedis Normal sensation and circulation    Assessment and Plan: Influenza vaccine administered - Plan: Flu Vaccine QUAD 36+ mos IM (Fluarix, Fluzone & Afluria Quad PF  Tobacco abuse - Plan: buPROPion (WELLBUTRIN SR) 150 MG 12 hr tablet  Eczema, unspecified type - Plan: Ambulatory referral to Dermatology  Tinea cruris - Plan: terbinafine (LAMISIL) 250 MG tablet  Snoring - Plan: Ambulatory referral to ENT  Following up today She would like to quit smoking- chantix not well tolerated, will have her try wellbutrin.  I hope this will help with her depression sx as well, but asked her to let me know if anxiety is getting worse  Referral to ENT to discuss her snoring- pt states she was told in the past that her nasal anatomy was causing her to snore Referral to dermatology to discuss eczema of her scalp Asked her to see m in 3-4 months. Let me know if not doing ok in the meantime  This  visit occurred during the SARS-CoV-2 public health emergency.  Safety protocols were in place, including screening questions prior to the visit, additional usage of staff PPE, and extensive cleaning of exam room while observing appropriate contact time as indicated for disinfecting solutions.    Signed Abbe Amsterdam, MD

## 2020-03-09 ENCOUNTER — Other Ambulatory Visit: Payer: Self-pay

## 2020-03-09 ENCOUNTER — Encounter: Payer: Self-pay | Admitting: Family Medicine

## 2020-03-09 ENCOUNTER — Ambulatory Visit (INDEPENDENT_AMBULATORY_CARE_PROVIDER_SITE_OTHER): Payer: Managed Care, Other (non HMO) | Admitting: Family Medicine

## 2020-03-09 VITALS — BP 116/78 | HR 99 | Temp 98.2°F | Resp 12 | Ht 62.0 in | Wt 285.0 lb

## 2020-03-09 DIAGNOSIS — Z23 Encounter for immunization: Secondary | ICD-10-CM | POA: Diagnosis not present

## 2020-03-09 DIAGNOSIS — L309 Dermatitis, unspecified: Secondary | ICD-10-CM

## 2020-03-09 DIAGNOSIS — R0683 Snoring: Secondary | ICD-10-CM | POA: Diagnosis not present

## 2020-03-09 DIAGNOSIS — Z720 Tobacco use: Secondary | ICD-10-CM | POA: Diagnosis not present

## 2020-03-09 DIAGNOSIS — B356 Tinea cruris: Secondary | ICD-10-CM

## 2020-03-09 MED ORDER — TERBINAFINE HCL 250 MG PO TABS: 250.0000 mg | ORAL_TABLET | Freq: Every day | ORAL | 0 refills | Status: DC

## 2020-03-09 MED ORDER — BUPROPION HCL ER (SR) 150 MG PO TB12: 150.0000 mg | ORAL_TABLET | Freq: Two times a day (BID) | ORAL | 3 refills | Status: DC

## 2020-03-09 NOTE — Patient Instructions (Addendum)
Get a uric acid and salicylic acid foot cream OTC/ [employer].  Will help with excess dead skin on feet  We will refer you to see ENT about your nose and also dermatology about your excema rx for terbinafine- take daily for about one month for foot fungal infection I also sent in wellbutrin for you to use for depression sx and smoking.  Take once a day for 3 days, then increase to twice a day Let me know if this is not helping or making you worse!   Please see me in 3-4 months to check on how you are doing

## 2020-03-12 ENCOUNTER — Encounter: Payer: Self-pay | Admitting: Family Medicine

## 2020-04-12 ENCOUNTER — Ambulatory Visit (INDEPENDENT_AMBULATORY_CARE_PROVIDER_SITE_OTHER): Payer: Managed Care, Other (non HMO) | Admitting: Otolaryngology

## 2020-04-17 ENCOUNTER — Emergency Department (HOSPITAL_BASED_OUTPATIENT_CLINIC_OR_DEPARTMENT_OTHER)
Admission: EM | Admit: 2020-04-17 | Discharge: 2020-04-17 | Disposition: A | Discharge status: Home / Self Care | Payer: Managed Care, Other (non HMO) | Attending: Emergency Medicine | Admitting: Emergency Medicine

## 2020-04-17 ENCOUNTER — Other Ambulatory Visit: Payer: Self-pay

## 2020-04-17 ENCOUNTER — Encounter (HOSPITAL_BASED_OUTPATIENT_CLINIC_OR_DEPARTMENT_OTHER): Payer: Self-pay | Admitting: *Deleted

## 2020-04-17 DIAGNOSIS — M79662 Pain in left lower leg: Secondary | ICD-10-CM | POA: Insufficient documentation

## 2020-04-17 DIAGNOSIS — E669 Obesity, unspecified: Secondary | ICD-10-CM | POA: Insufficient documentation

## 2020-04-17 DIAGNOSIS — E1169 Type 2 diabetes mellitus with other specified complication: Secondary | ICD-10-CM | POA: Insufficient documentation

## 2020-04-17 DIAGNOSIS — F17210 Nicotine dependence, cigarettes, uncomplicated: Secondary | ICD-10-CM | POA: Diagnosis not present

## 2020-04-17 DIAGNOSIS — Z79899 Other long term (current) drug therapy: Secondary | ICD-10-CM | POA: Insufficient documentation

## 2020-04-17 DIAGNOSIS — M79605 Pain in left leg: Secondary | ICD-10-CM

## 2020-04-17 NOTE — ED Triage Notes (Signed)
Pt reports pain to anterior left lower leg x 3-4 days. Denies injury. States it is tender to touch

## 2020-04-17 NOTE — Discharge Instructions (Signed)
Recommend returning to this facility tomorrow morning after 8:30 AM for lower extremity ultrasound to rule out DVT.  Return to the emergency department sooner for chest pain, shortness of breath, significantly worsening pain, spreading pain, or any other major concerns.

## 2020-04-17 NOTE — ED Provider Notes (Signed)
[facility] EMERGENCY DEPARTMENT Provider Note   CSN: [id number] Arrival date & time: 04/17/20  2204     History Chief Complaint  Patient presents with  . Leg Pain    Traci Peterson is a 34 y.o. female.  HPI      Traci Peterson is a 34 y.o. female, with a history of anxiety, DM type II, presenting to the ED with 3 to 4 days of left lower leg pain. Patient states she typically experiences this pain when walking.  It is described as a deep aching, radiates up and down the shin, improves with rest. Patient states she is quite concerned about a possible DVT.  She denies any history of DVT/PE, recent surgery, recent immobilization, recent trauma.  Fever/chills, numbness, weakness, swelling, shortness of breath, chest pain, or any other complaints.  Past Medical History:  Diagnosis Date  . Anxiety    no current med.  . Cervix prolapsed into vagina   . Chronic headaches    otc med prn  . Complication of anesthesia    narrow airway and had difficulty breathing after cholecystectomy  . Constipation   . Depression    no meds currently  . Diabetes mellitus without complication (HCC)    type 2  . Gall stones    Resolved with surgery  . GERD (gastroesophageal reflux disease)   . Gestational diabetes   . HPV in female   . Jaw snapping    states jaw pops if opens mouth too wide  . Ovarian cyst   . PCOS (polycystic ovarian syndrome)   . Pilonidal cyst 02/2013  . Preterm labor 2016   PPROM @ 18 wks, delivery of IUFD @ 21 wks  . Seasonal allergies   . SVD (spontaneous vaginal delivery)    x 2 - both fetal demises 2nd trim.  . Vaginal Pap smear, abnormal     Patient Active Problem List   Diagnosis Date Noted  . Prediabetes 08/11/2019  . Breast pain, right 07/23/2018  . Insulin controlled gestational diabetes mellitus (GDM) during pregnancy 05/08/2018  . Heartburn in pregnancy 02/13/2018  . Trichomonal vaginitis during pregnancy 01/20/2018  . Supervision  of high risk pregnancy, antepartum 01/01/2018  . Pre-existing type 2 diabetes mellitus during pregnancy, antepartum 12/16/2017  . Vitamin D deficiency 12/10/2017  . MDD (major depressive disorder), recurrent episode, severe (HCC) 05/29/2017  . Incompetent cervix 09/12/2016  . Gall stones 07/19/2016  . Family history of breast cancer in female 06/06/2016  . Nexplanon insertion 08/08/2015  . Obesity in pregnancy 10/25/2014  . Eczema 10/25/2014  . 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 Chronic headaches -  no meds 10/25/2014  . Anxiety and depression 11/30/2013  . PCOS (polycystic ovarian syndrome) 04/20/2013    Past Surgical History:  Procedure Laterality Date  . CERVICAL CERCLAGE N/A 01/21/2018   Procedure: CERCLAGE CERVICAL;  Surgeon: Lesly Dukes, MD;  Location: [facility] B[facility]THING SUIT[facility];  Service: Gynecology;  Laterality: N/A;  . CERVICAL CERCLAGE  07/09/2018   Procedure: CERCLAGE CERVICAL removal;  Surgeon: Tereso Newcomer, MD;  Location: [facility];  Service: Obstetrics;;  after c-section cerclage removed   . CESAREAN SECTION N/A 07/09/2018   Procedure: CESAREAN SECTION;  Surgeon: Tereso Newcomer, MD;  Location: [facility];  Service: Obstetrics;  Laterality: N/A;  . CHOLECYSTECTOMY N/A 07/21/2016   Procedure: LAPAROSCOPIC CHOLECYSTECTOMY;  Surgeon: Abigail Miyamoto, MD;  Location: [facility]  OR;  Service: General;  Laterality: N/A;  . PILONIDAL CYST EXCISION N/A 03/12/2013   Procedure: CYST EXCISION PILONIDAL EXTENSIVE;  Surgeon: Shelly Rubenstein, MD;  Location: [facility];  Service: General;  Laterality: N/A;  . WISDOM TOOTH EXTRACTION       OB History    Gravida  3   Para  2   Term  0   Preterm  2   AB  0   Living  0     SAB  0   TAB  0   Ectopic  0   Multiple  1   Live Births  2           Family History  Problem Relation Age of Onset  .  Hypertension Mother   . Hyperlipidemia Mother   . Diabetes Mother   . Thyroid disease Mother   . Diabetes Father   . Hypertension Father   . Alcohol abuse Father   . Breast cancer Maternal Aunt   . Pancreatic cancer Paternal Grandfather     Social History   Tobacco Use  . Smoking status: Current Some Day Smoker    Packs/day: 0.25    Years: 12.00    Pack years: 3.00    Types: Cigarettes    Last attempt to quit: 01/10/2018    Years since quitting: 2.2  . Smokeless tobacco: Never Used  Vaping Use  . Vaping Use: Never used  Substance Use Topics  . Alcohol use: No    Comment: socially but none with pregnancy  . Drug use: Not Currently    Types: Hydrocodone    Comment: prescribed this medication    Home Medications Prior to Admission medications   Medication Sig Start Date End Date Taking? Authorizing Provider  buPROPion (WELLBUTRIN SR) 150 MG 12 hr tablet Take 1 tablet (150 mg total) by mouth 2 (two) times daily. 03/09/20  Yes Copland, Gwenlyn Found, MD  terbinafine (LAMISIL) 250 MG tablet Take 1 tablet (250 mg total) by mouth daily. 03/09/20  Yes Copland, Gwenlyn Found, MD    Allergies    Lactose intolerance (gi)  Review of Systems   Review of Systems  Constitutional: Negative for chills and fever.  Respiratory: Negative for shortness of breath.   Cardiovascular: Negative for chest pain.  Gastrointestinal: Negative for abdominal pain.  Musculoskeletal: Negative for back pain.  Neurological: Negative for weakness and numbness.    Physical Exam Updated Vital Signs BP (!) 116/92 (BP Location: Left Arm)   Pulse 94   Temp 98.2 F (36.8 C) (Oral)   Resp 18   Ht 5\' 2"  (1.575 m)   Wt 128.8 kg   SpO2 97%   Breastfeeding No   BMI 51.94 kg/m   Physical Exam Vitals and nursing note reviewed.  Constitutional:      General: She is not in acute distress.    Appearance: She is well-developed. She is not diaphoretic.  HENT:     Head: Normocephalic and atraumatic.  Eyes:      Conjunctiva/sclera: Conjunctivae normal.  Cardiovascular:     Rate and Rhythm: Normal rate and regular rhythm.     Pulses:          Dorsalis pedis pulses are 2+ on the right side and 2+ on the left side.       Posterior tibial pulses are 2+ on the right side and 2+ on the left side.  Pulmonary:     Effort: Pulmonary effort is normal.  Comments: No increased work of breathing.  Speaks in full sentences without difficulty. Musculoskeletal:     Cervical back: Neck supple.     Right lower leg: No edema.     Left lower leg: No edema.     Comments: Tenderness to the left anterior lower leg over the tibia without noted swelling, color abnormality, increased warmth, deformity. The only time I elicited pain to the calf was with very deep palpation.  Nonpainful range of motion in the left knee and ankle.  Skin:    General: Skin is warm and dry.     Coloration: Skin is not pale.  Neurological:     Mental Status: She is alert.     Comments: Sensation grossly intact to light touch in the lower extremities bilaterally.  Strength 5/5 in the bilateral lower extremities. No noted gait deficit. Coordination intact.  Psychiatric:        Behavior: Behavior normal.     ED Results / Procedures / Treatments   Labs (all labs ordered are listed, but only abnormal results are displayed) Labs Reviewed - No data to display  EKG None  Radiology No results found.  Procedures Procedures (including critical care time)  Medications Ordered in ED Medications - No data to display  ED Course  I have reviewed the triage vital signs and the nursing notes.  Pertinent labs & imaging results that were available during my care of the patient were reviewed by me and considered in my medical decision making (see chart for details).    MDM Rules/Calculators/A&P                          Patient presents with left lower leg pain for the last 3 to 4 days. Patient is nontoxic appearing, afebrile, not  tachycardic, not tachypneic, not hypotensive, excellent SPO2 on room air, and is in no apparent distress.   We do not have vascular ultrasound to rule out DVT at this facility at this time, however, an order was placed for the patient to return in the morning to obtain this study. These instructions were communicated with the patient and written on her discharge information. If this study is negative, patient may follow-up with her PCP on this matter.   The patient was given instructions for home care as well as return precautions. Patient voices understanding of these instructions, accepts the plan, and is comfortable with discharge.   Final Clinical Impression(s) / ED Diagnoses Final diagnoses:  Left leg pain    Rx / DC Orders ED Discharge Orders         Ordered    VAS [place] LOWER EXTREMITY VENOUS (DVT)        04/17/20 2329           Anselm Pancoast, PA-C 04/18/20 0022    Charlynne Pander, MD 04/20/20 [id number]

## 2020-04-18 ENCOUNTER — Encounter: Payer: Self-pay | Admitting: Family Medicine

## 2020-04-19 ENCOUNTER — Encounter (INDEPENDENT_AMBULATORY_CARE_PROVIDER_SITE_OTHER): Payer: Self-pay | Admitting: Otolaryngology

## 2020-04-19 ENCOUNTER — Ambulatory Visit (INDEPENDENT_AMBULATORY_CARE_PROVIDER_SITE_OTHER): Payer: Managed Care, Other (non HMO) | Admitting: Otolaryngology

## 2020-04-19 ENCOUNTER — Other Ambulatory Visit: Payer: Self-pay

## 2020-04-19 VITALS — Temp 97.9°F

## 2020-04-19 DIAGNOSIS — G4733 Obstructive sleep apnea (adult) (pediatric): Secondary | ICD-10-CM | POA: Diagnosis not present

## 2020-04-19 DIAGNOSIS — R0683 Snoring: Secondary | ICD-10-CM | POA: Diagnosis not present

## 2020-04-19 MED ORDER — TRIAMCINOLONE ACETONIDE 55 MCG/ACT NA AERO: 2.0000 | INHALATION_SPRAY | Freq: Every day | NASAL | 12 refills | Status: AC

## 2020-04-19 NOTE — Progress Notes (Signed)
HPI: Traci Peterson is a 34 y.o. female who presents is referred by her PCP Dr. Patsy Lager for evaluation of snoring and obstructive sleep apnea.  Patient snores on a regular basis and does not feels like she gets good sleep at night.  She has gained roughly 50 pounds over the past 5 years.  She complains of nasal congestion which is worse on the right side.  She does have history of allergies for which she takes Zyrtec. She never feels well rested..  Past Medical History:  Diagnosis Date  . Anxiety    no current med.  . Cervix prolapsed into vagina   . Chronic headaches    otc med prn  . Complication of anesthesia    narrow airway and had difficulty breathing after cholecystectomy  . Constipation   . Depression    no meds currently  . Diabetes mellitus without complication (HCC)    type 2  . Gall stones    Resolved with surgery  . GERD (gastroesophageal reflux disease)   . Gestational diabetes   . HPV in female   . Jaw snapping    states jaw pops if opens mouth too wide  . Ovarian cyst   . PCOS (polycystic ovarian syndrome)   . Pilonidal cyst 02/2013  . Preterm labor 2016   PPROM @ 18 wks, delivery of IUFD @ 21 wks  . Seasonal allergies   . SVD (spontaneous vaginal delivery)    x 2 - both fetal demises 2nd trim.  . Vaginal Pap smear, abnormal    Past Surgical History:  Procedure Laterality Date  . CERVICAL CERCLAGE N/A 01/21/2018   Procedure: CERCLAGE CERVICAL;  Surgeon: Lesly Dukes, MD;  Location: [facility] BIRTHING SUIT[facility];  Service: Gynecology;  Laterality: N/A;  . CERVICAL CERCLAGE  07/09/2018   Procedure: CERCLAGE CERVICAL removal;  Surgeon: Tereso Newcomer, MD;  Location: [facility]TH[facility] SU[facility];  Service: Obstetrics;;  after c-section cerclage removed   . CESAREAN SECTION N/A 07/09/2018   Procedure: CESAREAN SECTION;  Surgeon: Tereso Newcomer, MD;  Location: [facility]TH[facility];  Service: Obstetrics;  Laterality: N/A;  . CHOLECYSTECTOMY N/A 07/21/2016   Procedure:  LAPAROSCOPIC CHOLECYSTECTOMY;  Surgeon: Abigail Miyamoto, MD;  Location: [facility] OR;  Service: General;  Laterality: N/A;  . PILONIDAL CYST EXCISION N/A 03/12/2013   Procedure: CYST EXCISION PILONIDAL EXTENSIVE;  Surgeon: Shelly Rubenstein, MD;  Location:  SURGERY CENTER;  Service: General;  Laterality: N/A;  . WISDOM TOOTH EXTRACTION     Social History   Socioeconomic History  . Marital status: Married    Spouse name: Dorinda Hill  . Number of children: Not on file  . Years of education: Not on file  . Highest education level: Not on file  Occupational History  . Occupation: CMA    Employer: OTHER    Comment: CNA  Tobacco Use  . Smoking status: Current Some Day Smoker    Packs/day: 0.25    Years: 13.00    Pack years: 3.25    Types: Cigarettes    Start date: 2008  . Smokeless tobacco: Never Used  Vaping Use  . Vaping Use: Never used  Substance and Sexual Activity  . Alcohol use: No    Comment: socially but none with pregnancy  . Drug use: Not Currently    Types: Hydrocodone    Comment: prescribed this medication  . Sexual activity: Not Currently    Birth control/protection: Implant    Comment: approx [redacted] wks gestation  Other Topics  Concern  . Not on file  Social History Narrative   ** Merged History Encounter **       Social Determinants of Health   Financial Resource Strain:   . Difficulty of Paying Living Expenses: Not on file  Food Insecurity:   . Worried About Programme researcher, broadcasting/film/video in the Last Year: Not on file  . Ran Out of Food in the Last Year: Not on file  Transportation Needs:   . Lack of Transportation (Medical): Not on file  . Lack of Transportation (Non-Medical): Not on file  Physical Activity:   . Days of Exercise per Week: Not on file  . Minutes of Exercise per Session: Not on file  Stress:   . Feeling of Stress : Not on file  Social Connections:   . Frequency of Communication with Friends and Family: Not on file  . Frequency of Social Gatherings  with Friends and Family: Not on file  . Attends Religious Services: Not on file  . Active Member of Clubs or Organizations: Not on file  . Attends Banker Meetings: Not on file  . Marital Status: Not on file   Family History  Problem Relation Age of Onset  . Hypertension Mother   . Hyperlipidemia Mother   . Diabetes Mother   . Thyroid disease Mother   . Diabetes Father   . Hypertension Father   . Alcohol abuse Father   . Breast cancer Maternal Aunt   . Pancreatic cancer Paternal Grandfather    Allergies  Allergen Reactions  . Lactose Intolerance (Gi) Diarrhea   Prior to Admission medications   Medication Sig Start Date End Date Taking? Authorizing Provider  buPROPion (WELLBUTRIN SR) 150 MG 12 hr tablet Take 1 tablet (150 mg total) by mouth 2 (two) times daily. 03/09/20   Copland, Gwenlyn Found, MD  terbinafine (LAMISIL) 250 MG tablet Take 1 tablet (250 mg total) by mouth daily. 03/09/20   Copland, Gwenlyn Found, MD  triamcinolone (NASACORT) 55 MCG/ACT AERO nasal inhaler Place 2 sprays into the nose daily. 04/19/20   Drema Halon, MD     Positive ROS: Otherwise negative  All other systems have been reviewed and were otherwise negative with the exception of those mentioned in the HPI and as above.  Physical Exam: Constitutional: Alert, well-appearing, no acute distress Ears: External ears without lesions or tenderness. Ear canals are clear bilaterally with intact, clear TMs.  Nasal: External nose without lesions. Septum relatively midline.  Moderate rhinitis with more swelling on the right side.  After decongesting the nose the nasal passages otherwise clear with no polyps or obstructing lesions..  Oral: Lips and gums without lesions. Tongue and palate mucosa without lesions. Posterior oropharynx clear.  Indirect laryngoscopy revealed a clear base of tongue vallecula epiglottis and vocal cords. Neck: No palpable adenopathy or masses Respiratory: Breathing  comfortably  Skin: No facial/neck lesions or rash noted.  Procedures  Assessment: Chronic snoring with probable obstructive sleep apnea. Chronic rhinitis  Plan: Recommended regular use of Nasacort 2 sprays each nostril at night as this will help some of the nasal congestion. We will plan on scheduling her for a sleep test and pending results of the sleep test probable use of CPAP. She is working on losing weight.   Narda Bonds, MD   CC:

## 2020-04-21 ENCOUNTER — Other Ambulatory Visit: Payer: Self-pay | Admitting: Family Medicine

## 2020-04-21 DIAGNOSIS — B356 Tinea cruris: Secondary | ICD-10-CM

## 2020-04-21 NOTE — Patient Instructions (Addendum)
It was good to see you again today - I cannot feel your nexplanon today.  I would suggest seeing GYN about this and see if they can locate for removal.  Once this is out I am glad to manage your contraception for you!   We tried to incise and drain the area over your tailbone today- unfortunately no pus drained,  We will start you on antibiotic today  Keep the area clean and dry- you can wash with soap and water and pat dry, keep covered with a bandage (or use a pad in your underwear to protect from bleeding).  If any significant bleeding or sign of infection please alert me. We will use augmentin for 7- 10 days (antibiotic)  Let me know if any concerns or if this is not getting better as expected

## 2020-04-21 NOTE — Progress Notes (Signed)
[facility] [address], [address] [place], [place] [address] [phone]  Date:  04/25/2020   Name:  Traci Peterson   DOB:  07/03/1985   MRN:  [id number]  PCP:  Pearline Cables, MD    Chief Complaint: Contraception (switching contraception)   History of Present Illness:  Traci Peterson is a 34 y.o. very pleasant female patient who presents with the following:  Pt here today to discuss birth control History of DM2, depression and obesity Last seen by myself in October of this year She lost a child in 07-26-2018- her son died 3 days after birth   At our last visit we started wellbutrin for depression and smoking cessation  Eye exam Urine micro  She plans to have her nexplanon removed- this was placed 07/2018 after the birth of her deceased son Forde Radon Unfortunately, Nexplanon seems to be causing persistent vaginal bleeding.  She plans to have this removed by her GYN I advised her that I am glad to take over contraception after her Nexplanon is removed  Today, she also notes concern of possible recurrent pilonidal cyst.  She reports having a large cyst "over my coccyx" treated in the operating room several years ago.  She has noted some recurrent tenderness at this area and feels like the cyst is coming back  She otherwise feels well does not have any fever  Lab Results  Component Value Date   HGBA1C 6.6 12/24/2019     Patient Active Problem List   Diagnosis Date Noted  . Prediabetes 08/11/2019  . Breast pain, right 07/23/2018  . Insulin controlled gestational diabetes mellitus (GDM) during pregnancy 05/08/2018  . Heartburn in pregnancy 02/13/2018  . Trichomonal vaginitis during pregnancy 01/20/2018  . Supervision of high risk pregnancy, antepartum 01/01/2018  . Pre-existing type 2 diabetes mellitus during pregnancy, antepartum 12/16/2017  . Vitamin D deficiency 12/10/2017  . MDD (major depressive disorder),  recurrent episode, severe (HCC) 05/29/2017  . Incompetent cervix 09/12/2016  . Gall stones 07/19/2016  . Family history of breast cancer in female 06/06/2016  . Nexplanon insertion 08/08/2015  . Obesity in pregnancy 10/25/2014  . Eczema 10/25/2014  . 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 Chronic headaches -  no meds 10/25/2014  . Anxiety and depression 11/30/2013  . PCOS (polycystic ovarian syndrome) 04/20/2013    Past Medical History:  Diagnosis Date  . Anxiety    no current med.  . Cervix prolapsed into vagina   . Chronic headaches    otc med prn  . Complication of anesthesia    narrow airway and had difficulty breathing after cholecystectomy  . Constipation   . Depression    no meds currently  . Diabetes mellitus without complication (HCC)    type 2  . Gall stones    Resolved with surgery  . GERD (gastroesophageal reflux disease)   . Gestational diabetes   . HPV in female   . Jaw snapping    states jaw pops if opens mouth too wide  . Ovarian cyst   . PCOS (polycystic ovarian syndrome)   . Pilonidal cyst 02/2013  . Preterm labor 2016   PPROM @ 18 wks, delivery of IUFD @ 21 wks  . Seasonal allergies   . SVD (spontaneous vaginal delivery)    x 2 - both fetal demises 2nd trim.  . Vaginal Pap  smear, abnormal     Past Surgical History:  Procedure Laterality Date  . CERVICAL CERCLAGE N/A 01/21/2018   Procedure: CERCLAGE CERVICAL;  Surgeon: Lesly Dukes, MD;  Location: [facility] BIRTHING SUITES;  Service: Gynecology;  Laterality: N/A;  . CERVICAL CERCLAGE  07/09/2018   Procedure: CERCLAGE CERVICAL removal;  Surgeon: Tereso Newcomer, MD;  Location: [facility];  Service: Obstetrics;;  after c-section cerclage removed   . CESAREAN SECTION N/A 07/09/2018   Procedure: CESAREAN SECTION;  Surgeon: Tereso Newcomer, MD;  Location: [facility];  Service: Obstetrics;  Laterality: N/A;  .  CHOLECYSTECTOMY N/A 07/21/2016   Procedure: LAPAROSCOPIC CHOLECYSTECTOMY;  Surgeon: Abigail Miyamoto, MD;  Location: [facility] OR;  Service: General;  Laterality: N/A;  . PILONIDAL CYST EXCISION N/A 03/12/2013   Procedure: CYST EXCISION PILONIDAL EXTENSIVE;  Surgeon: Shelly Rubenstein, MD;  Location: [facility];  Service: General;  Laterality: N/A;  . WISDOM TOOTH EXTRACTION      Social History   Tobacco Use  . Smoking status: Current Some Day Smoker    Packs/day: 0.25    Years: 13.00    Pack years: 3.25    Types: Cigarettes    Start date: 2008  . Smokeless tobacco: Never Used  Vaping Use  . Vaping Use: Never used  Substance Use Topics  . Alcohol use: No    Comment: socially but none with pregnancy  . Drug use: Not Currently    Types: Hydrocodone    Comment: prescribed this medication    Family History  Problem Relation Age of Onset  . Hypertension Mother   . Hyperlipidemia Mother   . Diabetes Mother   . Thyroid disease Mother   . Diabetes Father   . Hypertension Father   . Alcohol abuse Father   . Breast cancer Maternal Aunt   . Pancreatic cancer Paternal Grandfather     Allergies  Allergen Reactions  . Lactose Intolerance (Gi) Diarrhea    Medication list has been reviewed and updated.  Current Outpatient Medications on File Prior to Visit  Medication Sig Dispense Refill  . buPROPion (WELLBUTRIN SR) 150 MG 12 hr tablet Take 1 tablet (150 mg total) by mouth 2 (two) times daily. 60 tablet 3  . etonogestrel (NEXPLANON) 68 MG IMPL implant 1 each by Subdermal route once.    . terbinafine (LAMISIL) 250 MG tablet Take 1 tablet (250 mg total) by mouth daily. 30 tablet 0  . triamcinolone (NASACORT) 55 MCG/ACT AERO nasal inhaler Place 2 sprays into the nose daily. 1 each 12   No current facility-administered medications on file prior to visit.    Review of Systems:  As per HPI- otherwise negative.   Physical Examination: Vitals:   04/25/20 1122  BP: (!)  128/92  Pulse: 98  Resp: 18  SpO2: 100%   Vitals:   04/25/20 1122  Weight: 288 lb (130.6 kg)  Height: 5\' 2"  (1.575 m)   Body mass index is 52.68 kg/m. Ideal Body Weight: Weight in (lb) to have BMI = 25: 136.4  GEN: no acute distress.  Obese, looks well HEENT: Atraumatic, Normocephalic.  Ears and Nose: No external deformity. CV: RRR, No M/G/R. No JVD. No thrill. No extra heart sounds. PULM: CTA B, no wheezes, crackles, rhonchi. No retractions. No resp. distress. No accessory muscle use. EXTR: No c/c/e PSYCH: Normally interactive. Conversant.  I am not able to palpate her Nexplanon rod in the left upper arm Patient has a large scar over the  superior gluteal cleft from previous operative I&D.  There is some tenderness and possible fluctuance on the left side of the gluteal cleft.  No pointing or heat. Discussed risks, benefits, alternatives.  Patient would like to proceed with I&D at this time  Prepped area with Betadine.  Injected approximately 4 cc of 2% lidocaine for anesthesia.  Made a 1 cm incision over fluctuant area with blade, gently probed wound with forceps but no pus was evident.  Applied dressing Patient tolerated well with no immediate complications EBL less than 5 mL   Assessment and Plan: Cellulitis and abscess of buttock - Plan: amoxicillin-clavulanate (AUGMENTIN) 500-125 MG tablet  Encounter for general counseling and advice on contraceptive management   Patient today for a couple of concerns.  She currently has Nexplanon, she does not like the persistent bleeding she has with this method.  I advised her that because I cannot feel the Nexplanon I did not feel comfortable attempting to remove it.  She plans to see Dr. Cherly Hensen with GYN about this issue, asked her to let me know if I can do anything else to assist.  Once it is removed I am certainly glad to manage contraceptive pills or other method of her choice  Possible recurrent abscess/pilonidal cyst.  At this  time I am not able to locate any pus.  Advised patient that pus may get drained, but I did not find it today.  We will start her on Augmentin twice a day.  Advised heat to try and raise any pus from abscess.  Discussed wound care in detail.  She will let me know if any concerns or if not getting better  This visit occurred during the SARS-CoV-2 public health emergency.  Safety protocols were in place, including screening questions prior to the visit, additional usage of staff PPE, and extensive cleaning of exam room while observing appropriate contact time as indicated for disinfecting solutions.    Signed Abbe Amsterdam, MD

## 2020-04-25 ENCOUNTER — Ambulatory Visit (INDEPENDENT_AMBULATORY_CARE_PROVIDER_SITE_OTHER): Payer: Managed Care, Other (non HMO) | Admitting: Family Medicine

## 2020-04-25 ENCOUNTER — Other Ambulatory Visit: Payer: Self-pay

## 2020-04-25 ENCOUNTER — Encounter: Payer: Self-pay | Admitting: Family Medicine

## 2020-04-25 VITALS — BP 128/92 | HR 98 | Resp 18 | Ht 62.0 in | Wt 288.0 lb

## 2020-04-25 DIAGNOSIS — Z3009 Encounter for other general counseling and advice on contraception: Secondary | ICD-10-CM | POA: Diagnosis not present

## 2020-04-25 DIAGNOSIS — L03317 Cellulitis of buttock: Secondary | ICD-10-CM | POA: Diagnosis not present

## 2020-04-25 DIAGNOSIS — L0231 Cutaneous abscess of buttock: Secondary | ICD-10-CM | POA: Diagnosis not present

## 2020-04-25 MED ORDER — AMOXICILLIN-POT CLAVULANATE 500-125 MG PO TABS: 1.0000 | ORAL_TABLET | Freq: Two times a day (BID) | ORAL | 0 refills | Status: DC

## 2020-05-11 ENCOUNTER — Other Ambulatory Visit: Payer: Self-pay

## 2020-05-11 ENCOUNTER — Encounter: Payer: Self-pay | Admitting: Obstetrics and Gynecology

## 2020-05-11 ENCOUNTER — Ambulatory Visit (INDEPENDENT_AMBULATORY_CARE_PROVIDER_SITE_OTHER): Payer: Managed Care, Other (non HMO) | Admitting: Obstetrics and Gynecology

## 2020-05-11 VITALS — Ht 62.0 in | Wt 282.0 lb

## 2020-05-11 DIAGNOSIS — Z3046 Encounter for surveillance of implantable subdermal contraceptive: Secondary | ICD-10-CM | POA: Diagnosis not present

## 2020-05-11 DIAGNOSIS — Z7689 Persons encountering health services in other specified circumstances: Secondary | ICD-10-CM

## 2020-05-11 LAB — GLUCOSE, POCT (MANUAL RESULT ENTRY): POC Glucose: 138 mg/dl — AB (ref 70–99)

## 2020-05-11 MED ORDER — NORGESTIMATE-ETH ESTRADIOL 0.25-35 MG-MCG PO TABS: 1.0000 | ORAL_TABLET | Freq: Every day | ORAL | 11 refills | Status: DC

## 2020-05-11 NOTE — Progress Notes (Signed)
     GYNECOLOGY OFFICE PROCEDURE NOTE  Traci Peterson is a 34 y.o. G3P0300 here for Nexplanon removal; poor OB history including 3 second trimester losses and fetal death. Nexplanon is causing abnormal bleeding. Her and partner are starting to discuss pregnancy. Reviewed other options for management of bleeding; patient declines. Would like nexplanon out.    Nexplanon Removal Patient identified, informed consent performed, consent signed.   Appropriate time out taken. Nexplanon site identified.  Area prepped in usual sterile fashon. One ml of 1% lidocaine was used to anesthetize the area at the distal end of the implant. A small stab incision was made right beside the implant on the distal portion.  The Nexplanon rod was grasped using hemostats and removed without difficulty.  There was minimal blood loss. There were no complications.  3 ml of 1% lidocaine was injected around the incision for post-procedure analgesia.  Steri-strips were applied over the small incision.  A pressure bandage was applied to reduce any bruising.  The patient tolerated the procedure well and was given post procedure instructions.  Patient is planning to use pills for contraception/attempt conception.  RX: Sprintec   Traci Peterson, Traci Rutherford, NP Faculty [employer] for [employer], [facility]

## 2020-05-11 NOTE — Addendum Note (Signed)
Addended by: Kathie Dike on: 05/11/2020 11:01 AM   Modules accepted: Orders

## 2020-05-11 NOTE — Progress Notes (Signed)
Pt has been bleeding since 03/15/20 with Nexplanon. Wants Nexplanon removed and wants to start OCP

## 2020-05-30 ENCOUNTER — Encounter: Payer: Self-pay | Admitting: Family Medicine

## 2020-05-30 DIAGNOSIS — B356 Tinea cruris: Secondary | ICD-10-CM

## 2020-05-30 MED ORDER — TERBINAFINE HCL 250 MG PO TABS: 250.0000 mg | ORAL_TABLET | Freq: Every day | ORAL | 0 refills | Status: DC

## 2020-06-12 NOTE — Patient Instructions (Incomplete)
It was good to see you again today!   

## 2020-06-12 NOTE — Progress Notes (Deleted)
[facility] [address], [address] [place], [place] [address] [phone]  Date:  06/20/2020   Name:  Traci Peterson   DOB:  1985-09-26   MRN:  [id number]  PCP:  Pearline Cables, MD    Chief Complaint: No chief complaint on file.   History of Present Illness:  Traci Peterson is a 35 y.o. very pleasant female patient who presents with the following:  Here today for a follow-up visit- DM2, depression and obesity Last seen by myself November with a possible pilonidal cyst  She had a nexplanon removed 12/15 DM is diet controlled currently  She lost a child in Jul 13, 2018- her son died 3 days after birth   Eye exam Urine micro  Lab Results  Component Value Date   HGBA1C 6.6 12/24/2019   covid booster not due yet  Flu vaccine   Patient Active Problem List   Diagnosis Date Noted  . Prediabetes 08/11/2019  . Breast pain, right 07/23/2018  . Insulin controlled gestational diabetes mellitus (GDM) during pregnancy 05/08/2018  . Heartburn in pregnancy 02/13/2018  . Trichomonal vaginitis during pregnancy 01/20/2018  . Supervision of high risk pregnancy, antepartum 01/01/2018  . Pre-existing type 2 diabetes mellitus during pregnancy, antepartum 12/16/2017  . Vitamin D deficiency 12/10/2017  . MDD (major depressive disorder), recurrent episode, severe (HCC) 05/29/2017  . Incompetent cervix 09/12/2016  . Gall stones 07/19/2016  . Family history of breast cancer in female 06/06/2016  . Nexplanon insertion 08/08/2015  . Obesity in pregnancy 10/25/2014  . Eczema 10/25/2014  . 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 Chronic headaches -  no meds 10/25/2014  . Anxiety and depression 11/30/2013  . PCOS (polycystic ovarian syndrome) 04/20/2013    Past Medical History:  Diagnosis Date  . Anxiety    no current med.  . Cervix prolapsed into vagina    . Chronic headaches    otc med prn  . Complication of anesthesia    narrow airway and had difficulty breathing after cholecystectomy  . Constipation   . Depression    no meds currently  . Diabetes mellitus without complication (HCC)    type 2  . Gall stones    Resolved with surgery  . GERD (gastroesophageal reflux disease)   . Gestational diabetes   . HPV in female   . Jaw snapping    states jaw pops if opens mouth too wide  . Ovarian cyst   . PCOS (polycystic ovarian syndrome)   . Pilonidal cyst 02/2013  . Preterm labor 2016   PPROM @ 18 wks, delivery of IUFD @ 21 wks  . Seasonal allergies   . SVD (spontaneous vaginal delivery)    x 2 - both fetal demises 2nd trim.  . Vaginal Pap smear, abnormal     Past Surgical History:  Procedure Laterality Date  . CERVICAL CERCLAGE N/A 01/21/2018   Procedure: CERCLAGE CERVICAL;  Surgeon: Lesly Dukes, MD;  Location: [facility] BIRTHING SUITES;  Service: Gynecology;  Laterality: N/A;  . CERVICAL CERCLAGE  07/09/2018   Procedure: CERCLAGE CERVICAL removal;  Surgeon: Tereso Newcomer, MD;  Location: [facility]TH[facility];  Service: Obstetrics;;  after c-section cerclage removed   . CESAREAN SECTION N/A 07/09/2018   Procedure: CESAREAN SECTION;  Surgeon: Tereso Newcomer, MD;  Location: [facility];  Service: Obstetrics;  Laterality: N/A;  . CHOLECYSTECTOMY  N/A 07/21/2016   Procedure: LAPAROSCOPIC CHOLECYSTECTOMY;  Surgeon: Abigail Miyamoto, MD;  Location: [facility] OR;  Service: General;  Laterality: N/A;  . PILONIDAL CYST EXCISION N/A 03/12/2013   Procedure: CYST EXCISION PILONIDAL EXTENSIVE;  Surgeon: Shelly Rubenstein, MD;  Location: [facility];  Service: General;  Laterality: N/A;  . WISDOM TOOTH EXTRACTION      Social History   Tobacco Use  . Smoking status: Current Some Day Smoker    Packs/day: 0.25    Years: 13.00    Pack years: 3.25    Types: Cigarettes    Start date: 2008  . Smokeless tobacco: Never Used   Vaping Use  . Vaping Use: Never used  Substance Use Topics  . Alcohol use: No    Comment: socially but none with pregnancy  . Drug use: Not Currently    Types: Hydrocodone    Comment: prescribed this medication    Family History  Problem Relation Age of Onset  . Hypertension Mother   . Hyperlipidemia Mother   . Diabetes Mother   . Thyroid disease Mother   . Diabetes Father   . Hypertension Father   . Alcohol abuse Father   . Breast cancer Maternal Aunt   . Pancreatic cancer Paternal Grandfather     Allergies  Allergen Reactions  . Lactose Intolerance (Gi) Diarrhea    Medication list has been reviewed and updated.  Current Outpatient Medications on File Prior to Visit  Medication Sig Dispense Refill  . amoxicillin-clavulanate (AUGMENTIN) 500-125 MG tablet Take 1 tablet (500 mg total) by mouth 2 (two) times daily. 20 tablet 0  . buPROPion (WELLBUTRIN SR) 150 MG 12 hr tablet Take 1 tablet (150 mg total) by mouth 2 (two) times daily. 60 tablet 3  . clindamycin (CLEOCIN T) 1 % external solution Apply topically 2 (two) times daily.    Marland Kitchen etonogestrel (NEXPLANON) 68 MG IMPL implant 1 each by Subdermal route once.    . fluocinonide (LIDEX) 0.05 % external solution Apply topically.    Marland Kitchen ketoconazole (NIZORAL) 2 % shampoo Apply topically 3 (three) times a week.    . norgestimate-ethinyl estradiol (ORTHO-CYCLEN) 0.25-35 MG-MCG tablet Take 1 tablet by mouth daily. 28 tablet 11  . terbinafine (LAMISIL) 250 MG tablet Take 1 tablet (250 mg total) by mouth daily. Take daily for 14 days 30 tablet 0  . triamcinolone (NASACORT) 55 MCG/ACT AERO nasal inhaler Place 2 sprays into the nose daily. 1 each 12   No current facility-administered medications on file prior to visit.    Review of Systems:  As per HPI- otherwise negative.   Physical Examination: There were no vitals filed for this visit. There were no vitals filed for this visit. There is no height or weight on file to  calculate BMI. Ideal Body Weight:    GEN: no acute distress. HEENT: Atraumatic, Normocephalic.  Ears and Nose: No external deformity. CV: RRR, No M/G/R. No JVD. No thrill. No extra heart sounds. PULM: CTA B, no wheezes, crackles, rhonchi. No retractions. No resp. distress. No accessory muscle use. ABD: S, NT, ND, +BS. No rebound. No HSM. EXTR: No c/c/e PSYCH: Normally interactive. Conversant.    Assessment and Plan: ***  This visit occurred during the SARS-CoV-2 public health emergency.  Safety protocols were in place, including screening questions prior to the visit, additional usage of staff PPE, and extensive cleaning of exam room while observing appropriate contact time as indicated for disinfecting solutions.    Signed Shanda Bumps Faven Watterson,  MD

## 2020-06-13 ENCOUNTER — Ambulatory Visit: Payer: Managed Care, Other (non HMO) | Admitting: Family Medicine

## 2020-06-17 ENCOUNTER — Other Ambulatory Visit: Payer: Self-pay

## 2020-06-19 ENCOUNTER — Encounter: Payer: Self-pay | Admitting: Family Medicine

## 2020-06-20 ENCOUNTER — Ambulatory Visit: Payer: Managed Care, Other (non HMO) | Admitting: Family Medicine

## 2020-06-20 DIAGNOSIS — E119 Type 2 diabetes mellitus without complications: Secondary | ICD-10-CM

## 2020-06-23 ENCOUNTER — Telehealth: Payer: BC Managed Care – PPO | Admitting: Nurse Practitioner

## 2020-06-23 DIAGNOSIS — J069 Acute upper respiratory infection, unspecified: Secondary | ICD-10-CM | POA: Diagnosis not present

## 2020-06-23 NOTE — Progress Notes (Signed)
Virtual Visit Progress Note  Traci Peterson,you are scheduled for a virtual visit with your provider today.    Just as we do with appointments in the office, we must obtain your consent to participate.  Your consent will be active for this visit and any virtual visit you may have with one of our providers in the next 365 days.    If you have a MyChart account, I can also send a copy of this consent to you electronically.  All virtual visits are billed to your insurance company just like a traditional visit in the office.  As this is a virtual visit, video technology does not allow for your provider to perform a traditional examination.  This may limit your provider's ability to fully assess your condition.  If your provider identifies any concerns that need to be evaluated in person or the need to arrange testing such as labs, EKG, etc, we will make arrangements to do so.    Although advances in technology are sophisticated, we cannot ensure that it will always work on either your end or our end.  If the connection with a video visit is poor, we may have to switch to a telephone visit.  With either a video or telephone visit, we are not always able to ensure that we have a secure connection.   I need to obtain your verbal consent now.   Are you willing to proceed with your visit today?   Traci Peterson has provided verbal consent on 06/24/2020 for a virtual visit (video or telephone).   Alinda Dooms, NP 06/23/2020  5:23 PM    I connected with Traci Peterson on 06/23/20 at  5:15 PM EST by video enabled telemedicine visit and verified that I am speaking with the correct person using two identifiers.   I discussed the limitations, risks, security and privacy concerns of performing an evaluation and management service by telemedicine and the availability of in-person appointments. I also discussed with the patient that there may be a patient responsible charge related to this service. The  patient expressed understanding and agreed to proceed.   Other persons participating in the visit and their role in the encounter: none  Patient's location: home Provider's location: home  Chief Complaint: URI    Patient Care Team: Copland, Gwenlyn Found, MD as PCP - General (Family Medicine)   Name of the patient: Traci Peterson  [id number]  1985-10-20   Date of visit: 06/23/20  Chief complaint/ Reason for visit- URI  History of Presenting Illness- Patient is 35 year old female who presents today for complaints of chills, congestion, and sore throat which started today. No fevers that she is aware of. No diarrhea, nausea, or vomiting. Says she is tired and 'feels bad'. No sick contacts. No cough. No runny nose. Vaccinated with moderna. Last vaccine in august or September.   Review of systems- Review of Systems  Constitutional: Positive for chills and malaise/fatigue. Negative for fever and weight loss.  HENT: Positive for congestion and sore throat. Negative for hearing loss, nosebleeds and tinnitus.   Eyes: Negative for blurred vision and double vision.  Respiratory: Negative for cough, hemoptysis, shortness of breath and wheezing.   Cardiovascular: Negative for chest pain, palpitations and leg swelling.  Gastrointestinal: Negative for abdominal pain, blood in stool, constipation, diarrhea, melena, nausea and vomiting.  Genitourinary: Negative for dysuria and urgency.  Musculoskeletal: Negative for back pain, falls, joint pain and myalgias.  Skin: Negative for itching and rash.  Neurological: Negative for dizziness, tingling, sensory change, loss of consciousness, weakness and headaches.  Endo/Heme/Allergies: Negative for environmental allergies. Does not bruise/bleed easily.  Psychiatric/Behavioral: Negative for depression. The patient is not nervous/anxious and does not have insomnia.      Allergies  Allergen Reactions  . Lactose Intolerance (Gi) Diarrhea    Past Medical  History:  Diagnosis Date  . Anxiety    no current med.  . Cervix prolapsed into vagina   . Chronic headaches    otc med prn  . Complication of anesthesia    narrow airway and had difficulty breathing after cholecystectomy  . Constipation   . Depression    no meds currently  . Diabetes mellitus without complication (HCC)    type 2  . Gall stones    Resolved with surgery  . GERD (gastroesophageal reflux disease)   . Gestational diabetes   . HPV in female   . Jaw snapping    states jaw pops if opens mouth too wide  . Ovarian cyst   . PCOS (polycystic ovarian syndrome)   . Pilonidal cyst 02/2013  . Preterm labor 2016   PPROM @ 18 wks, delivery of IUFD @ 21 wks  . Seasonal allergies   . SVD (spontaneous vaginal delivery)    x 2 - both fetal demises 2nd trim.  . Vaginal Pap smear, abnormal     Past Surgical History:  Procedure Laterality Date  . CERVICAL CERCLAGE N/A 01/21/2018   Procedure: CERCLAGE CERVICAL;  Surgeon: Lesly Dukes, MD;  Location: [facility];  Service: Gynecology;  Laterality: N/A;  . CERVICAL CERCLAGE  07/09/2018   Procedure: CERCLAGE CERVICAL removal;  Surgeon: Tereso Newcomer, MD;  Location: [facility];  Service: Obstetrics;;  after c-section cerclage removed   . CESAREAN SECTION N/A 07/09/2018   Procedure: CESAREAN SECTION;  Surgeon: Tereso Newcomer, MD;  Location: [facility];  Service: Obstetrics;  Laterality: N/A;  . CHOLECYSTECTOMY N/A 07/21/2016   Procedure: LAPAROSCOPIC CHOLECYSTECTOMY;  Surgeon: Abigail Miyamoto, MD;  Location: [facility] OR;  Service: General;  Laterality: N/A;  . PILONIDAL CYST EXCISION N/A 03/12/2013   Procedure: CYST EXCISION PILONIDAL EXTENSIVE;  Surgeon: Shelly Rubenstein, MD;  Location: [facility];  Service: General;  Laterality: N/A;  . WISDOM TOOTH EXTRACTION      Social History   Socioeconomic History  . Marital status: Married    Spouse name: Dorinda Hill  . Number of children: Not on  file  . Years of education: Not on file  . Highest education level: Not on file  Occupational History  . Occupation: CMA    Employer: OTHER    Comment: CNA  Tobacco Use  . Smoking status: Current Some Day Smoker    Packs/day: 0.25    Years: 13.00    Pack years: 3.25    Types: Cigarettes    Start date: 2008  . Smokeless tobacco: Never Used  Vaping Use  . Vaping Use: Never used  Substance and Sexual Activity  . Alcohol use: No    Comment: socially but none with pregnancy  . Drug use: Not Currently    Types: Hydrocodone    Comment: prescribed this medication  . Sexual activity: Not Currently    Birth control/protection: Implant    Comment: approx [redacted] wks gestation  Other Topics Concern  . Not on file  Social History Narrative   ** Merged History Encounter **       Social Determinants of Health  Financial Resource Strain: Not on file  Food Insecurity: Not on file  Transportation Needs: Not on file  Physical Activity: Not on file  Stress: Not on file  Social Connections: Not on file  Intimate Partner Violence: Not on file    Immunization History  Administered Date(s) Administered  . Influenza,inj,Quad PF,6+ Mos 03/02/2013, 08/08/2015, 05/30/2017, 03/03/2018, 03/09/2020  . Influenza-Unspecified 07/27/2014  . Moderna Sars-Covid-2 Vaccination 01/12/2020, 02/09/2020  . PPD Test 11/30/2013, 08/30/2014  . Pneumococcal Polysaccharide-23 05/25/2013  . Tdap 11/21/2002, 12/31/2011, 06/17/2018    Family History  Problem Relation Age of Onset  . Hypertension Mother   . Hyperlipidemia Mother   . Diabetes Mother   . Thyroid disease Mother   . Diabetes Father   . Hypertension Father   . Alcohol abuse Father   . Breast cancer Maternal Aunt   . Pancreatic cancer Paternal Grandfather      Current Outpatient Medications:  .  amoxicillin-clavulanate (AUGMENTIN) 500-125 MG tablet, Take 1 tablet (500 mg total) by mouth 2 (two) times daily., Disp: 20 tablet, Rfl: 0 .   buPROPion (WELLBUTRIN SR) 150 MG 12 hr tablet, Take 1 tablet (150 mg total) by mouth 2 (two) times daily., Disp: 60 tablet, Rfl: 3 .  clindamycin (CLEOCIN T) 1 % external solution, Apply topically 2 (two) times daily., Disp: , Rfl:  .  etonogestrel (NEXPLANON) 68 MG IMPL implant, 1 each by Subdermal route once., Disp: , Rfl:  .  fluocinonide (LIDEX) 0.05 % external solution, Apply topically., Disp: , Rfl:  .  ketoconazole (NIZORAL) 2 % shampoo, Apply topically 3 (three) times a week., Disp: , Rfl:  .  norgestimate-ethinyl estradiol (ORTHO-CYCLEN) 0.25-35 MG-MCG tablet, Take 1 tablet by mouth daily., Disp: 28 tablet, Rfl: 11 .  terbinafine (LAMISIL) 250 MG tablet, Take 1 tablet (250 mg total) by mouth daily. Take daily for 14 days, Disp: 30 tablet, Rfl: 0 .  triamcinolone (NASACORT) 55 MCG/ACT AERO nasal inhaler, Place 2 sprays into the nose daily., Disp: 1 each, Rfl: 12  Physical exam: Exam limited due to telemedicine Physical Exam Constitutional:      Comments: Fatigued appearing  Pulmonary:     Effort: No respiratory distress.     Breath sounds: No wheezing.  Neurological:     Mental Status: She is alert and oriented to person, place, and time.  Psychiatric:        Mood and Affect: Mood normal.        Behavior: Behavior normal.      Assessment and plan- Patient is a 35 y.o. female who presents today for concerns of URI. Question covid. Recommend testing and assisted patient with options for testing. Discussed PCR vs ANtigen testing. Recommend quarantine until results based on her symptoms. Work note provided. If positive we discussed symptoms that would warrant ER. She will return to clinic if positive for further guidance and discussion of symptom support.    Visit Diagnosis 1. Viral URI    Patient expressed understanding and was in agreement with this plan. She also understands that She can call clinic at any time with any questions, concerns, or complaints.   I discussed the  assessment and treatment plan with the patient. The patient was provided an opportunity to ask questions and all were answered. The patient agreed with the plan and demonstrated an understanding of the instructions.   The patient was advised to call back or seek an in-person evaluation if the symptoms worsen or if the condition fails to improve as  anticipated.   I provided 19 minutes of face-to-face video visit time during this encounter, and > 50% was spent counseling as documented under my assessment & plan.  Thank you for allowing me to participate in the care of this very pleasant patient.   Consuello Masse, DNP, AGNP-C [facility]

## 2020-06-24 ENCOUNTER — Other Ambulatory Visit: Payer: Managed Care, Other (non HMO)

## 2020-06-24 ENCOUNTER — Encounter: Payer: Self-pay | Admitting: Nurse Practitioner

## 2020-06-24 ENCOUNTER — Telehealth: Payer: BC Managed Care – PPO | Admitting: Nurse Practitioner

## 2020-06-24 DIAGNOSIS — R0989 Other specified symptoms and signs involving the circulatory and respiratory systems: Secondary | ICD-10-CM

## 2020-06-24 DIAGNOSIS — Z20822 Contact with and (suspected) exposure to covid-19: Secondary | ICD-10-CM

## 2020-06-24 MED ORDER — AZITHROMYCIN 250 MG PO TABS: ORAL_TABLET | ORAL | 0 refills | Status: DC

## 2020-06-24 NOTE — Progress Notes (Signed)
Virtual Visit via video visit    This visit type was conducted due to national recommendations for restrictions regarding the COVID-19 Pandemic (e.g. social distancing) in an effort to limit this patient's exposure and mitigate transmission in our community.  Due to her co-morbid illnesses, this patient is at least at moderate risk for complications without adequate follow up.  This format is felt to be most appropriate for this patient at this time.  All issues noted in this document were discussed and addressed.  A limited physical exam was performed with this format.    This visit type was conducted due to national recommendations for restrictions regarding the COVID-19 Pandemic (e.g. social distancing) in an effort to limit this patient's exposure and mitigate transmission in our community.  Patients identity confirmed using two different identifiers.  This format is felt to be most appropriate for this patient at this time.  All issues noted in this document were discussed and addressed.  No physical exam was performed (except for noted visual exam findings with Video Visits).     Ms. desi, fred are scheduled for a virtual visit with your provider today.    Just as we do with appointments in the office, we must obtain your consent to participate.  Your consent will be active for this visit and any virtual visit you may have with one of our providers in the next 365 days.    If you have a MyChart account, I can also send a copy of this consent to you electronically.  All virtual visits are billed to your insurance company just like a traditional visit in the office.  As this is a virtual visit, video technology does not allow for your provider to perform a traditional examination.  This may limit your provider's ability to fully assess your condition.  If your provider identifies any concerns that need to be evaluated in person or the need to arrange testing such as labs, EKG, etc, we will  make arrangements to do so.    Although advances in technology are sophisticated, we cannot ensure that it will always work on either your end or our end.  If the connection with a video visit is poor, we may have to switch to a telephone visit.  With either a video or telephone visit, we are not always able to ensure that we have a secure connection.   I need to obtain your verbal consent now.   Are you willing to proceed with your visit today?  YES    Charlesetta Ivory, NP 06/24/2020  11:19 AM   Date:  06/24/2020   ID:  Mavis Karnitz, DOB 08/09/1985, MRN [id number]  Patient Location:  In her car   Provider location:   Home Office  Chief Complaint:  Cough, congestion, No SOB, or chest pain, slight sore throat, HA, fatigue, some chills.   History of Present Illness:    Kandace Ririe is a 35 y.o. female who presents via video conferencing for a telehealth visit today.      Just did a rapid Covid test and it was negative. She thinks she has a really bad sinus infection. When she blows her nose, there is blood in the mucus, the cough comes and goes, there is a lot of congestion. The mucus is yellow or green. Symptoms started 2 days. She has already done the swab test this morning and waiting for her results to come back. She has some body aches. She is not having any  SOB or chest pain at this time. She does have a sore throat. She has a headache. She has only taken Tylenol.  She has not taken her temperature today. She has had some chills. She might of been exposed to COVID and at work or from a friend who was positive last week. She has had both her covid vaccine but not had her booster shot yet.      Past Medical History:  Diagnosis Date  . Anxiety    no current med.  . Cervix prolapsed into vagina   . Chronic headaches    otc med prn  . Complication of anesthesia    narrow airway and had difficulty breathing after cholecystectomy  . Constipation   . Depression    no meds  currently  . Diabetes mellitus without complication (HCC)    type 2  . Gall stones    Resolved with surgery  . GERD (gastroesophageal reflux disease)   . Gestational diabetes   . HPV in female   . Jaw snapping    states jaw pops if opens mouth too wide  . Ovarian cyst   . PCOS (polycystic ovarian syndrome)   . Pilonidal cyst 02/2013  . Preterm labor 2016   PPROM @ 18 wks, delivery of IUFD @ 21 wks  . Seasonal allergies   . SVD (spontaneous vaginal delivery)    x 2 - both fetal demises 2nd trim.  . Vaginal Pap smear, abnormal    Past Surgical History:  Procedure Laterality Date  . CERVICAL CERCLAGE N/A 01/21/2018   Procedure: CERCLAGE CERVICAL;  Surgeon: Lesly Dukes, MD;  [facility];  Service: Gynecology;  Laterality: N/A;  . CERVICAL CERCLAGE  07/09/2018   Procedure: CERCLAGE CERVICAL removal;  Surgeon: Tereso Newcomer, MD;  Location: [facility];  Service: Obstetrics;;  after c-section cerclage removed   . CESAREAN SECTION N/A 07/09/2018   Procedure: CESAREAN SECTION;  Surgeon: Tereso Newcomer, MD;  Location: [facility];  Service: Obstetrics;  Laterality: N/A;  . CHOLECYSTECTOMY N/A 07/21/2016   Procedure: LAPAROSCOPIC CHOLECYSTECTOMY;  Surgeon: Abigail Miyamoto, MD;  Location: [facility] OR;  Service: General;  Laterality: N/A;  . PILONIDAL CYST EXCISION N/A 03/12/2013   Procedure: CYST EXCISION PILONIDAL EXTENSIVE;  Surgeon: Shelly Rubenstein, MD;  Location: [facility];  Service: General;  Laterality: N/A;  . WISDOM TOOTH EXTRACTION       No outpatient medications have been marked as taking for the 06/24/20 encounter (Appointment) with Charlesetta Ivory, NP.     Allergies:   Lactose intolerance (gi)   Social History   Tobacco Use  . Smoking status: Current Some Day Smoker    Packs/day: 0.25    Years: 13.00    Pack years: 3.25    Types: Cigarettes    Start date: 2008  . Smokeless tobacco: Never Used  Vaping Use  .  Vaping Use: Never used  Substance Use Topics  . Alcohol use: No    Comment: socially but none with pregnancy  . Drug use: Not Currently    Types: Hydrocodone    Comment: prescribed this medication     Family Hx: The patient's family history includes Alcohol abuse in her father; Breast cancer in her maternal aunt; Diabetes in her father and mother; Hyperlipidemia in her mother; Hypertension in her father and mother; Pancreatic cancer in her paternal grandfather; Thyroid disease in her mother.  ROS:   Please see the history of present illness.  Review of Systems  Constitutional: Positive for chills and malaise/fatigue.  HENT: Positive for congestion and sore throat.   Respiratory: Positive for cough. Negative for shortness of breath and wheezing.   Cardiovascular: Negative for chest pain.  Gastrointestinal: Negative for diarrhea and nausea.  Musculoskeletal: Positive for myalgias.  Neurological: Positive for headaches.    All other systems reviewed and are negative.   Labs/Other Tests and Data Reviewed:    Recent Labs: 08/10/2019: ALT 31; BUN 9; Creatinine, Ser 0.87; Hemoglobin 14.4; Platelets 215; Potassium 4.2; Sodium 140; TSH 0.965   Recent Lipid Panel Lab Results  Component Value Date/Time   CHOL 168 12/24/2019 12:00 AM   CHOL 172 08/10/2019 11:30 AM   TRIG 150 12/24/2019 12:00 AM   HDL 46 12/24/2019 12:00 AM   HDL 47 08/10/2019 11:30 AM   CHOLHDL 3.7 08/10/2019 11:30 AM   CHOLHDL 3 08/08/2015 03:25 PM   LDLCALC 96 12/24/2019 12:00 AM   LDLCALC 101 (H) 08/10/2019 11:30 AM    Wt Readings from Last 3 Encounters:  05/11/20 282 lb (127.9 kg)  04/25/20 288 lb (130.6 kg)  04/17/20 284 lb (128.8 kg)     Exam:    Vital Signs:  There were no vitals taken for this visit.    Physical Exam Vitals and nursing note reviewed.  HENT:     Head: Normocephalic and atraumatic.  Pulmonary:     Effort: Pulmonary effort is normal.  Neurological:     Mental Status: She is  alert and oriented to person, place, and time.  Psychiatric:        Mood and Affect: Affect normal.     ASSESSMENT & PLAN:    1) Close exposure to COVID-19 virus  -Patient possibly exposed at work  -Rapid test was negative, waiting for PCR to come back  -Advised patient to take Vitamin C, D, Zinc.  - Keep yourself hydrated with a lot of water and rest. -Take Delsym for cough and Mucinex.  -Take Tylenol or pain reliever every 4-6 hours as needed for pain/fever/body ache. - Educated patient if symptoms get worse or if she experiences any SOB or pain in her legs to seek immediate emergency care. - Continue to monitor your pulse oxygen. Call us if you have any questions.  -Quarantine for 5 days if tested positive and no symptoms or 10 days if tested positive and have symptoms. Wear a mask around other people.  -Follow up with PCP   2) Chest congestion  - Prescription for Z-pak to pharmacy  -Educated patient to monitor her pulse ox and if she experiences any SOB or chest pain to seek emergency care.  -Follow up with PCP.  -Take OTC Mucinex as needed  -Flonase or Nasacort as needed.   A note for work for the patient has been sent to her mychart.    COVID-19 Education: The signs and symptoms of COVID-19 were discussed with the patient and how to seek care for testing (follow up with PCP or arrange E-visit).  The importance of social distancing was discussed today.  Patient Risk:   After full review of this patients clinical status, I feel that they are at least moderate risk at this time.  Time:   Today, I have spent minutes 20 with the patient with telehealth technology discussing above diagnoses.     Medication Adjustments/Labs and Tests Ordered: Current medicines are reviewed at length with the patient today.  Concerns regarding medicines are outlined above.   Tests Ordered:  No orders of the defined types were placed in this encounter.   Medication Changes: No orders of the  defined types were placed in this encounter.   Disposition:  Follow up if symptoms get worse. If she experiences any SOB or chest pain to seek emergency care.   Signed, Charlesetta Ivory, NP

## 2020-06-26 LAB — SARS-COV-2, NAA 2 DAY TAT

## 2020-06-26 LAB — NOVEL CORONAVIRUS, NAA: SARS-CoV-2, NAA: NOT DETECTED

## 2020-06-26 NOTE — Addendum Note (Signed)
Addended by: Alinda Dooms on: 06/26/2020 06:59 PM   Modules accepted: Level of Service

## 2020-07-21 ENCOUNTER — Other Ambulatory Visit: Payer: Self-pay | Admitting: Family Medicine

## 2020-07-21 DIAGNOSIS — Z720 Tobacco use: Secondary | ICD-10-CM

## 2020-07-27 ENCOUNTER — Other Ambulatory Visit: Payer: Self-pay

## 2020-07-27 ENCOUNTER — Encounter (HOSPITAL_BASED_OUTPATIENT_CLINIC_OR_DEPARTMENT_OTHER): Payer: Self-pay

## 2020-07-27 ENCOUNTER — Emergency Department (HOSPITAL_BASED_OUTPATIENT_CLINIC_OR_DEPARTMENT_OTHER): Payer: BC Managed Care – PPO

## 2020-07-27 ENCOUNTER — Emergency Department (HOSPITAL_BASED_OUTPATIENT_CLINIC_OR_DEPARTMENT_OTHER)
Admission: EM | Admit: 2020-07-27 | Discharge: 2020-07-27 | Disposition: A | Discharge status: Home / Self Care | Payer: BC Managed Care – PPO | Attending: Emergency Medicine | Admitting: Emergency Medicine

## 2020-07-27 DIAGNOSIS — F17210 Nicotine dependence, cigarettes, uncomplicated: Secondary | ICD-10-CM | POA: Insufficient documentation

## 2020-07-27 DIAGNOSIS — S99912A Unspecified injury of left ankle, initial encounter: Secondary | ICD-10-CM | POA: Diagnosis not present

## 2020-07-27 DIAGNOSIS — M7989 Other specified soft tissue disorders: Secondary | ICD-10-CM | POA: Diagnosis not present

## 2020-07-27 DIAGNOSIS — S93402A Sprain of unspecified ligament of left ankle, initial encounter: Secondary | ICD-10-CM | POA: Diagnosis not present

## 2020-07-27 DIAGNOSIS — W1842XA Slipping, tripping and stumbling without falling due to stepping into hole or opening, initial encounter: Secondary | ICD-10-CM | POA: Insufficient documentation

## 2020-07-27 DIAGNOSIS — E119 Type 2 diabetes mellitus without complications: Secondary | ICD-10-CM | POA: Diagnosis not present

## 2020-07-27 NOTE — Discharge Instructions (Signed)
You were seen in the emergency department today with pain in the left ankle likely from a sprain.  I am referring you to sports medicine physician.  Please continue with ibuprofen and/or Tylenol alternating for pain.  You can wrap the ankle for some compression if that gives you relief.  Return to the emergency department any ankle redness, fevers, sudden/severe pain or repeat injury.

## 2020-07-27 NOTE — ED Triage Notes (Signed)
Pt arrives complaining of left ankle pain since stepping in a hole a few weeks ago. Now has swelling. Ambulatory to triage room without difficulty

## 2020-07-27 NOTE — ED Provider Notes (Signed)
Emergency Department Provider Note   I have reviewed the triage vital signs and the nursing notes.   HISTORY  Chief Complaint Ankle Pain   HPI Traci Peterson is a 35 y.o. female with past medical history reviewed below presents to the emergency department  with several weeks of left ankle pain.  She states that she stepped in a hole and twisted the ankle weeks ago.  She had some swelling and pain mainly over the lateral aspect of the left ankle.  She been managing at home and ambulating with a slight limp but overall doing okay.  She notes some continued localized swelling over the lateral aspect of the left ankle. No fever. No redness.  With continued symptoms she presents to the ED for evaluation.   Past Medical History:  Diagnosis Date  . Anxiety    no current med.  . Cervix prolapsed into vagina   . Chronic headaches    otc med prn  . Complication of anesthesia    narrow airway and had difficulty breathing after cholecystectomy  . Constipation   . Depression    no meds currently  . Diabetes mellitus without complication (HCC)    type 2  . Gall stones    Resolved with surgery  . GERD (gastroesophageal reflux disease)   . Gestational diabetes   . HPV in female   . Jaw snapping    states jaw pops if opens mouth too wide  . Ovarian cyst   . PCOS (polycystic ovarian syndrome)   . Pilonidal cyst 02/2013  . Preterm labor 2016   PPROM @ 18 wks, delivery of IUFD @ 21 wks  . Seasonal allergies   . SVD (spontaneous vaginal delivery)    x 2 - both fetal demises 2nd trim.  . Vaginal Pap smear, abnormal     Patient Active Problem List   Diagnosis Date Noted  . Prediabetes 08/11/2019  . Breast pain, right 07/23/2018  . Insulin controlled gestational diabetes mellitus (GDM) during pregnancy 05/08/2018  . Heartburn in pregnancy 02/13/2018  . Trichomonal vaginitis during pregnancy 01/20/2018  . Supervision of high risk pregnancy, antepartum 01/01/2018  . Pre-existing  type 2 diabetes mellitus during pregnancy, antepartum 12/16/2017  . Vitamin D deficiency 12/10/2017  . MDD (major depressive disorder), recurrent episode, severe (HCC) 05/29/2017  . Incompetent cervix 09/12/2016  . Gall stones 07/19/2016  . Family history of breast cancer in female 06/06/2016  . Nexplanon insertion 08/08/2015  . Obesity in pregnancy 10/25/2014  . Eczema 10/25/2014  . 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 Chronic headaches -  no meds 10/25/2014  . Anxiety and depression 11/30/2013  . PCOS (polycystic ovarian syndrome) 04/20/2013    Past Surgical History:  Procedure Laterality Date  . CERVICAL CERCLAGE N/A 01/21/2018   Procedure: CERCLAGE CERVICAL;  Surgeon: Lesly Dukes, MD;  Location: [facility]ING SUIT[facility];  Service: Gynecology;  Laterality: N/A;  . CERVICAL CERCLAGE  07/09/2018   Procedure: CERCLAGE CERVICAL removal;  Surgeon: Tereso Newcomer, MD;  Location: [facility] BIRTHING SUITES;  Service: Obstetrics;;  after c-section cerclage removed   . CESAREAN SECTION N/A 07/09/2018   Procedure: CESAREAN SECTION;  Surgeon: Tereso Newcomer, MD;  Location: [facility] BIRTHING SUITES;  Service: Obstetrics;  Laterality: N/A;  . CHOLECYSTECTOMY N/A 07/21/2016   Procedure: LAPAROSCOPIC CHOLECYSTECTOMY;  Surgeon: Abigail Miyamoto, MD;  Location: [facility] OR;  Service: General;  Laterality: N/A;  .  PILONIDAL CYST EXCISION N/A 03/12/2013   Procedure: CYST EXCISION PILONIDAL EXTENSIVE;  Surgeon: Shelly Rubenstein, MD;  Location: [facility];  Service: General;  Laterality: N/A;  . WISDOM TOOTH EXTRACTION      Allergies Lactose intolerance (gi)  Family History  Problem Relation Age of Onset  . Hypertension Mother   . Hyperlipidemia Mother   . Diabetes Mother   . Thyroid disease Mother   . Diabetes Father   . Hypertension Father   . Alcohol abuse Father   . Breast cancer Maternal Aunt   . Pancreatic  cancer Paternal Grandfather     Social History Social History   Tobacco Use  . Smoking status: Current Some Day Smoker    Packs/day: 0.25    Years: 13.00    Pack years: 3.25    Types: Cigarettes    Start date: 2008  . Smokeless tobacco: Never Used  Vaping Use  . Vaping Use: Never used  Substance Use Topics  . Alcohol use: No    Comment: socially but none with pregnancy  . Drug use: Not Currently    Types: Hydrocodone    Comment: prescribed this medication    Review of Systems  Constitutional: No fever/chills Musculoskeletal: Negative for back pain. Positive left ankle pain.  Skin: Negative for rash. Neurological: Negative for headaches.  ____________________________________________   PHYSICAL EXAM:  VITAL SIGNS: ED Triage Vitals  Enc Vitals Group     BP 07/27/20 1527 (!) 143/88     Pulse Rate 07/27/20 1527 (!) 106     Resp 07/27/20 1527 18     Temp 07/27/20 1531 98.1 F (36.7 C)     Temp Source 07/27/20 1531 Oral     SpO2 07/27/20 1527 99 %     Weight 07/27/20 1527 300 lb (136.1 kg)     Height 07/27/20 1527 5\' 2"  (1.575 m)   Constitutional: Alert and oriented. Well appearing and in no acute distress. Eyes: Conjunctivae are normal.  Head: Atraumatic. Nose: No congestion/rhinnorhea. Mouth/Throat: Mucous membranes are moist.   Neck: No stridor.  Cardiovascular: Good peripheral circulation.  Respiratory: Normal respiratory effort.  Gastrointestinal: No distention.  Musculoskeletal: Mild tenderness over the left lateral malleolus.  No skin breakdown, bruising, cellulitis.  No joint warmth or redness.  No proximal fibular tenderness. Neurologic:  Normal speech and language. No gross focal neurologic deficits are appreciated.  Skin:  Skin is warm, dry and intact. No rash noted.  ____________________________________________  RADIOLOGY  DG Ankle Complete Left  Result Date: 07/27/2020 CLINICAL DATA:  LEFT ankle injury 1 month ago, pain and swelling at lateral  malleolus. EXAM: LEFT ANKLE COMPLETE - 3+ VIEW COMPARISON:  None. FINDINGS: Osseous alignment is normal. Ankle mortise is symmetric. No fracture line or displaced fracture fragment. Visualized portions of the hindfoot and midfoot are unremarkable. Soft tissues about the LEFT ankle are unremarkable. No degenerative change. IMPRESSION: Negative. Electronically Signed   By: 09/26/2020 M.D.   On: 07/27/2020 16:11    ____________________________________________   PROCEDURES  Procedure(s) performed:   Procedures  None  ____________________________________________   INITIAL IMPRESSION / ASSESSMENT AND PLAN / ED COURSE  Pertinent labs & imaging results that were available during my care of the patient were reviewed by me and considered in my medical decision making (see chart for details).   Presents emergency department with left ankle pain after injury several weeks ago.  X-ray today shows no acute osseous abnormality.  Patient is having persistent symptoms.  Exam is not consistent with septic joint.  No proximal fibular tenderness to suspect associated fracture in that location.  Plan for referral to sports medicine.  Discussed RICE therapy at home.    ____________________________________________  FINAL CLINICAL IMPRESSION(S) / ED DIAGNOSES  Final diagnoses:  Sprain of left ankle, unspecified ligament, initial encounter    Note:  This document was prepared using Dragon voice recognition software and may include unintentional dictation errors.  Alona Bene, MD, [facility] Emergency Medicine    Delesha Pohlman, Arlyss Repress, MD 07/27/20 [phone]

## 2022-07-10 ENCOUNTER — Other Ambulatory Visit: Payer: Self-pay

## 2022-07-10 ENCOUNTER — Emergency Department (HOSPITAL_BASED_OUTPATIENT_CLINIC_OR_DEPARTMENT_OTHER)
Admission: EM | Admit: 2022-07-10 | Discharge: 2022-07-10 | Disposition: A | Discharge status: Home / Self Care | Payer: Managed Care, Other (non HMO) | Attending: Emergency Medicine | Admitting: Emergency Medicine

## 2022-07-10 ENCOUNTER — Encounter (HOSPITAL_BASED_OUTPATIENT_CLINIC_OR_DEPARTMENT_OTHER): Payer: Self-pay

## 2022-07-10 DIAGNOSIS — L0501 Pilonidal cyst with abscess: Secondary | ICD-10-CM | POA: Insufficient documentation

## 2022-07-10 DIAGNOSIS — L0591 Pilonidal cyst without abscess: Secondary | ICD-10-CM

## 2022-07-10 MED ORDER — LIDOCAINE-EPINEPHRINE (PF) 2 %-1:200000 IJ SOLN
10.0000 mL | Freq: Once | INTRAMUSCULAR | Status: AC
  Administered 2022-07-10: 10 mL
  Filled 2022-07-10: qty 20

## 2022-07-10 MED ORDER — HYDROCODONE-ACETAMINOPHEN 5-325 MG PO TABS: 1.0000 | ORAL_TABLET | Freq: Four times a day (QID) | ORAL | 0 refills | Status: AC | PRN

## 2022-07-10 MED ORDER — AMOXICILLIN-POT CLAVULANATE 875-125 MG PO TABS: 1.0000 | ORAL_TABLET | Freq: Two times a day (BID) | ORAL | 0 refills | Status: DC

## 2022-07-10 NOTE — Discharge Instructions (Addendum)
Please follow-up with your PCP in 7 to 10 days for wound reassessment and suture removal.  Recommend daily dressing changes daily.  Some drainage would be expected.  We will treat you with course of Norco and Augmentin for antibiotics, return if you have worsening pain, purulent drainage, fever or chills or any other immediate concerns.  A referral has been placed for follow-up outpatient with general surgery.

## 2022-07-10 NOTE — ED Provider Notes (Signed)
[facility] Provider Note   CSN: [id number] Arrival date & time: 07/10/22  1210     History  Chief Complaint  Patient presents with   Cyst    Traci Peterson is a 37 y.o. female.  HPI   37 year old female with medical history significant for anxiety, depression, constipation, pilonidal cyst requiring surgical incision and drainage who presents to the emergency department with worsening pain in her back in the same vicinity of her prior pilonidal cyst.  Patient states that she underwent I&D a few months ago.  She then was treated with antibiotic.  She states that she has had chronic pain in the vicinity of the scar tissue but acutely worsened over the past few days.  She denies any redness, active drainage.  Denies any fevers or chills.  Endorses mild worsening swelling with associated pain and is worried that she is having a recurrence of infection.  Home Medications Prior to Admission medications   Medication Sig Start Date End Date Taking? Authorizing Provider  amoxicillin-clavulanate (AUGMENTIN) 875-125 MG tablet Take 1 tablet by mouth every 12 (twelve) hours. 07/10/22  Yes Regan Lemming, MD  HYDROcodone-acetaminophen (NORCO/VICODIN) 5-325 MG tablet Take 1-2 tablets by mouth every 6 (six) hours as needed. 07/10/22  Yes Regan Lemming, MD  azithromycin (ZITHROMAX) 250 MG tablet Take 2 tables by mouth on day one and then take one tablet by mouth from days 2-5. 06/24/20   Bary Castilla, NP  buPROPion (WELLBUTRIN SR) 150 MG 12 hr tablet Take 1 tablet (150 mg total) by mouth 2 (two) times daily. 07/21/20   Copland, Gay Filler, MD  clindamycin (CLEOCIN T) 1 % external solution Apply topically 2 (two) times daily. 04/26/20   [provider]  etonogestrel (NEXPLANON) 68 MG IMPL implant 1 each by Subdermal route once.    [provider]  fluocinonide (LIDEX) 0.05 % external solution Apply topically. 05/07/20   [provider]  ketoconazole (NIZORAL) 2 % shampoo Apply topically 3 (three) times a week. 04/21/20   [provider]  norgestimate-ethinyl estradiol (ORTHO-CYCLEN) 0.25-35 MG-MCG tablet Take 1 tablet by mouth daily. 05/11/20   Rasch, Anderson Malta I, NP  terbinafine (LAMISIL) 250 MG tablet Take 1 tablet (250 mg total) by mouth daily. Take daily for 14 days 05/30/20   Copland, Gay Filler, MD  triamcinolone (NASACORT) 55 MCG/ACT AERO nasal inhaler Place 2 sprays into the nose daily. 04/19/20   Rozetta Nunnery, MD      Allergies    Lactose intolerance (gi)    Review of Systems   Review of Systems  All other systems reviewed and are negative.   Physical Exam Updated Vital Signs BP 127/82 (BP Location: Right Arm)   Pulse 93   Temp 97.8 F (36.6 C)   Resp 18   Ht 5' 2"$  (1.575 m)   Wt (!) 136.1 kg   SpO2 98%   BMI 54.88 kg/m  Physical Exam Vitals and nursing note reviewed.  Constitutional:      General: She is not in acute distress.    Appearance: She is well-developed. She is obese.  HENT:     Head: Normocephalic and atraumatic.  Eyes:     Conjunctiva/sclera: Conjunctivae normal.  Cardiovascular:     Rate and Rhythm: Normal rate and regular rhythm.  Pulmonary:     Effort: Pulmonary effort is normal. No respiratory distress.     Breath sounds: Normal breath sounds.  Abdominal:     Palpations:  Abdomen is soft.     Tenderness: There is no abdominal tenderness.  Musculoskeletal:        General: No swelling.     Cervical back: Neck supple.     Comments: Scar tissue and keloid formation present over area of prior I&D about the patient's sacrum, tenderness to palpation, mild fluctuance present, no surrounding erythema, no warmth  Skin:    General: Skin is warm and dry.     Capillary Refill: Capillary refill takes less than 2 seconds.  Neurological:     Mental Status: She is alert.  Psychiatric:        Mood and Affect: Mood normal.     ED Results / Procedures /  Treatments   Labs (all labs ordered are listed, but only abnormal results are displayed) Labs Reviewed - No data to display  EKG None  Radiology No results found.  Procedures .Marland KitchenIncision and Drainage  Date/Time: 07/10/2022 2:05 PM  Performed by: Regan Lemming, MD Authorized by: Regan Lemming, MD   Consent:    Consent obtained:  Verbal   Consent given by:  Patient   Risks discussed:  Bleeding, incomplete drainage, infection and pain   Alternatives discussed:  No treatment Universal protocol:    Patient identity confirmed:  Verbally with patient Location:    Type:  Pilonidal cyst   Size:  2cm   Location:  Trunk   Trunk location:  Back Pre-procedure details:    Skin preparation:  Chlorhexidine Sedation:    Sedation type:  None Anesthesia:    Anesthesia method:  Local infiltration   Local anesthetic:  Lidocaine 1% WITH epi Procedure type:    Complexity:  Simple Procedure details:    Incision types:  Stab incision   Incision depth:  Subcutaneous   Wound management:  Probed and deloculated and irrigated with saline   Drainage:  Serous   Drainage amount:  Scant   Wound treatment: Wound loosely closed with a single silk suture. Post-procedure details:    Procedure completion:  Tolerated     Medications Ordered in ED Medications  lidocaine-EPINEPHrine (XYLOCAINE W/EPI) 2 %-1:200000 (PF) injection 10 mL (10 mLs Infiltration Given 07/10/22 1255)    ED Course/ Medical Decision Making/ A&P                             Medical Decision Making Risk Prescription drug management.    37 year old female with medical history significant for anxiety, depression, constipation, pilonidal cyst requiring surgical incision and drainage who presents to the emergency department with worsening pain in her back in the same vicinity of her prior pilonidal cyst.  Patient states that she underwent I&D a few months ago.  She then was treated with antibiotic.  She states that she has had  chronic pain in the vicinity of the scar tissue but acutely worsened over the past few days.  She denies any redness, active drainage.  Denies any fevers or chills.  Endorses mild worsening swelling with associated pain and is worried that she is having a recurrence of infection.  On arrival, the patient was vitally stable.  Physical exam significant for tenderness to palpation over the patient's scar tissue from her prior pilonidal cyst I&D no surrounding edema to suggest cellulitis.  Some concern for recurrence of infection,   I&D performed as per the procedure note above with no purulence expressed. Pt appears to have scar tissue with no evidence of abscess on I&D.  The incision was subsequently closed with a single suture as per the procedure note above.  Patient was referred to outpatient general surgery for follow-up.  Patient has screened negative by PCR for MRSA per chart review previously.  Will discharge on Norco and Augmentin, infectious return precautions provided.  Advised that the patient follow-up outpatient within 7 to 10 days for wound recheck and suture removal.  Final Clinical Impression(s) / ED Diagnoses Final diagnoses:  Pilonidal cyst without abscess    Rx / DC Orders ED Discharge Orders          Ordered    Ambulatory referral to General Surgery        07/10/22 1409    amoxicillin-clavulanate (AUGMENTIN) 875-125 MG tablet  Every 12 hours        07/10/22 1413    HYDROcodone-acetaminophen (NORCO/VICODIN) 5-325 MG tablet  Every 6 hours PRN        07/10/22 1413              Regan Lemming, MD 07/10/22 1414

## 2022-07-10 NOTE — ED Notes (Signed)
Patient changing into gown 

## 2022-07-10 NOTE — ED Triage Notes (Signed)
Patient here POV from Home.  Endorses Cyst to Coccyx Region. Present for a few months. No recent Drainage. No Fevers.   History of HS and Pilonidal Cyst.   NAD Noted during Triage. A&Ox4. GCS 15. Ambulatory.

## 2022-07-11 ENCOUNTER — Ambulatory Visit
Admission: EM | Admit: 2022-07-11 | Discharge: 2022-07-11 | Disposition: A | Discharge status: Home / Self Care | Payer: Managed Care, Other (non HMO)

## 2022-07-11 DIAGNOSIS — J069 Acute upper respiratory infection, unspecified: Secondary | ICD-10-CM

## 2022-07-11 DIAGNOSIS — H1031 Unspecified acute conjunctivitis, right eye: Secondary | ICD-10-CM | POA: Diagnosis not present

## 2022-07-11 DIAGNOSIS — H6503 Acute serous otitis media, bilateral: Secondary | ICD-10-CM

## 2022-07-11 MED ORDER — POLYMYXIN B-TRIMETHOPRIM 10000-0.1 UNIT/ML-% OP SOLN: 1.0000 [drp] | OPHTHALMIC | 0 refills | Status: AC

## 2022-07-11 MED ORDER — PSEUDOEPHEDRINE HCL 30 MG PO TABS: 30.0000 mg | ORAL_TABLET | Freq: Two times a day (BID) | ORAL | 0 refills | Status: AC

## 2022-07-11 NOTE — ED Provider Notes (Signed)
[facility]    CSN: [id number] Arrival date & time: 07/11/22  1312      History   Chief Complaint Chief Complaint  Patient presents with   Eye Pain   Sore Throat    HPI Traci Peterson is a 37 y.o. female.   37 year old female presents with congestion and right eye irritation.  Patient indicates for the past several days she has been having upper respiratory congestion with rhinitis, postnasal drip, intermittent sore throat.  She indicates the production has been clear to yellow.  She also indicates having bilateral ear pain, pressure, and discomfort for the past several days.  She indicates she has had chest congestion with intermittent cough with production being clear to yellow.  She is without wheezing or shortness of breath.  Patient also indicates that she is not having fever, chills, body aches or pain.  She relates for the past couple days she has been having right eye irritation, redness, and intermittent drainage.  She relates that this started after she was picking her eyebrows which is a nervous habit.  She indicates she may have scratched the cornea while she was pulling at the eyebrows.  She indicates that the vision is normal in the right eye, and there is no other indication of any trauma.   Eye Pain  Sore Throat    Past Medical History:  Diagnosis Date   Anxiety    no current med.   Cervix prolapsed into vagina    Chronic headaches    otc med prn   Complication of anesthesia    narrow airway and had difficulty breathing after cholecystectomy   Constipation    Depression    no meds currently   Diabetes mellitus without complication ([place])    type 2   Gall stones    Resolved with surgery   GERD (gastroesophageal reflux disease)    Gestational diabetes    HPV in female    Jaw snapping    states jaw pops if opens mouth too wide   Ovarian cyst    PCOS (polycystic ovarian syndrome)    Pilonidal cyst 02/2013   Preterm labor 2016   PPROM @  18 wks, delivery of IUFD @ 21 wks   Seasonal allergies    SVD (spontaneous vaginal delivery)    x 2 - both fetal demises 2nd trim.   Vaginal Pap smear, abnormal     Patient Active Problem List   Diagnosis Date Noted   Prediabetes 08/11/2019   Breast pain, right 07/23/2018   Insulin controlled gestational diabetes mellitus (GDM) during pregnancy 05/08/2018   Heartburn in pregnancy 02/13/2018   Trichomonal vaginitis during pregnancy 01/20/2018   Supervision of high risk pregnancy, antepartum 01/01/2018   Pre-existing type 2 diabetes mellitus during pregnancy, antepartum 12/16/2017   Vitamin D deficiency 12/10/2017   MDD (major depressive disorder), recurrent episode, severe ([place]) 05/29/2017   Incompetent cervix 09/12/2016   Gall stones 07/19/2016   Family history of breast cancer in female 06/06/2016   Nexplanon insertion 08/08/2015   Obesity in pregnancy 10/25/2014   Eczema 10/25/2014   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 -- orange, yellow 6 and red food colorings - moderate to severe respiratory distress 10/25/2014   Chronic headaches -  no meds 10/25/2014   Anxiety and depression 11/30/2013   PCOS (polycystic ovarian syndrome) 04/20/2013    Past Surgical History:  Procedure Laterality Date   CERVICAL CERCLAGE N/A 01/21/2018   Procedure: CERCLAGE CERVICAL;  Surgeon: Guss Bunde,  MD;  Location: [place];  Service: Gynecology;  Laterality: N/A;   CERVICAL CERCLAGE  07/09/2018   Procedure: CERCLAGE CERVICAL removal;  Surgeon: Osborne Oman, MD;  Location: [place];  Service: Obstetrics;;  after c-section cerclage removed    CESAREAN SECTION N/A 07/09/2018   Procedure: CESAREAN SECTION;  Surgeon: Osborne Oman, MD;  Location: [place];  Service: Obstetrics;  Laterality: N/A;   CHOLECYSTECTOMY N/A 07/21/2016   Procedure: LAPAROSCOPIC CHOLECYSTECTOMY;  Surgeon: Coralie Keens, MD;  Location: [place];  Service: General;  Laterality: N/A;   PILONIDAL CYST  EXCISION N/A 03/12/2013   Procedure: CYST EXCISION PILONIDAL EXTENSIVE;  Surgeon: Harl Bowie, MD;  Location: [place];  Service: General;  Laterality: N/A;   WISDOM TOOTH EXTRACTION      OB History     Gravida  3   Para  3   Term  0   Preterm  3   AB  0   Living  0      SAB  0   IAB  0   Ectopic  0   Multiple  1   Live Births  3            Home Medications    Prior to Admission medications   Medication Sig Start Date End Date Taking? Authorizing Provider  amoxicillin-clavulanate (AUGMENTIN XR) 1000-62.5 MG 12 hr tablet Take 2 tablets by mouth 2 (two) times daily.   Yes [provider]  pseudoephedrine (SUDAFED) 30 MG tablet Take 1 tablet (30 mg total) by mouth 2 (two) times daily. 07/11/22  Yes Nyoka Lint, PA-C  sertraline (ZOLOFT) 100 MG tablet Take 100 mg by mouth daily.   Yes [provider]  trimethoprim-polymyxin b (POLYTRIM) ophthalmic solution Place 1 drop into the right eye every 4 (four) hours. 07/11/22  Yes Nyoka Lint, PA-C  clindamycin (CLEOCIN T) 1 % external solution Apply topically 2 (two) times daily. 04/26/20   [provider]  etonogestrel (NEXPLANON) 68 MG IMPL implant 1 each by Subdermal route once.    [provider]  fluocinonide (LIDEX) 0.05 % external solution Apply topically. 05/07/20   [provider]  HYDROcodone-acetaminophen (NORCO/VICODIN) 5-325 MG tablet Take 1-2 tablets by mouth every 6 (six) hours as needed. 07/10/22   Regan Lemming, MD  ketoconazole (NIZORAL) 2 % shampoo Apply topically 3 (three) times a week. 04/21/20   [provider]  triamcinolone (NASACORT) 55 MCG/ACT AERO nasal inhaler Place 2 sprays into the nose daily. 04/19/20   Rozetta Nunnery, MD    Family History Family History  Problem Relation Age of Onset   Hypertension Mother    Hyperlipidemia Mother    Diabetes Mother    Thyroid disease Mother    Diabetes Father     Hypertension Father    Alcohol abuse Father    Breast cancer Maternal Aunt    Pancreatic cancer Paternal Grandfather     Social History Social History   Tobacco Use   Smoking status: Some Days    Packs/day: 0.25    Years: 13.00    Total pack years: 3.25    Types: Cigarettes    Start date: 2008   Smokeless tobacco: Never  Vaping Use   Vaping Use: Never used  Substance Use Topics   Alcohol use: Yes    Comment: socially but none with pregnancy   Drug use: Not Currently    Types: Hydrocodone    Comment: prescribed this  medication     Allergies   Lactose intolerance (gi)   Review of Systems Review of Systems  HENT:  Positive for postnasal drip, rhinorrhea and sinus pressure.   Eyes:  Positive for pain (right eye).     Physical Exam Triage Vital Signs ED Triage Vitals [07/11/22 1435]  Enc Vitals Group     BP (!) 148/76     Pulse Rate 97     Resp 18     Temp 98 F (36.7 C)     Temp Source Oral     SpO2 95 %     Weight      Height      Head Circumference      Peak Flow      Pain Score 6     Pain Loc      Pain Edu?      Excl. in [place]?    No data found.  Updated Vital Signs BP (!) 148/76 (BP Location: Left Arm)   Pulse 97   Temp 98 F (36.7 C) (Oral)   Resp 18   SpO2 95%   Visual Acuity Right Eye Distance:   Left Eye Distance:   Bilateral Distance:    Right Eye Near:   Left Eye Near:    Bilateral Near:     Physical Exam Constitutional:      Appearance: She is well-developed.  HENT:     Right Ear: Ear canal normal. Tympanic membrane is erythematous.     Left Ear: Ear canal normal. Tympanic membrane is erythematous.     Mouth/Throat:     Mouth: Mucous membranes are moist.     Pharynx: Oropharynx is clear. No posterior oropharyngeal erythema.  Eyes:     General: Lids are normal. Lids are everted, no foreign bodies appreciated.     Extraocular Movements: Extraocular movements intact.     Conjunctiva/sclera:     Right eye: Right conjunctiva is  injected (mild redness present).     Comments: [place]ora stain: There is no evidence of corneal abrasion or foreign bodies on staining and reviewing under fluorescent light.  Cardiovascular:     Rate and Rhythm: Normal rate and regular rhythm.     Heart sounds: Normal heart sounds.  Pulmonary:     Effort: Pulmonary effort is normal.     Breath sounds: Normal breath sounds and air entry. No wheezing, rhonchi or rales.  Neurological:     Mental Status: She is alert.      UC Treatments / Results  Labs (all labs ordered are listed, but only abnormal results are displayed) Labs Reviewed - No data to display  EKG   Radiology No results found.  Procedures Procedures (including critical care time)  Medications Ordered in UC Medications - No data to display  Initial Impression / Assessment and Plan / UC Course  I have reviewed the triage vital signs and the nursing notes.  Pertinent labs & imaging results that were available during my care of the patient were reviewed by me and considered in my medical decision making (see chart for details).    Plan: The diagnosis will be treated with the following: 1.  Upper respiratory tract infection: A.  Sudafed every 12 hours for congestion. 2.  Otitis media bilaterally: A.  Advised the patient to continue the Augmentin 875 mg that she received for an abscess.  The patient will start this medication today. 3.  Conjunctivitis right eye: A.  Polytrim eyedrops, 1 drop every 6  hours for the next couple days to resolve the irritation. 4.  Patient advised follow-up PCP or return to urgent care as needed. Final Clinical Impressions(s) / UC Diagnoses   Final diagnoses:  Acute upper respiratory infection  Non-recurrent acute serous otitis media of both ears  Acute bacterial conjunctivitis of right eye     Discharge Instructions      Advised take Sudafed tablets every 12 hours to help with congestion. Advised to use the Polytrim eyedrops, 1  drop in the eye 4 times a day until the condition clears. Advised take the antibiotic that was prescribed for the abscess as this will help to cover the ear infection.  Advised take Tylenol or ibuprofen for discomfort and pain as needed.  Advised follow-up PCP or return to urgent care as needed.    ED Prescriptions     Medication Sig Dispense Auth. Provider   trimethoprim-polymyxin b (POLYTRIM) ophthalmic solution Place 1 drop into the right eye every 4 (four) hours. 10 mL Nyoka Lint, PA-C   pseudoephedrine (SUDAFED) 30 MG tablet Take 1 tablet (30 mg total) by mouth 2 (two) times daily. 20 tablet Nyoka Lint, PA-C      PDMP not reviewed this encounter.   Nyoka Lint, PA-C 07/11/22 1514

## 2022-07-11 NOTE — Discharge Instructions (Signed)
Advised take Sudafed tablets every 12 hours to help with congestion. Advised to use the Polytrim eyedrops, 1 drop in the eye 4 times a day until the condition clears. Advised take the antibiotic that was prescribed for the abscess as this will help to cover the ear infection.  Advised take Tylenol or ibuprofen for discomfort and pain as needed.  Advised follow-up PCP or return to urgent care as needed.

## 2022-07-11 NOTE — ED Triage Notes (Signed)
Pt c/o rt eye redness, irritation, and drainage x2 days. States feels swollen.   Pt c/o scratchy throat and popping to both ears since Friday.
